# Patient Record
Sex: Female | Born: 1935 | Race: White | Hispanic: No | Smoking: Former smoker
Health system: Southern US, Community
[De-identification: ages and names within clinical notes are randomized; demographics above are authoritative.]

## PROBLEM LIST (undated history)

## (undated) DIAGNOSIS — I429 Cardiomyopathy, unspecified: Secondary | ICD-10-CM

## (undated) DIAGNOSIS — M1990 Unspecified osteoarthritis, unspecified site: Secondary | ICD-10-CM

## (undated) DIAGNOSIS — Z9289 Personal history of other medical treatment: Secondary | ICD-10-CM

## (undated) DIAGNOSIS — M545 Low back pain, unspecified: Secondary | ICD-10-CM

## (undated) DIAGNOSIS — I10 Essential (primary) hypertension: Secondary | ICD-10-CM

## (undated) DIAGNOSIS — G5600 Carpal tunnel syndrome, unspecified upper limb: Secondary | ICD-10-CM

## (undated) DIAGNOSIS — F329 Major depressive disorder, single episode, unspecified: Secondary | ICD-10-CM

## (undated) DIAGNOSIS — S329XXA Fracture of unspecified parts of lumbosacral spine and pelvis, initial encounter for closed fracture: Secondary | ICD-10-CM

## (undated) DIAGNOSIS — E785 Hyperlipidemia, unspecified: Secondary | ICD-10-CM

## (undated) DIAGNOSIS — M810 Age-related osteoporosis without current pathological fracture: Secondary | ICD-10-CM

## (undated) DIAGNOSIS — D649 Anemia, unspecified: Secondary | ICD-10-CM

## (undated) DIAGNOSIS — S72009A Fracture of unspecified part of neck of unspecified femur, initial encounter for closed fracture: Secondary | ICD-10-CM

## (undated) DIAGNOSIS — I2510 Atherosclerotic heart disease of native coronary artery without angina pectoris: Secondary | ICD-10-CM

## (undated) DIAGNOSIS — I5020 Unspecified systolic (congestive) heart failure: Secondary | ICD-10-CM

## (undated) DIAGNOSIS — F3289 Other specified depressive episodes: Secondary | ICD-10-CM

## (undated) DIAGNOSIS — M4800 Spinal stenosis, site unspecified: Secondary | ICD-10-CM

## (undated) DIAGNOSIS — I4891 Unspecified atrial fibrillation: Secondary | ICD-10-CM

## (undated) DIAGNOSIS — J309 Allergic rhinitis, unspecified: Secondary | ICD-10-CM

## (undated) DIAGNOSIS — I6529 Occlusion and stenosis of unspecified carotid artery: Secondary | ICD-10-CM

## (undated) DIAGNOSIS — K219 Gastro-esophageal reflux disease without esophagitis: Secondary | ICD-10-CM

## (undated) HISTORY — DX: Age-related osteoporosis without current pathological fracture: M81.0

## (undated) HISTORY — DX: Unspecified atrial fibrillation: I48.91

## (undated) HISTORY — DX: Anemia, unspecified: D64.9

## (undated) HISTORY — DX: Unspecified osteoarthritis, unspecified site: M19.90

## (undated) HISTORY — DX: Low back pain: M54.5

## (undated) HISTORY — DX: Other specified depressive episodes: F32.89

## (undated) HISTORY — PX: EYE SURGERY: SHX253

## (undated) HISTORY — DX: Allergic rhinitis, unspecified: J30.9

## (undated) HISTORY — DX: Fracture of unspecified parts of lumbosacral spine and pelvis, initial encounter for closed fracture: S32.9XXA

## (undated) HISTORY — DX: Cardiomyopathy, unspecified: I42.9

## (undated) HISTORY — DX: Major depressive disorder, single episode, unspecified: F32.9

## (undated) HISTORY — DX: Fracture of unspecified part of neck of unspecified femur, initial encounter for closed fracture: S72.009A

## (undated) HISTORY — DX: Atherosclerotic heart disease of native coronary artery without angina pectoris: I25.10

## (undated) HISTORY — PX: VEIN LIGATION: SHX2652

## (undated) HISTORY — DX: Personal history of other medical treatment: Z92.89

## (undated) HISTORY — DX: Low back pain, unspecified: M54.50

## (undated) HISTORY — DX: Carpal tunnel syndrome, unspecified upper limb: G56.00

## (undated) HISTORY — DX: Gastro-esophageal reflux disease without esophagitis: K21.9

## (undated) HISTORY — DX: Spinal stenosis, site unspecified: M48.00

## (undated) HISTORY — DX: Hyperlipidemia, unspecified: E78.5

## (undated) HISTORY — DX: Occlusion and stenosis of unspecified carotid artery: I65.29

## (undated) HISTORY — PX: COLONOSCOPY: SHX174

## (undated) HISTORY — DX: Unspecified systolic (congestive) heart failure: I50.20

---
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
## 2002-07-10 DIAGNOSIS — I2510 Atherosclerotic heart disease of native coronary artery without angina pectoris: Secondary | ICD-10-CM

## 2002-07-10 HISTORY — PX: CORONARY ARTERY BYPASS GRAFT: SHX141

## 2002-07-10 HISTORY — DX: Atherosclerotic heart disease of native coronary artery without angina pectoris: I25.10

## 2002-07-10 HISTORY — PX: CARPAL TUNNEL RELEASE: SHX101

## 2002-07-14 ENCOUNTER — Encounter: Admission: RE | Admit: 2002-07-14 | Discharge: 2002-07-14 | Payer: Self-pay | Admitting: Orthopedic Surgery

## 2002-07-14 ENCOUNTER — Encounter: Payer: Self-pay | Admitting: Orthopedic Surgery

## 2002-07-15 ENCOUNTER — Ambulatory Visit (HOSPITAL_BASED_OUTPATIENT_CLINIC_OR_DEPARTMENT_OTHER): Admission: RE | Admit: 2002-07-15 | Discharge: 2002-07-15 | Payer: Self-pay | Admitting: Orthopedic Surgery

## 2002-08-19 ENCOUNTER — Ambulatory Visit (HOSPITAL_BASED_OUTPATIENT_CLINIC_OR_DEPARTMENT_OTHER): Admission: RE | Admit: 2002-08-19 | Discharge: 2002-08-19 | Payer: Self-pay | Admitting: Orthopedic Surgery

## 2003-01-22 ENCOUNTER — Encounter: Payer: Self-pay | Admitting: *Deleted

## 2003-01-22 ENCOUNTER — Inpatient Hospital Stay (HOSPITAL_COMMUNITY): Admission: EM | Admit: 2003-01-22 | Discharge: 2003-02-01 | Payer: Self-pay | Admitting: Emergency Medicine

## 2003-01-24 ENCOUNTER — Encounter: Payer: Self-pay | Admitting: *Deleted

## 2003-01-27 ENCOUNTER — Encounter: Payer: Self-pay | Admitting: Thoracic Surgery (Cardiothoracic Vascular Surgery)

## 2003-01-28 ENCOUNTER — Encounter: Payer: Self-pay | Admitting: Thoracic Surgery (Cardiothoracic Vascular Surgery)

## 2003-01-29 ENCOUNTER — Encounter: Payer: Self-pay | Admitting: Thoracic Surgery (Cardiothoracic Vascular Surgery)

## 2003-01-30 ENCOUNTER — Encounter: Payer: Self-pay | Admitting: Thoracic Surgery (Cardiothoracic Vascular Surgery)

## 2003-02-16 ENCOUNTER — Encounter: Payer: Self-pay | Admitting: Thoracic Surgery (Cardiothoracic Vascular Surgery)

## 2003-02-16 ENCOUNTER — Encounter
Admission: RE | Admit: 2003-02-16 | Discharge: 2003-02-16 | Payer: Self-pay | Admitting: Thoracic Surgery (Cardiothoracic Vascular Surgery)

## 2003-03-11 ENCOUNTER — Encounter (HOSPITAL_COMMUNITY): Admission: RE | Admit: 2003-03-11 | Discharge: 2003-06-09 | Payer: Self-pay | Admitting: Emergency Medicine

## 2003-06-10 ENCOUNTER — Encounter (HOSPITAL_COMMUNITY): Admission: RE | Admit: 2003-06-10 | Discharge: 2003-07-11 | Payer: Self-pay | Admitting: Cardiology

## 2003-07-20 ENCOUNTER — Encounter (HOSPITAL_COMMUNITY): Admission: RE | Admit: 2003-07-20 | Discharge: 2003-10-18 | Payer: Self-pay | Admitting: Cardiology

## 2003-11-10 ENCOUNTER — Encounter (HOSPITAL_COMMUNITY): Admission: RE | Admit: 2003-11-10 | Discharge: 2004-02-08 | Payer: Self-pay | Admitting: Cardiology

## 2004-02-09 ENCOUNTER — Encounter (HOSPITAL_COMMUNITY): Admission: RE | Admit: 2004-02-09 | Discharge: 2004-05-09 | Payer: Self-pay | Admitting: Cardiology

## 2004-02-18 ENCOUNTER — Encounter: Admission: RE | Admit: 2004-02-18 | Discharge: 2004-02-18 | Payer: Self-pay | Admitting: Orthopedic Surgery

## 2004-05-10 ENCOUNTER — Encounter (HOSPITAL_COMMUNITY): Admission: RE | Admit: 2004-05-10 | Discharge: 2004-08-08 | Payer: Self-pay | Admitting: Cardiology

## 2004-08-10 ENCOUNTER — Encounter (HOSPITAL_COMMUNITY): Admission: RE | Admit: 2004-08-10 | Discharge: 2004-11-08 | Payer: Self-pay | Admitting: Cardiology

## 2004-11-09 ENCOUNTER — Encounter (HOSPITAL_COMMUNITY): Admission: RE | Admit: 2004-11-09 | Discharge: 2005-02-07 | Payer: Self-pay | Admitting: Cardiology

## 2005-02-08 ENCOUNTER — Encounter (HOSPITAL_COMMUNITY): Admission: RE | Admit: 2005-02-08 | Discharge: 2005-05-09 | Payer: Self-pay | Admitting: Cardiology

## 2005-05-10 ENCOUNTER — Encounter (HOSPITAL_COMMUNITY): Admission: RE | Admit: 2005-05-10 | Discharge: 2005-08-08 | Payer: Self-pay | Admitting: Cardiology

## 2005-08-10 ENCOUNTER — Encounter (HOSPITAL_COMMUNITY): Admission: RE | Admit: 2005-08-10 | Discharge: 2005-11-08 | Payer: Self-pay | Admitting: Cardiology

## 2005-11-09 ENCOUNTER — Encounter (HOSPITAL_COMMUNITY): Admission: RE | Admit: 2005-11-09 | Discharge: 2006-02-07 | Payer: Self-pay | Admitting: Cardiology

## 2005-12-28 ENCOUNTER — Ambulatory Visit: Admission: RE | Admit: 2005-12-28 | Discharge: 2005-12-28 | Payer: Self-pay | Admitting: Cardiology

## 2006-02-08 ENCOUNTER — Encounter (HOSPITAL_COMMUNITY): Admission: RE | Admit: 2006-02-08 | Discharge: 2006-05-09 | Payer: Self-pay | Admitting: Cardiology

## 2006-05-10 ENCOUNTER — Encounter (HOSPITAL_COMMUNITY): Admission: RE | Admit: 2006-05-10 | Discharge: 2006-08-08 | Payer: Self-pay | Admitting: Cardiology

## 2006-08-10 ENCOUNTER — Encounter (HOSPITAL_COMMUNITY): Admission: RE | Admit: 2006-08-10 | Discharge: 2006-11-08 | Payer: Self-pay | Admitting: Cardiology

## 2006-11-09 ENCOUNTER — Encounter (HOSPITAL_COMMUNITY): Admission: RE | Admit: 2006-11-09 | Discharge: 2007-02-07 | Payer: Self-pay | Admitting: Cardiology

## 2007-07-11 HISTORY — PX: ULNAR NERVE TRANSPOSITION: SHX2595

## 2007-10-17 ENCOUNTER — Ambulatory Visit (HOSPITAL_BASED_OUTPATIENT_CLINIC_OR_DEPARTMENT_OTHER): Admission: RE | Admit: 2007-10-17 | Discharge: 2007-10-17 | Payer: Self-pay | Admitting: Orthopedic Surgery

## 2007-11-11 ENCOUNTER — Encounter: Admission: RE | Admit: 2007-11-11 | Discharge: 2007-11-11 | Payer: Self-pay | Admitting: Internal Medicine

## 2008-03-30 ENCOUNTER — Inpatient Hospital Stay (HOSPITAL_COMMUNITY): Admission: EM | Admit: 2008-03-30 | Discharge: 2008-03-31 | Payer: Self-pay | Admitting: Emergency Medicine

## 2008-03-30 ENCOUNTER — Encounter: Payer: Self-pay | Admitting: Internal Medicine

## 2008-03-30 LAB — CONVERTED CEMR LAB
AST: 23 units/L
Alkaline Phosphatase: 47 units/L
CO2: 23 meq/L
Chloride: 102 meq/L
Potassium: 3.7 meq/L
Sodium: 134 meq/L
Total Bilirubin: 0.9 mg/dL

## 2009-07-14 ENCOUNTER — Encounter: Payer: Self-pay | Admitting: Internal Medicine

## 2009-07-14 DIAGNOSIS — I499 Cardiac arrhythmia, unspecified: Secondary | ICD-10-CM | POA: Insufficient documentation

## 2009-07-14 DIAGNOSIS — E785 Hyperlipidemia, unspecified: Secondary | ICD-10-CM | POA: Insufficient documentation

## 2009-07-14 DIAGNOSIS — I10 Essential (primary) hypertension: Secondary | ICD-10-CM

## 2009-07-14 DIAGNOSIS — M4800 Spinal stenosis, site unspecified: Secondary | ICD-10-CM | POA: Insufficient documentation

## 2009-07-14 DIAGNOSIS — I2510 Atherosclerotic heart disease of native coronary artery without angina pectoris: Secondary | ICD-10-CM | POA: Insufficient documentation

## 2009-07-15 ENCOUNTER — Ambulatory Visit: Payer: Self-pay | Admitting: Internal Medicine

## 2009-07-15 DIAGNOSIS — M129 Arthropathy, unspecified: Secondary | ICD-10-CM | POA: Insufficient documentation

## 2009-07-15 DIAGNOSIS — R011 Cardiac murmur, unspecified: Secondary | ICD-10-CM | POA: Insufficient documentation

## 2009-07-15 DIAGNOSIS — J309 Allergic rhinitis, unspecified: Secondary | ICD-10-CM | POA: Insufficient documentation

## 2009-07-15 DIAGNOSIS — F329 Major depressive disorder, single episode, unspecified: Secondary | ICD-10-CM

## 2009-07-15 DIAGNOSIS — K219 Gastro-esophageal reflux disease without esophagitis: Secondary | ICD-10-CM

## 2009-07-15 DIAGNOSIS — I868 Varicose veins of other specified sites: Secondary | ICD-10-CM | POA: Insufficient documentation

## 2009-07-15 DIAGNOSIS — G5600 Carpal tunnel syndrome, unspecified upper limb: Secondary | ICD-10-CM

## 2009-07-26 ENCOUNTER — Encounter: Payer: Self-pay | Admitting: Internal Medicine

## 2009-08-02 ENCOUNTER — Encounter: Payer: Self-pay | Admitting: Internal Medicine

## 2009-08-13 ENCOUNTER — Telehealth (INDEPENDENT_AMBULATORY_CARE_PROVIDER_SITE_OTHER): Payer: Self-pay | Admitting: *Deleted

## 2009-08-13 ENCOUNTER — Ambulatory Visit: Payer: Self-pay | Admitting: Internal Medicine

## 2009-08-25 ENCOUNTER — Ambulatory Visit: Payer: Self-pay | Admitting: Internal Medicine

## 2009-08-28 ENCOUNTER — Emergency Department (HOSPITAL_COMMUNITY): Admission: EM | Admit: 2009-08-28 | Discharge: 2009-08-28 | Payer: Self-pay | Admitting: Emergency Medicine

## 2009-08-30 ENCOUNTER — Telehealth: Payer: Self-pay | Admitting: Internal Medicine

## 2009-09-02 ENCOUNTER — Ambulatory Visit: Payer: Self-pay | Admitting: Internal Medicine

## 2009-09-04 ENCOUNTER — Encounter: Payer: Self-pay | Admitting: Internal Medicine

## 2009-09-07 DIAGNOSIS — S72009A Fracture of unspecified part of neck of unspecified femur, initial encounter for closed fracture: Secondary | ICD-10-CM

## 2009-09-07 HISTORY — PX: OTHER SURGICAL HISTORY: SHX169

## 2009-09-07 HISTORY — DX: Fracture of unspecified part of neck of unspecified femur, initial encounter for closed fracture: S72.009A

## 2009-09-08 ENCOUNTER — Telehealth: Payer: Self-pay | Admitting: Internal Medicine

## 2009-09-13 ENCOUNTER — Telehealth: Payer: Self-pay | Admitting: Internal Medicine

## 2009-09-20 ENCOUNTER — Ambulatory Visit: Payer: Self-pay | Admitting: Internal Medicine

## 2009-09-27 ENCOUNTER — Ambulatory Visit: Payer: Self-pay | Admitting: Internal Medicine

## 2009-09-27 DIAGNOSIS — R1084 Generalized abdominal pain: Secondary | ICD-10-CM

## 2009-09-27 LAB — CONVERTED CEMR LAB
ALT: 231 units/L — ABNORMAL HIGH (ref 0–35)
AST: 41 units/L
AST: 79 units/L — ABNORMAL HIGH (ref 0–37)
Albumin: 2.9 g/dL — ABNORMAL LOW (ref 3.5–5.2)
Alkaline Phosphatase: 246 units/L
BUN: 24 mg/dL
Basophils Relative: 0.5 % (ref 0.0–3.0)
Calcium: 8.7 mg/dL
Calcium: 9.6 mg/dL (ref 8.4–10.5)
Chloride: 107 meq/L (ref 96–112)
Creatinine, Ser: 0.5 mg/dL
Eosinophils Absolute: 0.2 10*3/uL (ref 0.0–0.7)
Eosinophils Relative: 0 %
Eosinophils Relative: 3.3 % (ref 0.0–5.0)
Glucose, Bld: 108 mg/dL
Ketones, ur: NEGATIVE mg/dL
Leukocytes, UA: NEGATIVE
Lipase: 8 units/L — ABNORMAL LOW (ref 11.0–59.0)
Lymphocytes Relative: 13 % (ref 12.0–46.0)
Lymphocytes, automated: 5 %
Lymphs Abs: 0.6 10*3/uL — ABNORMAL LOW (ref 0.7–4.0)
MCHC: 33.1 g/dL (ref 30.0–36.0)
MCV: 98.9 fL (ref 78.0–100.0)
Monocytes Relative: 6 %
Neutro Abs: 4 10*3/uL (ref 1.4–7.7)
Neutrophils Relative %: 90 %
Platelets: 247 10*3/uL (ref 150.0–400.0)
Potassium: 5.1 meq/L (ref 3.5–5.1)
RBC: 3.77 M/uL
Specific Gravity, Urine: 1.02 (ref 1.000–1.030)
Total Protein, Urine: NEGATIVE mg/dL
Total Protein: 6.7 g/dL (ref 6.0–8.3)
Urine Glucose: NEGATIVE mg/dL
WBC: 20.6 10*3/uL
WBC: 5 10*3/uL (ref 4.5–10.5)
pH: 6 (ref 5.0–8.0)

## 2009-09-29 ENCOUNTER — Ambulatory Visit: Payer: Self-pay | Admitting: Internal Medicine

## 2009-10-01 ENCOUNTER — Encounter: Admission: RE | Admit: 2009-10-01 | Discharge: 2009-10-01 | Payer: Self-pay | Admitting: Internal Medicine

## 2009-10-01 DIAGNOSIS — R740 Nonspecific elevation of levels of transaminase and lactic acid dehydrogenase [LDH]: Secondary | ICD-10-CM

## 2009-10-03 ENCOUNTER — Inpatient Hospital Stay (HOSPITAL_COMMUNITY): Admission: EM | Admit: 2009-10-03 | Discharge: 2009-10-08 | Payer: Self-pay | Admitting: Emergency Medicine

## 2009-10-03 DIAGNOSIS — S72009A Fracture of unspecified part of neck of unspecified femur, initial encounter for closed fracture: Secondary | ICD-10-CM | POA: Insufficient documentation

## 2009-10-04 ENCOUNTER — Telehealth (INDEPENDENT_AMBULATORY_CARE_PROVIDER_SITE_OTHER): Payer: Self-pay | Admitting: *Deleted

## 2009-10-05 ENCOUNTER — Ambulatory Visit: Payer: Self-pay | Admitting: Gastroenterology

## 2009-10-05 LAB — CONVERTED CEMR LAB: Hgb A1c MFr Bld: 5.7 %

## 2009-10-06 ENCOUNTER — Encounter: Payer: Self-pay | Admitting: Internal Medicine

## 2009-10-06 LAB — CONVERTED CEMR LAB
Albumin: 1.9 g/dL
Alkaline Phosphatase: 133 units/L
BUN: 14 mg/dL
CO2: 27 meq/L
Chloride: 103 meq/L
HCT: 25.6 %
Platelets: 285 10*3/uL
Sodium: 135 meq/L
Total Protein: 5 g/dL

## 2009-10-07 ENCOUNTER — Telehealth (INDEPENDENT_AMBULATORY_CARE_PROVIDER_SITE_OTHER): Payer: Self-pay | Admitting: *Deleted

## 2009-10-07 ENCOUNTER — Encounter: Payer: Self-pay | Admitting: Gastroenterology

## 2009-10-08 ENCOUNTER — Encounter: Payer: Self-pay | Admitting: Internal Medicine

## 2009-10-08 LAB — CONVERTED CEMR LAB
Albumin: 1.8 g/dL
CO2: 28 meq/L
Calcium: 8 mg/dL
Chloride: 102 meq/L
Creatinine, Ser: 0.56 mg/dL
HCT: 24.3 %
Hemoglobin: 8.2 g/dL
Potassium: 4 meq/L
Sodium: 135 meq/L
Total Bilirubin: 0.6 mg/dL
Total Protein: 4.6 g/dL

## 2009-10-13 ENCOUNTER — Telehealth: Payer: Self-pay | Admitting: Internal Medicine

## 2009-11-26 ENCOUNTER — Telehealth (INDEPENDENT_AMBULATORY_CARE_PROVIDER_SITE_OTHER): Payer: Self-pay | Admitting: *Deleted

## 2009-12-02 ENCOUNTER — Encounter: Payer: Self-pay | Admitting: Internal Medicine

## 2009-12-03 ENCOUNTER — Encounter: Payer: Self-pay | Admitting: Internal Medicine

## 2009-12-03 ENCOUNTER — Ambulatory Visit: Payer: Self-pay | Admitting: Internal Medicine

## 2009-12-08 DIAGNOSIS — S329XXA Fracture of unspecified parts of lumbosacral spine and pelvis, initial encounter for closed fracture: Secondary | ICD-10-CM

## 2009-12-08 HISTORY — DX: Fracture of unspecified parts of lumbosacral spine and pelvis, initial encounter for closed fracture: S32.9XXA

## 2009-12-10 ENCOUNTER — Encounter: Payer: Self-pay | Admitting: Internal Medicine

## 2009-12-17 ENCOUNTER — Telehealth: Payer: Self-pay | Admitting: Internal Medicine

## 2009-12-23 ENCOUNTER — Ambulatory Visit: Payer: Self-pay | Admitting: Internal Medicine

## 2009-12-30 ENCOUNTER — Ambulatory Visit: Payer: Self-pay | Admitting: Internal Medicine

## 2009-12-31 ENCOUNTER — Emergency Department (HOSPITAL_COMMUNITY): Admission: EM | Admit: 2009-12-31 | Discharge: 2009-12-31 | Payer: Self-pay | Admitting: Emergency Medicine

## 2010-01-02 ENCOUNTER — Emergency Department (HOSPITAL_COMMUNITY): Admission: EM | Admit: 2010-01-02 | Discharge: 2010-01-02 | Payer: Self-pay | Admitting: Emergency Medicine

## 2010-01-02 ENCOUNTER — Encounter: Payer: Self-pay | Admitting: Internal Medicine

## 2010-01-02 LAB — CONVERTED CEMR LAB
BUN: 19 mg/dL
CO2: 25 meq/L
Chloride: 103 meq/L
Creatinine, Ser: 0.6 mg/dL
Eosinophils Relative: 0 %
Glucose, Bld: 115 mg/dL
HCT: 34.2 %
Hemoglobin: 11.8 g/dL
Lymphocytes, automated: 10 %
Platelets: 206 10*3/uL
Potassium: 4 meq/L
RBC: 3.71 M/uL
RDW: 14.6 %

## 2010-01-03 ENCOUNTER — Telehealth: Payer: Self-pay | Admitting: Internal Medicine

## 2010-01-03 ENCOUNTER — Ambulatory Visit: Payer: Self-pay | Admitting: Internal Medicine

## 2010-01-03 DIAGNOSIS — S329XXA Fracture of unspecified parts of lumbosacral spine and pelvis, initial encounter for closed fracture: Secondary | ICD-10-CM | POA: Insufficient documentation

## 2010-01-03 DIAGNOSIS — B029 Zoster without complications: Secondary | ICD-10-CM | POA: Insufficient documentation

## 2010-01-04 ENCOUNTER — Telehealth: Payer: Self-pay | Admitting: Internal Medicine

## 2010-01-05 ENCOUNTER — Encounter: Payer: Self-pay | Admitting: Internal Medicine

## 2010-01-05 ENCOUNTER — Telehealth: Payer: Self-pay | Admitting: Internal Medicine

## 2010-01-06 ENCOUNTER — Telehealth: Payer: Self-pay | Admitting: Internal Medicine

## 2010-01-07 ENCOUNTER — Encounter: Payer: Self-pay | Admitting: Internal Medicine

## 2010-01-07 ENCOUNTER — Telehealth: Payer: Self-pay | Admitting: Internal Medicine

## 2010-01-11 ENCOUNTER — Telehealth: Payer: Self-pay | Admitting: Internal Medicine

## 2010-01-12 ENCOUNTER — Encounter: Payer: Self-pay | Admitting: Internal Medicine

## 2010-01-13 ENCOUNTER — Ambulatory Visit: Payer: Self-pay | Admitting: Internal Medicine

## 2010-01-17 ENCOUNTER — Ambulatory Visit: Payer: Self-pay | Admitting: Internal Medicine

## 2010-01-17 DIAGNOSIS — L03119 Cellulitis of unspecified part of limb: Secondary | ICD-10-CM

## 2010-01-17 DIAGNOSIS — L02419 Cutaneous abscess of limb, unspecified: Secondary | ICD-10-CM

## 2010-01-17 DIAGNOSIS — R609 Edema, unspecified: Secondary | ICD-10-CM

## 2010-01-18 ENCOUNTER — Telehealth: Payer: Self-pay | Admitting: Internal Medicine

## 2010-01-18 ENCOUNTER — Encounter: Payer: Self-pay | Admitting: Internal Medicine

## 2010-01-19 ENCOUNTER — Encounter (INDEPENDENT_AMBULATORY_CARE_PROVIDER_SITE_OTHER): Payer: Self-pay | Admitting: *Deleted

## 2010-01-19 ENCOUNTER — Encounter: Payer: Self-pay | Admitting: Internal Medicine

## 2010-01-27 ENCOUNTER — Telehealth: Payer: Self-pay | Admitting: Internal Medicine

## 2010-02-09 ENCOUNTER — Ambulatory Visit: Payer: Self-pay | Admitting: Internal Medicine

## 2010-02-16 ENCOUNTER — Encounter: Payer: Self-pay | Admitting: Internal Medicine

## 2010-03-04 ENCOUNTER — Ambulatory Visit: Payer: Self-pay | Admitting: Internal Medicine

## 2010-03-06 ENCOUNTER — Encounter: Payer: Self-pay | Admitting: Internal Medicine

## 2010-03-06 ENCOUNTER — Inpatient Hospital Stay (HOSPITAL_COMMUNITY): Admission: EM | Admit: 2010-03-06 | Discharge: 2010-03-08 | Payer: Self-pay | Admitting: Emergency Medicine

## 2010-03-06 LAB — CONVERTED CEMR LAB
BUN: 18 mg/dL
CO2: 29 meq/L
Chloride: 103 meq/L
Glucose, Bld: 154 mg/dL
Lymphocytes, automated: 20 %
MCV: 91.9 fL
Monocytes Relative: 9 %
Neutrophils Relative %: 68 %
Potassium: 4 meq/L
RDW: 14.1 %
Sodium: 136 meq/L

## 2010-03-07 ENCOUNTER — Encounter: Payer: Self-pay | Admitting: Internal Medicine

## 2010-03-07 ENCOUNTER — Encounter (INDEPENDENT_AMBULATORY_CARE_PROVIDER_SITE_OTHER): Payer: Self-pay | Admitting: Internal Medicine

## 2010-03-07 ENCOUNTER — Ambulatory Visit: Payer: Self-pay | Admitting: Vascular Surgery

## 2010-03-07 LAB — CONVERTED CEMR LAB: TSH: 4.144 microintl units/mL

## 2010-03-08 ENCOUNTER — Telehealth: Payer: Self-pay | Admitting: Internal Medicine

## 2010-03-08 ENCOUNTER — Encounter: Payer: Self-pay | Admitting: Internal Medicine

## 2010-03-08 LAB — CONVERTED CEMR LAB
BUN: 24 mg/dL
Creatinine, Ser: 0.73 mg/dL
Glucose, Bld: 107 mg/dL

## 2010-03-09 ENCOUNTER — Telehealth: Payer: Self-pay | Admitting: Internal Medicine

## 2010-03-10 ENCOUNTER — Telehealth: Payer: Self-pay | Admitting: Internal Medicine

## 2010-03-11 ENCOUNTER — Encounter: Payer: Self-pay | Admitting: Internal Medicine

## 2010-03-17 ENCOUNTER — Encounter: Payer: Self-pay | Admitting: Internal Medicine

## 2010-03-17 DIAGNOSIS — M545 Low back pain, unspecified: Secondary | ICD-10-CM | POA: Insufficient documentation

## 2010-03-17 DIAGNOSIS — I509 Heart failure, unspecified: Secondary | ICD-10-CM | POA: Insufficient documentation

## 2010-03-18 ENCOUNTER — Ambulatory Visit: Payer: Self-pay | Admitting: Internal Medicine

## 2010-03-26 ENCOUNTER — Encounter: Payer: Self-pay | Admitting: Internal Medicine

## 2010-03-29 ENCOUNTER — Telehealth: Payer: Self-pay | Admitting: Internal Medicine

## 2010-04-04 ENCOUNTER — Telehealth: Payer: Self-pay | Admitting: Internal Medicine

## 2010-04-08 ENCOUNTER — Encounter: Payer: Self-pay | Admitting: Internal Medicine

## 2010-04-19 ENCOUNTER — Telehealth: Payer: Self-pay | Admitting: Internal Medicine

## 2010-05-16 ENCOUNTER — Telehealth: Payer: Self-pay | Admitting: Internal Medicine

## 2010-06-07 ENCOUNTER — Encounter: Payer: Self-pay | Admitting: Internal Medicine

## 2010-06-24 ENCOUNTER — Ambulatory Visit: Payer: Self-pay | Admitting: Internal Medicine

## 2010-06-27 ENCOUNTER — Ambulatory Visit: Payer: Self-pay | Admitting: Internal Medicine

## 2010-06-27 DIAGNOSIS — J019 Acute sinusitis, unspecified: Secondary | ICD-10-CM

## 2010-07-07 ENCOUNTER — Telehealth: Payer: Self-pay | Admitting: Internal Medicine

## 2010-07-08 ENCOUNTER — Ambulatory Visit
Admission: RE | Admit: 2010-07-08 | Discharge: 2010-07-08 | Payer: Self-pay | Source: Home / Self Care | Attending: Internal Medicine | Admitting: Internal Medicine

## 2010-07-08 IMAGING — CR DG CHEST 1V PORT
1 series · 1 of 1 positions shown · non-contrast
Comparison: 08/28/2009

CLINICAL DATA: Trauma.  Pain.

PORTABLE CHEST - 1 VIEW

[AP]
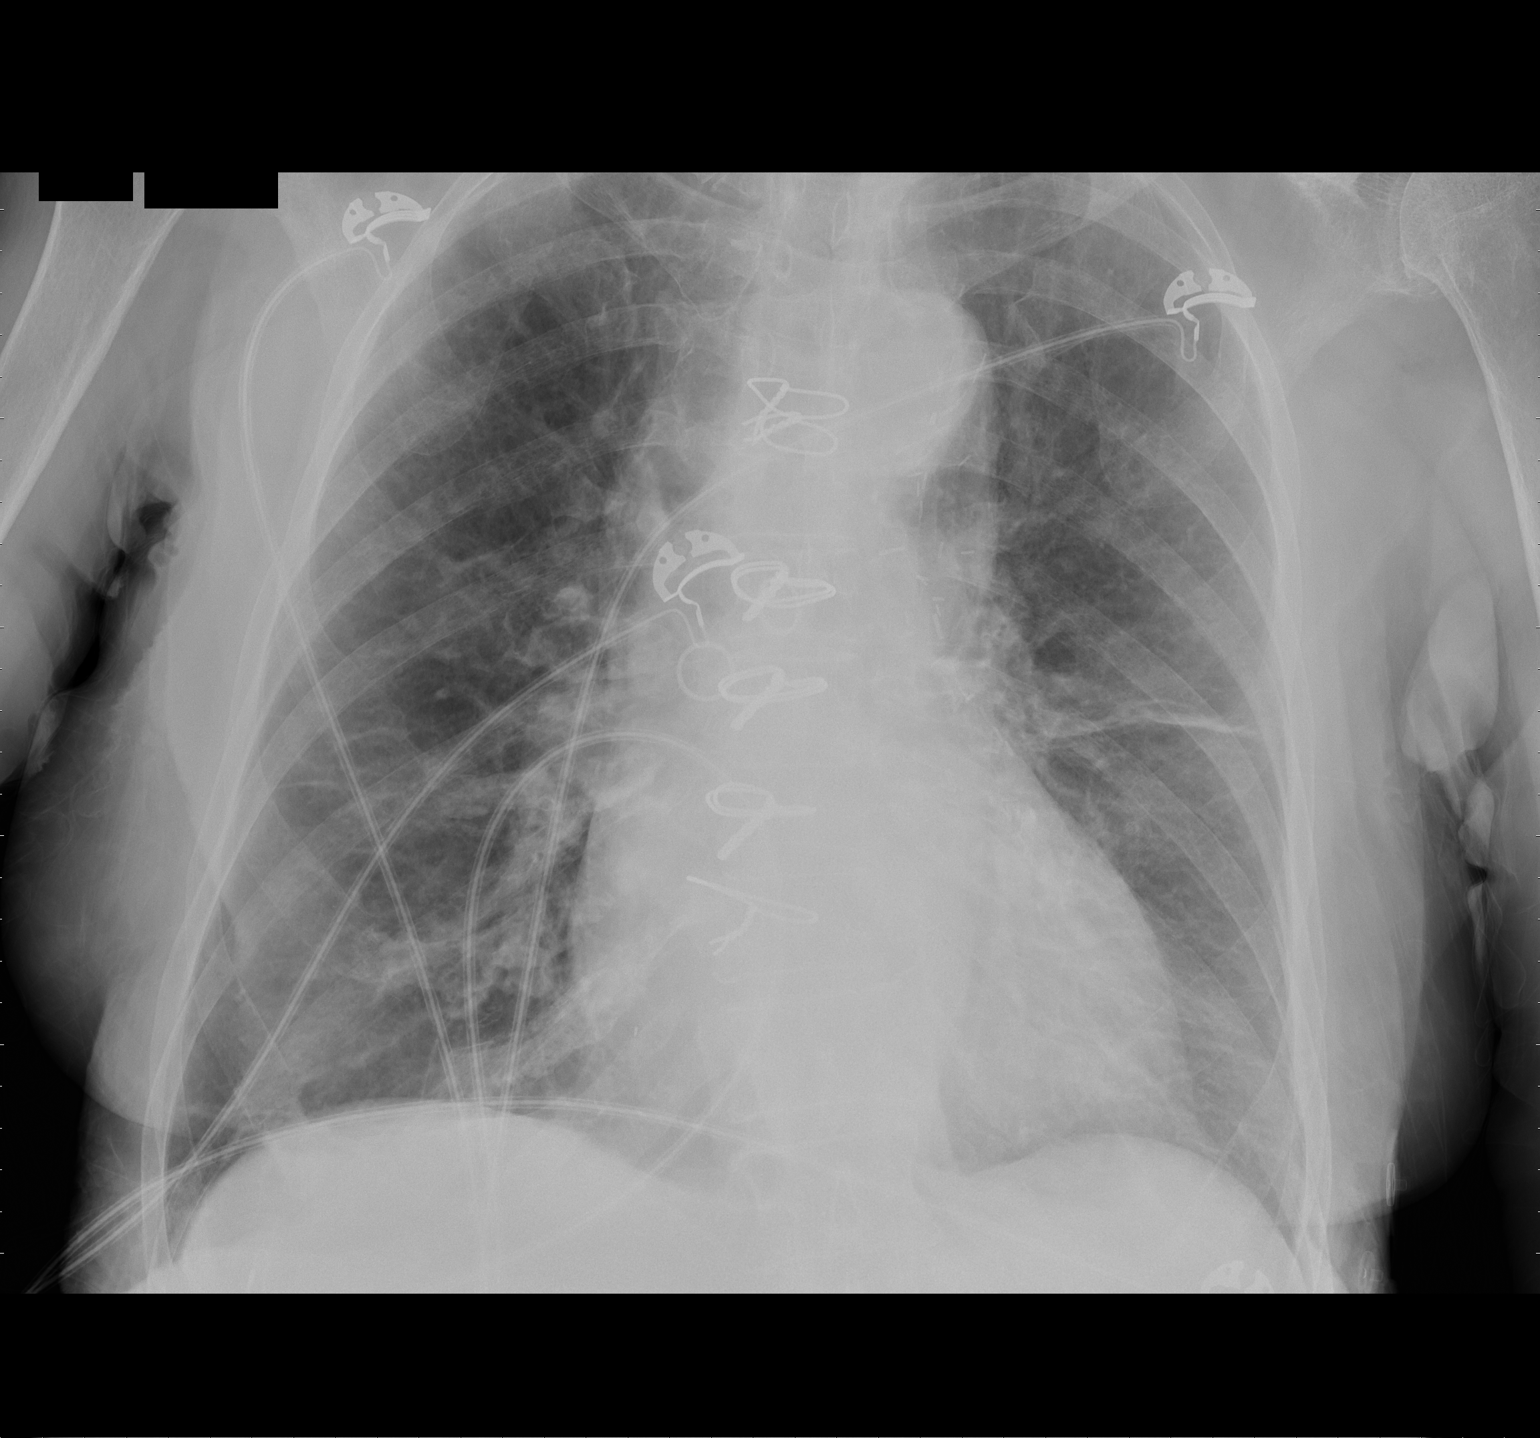

[1 of 1 positions shown; findings below may reference images not displayed]

FINDINGS: Heart size is mildly enlarged.

No pleural effusion or pulmonary edema.

Left midlung plate-like atelectasis versus scar noted.

No airspace consolidation identified.
IMPRESSION: 1.  Left midlung scar versus atelectasis.

## 2010-07-08 IMAGING — CR DG WRIST COMPLETE 3+V*L*
4 series · 4 of 4 positions shown · non-contrast
Comparison: None.

CLINICAL DATA: Fall

LEFT WRIST - COMPLETE 3+ VIEW

[x wrist pa left]
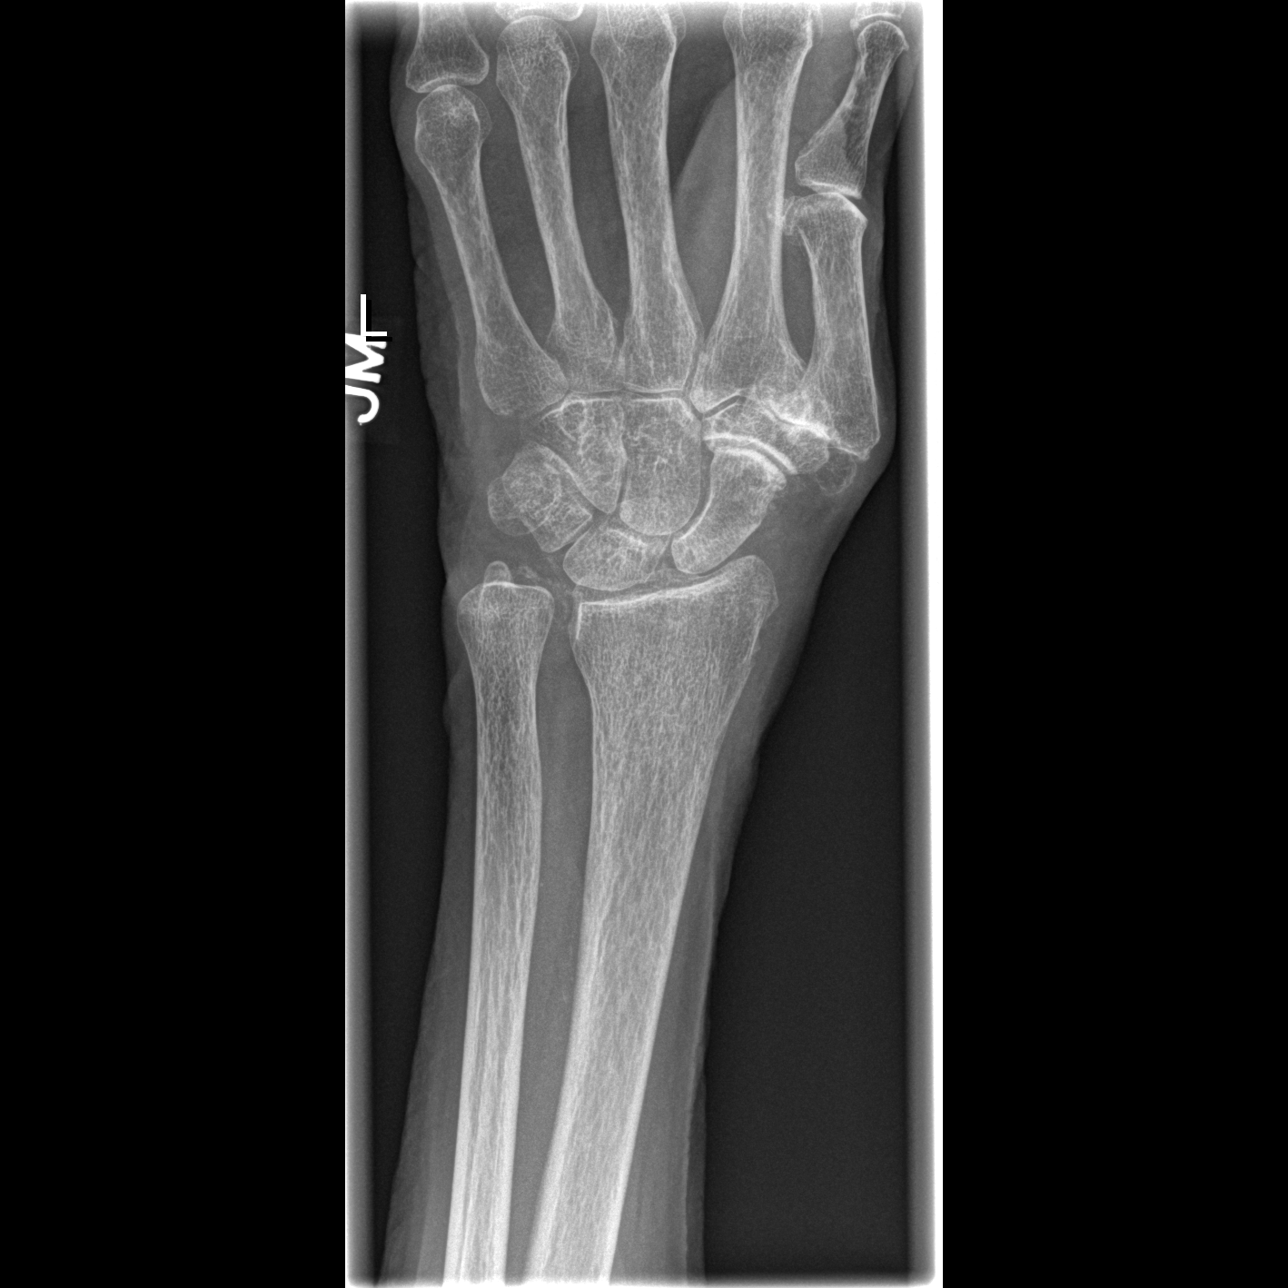

[x wrist obl left]
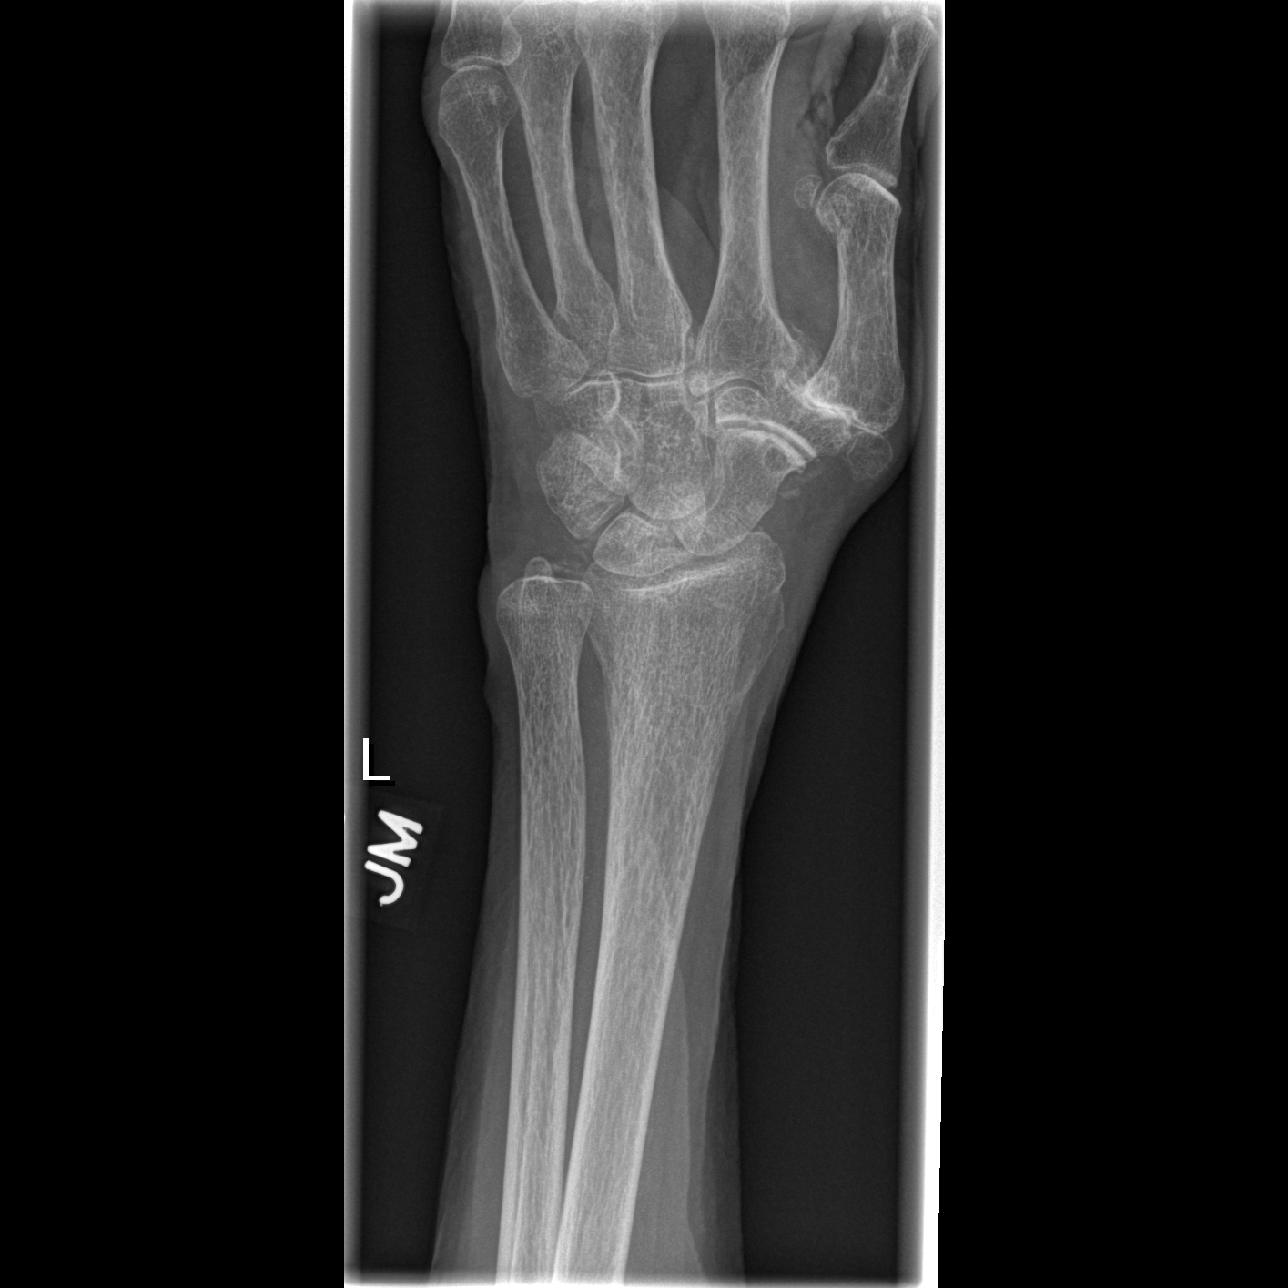

[x wrist lat left]
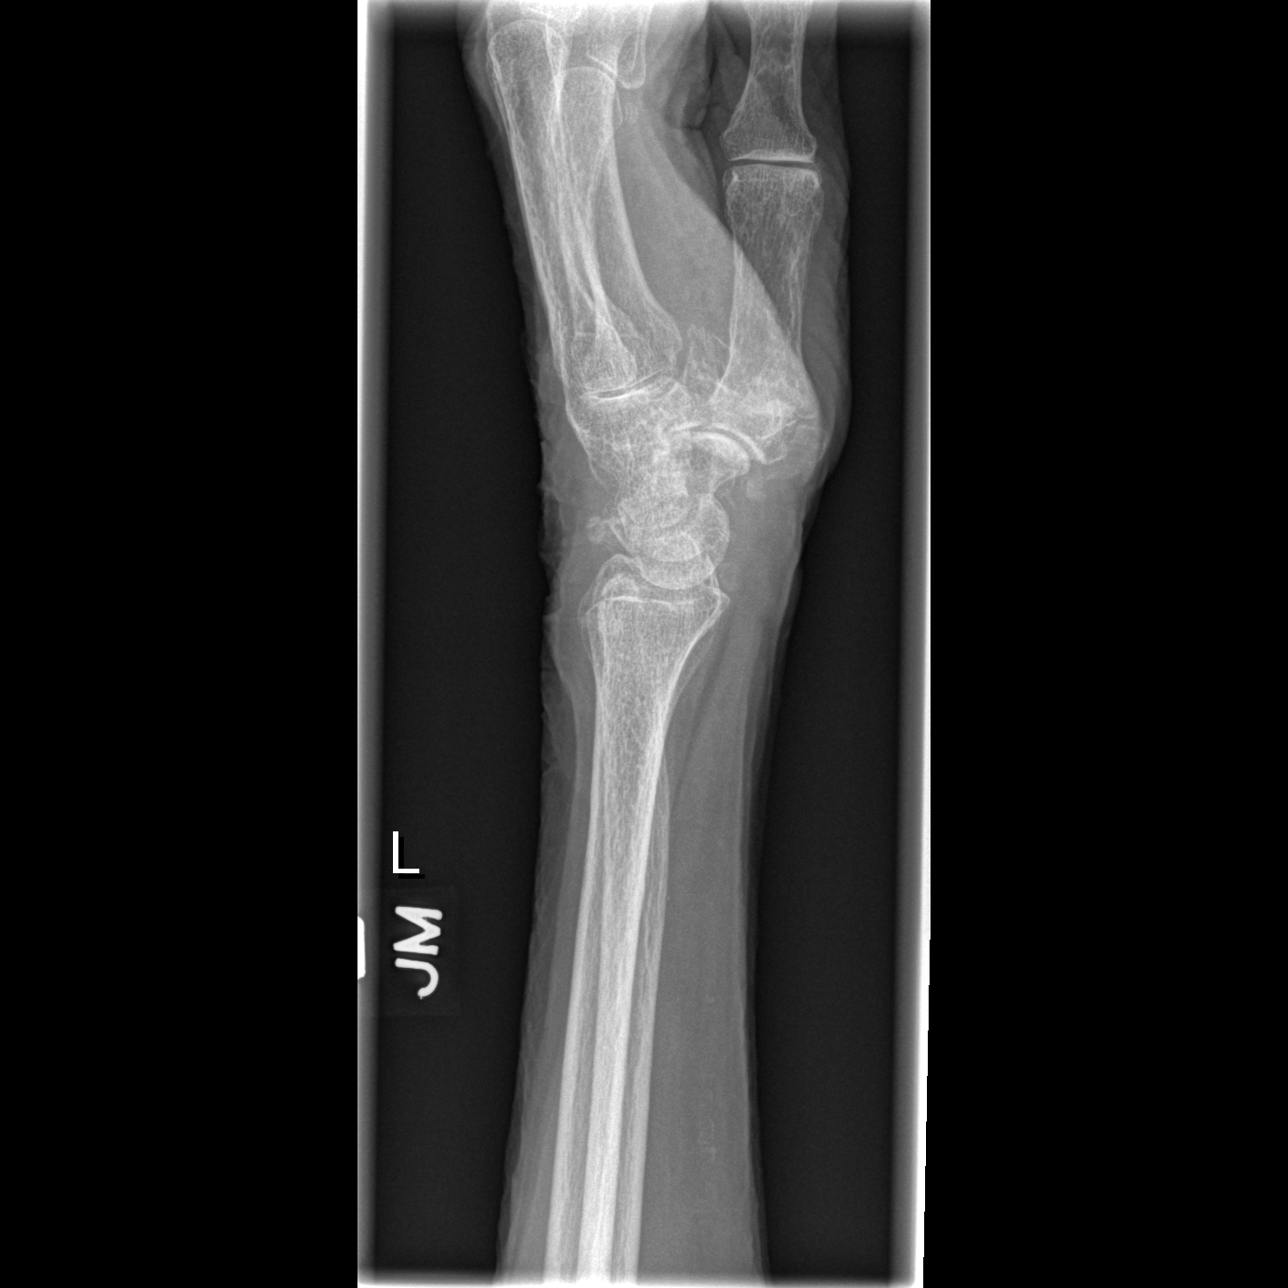

[x navicular]
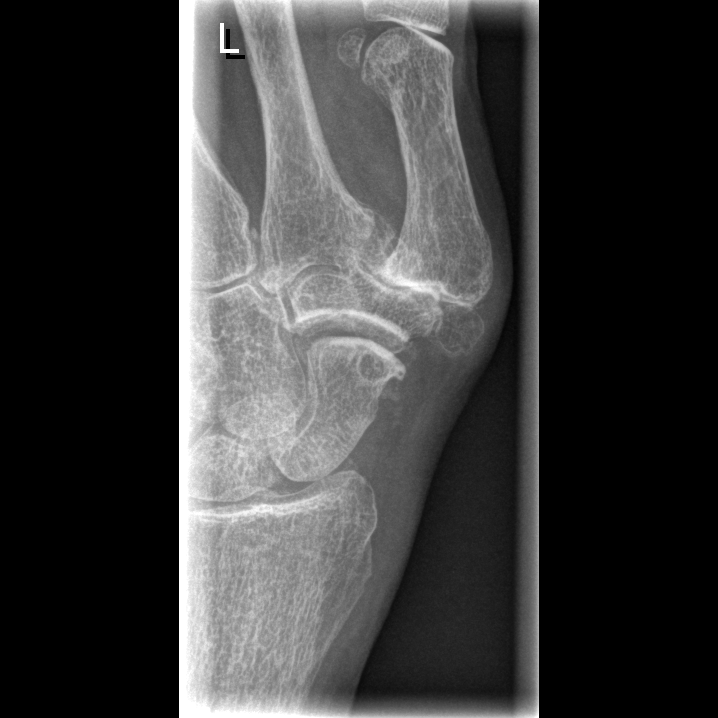

[4 of 4 positions shown; findings below may reference images not displayed]

FINDINGS: Osteopenia.  Degenerative changes at the first and second
carpal metacarpal joints.  No acute fracture.  No dislocation.
Erosions involving the scaphoid are nonspecific.
IMPRESSION: No acute bony injury.  Chronic changes.

## 2010-07-12 ENCOUNTER — Telehealth: Payer: Self-pay | Admitting: Internal Medicine

## 2010-07-28 ENCOUNTER — Telehealth: Payer: Self-pay | Admitting: Internal Medicine

## 2010-07-29 ENCOUNTER — Telehealth: Payer: Self-pay | Admitting: Internal Medicine

## 2010-07-31 ENCOUNTER — Encounter: Payer: Self-pay | Admitting: Internal Medicine

## 2010-07-31 ENCOUNTER — Encounter: Payer: Self-pay | Admitting: Orthopedic Surgery

## 2010-08-09 ENCOUNTER — Encounter: Payer: Self-pay | Admitting: Internal Medicine

## 2010-08-10 ENCOUNTER — Telehealth: Payer: Self-pay | Admitting: Internal Medicine

## 2010-08-11 ENCOUNTER — Observation Stay (HOSPITAL_COMMUNITY)
Admission: EM | Admit: 2010-08-11 | Discharge: 2010-08-12 | Disposition: A | Discharge status: Home / Self Care | Payer: Medicare Other | Attending: Cardiovascular Disease | Admitting: Cardiovascular Disease

## 2010-08-11 ENCOUNTER — Emergency Department (HOSPITAL_COMMUNITY): Payer: Medicare Other

## 2010-08-11 ENCOUNTER — Encounter: Payer: Self-pay | Admitting: Internal Medicine

## 2010-08-11 DIAGNOSIS — I10 Essential (primary) hypertension: Secondary | ICD-10-CM | POA: Insufficient documentation

## 2010-08-11 DIAGNOSIS — N390 Urinary tract infection, site not specified: Secondary | ICD-10-CM | POA: Insufficient documentation

## 2010-08-11 DIAGNOSIS — I2510 Atherosclerotic heart disease of native coronary artery without angina pectoris: Secondary | ICD-10-CM | POA: Insufficient documentation

## 2010-08-11 DIAGNOSIS — R0789 Other chest pain: Secondary | ICD-10-CM | POA: Insufficient documentation

## 2010-08-11 DIAGNOSIS — R791 Abnormal coagulation profile: Secondary | ICD-10-CM | POA: Insufficient documentation

## 2010-08-11 DIAGNOSIS — Z951 Presence of aortocoronary bypass graft: Secondary | ICD-10-CM | POA: Insufficient documentation

## 2010-08-11 DIAGNOSIS — R300 Dysuria: Secondary | ICD-10-CM | POA: Insufficient documentation

## 2010-08-11 DIAGNOSIS — E785 Hyperlipidemia, unspecified: Secondary | ICD-10-CM | POA: Insufficient documentation

## 2010-08-11 DIAGNOSIS — I498 Other specified cardiac arrhythmias: Secondary | ICD-10-CM | POA: Insufficient documentation

## 2010-08-11 DIAGNOSIS — R079 Chest pain, unspecified: Principal | ICD-10-CM | POA: Insufficient documentation

## 2010-08-11 HISTORY — DX: Essential (primary) hypertension: I10

## 2010-08-11 LAB — CBC
Hemoglobin: 13.6 g/dL (ref 12.0–15.0)
MCH: 30.6 pg (ref 26.0–34.0)
Platelets: 177 10*3/uL (ref 150–400)
RBC: 4.44 MIL/uL (ref 3.87–5.11)
WBC: 5.1 10*3/uL (ref 4.0–10.5)

## 2010-08-11 LAB — TROPONIN I: Troponin I: <0.01 ng/mL (ref 0.00–0.06)

## 2010-08-11 LAB — URINALYSIS, ROUTINE W REFLEX MICROSCOPIC
Ketones, ur: NEGATIVE mg/dL
Nitrite: POSITIVE — AB
Specific Gravity, Urine: 1.016 (ref 1.005–1.030)
Urine Glucose, Fasting: NEGATIVE mg/dL
pH: 7.5 (ref 5.0–8.0)

## 2010-08-11 LAB — CONVERTED CEMR LAB
AST: 26 units/L
Alkaline Phosphatase: 61 units/L
CO2: 28 meq/L
Calcium: 8.9 mg/dL
Glucose, Bld: 94 mg/dL
HCT: 40.8 %
MCV: 91.9 fL
RBC: 4.44 M/uL
RDW: 14.3 %
Total Bilirubin: 0.7 mg/dL
Triglyceride fasting, serum: 78 mg/dL

## 2010-08-11 LAB — COMPREHENSIVE METABOLIC PANEL
ALT: 22 U/L (ref 0–35)
AST: 26 U/L (ref 0–37)
Albumin: 3.4 g/dL — ABNORMAL LOW (ref 3.5–5.2)
Alkaline Phosphatase: 61 U/L (ref 39–117)
CO2: 28 mEq/L (ref 19–32)
Chloride: 102 mEq/L (ref 96–112)
Creatinine, Ser: 0.6 mg/dL (ref 0.4–1.2)
GFR calc Af Amer: >60 mL/min (ref >60)
GFR calc non Af Amer: >60 mL/min (ref >60)
Potassium: 3.6 mEq/L (ref 3.5–5.1)
Sodium: 138 mEq/L (ref 135–145)
Total Bilirubin: 0.7 mg/dL (ref 0.3–1.2)

## 2010-08-11 LAB — CARDIAC PANEL(CRET KIN+CKTOT+MB+TROPI)
Relative Index: INVALID (ref 0.0–2.5)
Total CK: 35 U/L (ref 7–177)

## 2010-08-11 LAB — CK TOTAL AND CKMB (NOT AT ARMC)
CK, MB: 1.8 ng/mL (ref 0.3–4.0)
Total CK: 39 U/L (ref 7–177)

## 2010-08-11 LAB — URINE MICROSCOPIC-ADD ON

## 2010-08-11 LAB — PROTIME-INR: INR: 0.91 (ref 0.00–1.49)

## 2010-08-11 NOTE — Procedures (Signed)
Summary: ERCP  Patient: Julia Black Note: All result statuses are Final unless otherwise noted.  Tests: (1) ERCP (ERC)   ERC ERCP                  DONE     [facility]     [address]     [place], [place]  [address]           ERCP PROCEDURE REPORT           PATIENT:  Julia Black, Julia Black  MR#:  [id number]     BIRTHDATE:  02-13-1919  GENDER:  female           ENDOSCOPIST:  Barbette Hair. Arlyce Dice, MD     ASSISTANT:           PROCEDURE DATE:  10/07/2009     PROCEDURE:  ERCP with sphincterotomy           INDICATIONS:  abnormal Liver Function Tests, abdominal pain,     abnormal imaging           MEDICATIONS:   MAC sedation, administered by CRNA     TOPICAL ANESTHETIC:           DESCRIPTION OF PROCEDURE:   After the risks benefits and     alternatives of the procedure were thoroughly explained, informed     consent was obtained.  The  endoscope was introduced through the     mouth and advanced to the second portion of the duodenum.           A dilatation was found. Pancreatic duct was selectively cannulated     with a 0.81mm wire. Injection demonstrated diffuse dilitation of     duct from tail to head.  A dilatation was found in the total     biliary tree. CBD was selective cannulated. There was diffuse     dilitation down to the level of the ampulla. A 12-3mm     sphincterotomy was done. Duct was swept multiple times with a 15mm     balloon stone extractor. No stones were seen. There was prompt     emptying of contrast dye following sphincterotomy.    The scope     was then completely withdrawn from the patient and the procedure     terminated.           COMPLICATIONS:  None           ENDOSCOPIC IMPRESSION:     1) Successful opacification of bile and pancreatic ducts     2) Dilatation in the total biliary tree likely secondary to     passed CBD stones     3) s/p sphincterotomy     RECOMMENDATIONS:Expectant management for recurrent abdominal pain     Resume  coumadin and     lovenox in am.           ______________________________     Barbette Hair. Arlyce Dice, MD           cc: Dr. Rene Paci           CC:           n.     eSIGNED:   Barbette Hair. Kaplan at 10/07/2009 03:18 PM           Jonah Blue, [phone]  Note: An exclamation mark (!) indicates a result that was not dispersed into the flowsheet. Document Creation Date: 10/07/2009 3:25 PM _______________________________________________________________________  (1) Order result  status: Final Collection or observation date-time: 10/07/2009 14:52 Requested date-time:  Receipt date-time:  Reported date-time:  Referring Physician:   Ordering Physician: Melvia Heaps [phone]) Specimen Source:  Source: Launa Grill Order Number: [id number] Lab site:

## 2010-08-11 NOTE — Progress Notes (Signed)
Summary: FYI  Phone Note Other Incoming   Caller: [facility] Summary of Call: HHRN called to inform MD that pt will start [facility] two times per week for the next few weeks for wound care and medication management. Initial call taken by: Margaret Pyle, CMA,  January 06, 2010 3:11 PM  Follow-up for Phone Call        ok Follow-up by: Newt Lukes MD,  January 07, 2010 4:45 PM

## 2010-08-11 NOTE — Miscellaneous (Signed)
Summary: Plan of Care & Treatment/[facility]  Plan of Care & Treatment/[facility]   Imported By: Sherian Rein 09/22/2009 14:51:17  _____________________________________________________________________  External Attachment:    Type:   Image     Comment:   External Document

## 2010-08-11 NOTE — Progress Notes (Signed)
Summary: pt feels woozy  Phone Note Call from Patient Call back at Home Phone [phone]   Caller: Daughter/Jenni Reason for Call: Talk to Doctor Summary of Call: Want to let md know mom took med that was rx yesterday. After eating breakfast walking back to her room with walker mom just collaspe went to her knees. [name] states she took bp while she was lying down it was 93/54. Mom is now in bed she states she felt whoozy. want to know does md recommend anything Initial call taken by: Orlan Leavens,  January 04, 2010 11:18 AM  Follow-up for Phone Call        spoke with dtr jenni - pt (Julia Black) now better - bp 100s/60 and feels fine - advised to hold lyrica for now - if pain is severe, dtr will call and we will send in lower dose lyrica to use (25mg  two times a day) - also hold coreg today - resume tomorrow AM as scheduled if BP normal or high - call id problems - dtr verbalizes understanding and agrees - Follow-up by: Newt Lukes MD,  January 04, 2010 12:25 PM    New/Updated Medications: LYRICA 50 MG CAPS (PREGABALIN) hold

## 2010-08-11 NOTE — Progress Notes (Signed)
Summary: verbal order complete  Phone Note From Other Clinic   Caller: [facility] Summary of Call: Regan Lemming PT with [facility] called requesting a Skilled Nursing consult for pt. due to recent fall, fractured pelvis, skin tares, hypertension. Order can be faxed to [phone], call back number at [facility].  Initial call taken by: Robin Ewing CMA Duncan Dull),  January 05, 2010 3:10 PM  Follow-up for Phone Call        ok for skilled nursing consult as requested for the reasons listed previously Follow-up by: Newt Lukes MD,  January 05, 2010 3:10 PM  Additional Follow-up for Phone Call Additional follow up Details #1::        called pt left msg to call back Additional Follow-up by: Robin Ewing CMA Duncan Dull),  January 05, 2010 3:18 PM    Additional Follow-up for Phone Call Additional follow up Details #2::    Patient's daughter came in and was informed that a verbal order was done. Follow-up by: Daphane Shepherd,  January 05, 2010 3:36 PM

## 2010-08-11 NOTE — Progress Notes (Signed)
Summary: Call Report  Phone Note Other Incoming   Caller: [facility] Call Report Summary of Call: [facility] Triage Call Report Triage Record Num: [id number] Operator: Valene Bors Patient Name: Julia Black Call Date & Time: 08/28/2009 10:45:28AM Patient Phone: [phone] PCP: Rene Paci Patient Gender: Female PCP Fax : [phone] Patient DOB: [birth year] Practice Name: [facility] Reason for Call: Darl Pikes is calling about Teri being seen in the office for chest congestion with a loose cough. She started on Z pack on 08/25/09. Today 08/28/09 her sx so not seem much better and now she is weak and is having diarrhea. She is afebrile. Home care per Diarrhea on antibiotics and advised to call the office on Mon 08/23/09 if she is not feeling any better. Protocol(s) Used: Diarrhea / Change in Bowel Habits Recommended Outcome per Protocol: See Provider within 24 hours Reason for Outcome: Sudden onset of diarrhea that began 3-5 days after starting an antibiotic Care Advice:  ~ Maintain dietary recommendations for pre-existing conditions.  ~ Continue taking prescribed medications as directed until provider is consulted. Diarrhea Care: - Drink 2-3 quarts (2-3 liters) per day of low sugar content fluids, including over the counter oral hydration solution, unless directed otherwise by provider. - If accompanied by vomiting, take the fluids in frequent small sips or suck on ice chips. - Eat easily digested foods (such as bananas, rice, applesauce, toast, cooked cereals, soup, crackers, baked or boiled potato, or baked chicken or [place] without skin). - Do not eat high fiber, high fat, high sugar content foods, or highly seasoned foods. - Do not drink caffeinated or alcoholic beverages. - Avoid milk and milk products while having symptoms. As symptoms improve, gradually add back to diet. - Application of A&D ointment or witch hazel medicated pads may help anal irritation. -  Antidiarrheal medications are usually unnecessary. If symptoms are severe, consider OTC antidiarrheal and anti-motility drugs as directed by label or a provider.  Initial call taken by: Margaret Pyle, CMA,  August 30, 2009 2:48 PM  Follow-up for Phone Call        noted Follow-up by: Newt Lukes MD,  August 30, 2009 3:00 PM

## 2010-08-11 NOTE — Op Note (Signed)
Summary: [facility]  [facility]   Imported By: Sherian Rein 06/13/2010 07:57:27  _____________________________________________________________________  External Attachment:    Type:   Image     Comment:   External Document

## 2010-08-11 NOTE — Progress Notes (Signed)
Summary: Occupational Therapy  Phone Note From Other Clinic   Summary of Call: Ron Parker from [facility] is wanting to do Occupational Therapy with pt twice a week for three weeks. Rosanne Ashing just needs a verbal ok? Please advise? Ron Parker: [phone] Initial call taken by: Josph Macho RMA,  Nov 26, 2009 4:18 PM  Follow-up for Phone Call        verbal ok from me - thanks Follow-up by: Newt Lukes MD,  Nov 26, 2009 4:46 PM  Additional Follow-up for Phone Call Additional follow up Details #1::        Informed [name]  Additional Follow-up by: Josph Macho RMA,  Nov 26, 2009 4:52 PM

## 2010-08-11 NOTE — Assessment & Plan Note (Signed)
Summary: MOUTH AND EAR PAIN/ [facility]   Vital Signs:  Patient profile:   77 year old female Height:      64 inches (162.56 cm) Weight:      116.0 pounds (52.73 kg) O2 Sat:      94 % on Room air Temp:     97.0 degrees F (36.11 degrees C) oral Pulse rate:   83 / minute BP sitting:   92 / 52  (left arm) Cuff size:   regular  Vitals Entered By: Orlan Leavens (December 30, 2009 10:50 AM)  O2 Flow:  Room air CC: Mouth & ear pain Comments son & daughter-in-law states mom has been very depress. they are very concern about this issue. She has been taking more of the pain pills that she is recommended to take. daughter states last night she took 3. she states keep having mouth pain was taking to the dentist and was evaluated everything was normal. Family is very concern.   Primary Care Provider:  Newt Lukes MD  CC:  Mouth & ear pain.  History of Present Illness: c/o mouth and ear pain - left side describes as sharp and shootingpains - wax/wane involves upper and lower gums on left side no ulceration - denies headache or sinus pain - no eye pain or pressure - no drainage from ear or change in hearing no blisters, rash or redness onset > 1 week ago - had dentist check mouth and teeth x 2 visits - nothing wrong in my mouth taking multiple vicodin w/o relief of pain no prior hx pain like this  recent bronchitis - OV with my partner JJohn reviewed - completing levaquin - 9/10 days abx complete +sputum - thick and deep cough - feels fatigued taking hydromet as rx'd - not better no fever or nasal discharge  depression - pt denies issue but family concerned this is getting worse  Clinical Review Panels:  Immunizations   Last Tetanus Booster:  Historical (07/11/2007)   Last Flu Vaccine:  Historical (04/09/2009)   Last Pneumovax:  Historical (07/11/2007)  CBC   WBC:  9.6 (10/08/2009)   RBC:  2.47 (10/08/2009)   Hgb:  8.2 (10/08/2009)   Hct:  24.3 (10/08/2009)   Platelets:  325  (10/08/2009)   MCV  98.8 (09/27/2009)   MCHC  33.1 (09/27/2009)   RDW  14.3 (09/27/2009)   PMN:  90 (09/27/2009)   Lymphs:  13.0 (09/27/2009)   Monos:  6 (09/27/2009)   Eosinophils:  0 (09/27/2009)   Basophil:  0 (09/27/2009)  Complete Metabolic Panel   Glucose:  102 (10/08/2009)   Sodium:  135 (10/08/2009)   Potassium:  4.0 (10/08/2009)   Chloride:  102 (10/08/2009)   CO2:  28 (10/08/2009)   BUN:  16 (10/08/2009)   Creatinine:  0.56 (10/08/2009)   Albumin:  1.8 (10/08/2009)   Total Protein:  4.6 (10/08/2009)   Calcium:  8.0 (10/08/2009)   Total Bili:  0.6 (10/08/2009)   Alk Phos:  120 (10/08/2009)   SGPT (ALT):  23 (10/08/2009)   SGOT (AST):  20 (10/08/2009)   Current Medications (verified): 1)  Cardizem Cd 180 Mg Xr24h-Cap (Diltiazem Hcl Coated Beads) .... Take 1 Po Qd 2)  Zocor 40 Mg Tabs (Simvastatin) .... Take 1 At Bedtime 3)  Coreg 25 Mg Tabs (Carvedilol) .... Take 1/2 Two Times A Day 4)  Aspirin 81 Mg Tbec (Aspirin) .... Take 1 By Mouth Qd 5)  Prilosec 20 Mg Cpdr (Omeprazole) .... Take 1 Po  Qd 6)  Nitroglycerin 0.6 Mg/hr Pt24 (Nitroglycerin) .... Use Prn 7)  Tramadol Hcl 50 Mg Tabs (Tramadol Hcl) .... Take 1 Three Times A Day As Needed 8)  Fish Oil 1000 Mg Caps (Omega-3 Fatty Acids) .... Take 1 By Mouth Qd 9)  Caltrate 600+d 600-400 Mg-Unit Tabs (Calcium Carbonate-Vitamin D) .... Once Daily 10)  Cvs Spectravite  Tabs (Multiple Vitamins-Minerals) .... Once Daily 11)  Vitamin D 1000 Unit Tabs (Cholecalciferol) .... Once Daily 12)  Vitamin C 500 Mg Tabs (Ascorbic Acid) .... Once Daily 13)  Venlafaxine Hcl 75 Mg Xr24h-Tab (Venlafaxine Hcl) .[name].. 1 By Mouth Once Daily - Start 09/10/09 14)  Align  Caps (Probiotic Product) .[name].. 1 By Mouth Once Daily 15)  Miralax  Powd (Polyethylene Glycol 3350) .[name].. 17g (Scoop) Once Daily With 8oz Water 16)  Ambien 10 Mg Tabs (Zolpidem Tartrate) .[name].. 1 By Mouth At Bedtime Prn 17)  Hydrocodone-Acetaminophen 5-325 Mg Tabs  (Hydrocodone-Acetaminophen) .... 1/2-1 Tab By Mouth Every 4-6 Hours As Needed For Severe Pain 18)  Quinapril Hcl 5 Mg Tabs (Quinapril Hcl) .... Take 1 By Mouth Once Daily 19)  Levaquin 500 Mg Tabs (Levofloxacin) .[name].. 1po Once Daily 20)  Hydrocodone-Homatropine 5-1.5 Mg/22ml Syrp (Hydrocodone-Homatropine) .... 1/2 - 1 Tsp By Mouth Q 6 Hrs As Needed Cough  Allergies (verified): 1)  ! * Spornax  Past History:  Past Medical History: Coronary artery disease Hyperlipidemia Hypertension Allergic rhinitis depression GERD spinal stenosis macular degeneration osteop.[name]. (stopped fosamax indep, await bone density report)   MD roster: cards - [facility] - [facility] - [facility]   optho - [facility] - [facility]  Review of Systems       The patient complains of anorexia.  The patient denies weight loss, vision loss, hoarseness, peripheral edema, hemoptysis, and abdominal pain.    Physical Exam  General:  alert, well-developed, well-nourished, and cooperative to examination.   son and dtr in law barbara at side Eyes:  vision grossly intact; pupils equal, round and reactive to light.  conjunctiva and lids normal.    Ears:  R ear normal and L ear normal., sinus nontender   Mouth:  no gingival abnormalities and pharynx pink and moist.   but  no swelling, mass , no ulcers - dentures fit approp Lungs:  normal respiratory effort, no intercostal retractions or use of accessory muscles; present breath sounds bilaterally - no crackles and no wheezes.   few scattered rhonchi with deep cough effort Heart:  normal rate, regular rhythm, no murmur, and no rub. BLE without edema. Psych:  Oriented X3, memory intact for recent and remote, normally interactive, good eye contact, not anxious appearing, not depressed appearing, and not agitated.      Impression & Recommendations:  Problem # 1:  NEURALGIA, TRIGEMINAL (ICD-350.1)  seems most likely cause of intense pain symptoms as recent  dental eval unremarkable - no evidence for shingles on exam and pain symptoms present > 1 week - as no relief provided with vicodin, start low dose tegretol - stop narcotics due to adv effects (confusion, nausea, fatigue) f/u next week, sooner if problems or cont lack of pain control consider lyrica for possible postherpetic neuralgia though no rash evident -  Orders: Prescription Created Electronically [phone])  Problem # 2:  BRONCHITIS-ACUTE (ICD-466.0) Assessment: Improved  prior cxr last week reviewed - no PNA or abn -  afeb and lungs clear - increase symptoms relief for persisting sputum and complete course of abx as begun - no need for  extension of abx course at this time add tessalon and use otc mucinex The following medications were removed from the medication list:    Hydrocodone-homatropine 5-1.5 Mg/93ml Syrp (Hydrocodone-homatropine) .[name]... 1/2 - 1 tsp by mouth q 6 hrs as needed cough Her updated medication list for this problem includes:    Levaquin 500 Mg Tabs (Levofloxacin) .[name]... 1po once daily    Tessalon 200 Mg Caps (Benzonatate) .[name]... 1 by mouth three times a day for cough    Mucinex Dm Maximum Strength 60-1200 Mg Xr12h-tab (Dextromethorphan-guaifenesin) .[name]... 1 by mouth two times a day  Orders: Prescription Created Electronically [phone])  Problem # 3:  DEPRESSION (ICD-311) prev on lexapro - changes to generic effexor last OV may need inc tx but will address further after stablization of acute pain issues and recent resp illness Her updated medication list for this problem includes:    Venlafaxine Hcl 75 Mg Xr24h-tab (Venlafaxine hcl) .[name]... 1 by mouth once daily - start 09/10/09  Complete Medication List: 1)  Cardizem Cd 180 Mg Xr24h-cap (Diltiazem hcl coated beads) .... Take 1 po qd 2)  Zocor 40 Mg Tabs (Simvastatin) .... Take 1 at bedtime 3)  Coreg 25 Mg Tabs (Carvedilol) .... Take 1/2 two times a day 4)  Aspirin 81 Mg Tbec (Aspirin) .... Take 1 by mouth qd 5)   Prilosec 20 Mg Cpdr (Omeprazole) .... Take 1 po qd 6)  Nitroglycerin 0.6 Mg/hr Pt24 (Nitroglycerin) .... Use prn 7)  Tramadol Hcl 50 Mg Tabs (Tramadol hcl) .... Take 1 three times a day as needed 8)  Fish Oil 1000 Mg Caps (Omega-3 fatty acids) .... Take 1 by mouth qd 9)  Caltrate 600+d 600-400 Mg-unit Tabs (Calcium carbonate-vitamin d) .... Once daily 10)  Cvs Spectravite Tabs (Multiple vitamins-minerals) .... Once daily 11)  Vitamin D 1000 Unit Tabs (Cholecalciferol) .... Once daily 12)  Vitamin C 500 Mg Tabs (Ascorbic acid) .... Once daily 13)  Venlafaxine Hcl 75 Mg Xr24h-tab (Venlafaxine hcl) .[name].. 1 by mouth once daily - start 09/10/09 14)  Align Caps (Probiotic product) .[name].. 1 by mouth once daily 15)  Miralax Powd (Polyethylene glycol 3350) .[name].. 17g (scoop) once daily with 8oz water 16)  Ambien 10 Mg Tabs (Zolpidem tartrate) .[name].. 1 by mouth at bedtime prn 17)  Quinapril Hcl 5 Mg Tabs (Quinapril hcl) .... Take 1 by mouth once daily 18)  Levaquin 500 Mg Tabs (Levofloxacin) .[name].. 1po once daily 19)  Carbamazepine 100 Mg Chew (Carbamazepine) .[name].. 1 by mouth two times a day 20)  Tessalon 200 Mg Caps (Benzonatate) .[name].. 1 by mouth three times a day for cough 21)  Mucinex Dm Maximum Strength 60-1200 Mg Xr12h-tab (Dextromethorphan-guaifenesin) .[name].. 1 by mouth two times a day  Patient Instructions: 1)  it was good to see you today. 2)  finish course of antibiotics as previously prescribed - do not start amoxicillin at this time 3)  stop pain pills and cough syrup and start generic tegretol for trigeminal neuralgia pain  4)  use tessalon three times a day for cough 5)  your prescriptions have been electronically submitted to your pharmacy. Please take as directed. Contact our office if you believe you're having problems with the medication(s).  6)  change mucinex to mucinex DM - take two times a day until i see you again 7)  if your symptoms worsen (pain, fever, etc), or if you are unable take  anything by mouth (pills, fluids, etc), you should call here or go to the emergency  room for further evaluation and treatment.  8)  Please schedule a follow-up appointment next week to re-evaluate symptoms, sooner if problems.  Prescriptions: TESSALON 200 MG CAPS (BENZONATATE) 1 by mouth three times a day for cough  #40 x 1   Entered by:   Orlan Leavens   Authorized by:   Newt Lukes MD   Signed by:   Orlan Leavens on 12/30/2009   Method used:   Electronically to        [facility] (retail)       [address]       [place], [place]  [id number]       Ph: [phone] or [phone]       Fax: [phone]   RxID:   [id number] CARBAMAZEPINE 100 MG CHEW (CARBAMAZEPINE) 1 by mouth two times a day  #60 x 0   Entered by:   Orlan Leavens   Authorized by:   Newt Lukes MD   Signed by:   Orlan Leavens on 12/30/2009   Method used:   Electronically to        [facility] (retail)       [address]       [place], [place]  [id number]       Ph: [phone] or [phone]       Fax: [phone]   RxID:   [id number] TESSALON 200 MG CAPS (BENZONATATE) 1 by mouth three times a day for cough  #40 x 1   Entered and Authorized by:   Newt Lukes MD   Signed by:   Newt Lukes MD on 12/30/2009   Method used:   Electronically to        [phone]* (retail)       [address]       [place], [place]  [address]       Ph: [phone] or [phone]       Fax: [phone]   RxID:   [id number] CARBAMAZEPINE 100 MG CHEW (CARBAMAZEPINE) 1 by mouth two times a day  #60 x 0   Entered and Authorized by:   Newt Lukes MD   Signed by:   Newt Lukes MD on 12/30/2009   Method used:   Electronically to        [facility] [phone]* (retail)       [address]       [place], [place]  [address]       Ph: [phone] or [phone]       Fax: [phone]   RxID:    [id number]

## 2010-08-11 NOTE — Miscellaneous (Signed)
Summary: Missed Visit/[facility]  Missed Visit/[facility]   Imported By: Sherian Rein 01/05/2010 14:41:57  _____________________________________________________________________  External Attachment:    Type:   Image     Comment:   External Document

## 2010-08-11 NOTE — Miscellaneous (Signed)
Summary: Optometrist Release   Imported By: Lester Bartlett 07/16/2009 10:50:40  _____________________________________________________________________  External Attachment:    Type:   Image     Comment:   External Document

## 2010-08-11 NOTE — Progress Notes (Signed)
Summary: FYI _ [facility]   Phone Note From Other Clinic   Caller: [facility] RN CALLED - Archie Patten [phone] Summary of Call: RN d/c'd patient and then recieved wound care orders. She has already addressed this and patient also aware of clinic at assisted living facility. FYI  Initial call taken by: Lamar Sprinkles, CMA,  December 17, 2009 11:20 AM  Follow-up for Phone Call        ok - thanks Follow-up by: Newt Lukes MD,  December 20, 2009 6:46 AM

## 2010-08-11 NOTE — Progress Notes (Signed)
Summary: lab results  Phone Note Call from Patient   Caller: Leonard Downing [phone] Summary of Call: pt's son called to get lab results. I advised that results were all negative. Pt's son states that pt is feeling much better, no N&V, diarrhea but slight abd cramp. Initial call taken by: Margaret Pyle, CMA,  September 08, 2009 4:10 PM  Follow-up for Phone Call        noted Follow-up by: Newt Lukes MD,  September 08, 2009 4:17 PM

## 2010-08-11 NOTE — Letter (Signed)
Summary: Bilateral knee pain/[facility]  Bilateral [facility] pain/[facility]   Imported By: Sherian Rein 07/28/2009 15:07:04  _____________________________________________________________________  External Attachment:    Type:   Image     Comment:   External Document

## 2010-08-11 NOTE — Progress Notes (Signed)
Summary: Remus Loffler  Phone Note Refill Request Message from:  Fax from Pharmacy on March 29, 2010 10:13 AM  Refills Requested: Medication #1:  AMBIEN 10 MG TABS 1 by mouth at bedtime prn # 30   Last Refilled: 03/01/2010 [facility]  Last ov 03/18/10 Is this ok to refill?  Next Appointment Scheduled: 06/24/10 Initial call taken by: Orlan Leavens RMA,  March 29, 2010 10:14 AM  Follow-up for Phone Call        ok to fill as prev rx'd Follow-up by: Newt Lukes MD,  March 29, 2010 10:50 AM  Additional Follow-up for Phone Call Additional follow up Details #1::        Faxed paper request back to [facility]. Updated EMR Additional Follow-up by: Orlan Leavens RMA,  March 29, 2010 10:55 AM    Prescriptions: AMBIEN 10 MG TABS (ZOLPIDEM TARTRATE) 1 by mouth at bedtime prn  #30 x 1   Entered by:   Orlan Leavens RMA   Authorized by:   Newt Lukes MD   Signed by:   Orlan Leavens RMA on 03/29/2010   Method used:   Historical   RxID:   [id number]

## 2010-08-11 NOTE — Miscellaneous (Signed)
Summary: Medication Issue/[facility]  Medication Issue/[facility]   Imported By: Sherian Rein 01/06/2010 13:59:50  _____________________________________________________________________  External Attachment:    Type:   Image     Comment:   External Document

## 2010-08-11 NOTE — Progress Notes (Signed)
Summary: sinus infection  Phone Note Call from Patient   Caller: Patient Call For: Newt Lukes MD Reason for Call: Acute Illness Summary of Call: Pt left msg on my vm requesting md to send some thing for head cold. congestion has settle in her sinus/head. Called pt back since i was out of office on Friday to see if she recieved call back from out office. No ansew LMOM RTC Initial call taken by: Orlan Leavens RMA,  July 12, 2010 10:14 AM  Follow-up for Phone Call        Called pt again still no ansew LMOM RTC Follow-up by: Orlan Leavens RMA,  July 12, 2010 1:50 PM  Additional Follow-up for Phone Call Additional follow up Details #1::        Pt return call back she stated she came in on friday afterall had sinus infection. Larey Seat alot better Additional Follow-up by: Orlan Leavens RMA,  July 12, 2010 2:26 PM

## 2010-08-11 NOTE — Progress Notes (Signed)
Summary: furosemide  Phone Note Call from Patient   Caller: Daughter/ Darl Pikes [phone] Summary of Call: Daughter call and wanted to let md know mom started on the furosemide that was rx yesterday but she is not urinating like md stated at visit. They are ? if she need o continue taking for the next 7 days or should she just stop due to her being dehydrated last week. pls advise Initial call taken by: Orlan Leavens,  January 18, 2010 1:20 PM  Follow-up for Phone Call        change furosemide to every other day only as needed for swelling - no need to take once daily for 7 days - thanks Follow-up by: Newt Lukes MD,  January 18, 2010 1:33 PM  Additional Follow-up for Phone Call Additional follow up Details #1::        Notified daughter with md recommendations Additional Follow-up by: Orlan Leavens,  January 18, 2010 2:13 PM

## 2010-08-11 NOTE — Assessment & Plan Note (Signed)
Summary: severe stomach pains over the weekend per daughter-lb   Vital Signs:  Patient profile:   77 year old female Height:      65 inches (165.10 cm) Weight:      122.12 pounds (55.51 kg) O2 Sat:      95 % on Room air Temp:     97.9 degrees F (36.61 degrees C) oral Pulse rate:   73 / minute BP sitting:   146 / 74  (left arm) Cuff size:   regular  Vitals Entered By: Orlan Leavens (September 27, 2009 11:48 AM)  O2 Flow:  Room air CC: stomach cramps, nausea this am, Abdominal pain Is Patient Diabetic? No Pain Assessment Patient in pain? yes     Location: stomach Type: cramps   Primary Care Provider:  Newt Lukes MD  CC:  stomach cramps, nausea this am, and Abdominal pain.  History of Present Illness:  Abdominal Pain      This is an 77 year old woman who presents with Abdominal pain.  The symptoms began >6 weeks ago.  On a scale of 1 to 10, the intensity is described as a 9.  pain located anywhere" across belly or abd - high or low, left or right; pain comes and goes; can be "nothing" -0- and then "very severe" -9- spasms - pain spell lasts few minutes to 1 hour each spell, then relief can be short duration or up to 2 days.  The patient reports nausea and diarrhea, but denies vomiting, constipation, melena, hematochezia, anorexia, and hematemesis.  The location of the pain is diffuse.  The pain is described as intermittent, sharp, and cramping in quality.  The patient denies the following symptoms: fever, weight loss, dysuria, and chest pain.  The pain is better with defecation and sleep.    seen almost 1 mo ago by my collegue TJ for same - stool studies neg for infx (labs reviewed) - still taking Align adn pnly using Miralax as needed - has never had colo - pt ? if could be GB stones as a friend had GB probs with similar symptoms   Clinical Review Panels:  Immunizations   Last Tetanus Booster:  Historical (07/11/2007)   Last Flu Vaccine:  Historical (04/09/2009)   Last  Pneumovax:  Historical (07/11/2007)  Diabetes Management   Creatinine:  0.62 (03/30/2008)   Last Flu Vaccine:  Historical (04/09/2009)   Last Pneumovax:  Historical (07/11/2007)  CBC   WBC:  6.5 (03/30/2008)   Hct:  41.4 (03/30/2008)   Platelets:  224 (03/30/2008)   MCV  100.3 (03/30/2008)  Complete Metabolic Panel   Glucose:  117 (03/30/2008)   Sodium:  134 (03/30/2008)   Potassium:  3.7 (03/30/2008)   Chloride:  102 (03/30/2008)   CO2:  23 (03/30/2008)   BUN:  14 (03/30/2008)   Creatinine:  0.62 (03/30/2008)   Albumin:  3.3 (03/30/2008)   Calcium:  8.6 (03/30/2008)   Total Bili:  0.9 (03/30/2008)   Alk Phos:  47 (03/30/2008)   SGPT (ALT):  20 (03/30/2008)   SGOT (AST):  23 (03/30/2008)   Current Medications (verified): 1)  Cardizem Cd 180 Mg Xr24h-Cap (Diltiazem Hcl Coated Beads) .... Take 1 Po Qd 2)  Quinapril Hcl 20 Mg Tabs (Quinapril Hcl) .... Take 1 By Mouth Qd 3)  Tekturna 150 Mg Tabs (Aliskiren Fumarate) .... Take 1 Po Qd 4)  Zocor 40 Mg Tabs (Simvastatin) .... Take 1 At Bedtime 5)  Coreg 25 Mg Tabs (Carvedilol) .... Take  1/2 Two Times A Day 6)  Aspirin 81 Mg Tbec (Aspirin) .... Take 1 By Mouth Qd 7)  Prilosec 20 Mg Cpdr (Omeprazole) .... Take 1 Po Qd 8)  Nitroglycerin 0.6 Mg/hr Pt24 (Nitroglycerin) .... Use Prn 9)  Tramadol Hcl 50 Mg Tabs (Tramadol Hcl) .... Take 1 Three Times A Day As Needed 10)  Ra Arthritis Pain Relief 650 Mg Cr-Tabs (Acetaminophen) .... Take 6-8 By Mouth Once Daily 11)  Fish Oil 1000 Mg Caps (Omega-3 Fatty Acids) .... Take 1 By Mouth Qd 12)  Caltrate 600+d 600-400 Mg-Unit Tabs (Calcium Carbonate-Vitamin D) .... Once Daily 13)  Cvs Spectravite  Tabs (Multiple Vitamins-Minerals) .... Once Daily 14)  Vitamin D 1000 Unit Tabs (Cholecalciferol) .... Once Daily 15)  Vitamin C 500 Mg Tabs (Ascorbic Acid) .... Once Daily 16)  Venlafaxine Hcl 37.5 Mg Xr24h-Tab (Venlafaxine Hcl) .[name].. 1 By Mouth Once Daily X 2 Weeks, Then 2 By Mouth Once Daily 17)   Venlafaxine Hcl 75 Mg Xr24h-Tab (Venlafaxine Hcl) .[name].. 1 By Mouth Once Daily - Start 09/10/09 18)  Align  Caps (Probiotic Product) .[name].. 1 By Mouth Once Daily 19)  Miralax  Powd (Polyethylene Glycol 3350) .[name].. 17g (Scoop) Once Daily With [employer] 20)  Magic Mouthwash .... 5 Cc By Mouth Three Times A Day (Swish and Swallow) X 5 Days - Then As Needed 21)  Ambien 10 Mg Tabs (Zolpidem Tartrate) .[name].. 1 By Mouth At Bedtime Prn  Allergies (verified): 1)  ! * Spornax  Past History:  Past Medical History: Coronary artery disease Hyperlipidemia Hypertension Allergic rhinitis depression GERD spinal stenosis macular degeneration osteop.[name]. (stopped fosamax indep, await bone density report)  MD rooster: cards - [facility] - [facility] neurosurg - [facility]   optho - [facility] ([facility]) ortho - [facility]  Review of Systems  The patient denies weight loss, syncope, dyspnea on exertion, peripheral edema, prolonged cough, and headaches.    Physical Exam  General:  alert, well-developed, well-nourished, and cooperative to examination.   dtr-in-law and son at side Eyes:  vision grossly intact; pupils equal, round and reactive to light.  conjunctiva and lids normal.    Lungs:  normal respiratory effort, no intercostal retractions or use of accessory muscles; normal breath sounds bilaterally - no crackles and no wheezes.    Heart:  normal rate, regular rhythm, no murmur, and no rub. BLE without edema. normal DP pulses and normal cap refill in all 4 extremities    Abdomen:  soft, non-tender, normal bowel sounds, no distention; no masses and no appreciable hepatomegaly or splenomegaly.     Impression & Recommendations:  Problem # 1:  ABDOMINAL PAIN, GENERALIZED (ICD-789.07) ?ischemic as pain "out of proportion to exam" - doubt infx but certainly in Ddx check labs now -  also CT to look for coloitis changes - if isch distribution, pursue MRA vs. angio - ?surg or GI eval depending on findings and symptoms -  explained same to pt and family (Time spent with patient 30 minutes, more than 50% of this time was spent counseling patient on possible causes of abd pain symptoms and plans for eval and tx) cont Align, as needed tylox if severe  Orders: TLB-CBC Platelet - w/Differential (85025-CBCD) TLB-BMP (Basic Metabolic Panel-BMET) (80048-METABOL) TLB-Hepatic/Liver Function Pnl (80076-HEPATIC) TLB-Udip w/ Micro (81001-URINE) TLB-Lipase (83690-LIPASE) Misc. Referral (Misc. Ref)  Complete Medication List: 1)  Cardizem Cd 180 Mg Xr24h-cap (Diltiazem hcl coated beads) .... Take 1 po qd 2)  Quinapril Hcl 20 Mg Tabs (Quinapril hcl) .... Take 1  by mouth qd 3)  Tekturna 150 Mg Tabs (Aliskiren fumarate) .... Take 1 po qd 4)  Zocor 40 Mg Tabs (Simvastatin) .... Take 1 at bedtime 5)  Coreg 25 Mg Tabs (Carvedilol) .... Take 1/2 two times a day 6)  Aspirin 81 Mg Tbec (Aspirin) .... Take 1 by mouth qd 7)  Prilosec 20 Mg Cpdr (Omeprazole) .... Take 1 po qd 8)  Nitroglycerin 0.6 Mg/hr Pt24 (Nitroglycerin) .... Use prn 9)  Tramadol Hcl 50 Mg Tabs (Tramadol hcl) .... Take 1 three times a day as needed 10)  Ra Arthritis Pain Relief 650 Mg Cr-tabs (Acetaminophen) .... Take 6-8 by mouth once daily 11)  Fish Oil 1000 Mg Caps (Omega-3 fatty acids) .... Take 1 by mouth qd 12)  Caltrate 600+d 600-400 Mg-unit Tabs (Calcium carbonate-vitamin d) .... Once daily 13)  Cvs Spectravite Tabs (Multiple vitamins-minerals) .... Once daily 14)  Vitamin D 1000 Unit Tabs (Cholecalciferol) .... Once daily 15)  Vitamin C 500 Mg Tabs (Ascorbic acid) .... Once daily 16)  Venlafaxine Hcl 75 Mg Xr24h-tab (Venlafaxine hcl) .[name].. 1 by mouth once daily - start 09/10/09 17)  Align Caps (Probiotic product) .[name].. 1 by mouth once daily 18)  Miralax Powd (Polyethylene glycol 3350) .[name].. 17g (scoop) once daily with 8oz water 19)  Magic Mouthwash  .... 5 cc by mouth three times a day (swish and swallow) x 5 days - then as needed 20)  Ambien 10 Mg Tabs  (Zolpidem tartrate) .[name].. 1 by mouth at bedtime prn 21)  Hydrocodone-acetaminophen 5-325 Mg Tabs (Hydrocodone-acetaminophen) .... 1/2-1 tab by mouth every 4-6 hours as needed for severe pain  Patient Instructions: 1)  it was good to see you today. 2)  test(s) ordered today - your results will be posted on the phone tree for review in 48-72 hours from the time of test completion; call [phone] and enter your 9 digit MRN (listed above on this page, just below your name); if any changes need to be made or there are abnormal results, you will be contacted directly.  3)  we'll make referral for CT scan of abdomen and pelvis to look for causes of pain. Our office will contact you regarding this appointment once made.  4)  Further testing or treatment will depend on these results and your symptoms .... 5)  vicodin to use as directed for severe pain spells- continue Align and other medications as needed Prescriptions: HYDROCODONE-ACETAMINOPHEN 5-325 MG TABS (HYDROCODONE-ACETAMINOPHEN) 1/2-1 tab by mouth every 4-6 hours as needed for severe pain  #20 x 0   Entered and Authorized by:   Newt Lukes MD   Signed by:   Newt Lukes MD on 09/27/2009   Method used:   Print then Give to Patient   RxID:   [id number]

## 2010-08-11 NOTE — Assessment & Plan Note (Signed)
Summary: NEW/ MEDICARE / NWS   Vital Signs:  Patient profile:   77 year old female Height:      65 inches (165.10 cm) Weight:      131.8 pounds (59.91 kg) O2 Sat:      90 % on Room air Temp:     97.6 degrees F (36.44 degrees C) oral Pulse rate:   54 / minute BP sitting:   128 / 70  (left arm) Cuff size:   regular  Vitals Entered By: Orlan Leavens (July 15, 2009 9:44 AM)  O2 Flow:  Room air CC: New patient/ Need rx for tekturna & lexapro Is Patient Diabetic? No Pain Assessment Patient in pain? no        Primary Care Provider:  Newt Lukes MD  CC:  New patient/ Need rx for tekturna & lexapro.  History of Present Illness: new pt to me and practice - here to est care prev care with dr. Cindee Lame  1) HTN - reports compliance with ongoing medical treatment and no changes in medication dose or frequency. denies adverse side effects related to current therapy.   2)OA - namely spinal stenosis injection by Nsurg q 3mos in back to help with pain takes tylenol and ultram to help control but never relieves pain symptoms -  intol of narcotics - asks about "pain patch" - explained this is also narc med  3)CAD s/p CABG 2004 - no CP or anginal symptoms  - reports compliance with ongoing medical treatment and no changes in medication dose or frequency. denies adverse side effects related to current therapy.  follows with dr. Nadara Eaton, now [facility] at [facility]  Preventive Screening-Counseling & Management  Alcohol-Tobacco     Alcohol drinks/day: <1     Alcohol Counseling: not indicated; use of alcohol is not excessive or problematic     Smoking Status: quit     Year Quit: 1980s  Caffeine-Diet-Exercise     Caffeine use/day: 1-2     Caffeine Counseling: not indicated; caffeine use is not excessive or problematic     Does Patient Exercise: yes     Type of exercise: pilates     Exercise Counseling: not indicated; exercise is adequate     Depression Counseling: not indicated;  screening negative for depression  Safety-Violence-Falls     Smoke Detector Counseling: n/a     Fall Risk Counseling: not indicated; no significant falls noted  Clinical Review Panels:  Immunizations   Last Tetanus Booster:  Historical (07/11/2007)   Last Flu Vaccine:  Historical (04/09/2009)   Last Pneumovax:  Historical (07/11/2007)   Current Medications (verified): 1)  Cardizem Cd 180 Mg Xr24h-Cap (Diltiazem Hcl Coated Beads) .... Take 1 Po Qd 2)  Quinapril Hcl 20 Mg Tabs (Quinapril Hcl) .... Take 1 By Mouth Qd 3)  Tekturna 150 Mg Tabs (Aliskiren Fumarate) .... Take 1 Po Qd 4)  Zocor 40 Mg Tabs (Simvastatin) .... Take 1 At Bedtime 5)  Coreg 25 Mg Tabs (Carvedilol) .... Take 1/2 Two Times A Day 6)  Lexapro 10 Mg Tabs (Escitalopram Oxalate) .... Take 1 By Mouth Qd 7)  Aspirin 81 Mg Tbec (Aspirin) .... Take 1 By Mouth Qd 8)  Prilosec 20 Mg Cpdr (Omeprazole) .... Take 1 Po Qd 9)  Nitroglycerin 0.6 Mg/hr Pt24 (Nitroglycerin) .... Use Prn 10)  Tramadol Hcl 50 Mg Tabs (Tramadol Hcl) .... Take 1 Three Times A Day As Needed 11)  Ra Arthritis Pain Relief 650 Mg Cr-Tabs (Acetaminophen) .... Take 6-8  By Mouth Once Daily 12)  Fish Oil 1000 Mg Caps (Omega-3 Fatty Acids) .... Take 1 By Mouth Qd 13)  Caltrate 600+d 600-400 Mg-Unit Tabs (Calcium Carbonate-Vitamin D) .... Once Daily 14)  Cvs Spectravite  Tabs (Multiple Vitamins-Minerals) .... Once Daily 15)  Vitamin D 1000 Unit Tabs (Cholecalciferol) .... Once Daily 16)  Vitamin C 500 Mg Tabs (Ascorbic Acid) .... Once Daily  Allergies (verified): 1)  ! * Spornax  Past History:  Past Medical History: Coronary artery disease Hyperlipidemia Hypertension Allergic rhinitis depression GERD spinal stenosis macular degeneration osteop.[name]. 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 I have reviewed all other systems and they were negative.   Physical Exam  General:  alert, well-developed, well-nourished, and cooperative to examination.    Eyes:  mild hazy change to left eye Ears:  hearing grossly normal, no external deformities Mouth:  teeth and gums in good repair; mucous membranes moist, without lesions or ulcers. oropharynx clear without exudate, erythema.  Lungs:  normal respiratory effort, no intercostal retractions or use of accessory muscles; normal breath sounds bilaterally - no crackles and no wheezes.    Heart:  normal rate, regular rhythm, no murmur, and no rub. BLE without edema. normal DP pulses and normal cap refill in all 4 extremities    Abdomen:  soft, non-tender, normal bowel sounds, no distention; no masses and no appreciable hepatomegaly or splenomegaly.   Msk:  no gross deformities Neurologic:  alert & oriented X3 and cranial nerves II-XII symetrically intact.  strength normal in all extremities, sensation intact to light touch, and gait normal. speech fluent without dysarthria or aphasia; follows commands with good comprehension.  Psych:  Oriented X3, memory intact for recent and remote, normally interactive, good eye contact, not anxious appearing, not depressed appearing, and not agitated.      Impression & Recommendations:  Problem # 1:  SPINAL STENOSIS (ICD-724.00) advised against pain patch due to intol of narcs also advised against chiropractic manipualation unless  approved by her neurosurg as could become worse cont tylenol and tramadol as needed  cont steroid shots and mgmt as per [facility]  Problem # 2:  HYPERTENSION (ICD-401.9)  Her updated medication list for this problem includes:    Cardizem Cd 180 Mg Xr24h-cap (Diltiazem hcl coated beads) .[name]... Take 1 po qd    Quinapril Hcl 20 Mg Tabs (Quinapril hcl) .[name]... Take 1 by mouth qd    Tekturna 150 Mg Tabs (Aliskiren fumarate) .[name]... Take 1 po qd    Coreg 25 Mg Tabs (Carvedilol) .[name]... Take 1/2 two times a day  BP today: 128/70  Labs Reviewed: K+: 3.7 (03/30/2008) Creat: : 0.62 (03/30/2008)     Problem # 3:  CORONARY ARTERY DISEASE (ICD-414.00) s/pCABG 2004- follows w/ [facility] cards wiill send for and review records. cath from 2004 (leading to CABG) reviewed in echart hosp emr today Her updated medication list for this problem includes:  Cardizem Cd 180 Mg Xr24h-cap (Diltiazem hcl coated beads) .[name]... Take 1 po qd    Quinapril Hcl 20 Mg Tabs (Quinapril hcl) .[name]... Take 1 by mouth qd    Tekturna 150 Mg Tabs (Aliskiren fumarate) .[name]... Take 1 po qd    Coreg 25 Mg Tabs (Carvedilol) .[name]... Take 1/2 two times a day    Aspirin 81 Mg Tbec (Aspirin) .[name]... Take 1 by mouth qd    Nitroglycerin 0.6 Mg/hr Pt24 (Nitroglycerin) ..... Use prn  Problem # 4:  HYPERLIPIDEMIA (ICD-272.4) reports labs done 1 mo ago by prior PCP  - will send for records Her updated medication list for this problem includes:    Zocor 40 Mg Tabs (Simvastatin) .[name]... Take 1 at bedtime  Labs Reviewed: SGOT: 23 (03/30/2008)   SGPT: 20 (03/30/2008)  Problem # 5:  GERD (ICD-530.81)  Her updated medication list for this problem includes:    Prilosec 20 Mg Cpdr (Omeprazole) .[name]... Take 1 po qd  Labs Reviewed: Hct: 41.4 (03/30/2008)  Problem # 6:  DEPRESSION (ICD-311) well co trolled - cont same med rx for same ( prev on samples only) Her updated medication list for this problem includes:    Lexapro 10 Mg Tabs (Escitalopram oxalate)  .[name]... Take 1 by mouth qd Time spent with patient 45 minutes, more than 50% of this time was spent counseling pt on managment of chronic pain and caution with manipulative therapies/narcs unless cleared with neurosurg; also on review of past med issues, labs and curent medications  Complete Medication List: 1)  Cardizem Cd 180 Mg Xr24h-cap (Diltiazem hcl coated beads) .... Take 1 po qd 2)  Quinapril Hcl 20 Mg Tabs (Quinapril hcl) .... Take 1 by mouth qd 3)  Tekturna 150 Mg Tabs (Aliskiren fumarate) .... Take 1 po qd 4)  Zocor 40 Mg Tabs (Simvastatin) .... Take 1 at bedtime 5)  Coreg 25 Mg Tabs (Carvedilol) .... Take 1/2 two times a day 6)  Lexapro 10 Mg Tabs (Escitalopram oxalate) .... Take 1 by mouth qd 7)  Aspirin 81 Mg Tbec (Aspirin) .... Take 1 by mouth qd 8)  Prilosec 20 Mg Cpdr (Omeprazole) .... Take 1 po qd 9)  Nitroglycerin 0.6 Mg/hr Pt24 (Nitroglycerin) .... Use prn 10)  Tramadol Hcl 50 Mg Tabs (Tramadol hcl) .... Take 1 three times a day as needed 11)  Ra Arthritis Pain Relief 650 Mg Cr-tabs (Acetaminophen) .... Take 6-8 by mouth once daily 12)  Fish Oil 1000 Mg Caps (Omega-3 fatty acids) .... Take 1 by mouth qd 13)  Caltrate 600+d 600-400 Mg-unit Tabs (Calcium carbonate-vitamin d) .... Once daily 14)  Cvs Spectravite Tabs (Multiple vitamins-minerals) .... Once daily 15)  Vitamin D 1000 Unit Tabs (Cholecalciferol) .... Once daily 16)  Vitamin C 500 Mg Tabs (Ascorbic acid) .... Once daily  Patient Instructions: 1)  it was good to see you today.  2)  no medication changes recommended today - keep doing what you are doing - 3)  i will check with dr. Danielle Dess re: saftey of massage or other therpay to help with your pain symptoms but would avoid chiropractor treatments at this time 4)  will send for records from dr. Ludwig Clarks to incorporate into our files here - 5)  Please schedule a follow-up appointment in 3 months, sooner if problems.  Prescriptions: LEXAPRO 10 MG TABS (ESCITALOPRAM  OXALATE) take 1 by mouth qd  #30 x 5   Entered by:   Orlan Leavens   Authorized by:   Raenette Rover  Felicity Coyer MD   Signed by:   Orlan Leavens on 07/15/2009   Method used:   Electronically to        [facility] [address] [phone]* (retail)       [address]       [place], [place]  [address]       Ph: [phone] or [phone]       Fax: [phone]   RxID:   [id number] TEKTURNA 150 MG TABS (ALISKIREN FUMARATE) take 1 po qd  #30 x 5   Entered by:   Orlan Leavens   Authorized by:   Newt Lukes MD   Signed by:   Orlan Leavens on 07/15/2009   Method used:   [facility]        [facility] [address] [phone]* (retail)       [address]       [place], [place]  [address]       Ph: [phone] or [phone]       Fax: [phone]   RxID:   [id number]      Immunization History:  Tetanus/Td Immunization History:    Tetanus/Td:  historical (07/11/2007)  Influenza Immunization History:    Influenza:  historical (04/09/2009)  Pneumovax Immunization History:    Pneumovax:  historical (07/11/2007)

## 2010-08-11 NOTE — Progress Notes (Signed)
Summary: HHRN  Phone Note Other Incoming   Caller: Nefertiti at [employer] [phone] Summary of Call: HHRN called to inform MD that pt systolic BP is usually 90s at around [facility] and in the 120s later in the day (3p). RN wanted MD to know in case medication adjustment was needed. Initial call taken by: Margaret Pyle, CMA,  January 27, 2010 10:59 AM  Follow-up for Phone Call        if pt having dizziness or other symptoms, can consider changes -- but if no symptoms, no changes needed (SBP 90s ok) - cont same for now - thanks Follow-up by: Newt Lukes MD,  January 27, 2010 12:32 PM  Additional Follow-up for Phone Call Additional follow up Details #1::        Per [facility] pt is asymptomatic. RN advised no changes Additional Follow-up by: Margaret Pyle, CMA,  January 27, 2010 1:49 PM

## 2010-08-11 NOTE — Miscellaneous (Signed)
Summary: Plan of Care & Treatment/[facility]  Plan of Care & Treatment/[facility]   Imported By: Sherian Rein 12/08/2009 12:26:08  _____________________________________________________________________  External Attachment:    Type:   Image     Comment:   External Document

## 2010-08-11 NOTE — Progress Notes (Signed)
Summary: BP/ constipation  Phone Note Outgoing Call   Call placed by: Orlan Leavens,  January 11, 2010 9:28 AM Call placed to: Patient Summary of Call: Recieved vm from pt daughter [name]) on fri. (we was out of office) leaving moms BP reading (01/05/10) @ 9:45 152/84, 10:15 179/91, 10:40 152/88, 11:00 153/79. On 01/06/10@ 7:30am 150/91, 9:00 145/82, 12:45 127/69, 3:00 117/60, 7:45pm 159/78, 9:00pm 140/86. [facility] called 01/07/10 left msg with triage was told to add back Qunapril along with 1/2 of coreg.  Follow-up for Phone Call        Called pt back this am to check on BP. Pt states nurse took bp this Orlena Sheldon it was 121/64. Still taking whole Qunapril and 1/2 coreg. She also wanted to let md know she have'nt had bowel movement in 4 days. Want to know what she is recommending ? Follow-up by: Orlan Leavens,  January 11, 2010 10:05 AM  Additional Follow-up for Phone Call Additional follow up Details #1::        senokot-s (otc) 1-2 daily as needed - or miralax (otc) daily as needed (per label directions) - cont meds for bp as ongoing - thanks Additional Follow-up by: Newt Lukes MD,  January 11, 2010 10:43 AM    Additional Follow-up for Phone Call Additional follow up Details #2::    Notified pt with md recommendations Follow-up by: Orlan Leavens,  January 11, 2010 11:13 AM

## 2010-08-11 NOTE — Progress Notes (Signed)
Summary: GI Referral  Phone Note Outgoing Call   Call placed by: Dagoberto Reef,  October 07, 2009 2:31 PM Summary of Call: Dr Felicity Coyer, Pt no showed her appt with Lucre[facility] for 10/05/09 @ 1030am.  Please advise.   Thanks Initial call taken by: Dagoberto Reef,  October 07, 2009 2:33 PM  Follow-up for Phone Call        pt was in the hosp when she "no showed" - she was seen by GI in the hosp so does not need to resched - thanks Follow-up by: Newt Lukes MD,  October 08, 2009 8:23 AM

## 2010-08-11 NOTE — Progress Notes (Signed)
Summary: bp  Phone Note Call from Patient   Caller: Daughter [phone] Summary of Call: Patient daughter called stating that the patient BP has been really high, 172+ and the patient re-started BP meds without consulting an MD. Is it ok that she has done this, please advise. Initial call taken by: Lucious Groves,  January 07, 2010 4:19 PM  Follow-up for Phone Call        ok to re-start the quinapril 5 mg, but only HALF of the coreg  please check BP twice daily over the weekend and call tues july 5 with results Follow-up by: Corwin Levins MD,  January 07, 2010 4:39 PM  Additional Follow-up for Phone Call Additional follow up Details #1::        Pt's daughter Britta Mccreedy informed and will call Tuesday with pt's BP readings Additional Follow-up by: Margaret Pyle, CMA,  January 07, 2010 4:49 PM

## 2010-08-11 NOTE — Letter (Signed)
Summary: [facility] wheelchair/[facility]  [facility]   Imported By: Sherian Rein 04/12/2010 07:39:39  _____________________________________________________________________  External Attachment:    Type:   Image     Comment:   External Document

## 2010-08-11 NOTE — Progress Notes (Signed)
Summary: Call Report  Phone Note Other Incoming   Caller: [facility] Call Report Summary of Call: [facility] Triage Call Report Triage Record Num: [id number] Operator: Revonda Humphrey Patient Name: Julia Black Call Date & Time: 08/28/2009 4:54:40PM Patient Phone: [phone] PCP: Rene Paci Patient Gender: Female PCP Fax : [phone] Patient DOB: 1918/08/09 Practice Name: [facility] Reason for Call: Daughter-in-law, Britta Mccreedy calling about patient who has had cold uri for the last week. Started ZPak on Wed 08/25/09. Started with stomach pain, buring in chest and SOB 1 hour ago. Current BP 217/112 p.79. SOB worse when coming from bathroom few minutes ago. Pain constant for the last 30 min. Just took one ASA. Agrees to call 911. Protocol(s) Used: Chest Pain / Discomfort Recommended Outcome per Protocol: Activate EMS 911 Reason for Outcome: Chest pain/discomfort for more than 5 minutes, now or within last hour Care Advice:  ~ Do not give the patient anything to eat or drink. Have patient chew one aspirin tablet (325 mg), or 4 baby aspirin (81mg ) with a small amount of water now if patient conscious and not allergic to aspirin.  ~ Write down provider's name. List or place the following in a bag for transport with the patient: current prescription and/or OTC medications; alternative treatments, therapies and medications; and street drugs.  ~  ~ Inform provider if using any impotence medications (such as Viagra).  ~ Place patient in a position of comfort and loosen tight clothing.  Initial call taken by: Margaret Pyle, CMA,  August 30, 2009 3:02 PM  Follow-up for Phone Call        noted Follow-up by: Newt Lukes MD,  August 30, 2009 3:40 PM

## 2010-08-11 NOTE — Progress Notes (Signed)
Summary: referral   Phone Note Call from Patient   Caller:  Daughter Darl Pikes [phone] Summary of Call: pt's daughter called requesting that referral to have PT be done through [facility] instead of [facility] so that PT can be done in Pt's home. Initial call taken by: Margaret Pyle, CMA,  August 13, 2009 3:42 PM  Follow-up for Phone Call        ok - will order HHPT - please let pt know same (and ask [facility] to cancel [facility] referral for PT) Follow-up by: Newt Lukes MD,  August 13, 2009 4:18 PM  Additional Follow-up for Phone Call Additional follow up Details #1::        Darl Pikes informed via VM Additional Follow-up by: Margaret Pyle, CMA,  August 13, 2009 4:43 PM

## 2010-08-11 NOTE — Assessment & Plan Note (Signed)
Summary: [facility]--[facility]   Vital Signs:  Patient profile:   77 year old female Height:      64 inches Weight:      118.50 pounds BMI:     20.41 O2 Sat:      96 % on Room air Temp:     98.2 degrees F oral Pulse rate:   89 / minute BP sitting:   178 / 86  (left arm) Cuff size:   regular  Vitals Entered By: Margaret Pyle, CMA (July 08, 2010 4:12 PM)  O2 Flow:  Room air CC: HA x 1 week   Primary Care Provider:  Newt Lukes MD  CC:  HA x 1 week.  History of Present Illness: here for frontal sinus HA seen here 10d ago for cold symptoms - s/p zpak with transient improvement assoc purulent nasal discharge LGF initially, none now - +sick contacts  no neck pain, no confusion - no CP, cough or SOB  reviewed chronic med issues as well:  CHF - hosp 8/28-8/30/11 for acute d CHF symptoms - s/p diuresis and cards eval - no aggressive cardiac eval pirsued - symptoms improved (less SOB and periph edema) with diuresis - doing well since dc home - no recurrent edema or SOB, no CP - 100% compliant with meds   chronic pain - left hip (s/p fx and surg) and back (spinal stenosis) takes 2 vicodin daily - advised by ortho to get rx for same from PCP ambulatory with cane - no falls, no constipation or confusion  depression - much improved compared to prior visits - no sadness or fatigue - tol med well - no adv se  HTN - reports compliance with ongoing medical treatment and no changes in medication dose or frequency. denies adverse side effects related to current therapy. no HA or weakness  dyslipidemia - reports compliance with ongoing medical treatment and no changes in medication dose or frequency. denies adverse side effects related to current therapy. no myalgias  Allergies: 1)  ! * Spornax  Past History:  Past Medical History: Coronary artery disease Hyperlipidemia Hypertension Allergic rhinitis depression GERD   spinal stenosis w/ chronic back  pain macular degeneration osteoporosis (stopped fosamax indep, await bone density report) chronic diast CHF - echo 02/2010 in [facility]   MD roster: cards - [facility] - [facility]  neurosurg - Elsner   optho - Grevin ([facility]) ortho - Draper/Murphy  Review of Systems  The patient denies fever, weight loss, vision loss, decreased hearing, hoarseness, and syncope.    Physical Exam  General:  alert, well-developed, well-nourished, and cooperative to examination.  dtrinlaw at side Head:  mild tenderness to plap over B frontal sinuses Eyes:  vision grossly intact; pupils equal, round and reactive to light.  conjunctiva and lids normal.    Ears:  R ear normal and L ear normal., max sinus nontender   Nose:  no external deformity and no nasal discharge.   Mouth:  no gingival abnormalities and pharynx pink and moist.   but  no swelling, mass , no ulcers - dentures fit approp Lungs:  normal respiratory effort, no intercostal retractions or use of accessory muscles; normal breath sounds bilaterally - no crackles and no wheezes.  Heart:  normal rate, regular rhythm, no murmur, and no rub. BLE with trace edema at  L>R feet/ankles. Neurologic:  alert & oriented X3 and cranial nerves II-XII symetrically intact.  strength normal in all extremities, sensation intact to light touch, and gait normal. speech  fluent without dysarthria or aphasia; follows commands with good comprehension.    Impression & Recommendations:  Problem # 1:  ACUTE SINUSITIS, UNSPECIFIED (ICD-461.9)  persisting frontal ha despite zpak 10d ago no fever, neuro exam benign retx with augmentin + nasal steroid -erx done to call if unimproved after tx, sooner if worse  Her updated medication list for this problem includes:    Tessalon 200 Mg Caps (Benzonatate) .[name]... 1 by mouth three times a day for cough    Mucinex Dm Maximum Strength 60-1200 Mg Xr12h-tab (Dextromethorphan-guaifenesin) .[name]... 1 by mouth two times a day    Augmentin 875-125 Mg  Tabs (Amoxicillin-pot clavulanate) .[name]... 1 by mouth two times a day x 7 days    Nasonex 50 Mcg/act Susp (Mometasone furoate) .[name]... 1 spray each nostril  every morning  Instructed on treatment. Call if symptoms persist or worsen.   Orders: Prescription Created Electronically [phone])  Problem # 2:  HYPERTENSION (ICD-401.9)  uncontrolled today, likely due to decongestant use in past few days also pt attributes to pain... head, back, knee, hip... to watch at home and call f sbp>140s for med titration as needed, no change rx today  Her updated medication list for this problem includes:    Coreg 25 Mg Tabs (Carvedilol) .[name]... 1/2 tab by mouth two times a day    Quinapril Hcl 5 Mg Tabs (Quinapril hcl) .[name]... 1 by mouth once daily    Furosemide 40 Mg Tabs (Furosemide) .[name]... 1 by mouth once daily as needed for edema  BP today: 178/86 Prior BP: 142/72 (06/27/2010)  Labs Reviewed: K+: 3.5 (03/08/2010) Creat: : 0.73 (03/08/2010)     Complete Medication List: 1)  Zocor 40 Mg Tabs (Simvastatin) .... Take 1 at bedtime 2)  Coreg 25 Mg Tabs (Carvedilol) .... 1/2 tab by mouth two times a day 3)  Aspirin 81 Mg Tbec (Aspirin) .... Take 1 by mouth once daily 4)  Prilosec 20 Mg Cpdr (Omeprazole) .... Take 1 by mouth once daily 5)  Nitroglycerin 0.6 Mg/hr Pt24 (Nitroglycerin) .... Use prn 6)  Tramadol Hcl 50 Mg Tabs (Tramadol hcl) .... Take 1 three times a day as needed 7)  Fish Oil 1000 Mg Caps (Omega-3 fatty acids) .... Take 1 by mouth once daily 8)  Caltrate 600+d 600-400 Mg-unit Tabs (Calcium carbonate-vitamin d) .... Once daily 9)  [facility] Spectravite Tabs (Multiple vitamins-minerals) .... Once daily 10)  Vitamin D 1000 Unit Tabs (Cholecalciferol) .... Once daily 11)  Vitamin C 500 Mg Tabs (Ascorbic acid) .... Once daily 12)  Venlafaxine Hcl 75 Mg Xr24h-cap (Venlafaxine hcl) .[name].. 1 by mouth once daily 13)  Align Caps (Probiotic product) .[name].. 1 by mouth once daily 14)  Miralax Powd (Polyethylene  glycol 3350) .[name].. 17g (scoop) once daily with 8oz water 15)  Ambien 10 Mg Tabs (Zolpidem tartrate) .[name].. 1 by mouth at bedtime prn 16)  Quinapril Hcl 5 Mg Tabs (Quinapril hcl) .[name].. 1 by mouth once daily 17)  Tessalon 200 Mg Caps (Benzonatate) .[name].. 1 by mouth three times a day for cough 18)  Mucinex Dm Maximum Strength 60-1200 Mg Xr12h-tab (Dextromethorphan-guaifenesin) .[name].. 1 by mouth two times a day 19)  Furosemide 40 Mg Tabs (Furosemide) .[name].. 1 by mouth once daily as needed for edema 20)  Hydrocodone-acetaminophen 5-325 Mg Tabs (Hydrocodone-acetaminophen) .... Take 1 q 6 hours as needed 21)  Klor-con M20 20 Meq Cr-tabs (Potassium chloride crys cr) .[name].. 1 by mouth once daily with furosemide use as needed 22)  Augmentin 875-125 Mg  Tabs (Amoxicillin-pot clavulanate) .[name].. 1 by mouth two times a day x 7 days 23)  Nasonex 50 Mcg/act Susp (Mometasone furoate) .[name].. 1 spray each nostril  every morning  Patient Instructions: 1)  it was good to see you today. 2)  Augmentin and nasal steroid spray as discussed - your prescriptions have been electronically submitted to your pharmacy. Please take as directed. Contact our office if you believe you're having problems with the medication(s).  3)  Get plenty of rest, drink lots of clear liquids, and use Tylenol or Ibuprofen for fever and comfort. Return in 7-10 days if you're not better:sooner if you're feeling worse. 4)  Check your Blood Pressure regularly. If it is above: 140/85 you should make an appointment or call as discussed Prescriptions: NASONEX 50 MCG/ACT SUSP (MOMETASONE FUROATE) 1 spray each nostril  every morning  #1 x 0   Entered and Authorized by:   Newt Lukes MD   Signed by:   Newt Lukes MD on 07/08/2010   Method used:   Electronically to        [facility] (retail)       [address]       [place], [place]  [address]       Ph: [phone] or [phone]       Fax: [phone]   RxID:    [id number] AUGMENTIN 875-125 MG TABS (AMOXICILLIN-POT CLAVULANATE) 1 by mouth two times a day x 7 days  #14 x 0   Entered and Authorized by:   Newt Lukes MD   Signed by:   Newt Lukes MD on 07/08/2010   Method used:   Electronically to        [facility] (retail)       [address]       [place], [place]  [address]       Ph: [phone] or [phone]       Fax: [phone]   RxID:   [id number]    Orders Added: 1)  Est. Patient Level IV [37628] 2)  Prescription Created Electronically [phone]

## 2010-08-11 NOTE — Progress Notes (Signed)
Summary: hydrocodone  Phone Note Refill Request Message from:  Fax from Pharmacy on March 08, 2010 8:36 AM  Refills Requested: Medication #1:  Hydrocodone 5/325mg  take 1 q 6 hours prn # 30   Last Refilled: 02/24/2010 Med was  rx by Dr. Chipper Oman Is this ok to refill?  Next Appointment Scheduled: none Initial call taken by: Orlan Leavens RMA,  March 08, 2010 8:38 AM  Follow-up for Phone Call        yes - ok to fill as prev rx'd Follow-up by: Newt Lukes MD,  March 08, 2010 9:58 AM  Additional Follow-up for Phone Call Additional follow up Details #1::        Faxed paper request back to [facility] ok # 30 only. Updated EMR Additional Follow-up by: Orlan Leavens RMA,  March 08, 2010 10:07 AM    New/Updated Medications: HYDROCODONE-ACETAMINOPHEN 5-325 MG TABS (HYDROCODONE-ACETAMINOPHEN) take 1 q 6 hours as needed Prescriptions: HYDROCODONE-ACETAMINOPHEN 5-325 MG TABS (HYDROCODONE-ACETAMINOPHEN) take 1 q 6 hours as needed  #30 x 0   Entered by:   Orlan Leavens RMA   Authorized by:   Newt Lukes MD   Signed by:   Orlan Leavens RMA on 03/08/2010   Method used:   Historical   RxID:   [id number]

## 2010-08-11 NOTE — Letter (Signed)
Summary: [facility]  [facility]   Imported By: Sherian Rein 01/14/2010 10:24:57  _____________________________________________________________________  External Attachment:    Type:   Image     Comment:   External Document

## 2010-08-11 NOTE — Assessment & Plan Note (Signed)
Summary: wound lower leg/cd   Vital Signs:  Patient profile:   77 year old female Height:      64 inches (162.56 cm) Weight:      120.8 pounds (54.91 kg) O2 Sat:      94 % on Room air Temp:     98.5 degrees F (36.94 degrees C) oral Pulse rate:   88 / minute BP sitting:   142 / 72  (left arm) Cuff size:   regular  Vitals Entered By: Orlan Leavens (January 17, 2010 4:06 PM)  O2 Flow:  Room air CC: (L) wound lower leg Is Patient Diabetic? No Pain Assessment Patient in pain? no        Primary Care Provider:  Newt Lukes MD  CC:  (L) wound lower leg.  History of Present Illness: here with leg wound - onset 2 days ago - precipitated by minor trauma - ?hit back of leg against edge of chair leg associated with copious "weeping" - clear fluid from tear no bleeding ,no purulence - no fever or redness - +mild edema both feet -  no numbness or pain in legs  Current Medications (verified): 1)  Zocor 40 Mg Tabs (Simvastatin) .... Take 1 At Bedtime 2)  Coreg 25 Mg Tabs (Carvedilol) .... Hold 3)  Aspirin 81 Mg Tbec (Aspirin) .... Take 1 By Mouth Qd 4)  Prilosec 20 Mg Cpdr (Omeprazole) .... Take 1 Po Qd 5)  Nitroglycerin 0.6 Mg/hr Pt24 (Nitroglycerin) .... Use Prn 6)  Tramadol Hcl 50 Mg Tabs (Tramadol Hcl) .... Take 1 Three Times A Day As Needed 7)  Fish Oil 1000 Mg Caps (Omega-3 Fatty Acids) .... Take 1 By Mouth Qd 8)  Caltrate 600+d 600-400 Mg-Unit Tabs (Calcium Carbonate-Vitamin D) .... Once Daily 9)  [facility] Spectravite  Tabs (Multiple Vitamins-Minerals) .... Once Daily 10)  Vitamin D 1000 Unit Tabs (Cholecalciferol) .... Once Daily 11)  Vitamin C 500 Mg Tabs (Ascorbic Acid) .... Once Daily 12)  Venlafaxine Hcl 75 Mg Xr24h-Tab (Venlafaxine Hcl) .[name].. 1 By Mouth Once Daily - Start 09/10/09 13)  Align  Caps (Probiotic Product) .[name].. 1 By Mouth Once Daily 14)  Miralax  Powd (Polyethylene Glycol 3350) .[name].. 17g (Scoop) Once Daily With 8oz Water 15)  Ambien 10 Mg Tabs (Zolpidem Tartrate)  .[name].. 1 By Mouth At Bedtime Prn 16)  Quinapril Hcl 5 Mg Tabs (Quinapril Hcl) .... Hold 17)  Tessalon 200 Mg Caps (Benzonatate) .[name].. 1 By Mouth Three Times A Day For Cough 18)  Mucinex Dm Maximum Strength 60-1200 Mg Xr12h-Tab (Dextromethorphan-Guaifenesin) .[name].. 1 By Mouth Two Times A Day 19)  Lyrica 50 Mg Caps (Pregabalin) .... Hold  Allergies (verified): 1)  ! * Spornax  Past History:  Past Medical History: Coronary artery disease Hyperlipidemia Hypertension Allergic rhinitis depression GERD  spinal stenosis macular degeneration osteop.[name]. (stopped fosamax indep, await bone density report)   MD roster: cards - [facility] - [facility] neurosurg - Elsner   optho - Grevin ([facility]) ortho - [facility]per/Murphy  Review of Systems       The patient complains of peripheral edema.  The patient denies fever, weight loss, chest pain, and headaches.    Physical Exam  General:  alert, well-developed, well-nourished, and cooperative to examination.  dtr at side Lungs:  normal respiratory effort, no intercostal retractions or use of accessory muscles; normal breath sounds bilaterally - no crackles and no wheezes.  Heart:  normal rate, regular rhythm, no murmur, and no rub. BLE with trace edema at  feet/ankles. Skin:  small 1 cm skin tear posterior distal calf region - oozing clear fluid - no purulence, mild erythema diffuse but not warm to touch Psych:  Oriented X3, memory intact for recent and remote, normally interactive, good eye contact, not anxious appearing, not depressed appearing, and not agitated.      Impression & Recommendations:  Problem # 1:  EDEMA (ICD-782.3)  mild but contributing to weeping from skin tear - use low dose diuretic for limited time Her updated medication list for this problem includes:    Furosemide 20 Mg Tabs (Furosemide) .[name]... 1 by mouth once daily x 7 days, then as needed  Discussed elevation of the legs, use of compression stockings, sodium restiction, and  medication use.   Orders: Prescription Created Electronically [phone])  Problem # 2:  CELLULITIS AND ABSCESS OF LEG EXCEPT FOOT (ICD-682.6)  mild redness but risk progressive infx with open skin related to edema and tear - tx emperic 7 days abx -  dressing change discussed - Her updated medication list for this problem includes:    Cephalexin 250 Mg Caps (Cephalexin) .[name]... 1 by mouth three times a day x 7 days  Elevate affected area. Warm moist compresses for 20 minutes every 2 hours while awake. Take antibiotics as directed and take acetaminophen as needed. To be seen in 48-72 hours if no improvement, sooner if worse.  Orders: Prescription Created Electronically [phone])  Complete Medication List: 1)  Zocor 40 Mg Tabs (Simvastatin) .... Take 1 at bedtime 2)  Coreg 25 Mg Tabs (Carvedilol) .... 1/2 tab by mouth two times a day 3)  Aspirin 81 Mg Tbec (Aspirin) .... Take 1 by mouth once daily 4)  Prilosec 20 Mg Cpdr (Omeprazole) .... Take 1 by mouth once daily 5)  Nitroglycerin 0.6 Mg/hr Pt24 (Nitroglycerin) .... Use prn 6)  Tramadol Hcl 50 Mg Tabs (Tramadol hcl) .... Take 1 three times a day as needed 7)  Fish Oil 1000 Mg Caps (Omega-3 fatty acids) .... Take 1 by mouth once daily 8)  Caltrate 600+d 600-400 Mg-unit Tabs (Calcium carbonate-vitamin d) .... Once daily 9)  [facility]vs Spectravite Tabs (Multiple vitamins-minerals) .... Once daily 10)  Vitamin D 1000 Unit Tabs (Cholecalciferol) .... Once daily 11)  Vitamin C 500 Mg Tabs (Ascorbic acid) .... Once daily 12)  Venlafaxine Hcl 75 Mg Xr24h-tab (Venlafaxine hcl) .[name].. 1 by mouth once daily 13)  Align Caps (Probiotic product) .[name].. 1 by mouth once daily 14)  Miralax Powd (Polyethylene glycol 3350) .[name].. 17g (scoop) once daily with 8oz water 15)  Ambien 10 Mg Tabs (Zolpidem tartrate) .[name].. 1 by mouth at bedtime prn 16)  Quinapril Hcl 5 Mg Tabs (Quinapril hcl) .[name].. 1 by mouth once daily 17)  Tessalon 200 Mg Caps (Benzonatate) .[name].. 1 by mouth three  times a day for cough 18)  Mucinex Dm Maximum Strength 60-1200 Mg Xr12h-tab (Dextromethorphan-guaifenesin) .[name].. 1 by mouth two times a day 19)  Cephalexin 250 Mg Caps (Cephalexin) .[name].. 1 by mouth three times a day x 7 days 20)  Furosemide 20 Mg Tabs (Furosemide) .[name].. 1 by mouth once daily x 7 days, then as needed  Patient Instructions: 1)  it was good to see you today. 2)  antibiotics - kelfex - and diuretic for the fluid in your legs - 3)  cover the skin tear with gauze and paper tape - change as needed  4)  your prescriptions have been electronically submitted to your pharmacy. Please take as directed. Contact our office  if you believe you're having problems with the medication(s).  5)  good luck with your move - Prescriptions: FUROSEMIDE 20 MG TABS (FUROSEMIDE) 1 by mouth once daily x 7 days, then as needed  #30 x 1   Entered and Authorized by:   Newt Lukes MD   Signed by:   Newt Lukes MD on 01/17/2010   Method used:   Electronically to        [facility] (retail)       [address]       [place], [place]  [address]       Ph: [phone] or [phone]       Fax: [phone]   RxID:   [id number] CEPHALEXIN 250 MG CAPS (CEPHALEXIN) 1 by mouth three times a day x 7 days  #21 x 0   Entered and Authorized by:   Newt Lukes MD   Signed by:   Newt Lukes MD on 01/17/2010   Method used:   Electronically to        [facility] (retail)       [address]       [place], [place]  [id number]       Ph: [phone] or [phone]       Fax: [phone]   RxID:   [id number]

## 2010-08-11 NOTE — Assessment & Plan Note (Signed)
Summary: COLD/NWS   Vital Signs:  Patient profile:   77 year old female Height:      64 inches (162.56 cm) Weight:      119 pounds (54.09 kg) O2 Sat:      95 % on Room air Temp:     97.5 degrees F (36.39 degrees C) oral Pulse rate:   66 / minute BP sitting:   142 / 72  (left arm) Cuff size:   regular  Vitals Entered By: Orlan Leavens RMA (June 27, 2010 3:39 PM)  O2 Flow:  Room air CC: Cold sxs Is Patient Diabetic? No Pain Assessment Patient in pain? no        Primary Care Provider:  Newt Lukes MD  CC:  Cold sxs.  History of Present Illness: here for cold symptoms  onset 72 h ago - c/o frontal sinus pressure - assoc with sneezing and nasal discharge LGF initially, none now - +sick contacts - no Ha or neck pain, no confusion - no CP, cough or SOB  reviewed chronic med issues as well:  CHF - hosp 8/28-8/30/11 for acute d CHF symptoms - s/p diuresis and cards eval - no aggressive cardiac eval pirsued - symptoms improved (less SOB and periph edema) with diuresis - doing well since dc home - no recurrent edema or SOB, no CP - 100% compliant with meds   chronic pain - left hip (s/p fx and surg) and back (spinal stenosis) takes 2 vicodin daily - advised by ortho to get rx for same from PCP ambulatory with cane - no falls, no constipation or confusion  depression - much improved compared to prior visits - no sadness or fatigue - tol med well - ?re: short acting or XR?  HTN - reports compliance with ongoing medical treatment and no changes in medication dose or frequency. denies adverse side effects related to current therapy. no HA or weakness  dyslipidemia - reports compliance with ongoing medical treatment and no changes in medication dose or frequency. denies adverse side effects related to current therapy. no myalgias  Clinical Review Panels:  CBC   WBC:  6.9 (03/06/2010)   RBC:  4.05 (03/06/2010)   Hgb:  13.6 (03/06/2010)   Hct:  40.0  (03/06/2010)   Platelets:  233 (03/06/2010)   MCV  91.9 (03/06/2010)   MCHC  33.1 (09/27/2009)   RDW  14.1 (03/06/2010)   PMN:  68 (03/06/2010)   Lymphs:  13.0 (09/27/2009)   Monos:  9 (03/06/2010)   Eosinophils:  3 (03/06/2010)   Basophil:  1 (03/06/2010)  Complete Metabolic Panel   Glucose:  107 (03/08/2010)   Sodium:  139 (03/08/2010)   Potassium:  3.5 (03/08/2010)   Chloride:  99 (03/08/2010)   CO2:  31 (03/08/2010)   BUN:  24 (03/08/2010)   Creatinine:  0.73 (03/08/2010)   Albumin:  1.8 (10/08/2009)   Total Protein:  4.6 (10/08/2009)   Calcium:  9.0 (03/08/2010)   Total Bili:  0.6 (10/08/2009)   Alk Phos:  120 (10/08/2009)   SGPT (ALT):  23 (10/08/2009)   SGOT (AST):  20 (10/08/2009)   Current Medications (verified): 1)  Zocor 40 Mg Tabs (Simvastatin) .... Take 1 At Bedtime 2)  Coreg 25 Mg Tabs (Carvedilol) .... 1/2 Tab By Mouth Two Times A Day 3)  Aspirin 81 Mg Tbec (Aspirin) .... Take 1 By Mouth Once Daily 4)  Prilosec 20 Mg Cpdr (Omeprazole) .... Take 1 By Mouth Once Daily 5)  Nitroglycerin 0.6  Mg/hr Pt24 (Nitroglycerin) .... Use Prn 6)  Tramadol Hcl 50 Mg Tabs (Tramadol Hcl) .... Take 1 Three Times A Day As Needed 7)  Fish Oil 1000 Mg Caps (Omega-3 Fatty Acids) .... Take 1 By Mouth Once Daily 8)  Caltrate 600+d 600-400 Mg-Unit Tabs (Calcium Carbonate-Vitamin D) .... Once Daily 9)  Cvs Spectravite  Tabs (Multiple Vitamins-Minerals) .... Once Daily 10)  Vitamin D 1000 Unit Tabs (Cholecalciferol) .... Once Daily 11)  Vitamin C 500 Mg Tabs (Ascorbic Acid) .... Once Daily 12)  Venlafaxine Hcl 75 Mg Xr24h-Cap (Venlafaxine Hcl) .[name].. 1 By Mouth Once Daily 13)  Align  Caps (Probiotic Product) .[name].. 1 By Mouth Once Daily 14)  Miralax  Powd (Polyethylene Glycol 3350) .[name].. 17g (Scoop) Once Daily With 8oz Water 15)  Ambien 10 Mg Tabs (Zolpidem Tartrate) .[name].. 1 By Mouth At Bedtime Prn 16)  Quinapril Hcl 5 Mg Tabs (Quinapril Hcl) .[name].. 1 By Mouth Once Daily 17)  Tessalon 200 Mg  Caps (Benzonatate) .[name].. 1 By Mouth Three Times A Day For Cough 18)  Mucinex Dm Maximum Strength 60-1200 Mg Xr12h-Tab (Dextromethorphan-Guaifenesin) .[name].. 1 By Mouth Two Times A Day 19)  Furosemide 40 Mg Tabs (Furosemide) .[name].. 1 By Mouth Once Daily As Needed For Edema 20)  Hydrocodone-Acetaminophen 5-325 Mg Tabs (Hydrocodone-Acetaminophen) .... Take 1 Q 6 Hours As Needed 21)  Klor-Con M20 20 Meq Cr-Tabs (Potassium Chloride Crys Cr) .[name].. 1 By Mouth Once Daily With Furosemide Use As Needed  Allergies (verified): 1)  ! * Spornax  Past History:  Past Medical History: Coronary artery disease Hyperlipidemia Hypertension Allergic rhinitis depression GERD  spinal stenosis w/ chronic back pain macular degeneration osteoporosis (stopped fosamax indep, await bone density report) chronic diast CHF - echo 02/2010 in [facility]   MD roster: cards - [facility] - Golland  neurosurg - Elsner   optho - Grevin (wake) ortho - Draper/Murphy  Review of Systems  The patient denies anorexia, weight loss, vision loss, decreased hearing, hoarseness, peripheral edema, and hemoptysis.    Physical Exam  General:  alert, well-developed, well-nourished, and cooperative to examination.  son at side Head:  mild tenderness to plap over B frontal sinuses Eyes:  vision grossly intact; pupils equal, round and reactive to light.  conjunctiva and lids normal.    Ears:  R ear normal and L ear normal., sinus nontender   Mouth:  no gingival abnormalities and pharynx pink and moist.   but  no swelling, mass , no ulcers - dentures fit approp Lungs:  normal respiratory effort, no intercostal retractions or use of accessory muscles; normal breath sounds bilaterally - no crackles and no wheezes.  Heart:  normal rate, regular rhythm, no murmur, and no rub. BLE with trace edema at  L>R feet/ankles.   Impression & Recommendations:  Problem # 1:  ACUTE SINUSITIS, UNSPECIFIED (ICD-461.9)  suspect viral URI initially but inc nasal  discharge and sinus pain - given age and comorbidities, start early emperic abx tx for same - erx zpak done Her updated medication list for this problem includes:    Tessalon 200 Mg Caps (Benzonatate) .[name]... 1 by mouth three times a day for cough    Mucinex Dm Maximum Strength 60-1200 Mg Xr12h-tab (Dextromethorphan-guaifenesin) .[name]... 1 by mouth two times a day    Azithromycin 250 Mg Tabs (Azithromycin) .[name]... 2 tabs by mouth today, then 1 by mouth daily starting tomorrow  Instructed on treatment. Call if symptoms persist or worsen.   Orders: Prescription Created Electronically [id number])  Complete  Medication List: 1)  Zocor 40 Mg Tabs (Simvastatin) .... Take 1 at bedtime 2)  Coreg 25 Mg Tabs (Carvedilol) .... 1/2 tab by mouth two times a day 3)  Aspirin 81 Mg Tbec (Aspirin) .... Take 1 by mouth once daily 4)  Prilosec 20 Mg Cpdr (Omeprazole) .... Take 1 by mouth once daily 5)  Nitroglycerin 0.6 Mg/hr Pt24 (Nitroglycerin) .... Use prn 6)  Tramadol Hcl 50 Mg Tabs (Tramadol hcl) .... Take 1 three times a day as needed 7)  Fish Oil 1000 Mg Caps (Omega-3 fatty acids) .... Take 1 by mouth once daily 8)  Caltrate 600+d 600-400 Mg-unit Tabs (Calcium carbonate-vitamin d) .... Once daily 9)  Cvs Spectravite Tabs (Multiple vitamins-minerals) .... Once daily 10)  Vitamin D 1000 Unit Tabs (Cholecalciferol) .... Once daily 11)  Vitamin C 500 Mg Tabs (Ascorbic acid) .... Once daily 12)  Venlafaxine Hcl 75 Mg Xr24h-cap (Venlafaxine hcl) .[name].. 1 by mouth once daily 13)  Align Caps (Probiotic product) .[name].. 1 by mouth once daily 14)  Miralax Powd (Polyethylene glycol 3350) .[name].. 17g (scoop) once daily with 8oz water 15)  Ambien 10 Mg Tabs (Zolpidem tartrate) .[name].. 1 by mouth at bedtime prn 16)  Quinapril Hcl 5 Mg Tabs (Quinapril hcl) .[name].. 1 by mouth once daily 17)  Tessalon 200 Mg Caps (Benzonatate) .[name].. 1 by mouth three times a day for cough 18)  Mucinex Dm Maximum Strength 60-1200 Mg Xr12h-tab  (Dextromethorphan-guaifenesin) .[name].. 1 by mouth two times a day 19)  Furosemide 40 Mg Tabs (Furosemide) .[name].. 1 by mouth once daily as needed for edema 20)  Hydrocodone-acetaminophen 5-325 Mg Tabs (Hydrocodone-acetaminophen) .... Take 1 q 6 hours as needed 21)  Klor-con M20 20 Meq Cr-tabs (Potassium chloride crys cr) .[name].. 1 by mouth once daily with furosemide use as needed 22)  Azithromycin 250 Mg Tabs (Azithromycin) .... 2 tabs by mouth today, then 1 by mouth daily starting tomorrow  Patient Instructions: 1)  it was good to see you today. 2)  Zpak for sinus symptoms - your prescription has been electronically submitted to your pharmacy. Please take as directed. Contact our office if you believe you're having problems with the medication(s).  3)  Get plenty of rest, drink lots of clear liquids, and use Tylenol or Ibuprofen for fever and comfort. Return in 7-10 days if you're not better:sooner if you're feeling worse. Prescriptions: AZITHROMYCIN 250 MG TABS (AZITHROMYCIN) 2 tabs by mouth today, then 1 by mouth daily starting tomorrow  #6 x 0   Entered and Authorized by:   Newt Lukes MD   Signed by:   Newt Lukes MD on 06/27/2010   Method used:   Electronically to        [facility] (retail)       [address]       [place], [place]  [address]       Ph: [phone] or [phone]       Fax: [phone]   RxID:   [id number]    Orders Added: 1)  Est. Patient Level IV [36644] 2)  Prescription Created Electronically [phone]

## 2010-08-11 NOTE — Miscellaneous (Signed)
Summary: Plan of Treatment/[facility]  Plan of Treatment/[facility]   Imported By: Sherian Rein 01/21/2010 12:26:21  _____________________________________________________________________  External Attachment:    Type:   Image     Comment:   External Document

## 2010-08-11 NOTE — Progress Notes (Signed)
Summary: tramadol & simvastatin  Phone Note Refill Request Message from:  Fax from Pharmacy on April 04, 2010 1:39 PM  Refills Requested: Medication #1:  TRAMADOL HCL 50 MG TABS take 1 three times a day as needed # 90  Medication #2:  ZOCOR 40 MG TABS take 1 at bedtime # 30  Method Requested: Electronic Initial call taken by: Orlan Leavens RMA,  April 04, 2010 1:39 PM    Prescriptions: TRAMADOL HCL 50 MG TABS (TRAMADOL HCL) take 1 three times a day as needed  #90 x 1   Entered by:   Orlan Leavens RMA   Authorized by:   Newt Lukes MD   Signed by:   Orlan Leavens RMA on 04/04/2010   Method used:   Electronically to        [facility] (retail)       [address]       [place], [place]  [address]       Ph: [phone] or [phone]       Fax: [phone]   RxID:   [id number] ZOCOR 40 MG TABS (SIMVASTATIN) take 1 at bedtime  #30 x 5   Entered by:   Orlan Leavens RMA   Authorized by:   Newt Lukes MD   Signed by:   Orlan Leavens RMA on 04/04/2010   Method used:   Electronically to        [facility] (retail)       [address]       [place], [place]  [id number]       Ph: [phone] or [phone]       Fax: [phone]   RxID:   [id number]

## 2010-08-11 NOTE — Miscellaneous (Signed)
Summary: Medication Issue Communication/[facility]  Medication Issue Communication/[facility]   Imported By: Sherian Rein 02/18/2010 09:32:04  _____________________________________________________________________  External Attachment:    Type:   Image     Comment:   External Document

## 2010-08-11 NOTE — Assessment & Plan Note (Signed)
Summary: 1 WK FU-UTI /NWS   Vital Signs:  Patient profile:   77 year old female Height:      64 inches (162.56 cm) Weight:      120.8 pounds (54.91 kg) O2 Sat:      95 % on Room air Temp:     97.4 degrees F (36.33 degrees C) oral Pulse rate:   77 / minute BP sitting:   110 / 58  (left arm) Cuff size:   regular  Vitals Entered By: Orlan Leavens (January 03, 2010 1:33 PM)  O2 Flow:  Room air CC: 1 week f/u. Also f/u from ER visit she fell and hurt her hip Is Patient Diabetic? No Pain Assessment Patient in pain? yes     Location: hip Type: aching   Primary Care Provider:  Newt Lukes MD  CC:  1 week f/u. Also f/u from ER visit she fell and hurt her hip.  History of Present Illness: here for continued mouth and ear pain - left side seen 6/23 for same -  felt to be trigeminal neuralgia then - started on carbamazpime - pain not improved describes as continued sharp and shooting pains - wax/wane symptoms  now new blister on left cheeck and above upper lip on left side  seen in ER x 2 this weekend - 6/25 for UTI - on macrobid then 6/27 for fall and subsquent pelvic fx ( R inf pubic rami)  -  Date:  01/02/2010    WBC: 10.5    HGB: 11.8    HCT: 34.2    RBC: 3.71    PLT: 206    MCV: 92.3    RDW: 14.6    Neutrophil: 84    Lymphs: 10    Monos: 5    Eos: 0    Basophil: 0    BG Random: 115    BUN: 19    Creatinine: 0.6    Sodium: 133    Potassium: 4.0    Chloride: 103    CO2 Total: 25  Current Medications (verified): 1)  Cardizem Cd 180 Mg Xr24h-Cap (Diltiazem Hcl Coated Beads) .... Take 1 Po Qd 2)  Zocor 40 Mg Tabs (Simvastatin) .... Take 1 At Bedtime 3)  Coreg 25 Mg Tabs (Carvedilol) .... Take 1/2 Two Times A Day 4)  Aspirin 81 Mg Tbec (Aspirin) .... Take 1 By Mouth Qd 5)  Prilosec 20 Mg Cpdr (Omeprazole) .... Take 1 Po Qd 6)  Nitroglycerin 0.6 Mg/hr Pt24 (Nitroglycerin) .... Use Prn 7)  Tramadol Hcl 50 Mg Tabs (Tramadol Hcl) .... Take 1 Three Times A Day  As Needed 8)  Fish Oil 1000 Mg Caps (Omega-3 Fatty Acids) .... Take 1 By Mouth Qd 9)  Caltrate 600+d 600-400 Mg-Unit Tabs (Calcium Carbonate-Vitamin D) .... Once Daily 10)  [facility]vs Spectravite  Tabs (Multiple Vitamins-Minerals) .... Once Daily 11)  Vitamin D 1000 Unit Tabs (Cholecalciferol) .... Once Daily 12)  Vitamin C 500 Mg Tabs (Ascorbic Acid) .... Once Daily 13)  Venlafaxine Hcl 75 Mg Xr24h-Tab (Venlafaxine Hcl) .[name].. 1 By Mouth Once Daily - Start 09/10/09 14)  Align  Caps (Probiotic Product) .[name].. 1 By Mouth Once Daily 15)  Miralax  Powd (Polyethylene Glycol 3350) .[name].. 17g (Scoop) Once Daily With 8oz Water 16)  Ambien 10 Mg Tabs (Zolpidem Tartrate) .[name].. 1 By Mouth At Bedtime Prn 17)  Quinapril Hcl 5 Mg Tabs (Quinapril Hcl) .... Take 1 By Mouth Once Daily 18)  Carbamazepine 100 Mg Chew (Carbamazepine) .Marland KitchenMarland KitchenMarland Kitchen  1 By Mouth Two Times A Day 19)  Tessalon 200 Mg Caps (Benzonatate) .[name].. 1 By Mouth Three Times A Day For Cough 20)  Mucinex Dm Maximum Strength 60-1200 Mg Xr12h-Tab (Dextromethorphan-Guaifenesin) .[name].. 1 By Mouth Two Times A Day 21)  Nitrofurantoin Macrocrystal 100 Mg Caps (Nitrofurantoin Macrocrystal) .... Take 1 Two Times A Day For 5 Days  Allergies (verified): 1)  ! * Spornax  Past History:  Past Medical History: Coronary artery disease Hyperlipidemia Hypertension Allergic rhinitis depression GERD spinal stenosis macular degeneration osteop.[name]. (stopped fosamax indep, await bone density report)  MD roster: cards - [facility] - Ganji/Golland neurosurg - Elsner   optho - Grevin (wake) ortho - Draper/Murphy  Review of Systems  The patient denies fever, chest pain, syncope, and headaches.    Physical Exam  General:  alert, well-developed, well-nourished, and cooperative to examination.  dtr at side Lungs:  normal respiratory effort, no intercostal retractions or use of accessory muscles; normal breath sounds bilaterally - no crackles and no wheezes.  Heart:  normal rate, regular  rhythm, no murmur, and no rub. BLE without edema. Skin:  3 tiny vesicles over left face with erythema base - upper lip and check region Psych:  Oriented X3, memory intact for recent and remote, normally interactive, good eye contact, not anxious appearing, not depressed appearing, and not agitated.      Impression & Recommendations:  Problem # 1:  SHINGLES (ICD-053.9)  now clincal findings to support zoster neuralgia - stop tegretol - start valtrex and lyrica -  Orders: Prescription Created Electronically [phone])  Problem # 2:  FRACTURE, PELVIS, RIGHT (ICD-808.8) ER visit reviewed 6/26- nondisplaced pubic rami fx, right side - pain controlled over all with tramadol - agree with family to cont avoiding narcoticsas much as possible - agree with plans to hire private sitter for the evening to stay with pt for supervison as needed - pt happy with this plan  Complete Medication List: 1)  Zocor 40 Mg Tabs (Simvastatin) .... Take 1 at bedtime 2)  Coreg 25 Mg Tabs (Carvedilol) .... Take 1/2 two times a day 3)  Aspirin 81 Mg Tbec (Aspirin) .... Take 1 by mouth qd 4)  Prilosec 20 Mg Cpdr (Omeprazole) .... Take 1 po qd 5)  Nitroglycerin 0.6 Mg/hr Pt24 (Nitroglycerin) .... Use prn 6)  Tramadol Hcl 50 Mg Tabs (Tramadol hcl) .... Take 1 three times a day as needed 7)  Fish Oil 1000 Mg Caps (Omega-3 fatty acids) .... Take 1 by mouth qd 8)  Caltrate 600+d 600-400 Mg-unit Tabs (Calcium carbonate-vitamin d) .... Once daily 9)  Cvs Spectravite Tabs (Multiple vitamins-minerals) .... Once daily 10)  Vitamin D 1000 Unit Tabs (Cholecalciferol) .... Once daily 11)  Vitamin C 500 Mg Tabs (Ascorbic acid) .... Once daily 12)  Venlafaxine Hcl 75 Mg Xr24h-tab (Venlafaxine hcl) .[name].. 1 by mouth once daily - start 09/10/09 13)  Align Caps (Probiotic product) .[name].. 1 by mouth once daily 14)  Miralax Powd (Polyethylene glycol 3350) .[name].. 17g (scoop) once daily with 8oz water 15)  Ambien 10 Mg Tabs (Zolpidem tartrate)  .[name].. 1 by mouth at bedtime prn 16)  Quinapril Hcl 5 Mg Tabs (Quinapril hcl) .... Take 1 by mouth once daily 17)  Tessalon 200 Mg Caps (Benzonatate) .[name].. 1 by mouth three times a day for cough 18)  Mucinex Dm Maximum Strength 60-1200 Mg Xr12h-tab (Dextromethorphan-guaifenesin) .[name].. 1 by mouth two times a day 19)  Nitrofurantoin Macrocrystal 100 Mg Caps (Nitrofurantoin macrocrystal) .... Take 1 two  times a day for 5 days 20)  Lyrica 50 Mg Caps (Pregabalin) .[name].. 1 by mouth two times a day 21)  Valtrex 1 Gm Tabs (Valacyclovir hcl) .[name].. 1 by mouth three times a day x 7 days  Patient Instructions: 1)  it was good to see you again today.  2)  stop the cardizem and carbamazepine 3)  start Valtrex and Lyrica for the facial shingles and pain 4)  these new prescriptions have been given to you to submit to your pharmacy. Please take as directed. Contact our office if you believe you're having problems with the medication(s).  5)  ER records reviewed today 6)  continue the antibiotics for bladder infection - 7)  keep appointments with dr. Eulah Pont and dermatology as planned - 8)  Please schedule a follow-up appointment in 4 weeks, sooner if problems.  Prescriptions: VALTREX 1 GM TABS (VALACYCLOVIR HCL) 1 by mouth three times a day x 7 days  #21 x 0   Entered and Authorized by:   Newt Lukes MD   Signed by:   Newt Lukes MD on 01/03/2010   Method used:   Print then Give to Patient   RxID:   [id number] LYRICA 50 MG CAPS (PREGABALIN) 1 by mouth two times a day  #60 x 1   Entered and Authorized by:   Newt Lukes MD   Signed by:   Newt Lukes MD on 01/03/2010   Method used:   Print then Give to Patient   RxID:   [id number]    X-ray  Procedure date:  01/02/2010  Findings:      Exam Type: Pelvis 1-2 view IMPRESSION: ACUTE FRACTURES OF THE RIGHT INFERIOR AND SUPERIOR PUBIC RAMI   CXR  Procedure date:  01/02/2010  Findings:      Chest 1 view Impression;  The cardiomegaly and COPD. No acute findings

## 2010-08-11 NOTE — Assessment & Plan Note (Signed)
Summary: CHEST CONGEST--WEAK--COUGH--STC   Vital Signs:  Patient profile:   77 year old female Height:      65 inches (165.10 cm) Weight:      123.0 pounds (55.91 kg) O2 Sat:      94 % on Room air Temp:     97.4 degrees F (36.33 degrees C) oral Pulse rate:   74 / minute BP sitting:   124 / 72  (left arm) Cuff size:   regular  Vitals Entered By: Orlan Leavens (August 25, 2009 1:31 PM)  O2 Flow:  Room air CC: chest congestion/ cough/ weak Is Patient Diabetic? No Pain Assessment Patient in pain? no        Primary Care Provider:  Newt Lukes MD  CC:  chest congestion/ cough/ weak.  History of Present Illness: here today with complaint of  cough and chest congestion. onset of symptoms was 5 days ago. course has been sudden onset and now occurs in persisting pattern. problem precipitated by +sick contacts symptom characterized as head cold that went into my chest - problem associated with diarrhea initially, now fatigue, anorexia and ST; not associated with fever, HA, CP or SOB - no edema. symptoms improved by nothing - not using OTC meds. symptoms worsened with exertion. no prior hx of same symptoms in recent months.   Current Medications (verified): 1)  Cardizem Cd 180 Mg Xr24h-Cap (Diltiazem Hcl Coated Beads) .... Take 1 Po Qd 2)  Quinapril Hcl 20 Mg Tabs (Quinapril Hcl) .... Take 1 By Mouth Qd 3)  Tekturna 150 Mg Tabs (Aliskiren Fumarate) .... Take 1 Po Qd 4)  Zocor 40 Mg Tabs (Simvastatin) .... Take 1 At Bedtime 5)  Coreg 25 Mg Tabs (Carvedilol) .... Take 1/2 Two Times A Day 6)  Aspirin 81 Mg Tbec (Aspirin) .... Take 1 By Mouth Qd 7)  Prilosec 20 Mg Cpdr (Omeprazole) .... Take 1 Po Qd 8)  Nitroglycerin 0.6 Mg/hr Pt24 (Nitroglycerin) .... Use Prn 9)  Tramadol Hcl 50 Mg Tabs (Tramadol Hcl) .... Take 1 Three Times A Day As Needed 10)  Ra Arthritis Pain Relief 650 Mg Cr-Tabs (Acetaminophen) .... Take 6-8 By Mouth Once Daily 11)  Fish Oil 1000 Mg Caps (Omega-3  Fatty Acids) .... Take 1 By Mouth Qd 12)  Caltrate 600+d 600-400 Mg-Unit Tabs (Calcium Carbonate-Vitamin D) .... Once Daily 13)  [facility]vs Spectravite  Tabs (Multiple Vitamins-Minerals) .... Once Daily 14)  Vitamin D 1000 Unit Tabs (Cholecalciferol) .... Once Daily 15)  Vitamin C 500 Mg Tabs (Ascorbic Acid) .... Once Daily 16)  Venlafaxine Hcl 37.5 Mg Xr24h-Tab (Venlafaxine Hcl) .[name].. 1 By Mouth Once Daily X 2 Weeks, Then 2 By Mouth Once Daily 17)  Venlafaxine Hcl 75 Mg Xr24h-Tab (Venlafaxine Hcl) .[name].. 1 By Mouth Once Daily - Start 09/10/09 18)  Align  Caps (Probiotic Product) .[name].. 1 By Mouth Once Daily 19)  Miralax  Powd (Polyethylene Glycol 3350) .[name].. 17g (Scoop) Once Daily With 8oz Water  Allergies (verified): 1)  ! * Spornax  Past History:  Past Medical History: Reviewed history from 08/13/2009 and no changes required. Coronary artery disease Hyperlipidemia Hypertension Allergic rhinitis depression GERD spinal stenosis macular degeneration osteop.[name]. (stopped fosamax indep, await bone density report)  MD rooster: cards - [facility] - [facility] neuros[facility] - [facility] - [facility] - [facility]  Review of Systems  The patient denies vision loss, syncope, peripheral edema, and abdominal pain.    Physical Exam  General:  alert, well-developed, well-nourished, and cooperative to  examination.   sister at side Eyes:  vision grossly intact; pupils equal, round and reactive to light.  conjunctiva and lids normal.    Ears:  normal pinnae bilaterally, without erythema, swelling, or tenderness to palpation. TMs clear, without effusion, or cerumen impaction. Hearing grossly normal bilaterally  Mouth:  +viral appearing vessicles on uvula - mod erythema but no exudate Lungs:  normal respiratory effort, no intercostal retractions or use of accessory muscles; normal breath sounds bilaterally - no crackles and no wheezes.    Heart:  normal rate, regular rhythm, no murmur, and no rub. BLE  without edema Neurologic:  alert & oriented X3 and cranial nerves II-XII symetrically intact.  strength normal in all extremities, sensation intact to light touch, and gait normal. speech fluent without dysarthria or aphasia; follows commands with good comprehension.  Psych:  Oriented X3, memory intact for recent and remote, normally interactive, good eye contact, not anxious appearing, min depressed appearing, and not agitated.      Impression & Recommendations:  Problem # 1:  BRONCHITIS, VIRAL (ICD-466.0)  vesicles and ST with cough make this appear viral - but no high fever - as course of symptoms >5 days, doubt benefit of Tamiflu or other antiviral - given age, will tx with emperic zpak to protect from atypical organism bronchitis possiblity encouraged to keep hydrated -  also magic mouthwash for assoc pharyngitis Her updated medication list for this problem includes:    Azithromycin 250 Mg Tabs (Azithromycin) .[name]... 2 tabs by mouth today, then 1 by mouth daily starting tomorrow  Take antibiotics and other medications as directed. Encouraged to push clear liquids, get enough rest, and take acetaminophen as needed. To be seen in 5-7 days if no improvement, sooner if worse.  Orders: Prescription Created Electronically [phone])  Problem # 2:  HYPERTENSION (ICD-401.9)  ?if any substitute for [place] d/t cost concerns - explained at this time tere is no "equivalent" medication - but can consider trying alt meds in other classes if neeeded - pt wishes to remain on same at this time as med began by her cardiologist and BP well controlled-  samples given to help defray expenses on limited basis - will cont to follow Her updated medication list for this problem includes:    Cardizem Cd 180 Mg Xr24h-cap (Diltiazem hcl coated beads) .[name]... Take 1 po qd    Quinapril Hcl 20 Mg Tabs (Quinapril hcl) .[name]... Take 1 by mouth qd    Tekturna 150 Mg Tabs (Aliskiren fumarate) .[name]... Take 1 po qd    Coreg 25  Mg Tabs (Carvedilol) .[name]... Take 1/2 two times a day  BP today: 124/72 Prior BP: 132/70 (08/13/2009)  Labs Reviewed: K+: 3.7 (03/30/2008) Creat: : 0.62 (03/30/2008)     Complete Medication List: 1)  Cardizem Cd 180 Mg Xr24h-cap (Diltiazem hcl coated beads) .... Take 1 po qd 2)  Quinapril Hcl 20 Mg Tabs (Quinapril hcl) .... Take 1 by mouth qd 3)  Tekturna 150 Mg Tabs (Aliskiren fumarate) .... Take 1 po qd 4)  Zocor 40 Mg Tabs (Simvastatin) .... Take 1 at bedtime 5)  Coreg 25 Mg Tabs (Carvedilol) .... Take 1/2 two times a day 6)  Aspirin 81 Mg Tbec (Aspirin) .... Take 1 by mouth qd 7)  Prilosec 20 Mg Cpdr (Omeprazole) .... Take 1 po qd 8)  Nitroglycerin 0.6 Mg/hr Pt24 (Nitroglycerin) .... Use prn 9)  Tramadol Hcl 50 Mg Tabs (Tramadol hcl) .... Take 1 three times a day as needed 10)  Ra Arthritis Pain Relief 650 Mg Cr-tabs (Acetaminophen) .... Take 6-8 by mouth once daily 11)  Fish Oil 1000 Mg Caps (Omega-3 fatty acids) .... Take 1 by mouth qd 12)  Caltrate 600+d 600-400 Mg-unit Tabs (Calcium carbonate-vitamin d) .... Once daily 13)  Cvs Spectravite Tabs (Multiple vitamins-minerals) .... Once daily 14)  Vitamin D 1000 Unit Tabs (Cholecalciferol) .... Once daily 15)  Vitamin C 500 Mg Tabs (Ascorbic acid) .... Once daily 16)  Venlafaxine Hcl 37.5 Mg Xr24h-tab (Venlafaxine hcl) .[name].. 1 by mouth once daily x 2 weeks, then 2 by mouth once daily 17)  Venlafaxine Hcl 75 Mg Xr24h-tab (Venlafaxine hcl) .[name].. 1 by mouth once daily - start 09/10/09 18)  Align Caps (Probiotic product) .[name].. 1 by mouth once daily 19)  Miralax Powd (Polyethylene glycol 3350) .[name].. 17g (scoop) once daily with 8oz water 20)  Azithromycin 250 Mg Tabs (Azithromycin) .... 2 tabs by mouth today, then 1 by mouth daily starting tomorrow 21)  Magic Mouthwash  .... 5 cc by mouth three times a day (swish and swallow) x 5 days - then as needed  Patient Instructions: 1)  it was good to see you today. 2)  antibiotics - Zpack - your  prescription has been electronically submitted to your pharmacy. Please take as directed. Contact our office if you believe you're having problems with the medication(s).  3)  Also prescription for magic mouthwash to swish and swallow to help with sore throat - 4)  keep hydrated as discussed - 5)  samples of tekturna provided - let us know if we need to change this to different medication for blood pressure 6)  if your symptoms continue to worsen (pain, fever, weakeness, etc), or if you are unable take anything by mouth (pills, fluids, etc), you should go to the emergency room for further evaluation and treatment.  Prescriptions: MAGIC MOUTHWASH 5 cc by mouth three times a day (swish and swallow) x 5 days - then as needed  #200cc x 0   Entered and Authorized by:   Newt Lukes MD   Signed by:   Newt Lukes MD on 08/25/2009   Method used:   Print then Give to Patient   RxID:   [id number] AZITHROMYCIN 250 MG TABS (AZITHROMYCIN) 2 tabs by mouth today, then 1 by mouth daily starting tomorrow  #6 x 0   Entered and Authorized by:   Newt Lukes MD   Signed by:   Newt Lukes MD on 08/25/2009   Method used:   Electronically to        [facility] [address] [phone]* (retail)       [address]       [place], [place]  [address]       Ph: [phone] or [phone]       Fax: [phone]   RxID:   [id number]

## 2010-08-11 NOTE — Miscellaneous (Signed)
Summary: [facility]   [facility]ician's Assessment/[facility]   Imported By: Sherian Rein 01/14/2010 10:45:42  _____________________________________________________________________  External Attachment:    Type:   Image     Comment:   External Document

## 2010-08-11 NOTE — Letter (Signed)
Summary: [facility]  [facility]   Imported By: Lennie Odor 02/09/2010 11:06:57  _____________________________________________________________________  External Attachment:    Type:   Image     Comment:   External Document

## 2010-08-11 NOTE — Assessment & Plan Note (Signed)
Summary: 1 wk of congestion/cough/leschber/cd   Vital Signs:  Patient profile:   77 year old female Height:      64 inches Weight:      115.50 pounds BMI:     19.90 O2 Sat:      96 % on Room air Temp:     96.9 degrees F oral Pulse rate:   93 / minute BP sitting:   102 / 70  (left arm) Cuff size:   regular  Vitals Entered ByZella Ball Ewing (December 23, 2009 11:09 AM)  O2 Flow:  Room air CC: congestion, cough, body aches for 1 week/RE   Primary Care Provider:  Newt Lukes MD  CC:  congestion, cough, and body aches for 1 week/RE.  History of Present Illness: here for acute visit - c/o 1 wk acute onset, mild to mod, gradually worsening fever, headache, ST, general weakness and malaise , as well as 2 to 3 days increased prod cough with greenish sputum, and mild wheezing, mild sob  but no significant DOE.  Lives in retirement community - someone [place] sick there it seems.  Pt denies CP,  orthopnea, pnd, worsening LE edema, palps, dizziness or syncope .  Pt denies new neuro symptoms such as headache, facial or extremity weakness  No recent falls or injury.  Problems Prior to Update: 1)  Bronchitis-acute  (ICD-466.0) 2)  Laceration, Foot  (ICD-892.0) 3)  Abdominal Pain  (ICD-789.00) 4)  Hip Fracture, Left  (ICD-820.8) 5)  Other Specified Disorders of Biliary Tract  (ICD-576.8) 6)  Transaminases, Serum, Elevated  (ICD-790.4) 7)  Abdominal Pain, Generalized  (ICD-789.07) 8)  Constipation  (ICD-564.00) 9)  Gerd  (ICD-530.81) 10)  Depression  (ICD-311) 11)  Spinal Stenosis  (ICD-724.00) 12)  Hypertension  (ICD-401.9) 13)  Hyperlipidemia  (ICD-272.4) 14)  Coronary Artery Disease  (ICD-414.00) 15)  Arthritis  (ICD-716.90) 16)  Tobacco Use, Quit  (ICD-V15.82) 17)  Carpal Tunnel Syndrome  (ICD-354.0) 18)  Allergic Rhinitis  (ICD-477.9) 19)  Irregular Heart Rate  (ICD-427.9) 20)  Varicose Vein  (ICD-456.8) 21)  Cardiac Murmur  (ICD-785.2)  Medications Prior to Update: 1)   Cardizem Cd 180 Mg Xr24h-Cap (Diltiazem Hcl Coated Beads) .... Take 1 Po Qd 2)  Zocor 40 Mg Tabs (Simvastatin) .... Take 1 At Bedtime 3)  Coreg 25 Mg Tabs (Carvedilol) .... Take 1/2 Two Times A Day 4)  Aspirin 81 Mg Tbec (Aspirin) .... Take 1 By Mouth Qd 5)  Prilosec 20 Mg Cpdr (Omeprazole) .... Take 1 Po Qd 6)  Nitroglycerin 0.6 Mg/hr Pt24 (Nitroglycerin) .... Use Prn 7)  Tramadol Hcl 50 Mg Tabs (Tramadol Hcl) .... Take 1 Three Times A Day As Needed 8)  Fish Oil 1000 Mg Caps (Omega-3 Fatty Acids) .... Take 1 By Mouth Qd 9)  Caltrate 600+d 600-400 Mg-Unit Tabs (Calcium Carbonate-Vitamin D) .... Once Daily 10)  Cvs Spectravite  Tabs (Multiple Vitamins-Minerals) .... Once Daily 11)  Vitamin D 1000 Unit Tabs (Cholecalciferol) .... Once Daily 12)  Vitamin C 500 Mg Tabs (Ascorbic Acid) .... Once Daily 13)  Venlafaxine Hcl 75 Mg Xr24h-Tab (Venlafaxine Hcl) .[name].. 1 By Mouth Once Daily - Start 09/10/09 14)  Align  Caps (Probiotic Product) .[name].. 1 By Mouth Once Daily 15)  Miralax  Powd (Polyethylene Glycol 3350) .[name].. 17g (Scoop) Once Daily With 8oz Water 16)  Ambien 10 Mg Tabs (Zolpidem Tartrate) .[name].. 1 By Mouth At Bedtime Prn 17)  Hydrocodone-Acetaminophen 5-325 Mg Tabs (Hydrocodone-Acetaminophen) .... 1/2-1 Tab By Mouth Every  4-6 Hours As Needed For Severe Pain 18)  Quinapril Hcl 5 Mg Tabs (Quinapril Hcl) .... Take 1 By Mouth Once Daily  Current Medications (verified): 1)  Cardizem Cd 180 Mg Xr24h-Cap (Diltiazem Hcl Coated Beads) .... Take 1 Po Qd 2)  Zocor 40 Mg Tabs (Simvastatin) .... Take 1 At Bedtime 3)  Coreg 25 Mg Tabs (Carvedilol) .... Take 1/2 Two Times A Day 4)  Aspirin 81 Mg Tbec (Aspirin) .... Take 1 By Mouth Qd 5)  Prilosec 20 Mg Cpdr (Omeprazole) .... Take 1 Po Qd 6)  Nitroglycerin 0.6 Mg/hr Pt24 (Nitroglycerin) .... Use Prn 7)  Tramadol Hcl 50 Mg Tabs (Tramadol Hcl) .... Take 1 Three Times A Day As Needed 8)  Fish Oil 1000 Mg Caps (Omega-3 Fatty Acids) .... Take 1 By Mouth Qd 9)   Caltrate 600+d 600-400 Mg-Unit Tabs (Calcium Carbonate-Vitamin D) .... Once Daily 10)  Cvs Spectravite  Tabs (Multiple Vitamins-Minerals) .... Once Daily 11)  Vitamin D 1000 Unit Tabs (Cholecalciferol) .... Once Daily 12)  Vitamin C 500 Mg Tabs (Ascorbic Acid) .... Once Daily 13)  Venlafaxine Hcl 75 Mg Xr24h-Tab (Venlafaxine Hcl) .[name].. 1 By Mouth Once Daily - Start 09/10/09 14)  Align  Caps (Probiotic Product) .[name].. 1 By Mouth Once Daily 15)  Miralax  Powd (Polyethylene Glycol 3350) .[name].. 17g (Scoop) Once Daily With 8oz Water 16)  Ambien 10 Mg Tabs (Zolpidem Tartrate) .[name].. 1 By Mouth At Bedtime Prn 17)  Hydrocodone-Acetaminophen 5-325 Mg Tabs (Hydrocodone-Acetaminophen) .... 1/2-1 Tab By Mouth Every 4-6 Hours As Needed For Severe Pain 18)  Quinapril Hcl 5 Mg Tabs (Quinapril Hcl) .... Take 1 By Mouth Once Daily 19)  Levaquin 500 Mg Tabs (Levofloxacin) .[name].. 1po Once Daily 20)  Hydrocodone-Homatropine 5-1.5 Mg/25ml Syrp (Hydrocodone-Homatropine) .... 1/2 - 1 Tsp By Mouth Q 6 Hrs As Needed Cough  Allergies (verified): 1)  ! * Spornax  Past History:  Past Medical History: Last updated: 12/03/2009 Coronary artery disease Hyperlipidemia Hypertension Allergic rhinitis depression GERD spinal stenosis macular degeneration osteop.[name]. 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  , bright , alert  and excellent cognition/ not confused but fatigued apperaring and needs some assist with getting up on the exam table Head:  normocephalic and atraumatic.   Eyes:  vision grossly intact, pupils equal, and pupils round.   Ears:  R ear normal and L ear normal., sinus nontender   Nose:  no external deformity and no nasal discharge.   Mouth:  no gingival abnormalities and pharynx pink and moist.   but  no swelling, mass  Neck:  supple and no masses.   Lungs:  normal respiratory effort, normal breath sounds, except  R decreased breath sounds and few right basilar crackles Heart:  normal rate and regular rhythm.   Extremities:  no  edema, no erythema    Impression & Recommendations:  Problem # 1:  BRONCHITIS-ACUTE (ICD-466.0)  Orders: T-2 View CXR, Same Day (71020.5TC)  Her updated medication list for this problem includes:    Levaquin 500 Mg Tabs (Levofloxacin) .[name]... 1po once daily    Hydrocodone-homatropine 5-1.5 Mg/10ml Syrp (Hydrocodone-homatropine) .[name]... 1/2 - 1 tsp by mouth q 6 hrs as needed cough cant r/o pna given hx and exam - treat as above, f/u any worsening signs or symptoms, also check cxr  Problem # 2:  HYPERTENSION (ICD-401.9)  Her updated medication list for this problem includes:    Cardizem Cd 180 Mg Xr24h-cap (Diltiazem hcl coated beads) .[name]... Take 1 po qd    Coreg 25 Mg Tabs (Carvedilol) .[name]... Take 1/2 two times a day    Quinapril Hcl 5 Mg Tabs (Quinapril hcl) .[name]... Take 1 by mouth once daily  BP today: 102/70 Prior BP: 102/62 (12/03/2009)  Labs Reviewed: K+: 4.0 (10/08/2009) Creat: : 0.56 (10/08/2009)    stable overall by hx and exam, ok to continue meds/tx as is   Complete Medication List: 1)  Cardizem Cd 180 Mg Xr24h-cap (Diltiazem hcl coated beads) .... Take 1 po qd 2)  Zocor 40 Mg Tabs (Simvastatin) .... Take 1  at bedtime 3)  Coreg 25 Mg Tabs (Carvedilol) .... Take 1/2 two times a day 4)  Aspirin 81 Mg Tbec (Aspirin) .... Take 1 by mouth qd 5)  Prilosec 20 Mg Cpdr (Omeprazole) .... Take 1 po qd 6)  Nitroglycerin 0.6 Mg/hr Pt24 (Nitroglycerin) .... Use prn 7)  Tramadol Hcl 50 Mg Tabs (Tramadol hcl) .... Take 1 three times a day as needed 8)  Fish Oil 1000 Mg Caps (Omega-3 fatty acids) .... Take 1 by mouth qd 9)  Caltrate 600+d 600-400 Mg-unit Tabs (Calcium carbonate-vitamin d) .... Once daily 10)  Cvs Spectravite Tabs (Multiple vitamins-minerals) .... Once daily 11)  Vitamin D 1000 Unit Tabs (Cholecalciferol) .... Once daily 12)  Vitamin C 500 Mg Tabs (Ascorbic acid) .... Once daily 13)  Venlafaxine Hcl 75 Mg Xr24h-tab (Venlafaxine hcl) .[name].. 1 by mouth once daily - start 09/10/09 14)  Align Caps (Probiotic product) .[name].. 1 by mouth once daily 15)  Miralax Powd (Polyethylene glycol 3350) .[name].. 17g (scoop) once daily with 8oz water 16)  Ambien 10 Mg Tabs (Zolpidem tartrate) .[name].. 1 by mouth at bedtime prn 17)  Hydrocodone-acetaminophen 5-325 Mg Tabs (Hydrocodone-acetaminophen) .... 1/2-1 tab by mouth every 4-6 hours as needed for severe pain 18)  Quinapril Hcl 5 Mg Tabs (Quinapril hcl) .... Take 1 by mouth once daily 19)  Levaquin 500 Mg Tabs (Levofloxacin) .[name].. 1po once daily 20)  Hydrocodone-homatropine 5-1.5 Mg/76ml Syrp (Hydrocodone-homatropine) .... 1/2 - 1 tsp by mouth q 6 hrs as needed cough  Patient Instructions: 1)  Please take all new medications as prescribed 2)  Continue all previous medications as before this visit  3)  You can also use Mucinex OTC or it's generic for congestion  4)  Please go to Radiology in the basement level for your X-Ray today  5)  Please schedule an appointment with your primary doctor as needed Prescriptions: HYDROCODONE-HOMATROPINE 5-1.5 MG/5ML SYRP (HYDROCODONE-HOMATROPINE) 1/2 - 1 tsp by mouth q 6 hrs as needed cough  #6 oz x 1   Entered and Authorized by:   Corwin Levins MD   Signed by:   Corwin Levins MD on 12/23/2009   Method used:   Print then Give to Patient   RxID:  [id number] LEVAQUIN 500 MG TABS (LEVOFLOXACIN) 1po once daily  #10 x 0   Entered and Authorized by:   Corwin Levins MD   Signed by:   Corwin Levins MD on 12/23/2009   Method used:   Print then Give to Patient   RxID:   [id number]

## 2010-08-11 NOTE — Letter (Signed)
Summary: [facility]  [facility]   Imported By: Lester Avinger 03/18/2010 09:00:13  _____________________________________________________________________  External Attachment:    Type:   Image     Comment:   External Document

## 2010-08-11 NOTE — Miscellaneous (Signed)
Summary: Order / [facility]  Order / [facility]   Imported By: Lennie Odor 01/19/2010 16:50:42  _____________________________________________________________________  External Attachment:    Type:   Image     Comment:   External Document

## 2010-08-11 NOTE — Progress Notes (Signed)
Summary: omeprazole  Phone Note Refill Request Message from:  Fax from Pharmacy on April 19, 2010 1:38 PM  Refills Requested: Medication #1:  omeprazole 20mg  take 1 po qd # 30 [facility] [phone] Med is not on med list. Is this ok to refill?  Initial call taken by: Orlan Leavens RMA,  April 19, 2010 1:41 PM  Follow-up for Phone Call        med is on list as "prilosec" - yes, ok to fill as requested - thanks Follow-up by: Newt Lukes MD,  April 19, 2010 2:54 PM    Prescriptions: PRILOSEC 20 MG CPDR (OMEPRAZOLE) take 1 by mouth once daily  #30 x 5   Entered by:   Orlan Leavens RMA   Authorized by:   Newt Lukes MD   Signed by:   Orlan Leavens RMA on 04/19/2010   Method used:   Electronically to        [facility]* (retail)       [address]       [place], [place]  [id number]       Ph: [phone] or [phone]       Fax: [phone]   RxID:   [id number]

## 2010-08-11 NOTE — Assessment & Plan Note (Signed)
Summary: [facility]---FELL BROKE HIP  STC   Vital Signs:  Patient profile:   77 year old female Height:      65 inches (165.10 cm) Weight:      117.12 pounds (53.24 kg) BMI:     19.56 O2 Sat:      97 % on Room air Temp:     97.0 degrees F (36.11 degrees C) oral Pulse rate:   89 / minute BP sitting:   102 / 62  (left arm) Cuff size:   regular  Vitals Entered By: Orlan Leavens (Dec 03, 2009 11:00 AM)  O2 Flow:  Room air CC: Hosp follow-up Is Patient Diabetic? No Pain Assessment Patient in pain? no        Primary Care Provider:  Newt Lukes MD  CC:  Hosp follow-up.  History of Present Illness: at [facility] 3/27-4/1 for l hip fx s/p accidental fall - tx Lhemiarthroplasy   at SNF, now in indep living senior apt complex (prev lives alone in her own home)  1) L  hip fx - recovering well and progressing with HHPT - using RW with seat as instructed - following hip precautions - due to see ortho for f/u and hopeful release in 3 weeks  2) concernd about DM - dx with same during hosp (dc summarry reviewed) a1c 5.7 - no prior hx DM - not checking sugars - no PU/PD  3) c/o skin tear - occured after accidental impact to that area - no bleeding or redness - no swelling of leg/foot - treatment ongoing with HH RN  4) abd pain - see prior notes - felt to be GB etiology s/p ERCP during hosp (coincidental during hosp for fx) - since procedure, all GB pain goine - no n/v - tol by mouth well  Clinical Review Panels:  Diabetes Management   HgBA1C:  5.7 (10/05/2009)   Creatinine:  0.56 (10/08/2009)   Last Flu Vaccine:  Historical (04/09/2009)   Last Pneumovax:  Historical (07/11/2007)  CBC   WBC:  9.6 (10/08/2009)   RBC:  2.47 (10/08/2009)   Hgb:  8.2 (10/08/2009)   Hct:  24.3 (10/08/2009)   Platelets:  325 (10/08/2009)   MCV  98.8 (09/27/2009)   MCHC  33.1 (09/27/2009)   RDW  14.3 (09/27/2009)   PMN:  90 (09/27/2009)   Lymphs:  13.0 (09/27/2009)   Monos:  6  (09/27/2009)   Eosinophils:  0 (09/27/2009)   Basophil:  0 (09/27/2009)  Complete Metabolic Panel   Glucose:  102 (10/08/2009)   Sodium:  135 (10/08/2009)   Potassium:  4.0 (10/08/2009)   Chloride:  102 (10/08/2009)   CO2:  28 (10/08/2009)   BUN:  16 (10/08/2009)   Creatinine:  0.56 (10/08/2009)   Albumin:  1.8 (10/08/2009)   Total Protein:  4.6 (10/08/2009)   Calcium:  8.0 (10/08/2009)   Total Bili:  0.6 (10/08/2009)   Alk Phos:  120 (10/08/2009)   SGPT (ALT):  23 (10/08/2009)   SGOT (AST):  20 (10/08/2009)   -  Date:  10/05/2009    HgbA1c: 5.7  Current Medications (verified): 1)  Cardizem Cd 180 Mg Xr24h-Cap (Diltiazem Hcl Coated Beads) .... Take 1 Po Qd 2)  Zocor 40 Mg Tabs (Simvastatin) .... Take 1 At Bedtime 3)  Coreg 25 Mg Tabs (Carvedilol) .... Take 1/2 Two Times A Day 4)  Aspirin 81 Mg Tbec (Aspirin) .... Take 1 By Mouth Qd 5)  Prilosec 20 Mg Cpdr (Omeprazole) .... Take  1 Po Qd 6)  Nitroglycerin 0.6 Mg/hr Pt24 (Nitroglycerin) .... Use Prn 7)  Tramadol Hcl 50 Mg Tabs (Tramadol Hcl) .... Take 1 Three Times A Day As Needed 8)  Fish Oil 1000 Mg Caps (Omega-3 Fatty Acids) .... Take 1 By Mouth Qd 9)  Caltrate 600+d 600-400 Mg-Unit Tabs (Calcium Carbonate-Vitamin D) .... Once Daily 10)  Cvs Spectravite  Tabs (Multiple Vitamins-Minerals) .... Once Daily 11)  Vitamin D 1000 Unit Tabs (Cholecalciferol) .... Once Daily 12)  Vitamin C 500 Mg Tabs (Ascorbic Acid) .... Once Daily 13)  Venlafaxine Hcl 75 Mg Xr24h-Tab (Venlafaxine Hcl) .[name].. 1 By Mouth Once Daily - Start 09/10/09 14)  Align  Caps (Probiotic Product) .[name].. 1 By Mouth Once Daily 15)  Miralax  Powd (Polyethylene Glycol 3350) .[name].. 17g (Scoop) Once Daily With 8oz Water 16)  Ambien 10 Mg Tabs (Zolpidem Tartrate) .[name].. 1 By Mouth At Bedtime Prn 17)  Hydrocodone-Acetaminophen 5-325 Mg Tabs (Hydrocodone-Acetaminophen) .... 1/2-1 Tab By Mouth Every 4-6 Hours As Needed For Severe Pain 18)  Quinapril Hcl 5 Mg Tabs (Quinapril Hcl)  .... Take 1 By Mouth Once Daily  Allergies (verified): 1)  ! * Spornax  Past History:  Past Medical History: Coronary artery disease Hyperlipidemia Hypertension Allergic rhinitis depression GERD spinal stenosis macular degeneration osteop.[name]. (stopped fosamax indep, await bone density report)   MD rooster: cards - [facility] - [facility] neurosurg - [facility]   optho - [facility] ([facility]) ortho - [facility]  Past Surgical History: Coronary artery bypass graft, 2004 Appendectomy (1940) L hip hemiarthroplasty (09/3009)  Review of Systems  The patient denies anorexia, fever, vision loss, chest pain, syncope, peripheral edema, and headaches.    Physical Exam  General:  alert, well-developed, well-nourished, and cooperative to examination.   dtr at side Lungs:  normal respiratory effort, no intercostal retractions or use of accessory muscles; normal breath sounds bilaterally - no crackles and no wheezes.    Heart:  normal rate, regular rhythm, no murmur, and no rub. BLE without edema. normal DP pulses and normal cap refill in all 4 extremities    Abdomen:  soft, non-tender, normal bowel sounds, no distention; no masses and no appreciable hepatomegaly or splenomegaly.   Skin:  small 1.5 cm skin tear over lateral right leg apporx 3in above lateral mallelous - no signs of cellulitis or infx -  Psych:  Oriented X3, memory intact for recent and remote, normally interactive, good eye contact, not anxious appearing, not depressed appearing, and not agitated.      Impression & Recommendations:  Problem # 1:  ABDOMINAL PAIN (ICD-789.00) bilary colic - s/p ERCP and sphincterotomy dueing 09/2009 hosp - GI symptoms resolved 0 hosp d/c summary, procedures and labs reviewed  Problem # 2:  HIP FRACTURE, LEFT (ICD-820.8) s/p hemiarthroplast , progressing well - cont HHPT and ortho f/u as planned  Problem # 3:  LACERATION, FOOT (ICD-892.0) skin tear, no signs infx - cont HH with purcol collagen  dsg change as ongoing Orders: Home Health Referral (Home Health)  Problem # 4:  SPINAL STENOSIS (ICD-724.00)  advised against pain patch due to intol of narcs also advised against chiropractic manipualation unless approved by her neurosurg as could become worse cont tylenol and tramadol as needed  cont steroid shots and mgmt as per [facility]  Complete Medication List: 1)  Cardizem Cd 180 Mg Xr24h-cap (Diltiazem hcl coated beads) .... Take 1 po qd 2)  Zocor 40 Mg Tabs (Simvastatin) .... Take 1 at bedtime 3)  Coreg 25 Mg  Tabs (Carvedilol) .... Take 1/2 two times a day 4)  Aspirin 81 Mg Tbec (Aspirin) .... Take 1 by mouth qd 5)  Prilosec 20 Mg Cpdr (Omeprazole) .... Take 1 po qd 6)  Nitroglycerin 0.6 Mg/hr Pt24 (Nitroglycerin) .... Use prn 7)  Tramadol Hcl 50 Mg Tabs (Tramadol hcl) .... Take 1 three times a day as needed 8)  Fish Oil 1000 Mg Caps (Omega-3 fatty acids) .... Take 1 by mouth qd 9)  Caltrate 600+d 600-400 Mg-unit Tabs (Calcium carbonate-vitamin d) .... Once daily 10)  Cvs Spectravite Tabs (Multiple vitamins-minerals) .... Once daily 11)  Vitamin D 1000 Unit Tabs (Cholecalciferol) .... Once daily 12)  Vitamin C 500 Mg Tabs (Ascorbic acid) .... Once daily 13)  Venlafaxine Hcl 75 Mg Xr24h-tab (Venlafaxine hcl) .[name].. 1 by mouth once daily - start 09/10/09 14)  Align Caps (Probiotic product) .[name].. 1 by mouth once daily 15)  Miralax Powd (Polyethylene glycol 3350) .[name].. 17g (scoop) once daily with 8oz water 16)  Ambien 10 Mg Tabs (Zolpidem tartrate) .[name].. 1 by mouth at bedtime prn 17)  Hydrocodone-acetaminophen 5-325 Mg Tabs (Hydrocodone-acetaminophen) .... 1/2-1 tab by mouth every 4-6 hours as needed for severe pain 18)  Quinapril Hcl 5 Mg Tabs (Quinapril hcl) .... Take 1 by mouth once daily  Patient Instructions: 1)  it was good to see you today.  2)  hospitalization and medications reviewed today 3)  will have gentiva continue to do wound care for right leg sore as we discussed -  4)   for pain unrelieved by tramadol, ok to use 1/2-1 hydrocodone as discussed 5)  you do not have diabetes! we will recheck a blood test next office visit to prove same! your sugars temporarily in thehospital were high because of the stressors on your body, not because of diabetes - 6)  Please schedule a follow-up appointment in 3-6 months, sooner if problems.

## 2010-08-11 NOTE — Miscellaneous (Signed)
Summary: FL2 and paperwork/[facility]  FL2 and paperwork/[facility]   Imported By: Lester Altamont 01/19/2010 10:48:36  _____________________________________________________________________  External Attachment:    Type:   Image     Comment:   External Document

## 2010-08-11 NOTE — Assessment & Plan Note (Signed)
Summary: TB TEST/ VAL /NWS  Nurse Visit  CC: TB skin test./kb   Allergies: 1)  ! * Spornax  Immunizations Administered:  PPD Skin Test:    Vaccine Type: PPD    Site: left forearm    Mfr: Sanofi Pasteur    Dose: 0.1 ml    Route: ID    Given by: Lucious Groves    Exp. Date: 12/05/2011    Lot #: [id number]  PPD Results    Date of reading: 01/19/2010    Results: < 5mm    Interpretation: negative  Orders Added: 1)  TB Skin Test [86580] 2)  Admin 1st Vaccine [82956]

## 2010-08-11 NOTE — Progress Notes (Signed)
  Phone Note Call from Patient   Caller: Daughter/Jenni Summary of Call: recieved call from pt daughter stating mom bp drop. Was told to remain off diltiazem & stop coreg. when she took bp earlier this am bp was fine. PT just took BP it was 83/42. spoke with dr. Felicity Coyer- she wants pt to stop the qunapril also. make sure she is drinking plenty of fluids. keep monitoring bp and if bp is still running low mrs. Sandefer need to make appt to eval low bp. info was given to [place]. Initial call taken by: Orlan Leavens,  January 05, 2010 11:41 AM  Follow-up for Phone Call        reviewed above- i agree as noted Follow-up by: Newt Lukes MD,  January 05, 2010 12:05 PM    New/Updated Medications: COREG 25 MG TABS (CARVEDILOL) HOLD QUINAPRIL HCL 5 MG TABS (QUINAPRIL HCL) HOLD

## 2010-08-11 NOTE — Progress Notes (Signed)
Summary: elevated BP  Phone Note Call from Patient Call back at Home Phone [phone]   Caller: Patient Reason for Call: Talk to Nurse Summary of Call: Pt left on my vm requsting call back. Called pt back she states since last ov BP still been running high. Pt states check this am first it was 200 over something. Then recheck it was 170/88. ? if BP monitor is working right. Pt wanting to know if md would rx something or will she have to make appt. Pls advise Initial call taken by: Orlan Leavens RMA,  July 28, 2010 8:37 AM  Follow-up for Phone Call        take additional 1/2 or coreg - increase to 1/2 of 25mg  tab three times a day - will also ask HHRN to come out weekly for BP check - thanks Follow-up by: Newt Lukes MD,  July 28, 2010 12:19 PM  Additional Follow-up for Phone Call Additional follow up Details #1::        Notified pt with md recommendations Additional Follow-up by: Orlan Leavens RMA,  July 28, 2010 1:23 PM    New/Updated Medications: COREG 25 MG TABS (CARVEDILOL) 1/2 tab by mouth three times a day

## 2010-08-11 NOTE — Progress Notes (Signed)
Summary: tramadol  Phone Note Refill Request Message from:  Fax from Pharmacy on September 13, 2009 3:26 PM  Refills Requested: Medication #1:  TRAMADOL HCL 50 MG TABS take 1 three times a day as needed   Last Refilled: 08/02/2009  Method Requested: Electronic Initial call taken by: Orlan Leavens,  September 13, 2009 3:27 PM    Prescriptions: TRAMADOL HCL 50 MG TABS (TRAMADOL HCL) take 1 three times a day as needed  #90 x 1   Entered by:   Orlan Leavens   Authorized by:   Newt Lukes MD   Signed by:   Orlan Leavens on 09/13/2009   Method used:   Electronically to        [facility] [address] [phone]* (retail)       [id number]       [place], [place]  [address]       Ph: [phone] or [phone]       Fax: [phone]   RxID:   [id number]

## 2010-08-11 NOTE — Assessment & Plan Note (Signed)
Summary: abd cramps/leschber/cd   Vital Signs:  Patient profile:   77 year old female Height:      65 inches Weight:      120 pounds O2 Sat:      94 % on Room air Temp:     98.1 degrees F oral Pulse rate:   91 / minute Pulse rhythm:   regular Resp:     16 per minute BP sitting:   130 / 84  (left arm)  Vitals Entered By: Lucious Groves (September 02, 2009 1:33 PM)  O2 Flow:  Room air CC: C/O diarrhea x 1 week and Imodium has provided very little relief. Pt also has frequent abd cramping/pain./kb Is Patient Diabetic? No Pain Assessment Patient in pain? no        Primary Care Provider:  Newt Lukes MD  CC:  C/O diarrhea x 1 week and Imodium has provided very little relief. Pt also has frequent abd cramping/pain./kb.  History of Present Illness: New to me she complains of symptoms for about 10 days that started with URI complaints but then changed to diarrhea with cramping, nausea, and fatigue. She has taken a course of Zithromax.  She went to the ER 5 days ago and her chest xray was normal and labs showed mild hyperglycemia but were otherwise normal. She is currently having about 3 watery/ crampy stools per day. The abd. cramps resolve after she passes stool or gas. She has not had any vomiting, fever, or chills.  Preventive Screening-Counseling & Management  Alcohol-Tobacco     Alcohol drinks/day: <1     Alcohol Counseling: not indicated; use of alcohol is not excessive or problematic     Smoking Status: quit     Year Quit: 1980s  Caffeine-Diet-Exercise     Does Patient Exercise: no  Hep-HIV-STD-Contraception     Hepatitis Risk: no risk noted      Drug Use:  no.    Medications Prior to Update: 1)  Cardizem Cd 180 Mg Xr24h-Cap (Diltiazem Hcl Coated Beads) .... Take 1 Po Qd 2)  Quinapril Hcl 20 Mg Tabs (Quinapril Hcl) .... Take 1 By Mouth Qd 3)  Tekturna 150 Mg Tabs (Aliskiren Fumarate) .... Take 1 Po Qd 4)  Zocor 40 Mg Tabs (Simvastatin) .... Take 1 At  Bedtime 5)  Coreg 25 Mg Tabs (Carvedilol) .... Take 1/2 Two Times A Day 6)  Aspirin 81 Mg Tbec (Aspirin) .... Take 1 By Mouth Qd 7)  Prilosec 20 Mg Cpdr (Omeprazole) .... Take 1 Po Qd 8)  Nitroglycerin 0.6 Mg/hr Pt24 (Nitroglycerin) .... Use Prn 9)  Tramadol Hcl 50 Mg Tabs (Tramadol Hcl) .... Take 1 Three Times A Day As Needed 10)  Ra Arthritis Pain Relief 650 Mg Cr-Tabs (Acetaminophen) .... Take 6-8 By Mouth Once Daily 11)  Fish Oil 1000 Mg Caps (Omega-3 Fatty Acids) .... Take 1 By Mouth Qd 12)  Caltrate 600+d 600-400 Mg-Unit Tabs (Calcium Carbonate-Vitamin D) .... Once Daily 13)  Cvs Spectravite  Tabs (Multiple Vitamins-Minerals) .... Once Daily 14)  Vitamin D 1000 Unit Tabs (Cholecalciferol) .... Once Daily 15)  Vitamin C 500 Mg Tabs (Ascorbic Acid) .... Once Daily 16)  Venlafaxine Hcl 37.5 Mg Xr24h-Tab (Venlafaxine Hcl) .[name].. 1 By Mouth Once Daily X 2 Weeks, Then 2 By Mouth Once Daily 17)  Venlafaxine Hcl 75 Mg Xr24h-Tab (Venlafaxine Hcl) .[name].. 1 By Mouth Once Daily - Start 09/10/09 18)  Align  Caps (Probiotic Product) .[name].. 1 By Mouth Once Daily 19)  Miralax  Powd (Polyethylene Glycol 3350) .[name].. 17g (Scoop) Once Daily With [employer] 20)  Magic Mouthwash .... 5 Cc By Mouth Three Times A Day (Swish and Swallow) X 5 Days - Then As Needed  Current Medications (verified): 1)  Cardizem Cd 180 Mg Xr24h-Cap (Diltiazem Hcl Coated Beads) .... Take 1 Po Qd 2)  Quinapril Hcl 20 Mg Tabs (Quinapril Hcl) .... Take 1 By Mouth Qd 3)  Tekturna 150 Mg Tabs (Aliskiren Fumarate) .... Take 1 Po Qd 4)  Zocor 40 Mg Tabs (Simvastatin) .... Take 1 At Bedtime 5)  Coreg 25 Mg Tabs (Carvedilol) .... Take 1/2 Two Times A Day 6)  Aspirin 81 Mg Tbec (Aspirin) .... Take 1 By Mouth Qd 7)  Prilosec 20 Mg Cpdr (Omeprazole) .... Take 1 Po Qd 8)  Nitroglycerin 0.6 Mg/hr Pt24 (Nitroglycerin) .... Use Prn 9)  Tramadol Hcl 50 Mg Tabs (Tramadol Hcl) .... Take 1 Three Times A Day As Needed 10)  Ra Arthritis Pain Relief 650 Mg  Cr-Tabs (Acetaminophen) .... Take 6-8 By Mouth Once Daily 11)  Fish Oil 1000 Mg Caps (Omega-3 Fatty Acids) .... Take 1 By Mouth Qd 12)  Caltrate 600+d 600-400 Mg-Unit Tabs (Calcium Carbonate-Vitamin D) .... Once Daily 13)  Cvs Spectravite  Tabs (Multiple Vitamins-Minerals) .... Once Daily 14)  Vitamin D 1000 Unit Tabs (Cholecalciferol) .... Once Daily 15)  Vitamin C 500 Mg Tabs (Ascorbic Acid) .... Once Daily 16)  Venlafaxine Hcl 37.5 Mg Xr24h-Tab (Venlafaxine Hcl) .[name].. 1 By Mouth Once Daily X 2 Weeks, Then 2 By Mouth Once Daily 17)  Venlafaxine Hcl 75 Mg Xr24h-Tab (Venlafaxine Hcl) .[name].. 1 By Mouth Once Daily - Start 09/10/09 18)  Align  Caps (Probiotic Product) .[name].. 1 By Mouth Once Daily 19)  Miralax  Powd (Polyethylene Glycol 3350) .[name].. 17g (Scoop) Once Daily With [employer] 20)  Magic Mouthwash .... 5 Cc By Mouth Three Times A Day (Swish and Swallow) X 5 Days - Then As Needed 21)  Ambien 10 Mg Tabs (Zolpidem Tartrate) .[name].. 1 By Mouth At Bedtime Prn  Allergies (verified): 1)  ! * Spornax  Past History:  Past Medical History: Reviewed history from 08/13/2009 and no changes required. Coronary artery disease Hyperlipidemia Hypertension Allergic rhinitis depression GERD spinal stenosis macular degeneration osteop.[name]. (stopped fosamax indep, await bone density report)  MD rooster: cards - [facility] - [facility] neurosurg - [facility] - [facility] ([facility]) ortho - [facility]  Past Surgical History: Reviewed history from 07/15/2009 and no changes required. Coronary artery bypass graft, 2004 Appendectomy (1940)  Family History: Reviewed history from 07/15/2009 and no changes required. Family History of Arthritis (parent, grandparents) Family History Breast cancer 1st degree relative <50 (parent) Family History Hypertension (parent) Heart disease (parent)  Social History: Reviewed history from 07/15/2009 and no changes required. 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  The patient denies anorexia, fever, chest pain, syncope, peripheral edema, prolonged cough, headaches, hemoptysis, melena, hematochezia, severe indigestion/heartburn, suspicious skin lesions, and angioedema.   GI:  Complains of change in bowel habits, diarrhea, and nausea; denies bloody stools, constipation, dark tarry stools, indigestion, loss of appetite, vomiting, and yellowish skin color.  Physical Exam  General:  alert, well-developed, well-nourished, well-hydrated, appropriate  dress, normal appearance, healthy-appearing, cooperative to examination, and good hygiene.   Head:  normocephalic, atraumatic, no abnormalities observed, and no abnormalities palpated.   Eyes:  pink moist mm., no icterus. Mouth:  good dentition, pharynx pink and moist, and edentulous.   Neck:  supple, full ROM, no masses, no thyromegaly, no thyroid nodules or tenderness, no JVD, no carotid bruits, no cervical lymphadenopathy, and no neck tenderness.   Lungs:  normal respiratory effort, no intercostal retractions, no accessory muscle use, normal breath sounds, no dullness, no fremitus, no crackles, and no wheezes.   Heart:  normal rate, regular rhythm, no murmur, no gallop, no rub, and no JVD.   Abdomen:  soft, non-tender, no distention, no masses, no guarding, no rigidity, no rebound tenderness, no abdominal hernia, no inguinal hernia, no hepatomegaly, no splenomegaly, and bowel sounds hyperactive.   Msk:  No deformity or scoliosis noted of thoracic or lumbar spine.   Pulses:  R and L carotid,radial,femoral,dorsalis pedis and posterior tibial pulses are full and equal bilaterally Extremities:  No clubbing, cyanosis, edema, or deformity noted with normal full range  of motion of all joints.   Neurologic:  alert & oriented X3 and cranial nerves II-XII symetrically intact.  strength normal in all extremities, sensation intact to light touch, and gait normal. speech fluent without dysarthria or aphasia; follows commands with good comprehension.  Skin:  turgor normal, color normal, no rashes, no suspicious lesions, no ecchymoses, no petechiae, no purpura, no ulcerations, and no edema.   Cervical Nodes:  no anterior cervical adenopathy and no posterior cervical adenopathy.   Axillary Nodes:  no R axillary adenopathy and no L axillary adenopathy.   Inguinal Nodes:  no R inguinal adenopathy and no L inguinal adenopathy.   Psych:  normally interactive, good eye contact, not anxious appearing, not depressed appearing, easily distracted, poor concentration, memory impairment, and judgment fair.     Impression & Recommendations:  Problem # 1:  DIARRHEA OF PRESUMED INFECTIOUS ORIGIN (ICD-009.3) Assessment New  this sounds viral so I coached her to treat supportively, will look for infectious causes and since she has recently had anitbiotic therapy will look for C. diff infection. Her updated medication list for this problem includes:    Align Caps (Probiotic product) .[name]... 1 by mouth once daily  Orders: T-Stool Giardia / Crypto- EIA (82956) T-Stool for O&P (21308-65784) T-Culture, C-Diff Toxin A/B ([id number])  Discussed symptom control and diet. Call if worsening of symptoms or signs of dehydration.   Problem # 2:  BRONCHITIS, VIRAL (ICD-466.0) Assessment: Improved  Take antibiotics and other medications as directed. Encouraged to push clear liquids, get enough rest, and take acetaminophen as needed. To be seen in 5-7 days if no improvement, sooner if worse.  Problem # 3:  HYPERTENSION (ICD-401.9) Assessment: Unchanged  Her updated medication list for this problem includes:    Cardizem Cd 180 Mg Xr24h-cap (Diltiazem hcl coated beads) .[name]... Take 1 po qd     Quinapril Hcl 20 Mg Tabs (Quinapril hcl) .[name]... Take 1 by mouth qd    Tekturna 150 Mg Tabs (Aliskiren fumarate) .[name]... Take 1 po qd    Coreg 25 Mg Tabs (Carvedilol) .[name]... Take 1/2 two times a day  BP today: 130/84 Prior BP: 124/72 (08/25/2009)  Labs Reviewed: K+: 3.7 (03/30/2008) Creat: : 0.62 (03/30/2008)     Complete Medication List: 1)  Cardizem Cd 180 Mg Xr24h-cap (Diltiazem hcl coated beads) .... Take 1 po qd 2)  Quinapril Hcl 20 Mg Tabs (Quinapril  hcl) .... Take 1 by mouth qd 3)  Tekturna 150 Mg Tabs (Aliskiren fumarate) .... Take 1 po qd 4)  Zocor 40 Mg Tabs (Simvastatin) .... Take 1 at bedtime 5)  Coreg 25 Mg Tabs (Carvedilol) .... Take 1/2 two times a day 6)  Aspirin 81 Mg Tbec (Aspirin) .... Take 1 by mouth qd 7)  Prilosec 20 Mg Cpdr (Omeprazole) .... Take 1 po qd 8)  Nitroglycerin 0.6 Mg/hr Pt24 (Nitroglycerin) .... Use prn 9)  Tramadol Hcl 50 Mg Tabs (Tramadol hcl) .... Take 1 three times a day as needed 10)  Ra Arthritis Pain Relief 650 Mg Cr-tabs (Acetaminophen) .... Take 6-8 by mouth once daily 11)  Fish Oil 1000 Mg Caps (Omega-3 fatty acids) .... Take 1 by mouth qd 12)  Caltrate 600+d 600-400 Mg-unit Tabs (Calcium carbonate-vitamin d) .... Once daily 13)  Cvs Spectravite Tabs (Multiple vitamins-minerals) .... Once daily 14)  Vitamin D 1000 Unit Tabs (Cholecalciferol) .... Once daily 15)  Vitamin C 500 Mg Tabs (Ascorbic acid) .... Once daily 16)  Venlafaxine Hcl 37.5 Mg Xr24h-tab (Venlafaxine hcl) .[name].. 1 by mouth once daily x 2 weeks, then 2 by mouth once daily 17)  Venlafaxine Hcl 75 Mg Xr24h-tab (Venlafaxine hcl) .[name].. 1 by mouth once daily - start 09/10/09 18)  Align Caps (Probiotic product) .[name].. 1 by mouth once daily 19)  Miralax Powd (Polyethylene glycol 3350) .[name].. 17g (scoop) once daily with 8oz water 20)  Magic Mouthwash  .... 5 cc by mouth three times a day (swish and swallow) x 5 days - then as needed 21)  Ambien 10 Mg Tabs (Zolpidem tartrate) .[name].. 1 by mouth at  bedtime prn  Patient Instructions: 1)  Please schedule a follow-up appointment in 2 weeks. 2)  teh main problem with gastroenteritis is dehydration. Drink plenty of fluids and take solids as you feel better. If you are unable to keep anything down and/or you show signs of dehydration(dry/cracked lips, lack of tears, not urinating, very sleepy), call our office.

## 2010-08-11 NOTE — Progress Notes (Signed)
Summary: Julia Black  Phone Note Refill Request Message from:  Fax from Pharmacy on May 16, 2010 11:29 AM  Refills Requested: Medication #1:  AMBIEN 10 MG TABS 1 by mouth at bedtime prn   Last Refilled: 04/19/2010 [facility] [phone] Last ov 03/18/10 Is this ok to refill?   Method Requested: Electronic Next Appointment Scheduled: 06/24/10 Initial call taken by: Orlan Leavens RMA,  May 16, 2010 11:29 AM  Follow-up for Phone Call        ok to fill as prev rx'd - thanks Follow-up by: Newt Lukes MD,  May 16, 2010 12:13 PM  Additional Follow-up for Phone Call Additional follow up Details #1::        Paper request faxed to pharmacy. Updated EMR Additional Follow-up by: Orlan Leavens RMA,  May 16, 2010 2:01 PM    Prescriptions: AMBIEN 10 MG TABS (ZOLPIDEM TARTRATE) 1 by mouth at bedtime prn  #30 x 1   Entered by:   Orlan Leavens RMA   Authorized by:   Newt Lukes MD   Signed by:   Orlan Leavens RMA on 05/16/2010   Method used:   Historical   RxID:   [id number]

## 2010-08-11 NOTE — Progress Notes (Signed)
Summary: tramadol  Phone Note Refill Request Message from:  Fax from Pharmacy on July 29, 2010 4:33 PM  Refills Requested: Medication #1:  TRAMADOL HCL 50 MG TABS take 1 three times a day as needed   Last Refilled: 06/13/2010 Leonie Douglas   Method Requested: Electronic Initial call taken by: Orlan Leavens RMA,  July 29, 2010 4:34 PM    Prescriptions: TRAMADOL HCL 50 MG TABS (TRAMADOL HCL) take 1 three times a day as needed  #90 Tablet x 0   Entered by:   Orlan Leavens RMA   Authorized by:   Newt Lukes MD   Signed by:   Orlan Leavens RMA on 07/29/2010   Method used:   Electronically to        [facility] (retail)       [address]       [place], [place]  [id number]       Ph: [phone] or [phone]       Fax: [phone]   RxID:   [id number]

## 2010-08-11 NOTE — Miscellaneous (Signed)
Summary: [place] 2 form/[facility]  [place] [facility] form/[facility]   Imported By: Sherian Rein 01/14/2010 10:47:14  _____________________________________________________________________  External Attachment:    Type:   Image     Comment:   External Document

## 2010-08-11 NOTE — Progress Notes (Signed)
Summary: quinapril  Phone Note Refill Request Message from:  Fax from Pharmacy on March 09, 2010 12:55 PM  Refills Requested: Medication #1:  QUINAPRIL HCL 5 MG TABS 1 by mouth once daily  Method Requested: Electronic Initial call taken by: Orlan Leavens RMA,  March 09, 2010 12:55 PM    Prescriptions: QUINAPRIL HCL 5 MG TABS (QUINAPRIL HCL) 1 by mouth once daily  #30 x 3   Entered by:   Orlan Leavens RMA   Authorized by:   Newt Lukes MD   Signed by:   Orlan Leavens RMA on 03/09/2010   Method used:   Electronically to        [facility] (retail)       [address]       [place], [place]  [address]       Ph: [phone] or [phone]       Fax: [phone]   RxID:   [id number]   Appended Document: quinapril & venlafaxine    Clinical Lists Changes  Medications: Rx of VENLAFAXINE HCL 75 MG XR24H-TAB (VENLAFAXINE HCL) 1 by mouth once daily;  #30 x 3;  Signed;  Entered by: Orlan Leavens RMA;  Authorized by: Newt Lukes MD;  Method used: Electronically to [facility] [address] [place], [place]  [address], Ph: [phone] or [phone], Fax: [phone]    Prescriptions: VENLAFAXINE HCL 75 MG XR24H-TAB (VENLAFAXINE HCL) 1 by mouth once daily  #30 x 3   Entered by:   Orlan Leavens RMA   Authorized by:   Newt Lukes MD   Signed by:   Orlan Leavens RMA on 03/09/2010   Method used:   Electronically to        [facility] (retail)       [address]       [place], [place]  [address]       Ph: [phone] or [phone]       Fax: [phone]   RxID:   [id number]

## 2010-08-11 NOTE — Progress Notes (Signed)
Summary: referral  Phone Note Call from Patient   Caller: Daughter/ [name] Summary of Call: forgot to bring form in from [facility], Requesting order to be fax to Att: Debbie for [facility] medical management, and continuation with HH PT/OPT. also for aide. Can fax order to [phone]. Debbie cell # [phone] Initial call taken by: Orlan Leavens,  January 03, 2010 4:22 PM  Follow-up for Phone Call        done - thanks Follow-up by: Newt Lukes MD,  January 03, 2010 4:39 PM  New Problems: SHINGLES (ICD-053.9) FRACTURE, PELVIS, RIGHT (ICD-808.8)   New Problems: SHINGLES (ICD-053.9) FRACTURE, PELVIS, RIGHT (ICD-808.8)

## 2010-08-11 NOTE — Assessment & Plan Note (Signed)
Summary: POST HOSP/NWS  #   Vital Signs:  Patient profile:   77 year old female Height:      64 inches (162.56 cm) Weight:      121.8 pounds (55.36 kg) O2 Sat:      96 % on Room air Temp:     97.6 degrees F (36.44 degrees C) oral Pulse rate:   55 / minute BP sitting:   100 / 52  (left arm) Cuff size:   regular  Vitals Entered By: Orlan Leavens RMA (March 18, 2010 11:37 AM)  O2 Flow:  Room air CC: Hosp follow-up Is Patient Diabetic? No Pain Assessment Patient in pain? no        Primary Care Provider:  Newt Lukes MD  CC:  Hosp follow-up.  History of Present Illness: here for f/u -  hosp [facility] 8/28-8/30/11 - acute d CHF - s/p diuresis and cards eval - no aggressive cardiac eval pirsued - symptoms improved (less SOB and periph edema) with diuresis - doing well since dc home - no recurrent edema or SOB, no CP - 100% compliant with meds   chronic pain - left hip (s/p fx and surg) and back (spinal stenosis) takes 2 vicodin daily - advised by ortho to get rx for same from PCP ambulatory with cane - no falls, no constipation or confusion  depression - much improved compared to prior visits - no sadness or fatigue - tol med well - ?re: short acting or XR?  HTN - reports compliance with ongoing medical treatment and no changes in medication dose or frequency. denies adverse side effects related to current therapy. no HA or weakness  dyslipidemia - reports compliance with ongoing medical treatment and no changes in medication dose or frequency. denies adverse side effects related to current therapy. no myalgias  Clinical Review Panels:  Immunizations   Last Tetanus Booster:  Historical (07/11/2007)   Last Flu Vaccine:  Fluvax 3+ (03/18/2010)   Last Pneumovax:  Historical (07/11/2007)   Last PPD Date Read:  01/19/2010 (01/17/2010)   Last PPD Result:  negative (01/17/2010)  CBC   WBC:  6.9 (03/06/2010)   RBC:  4.05 (03/06/2010)   Hgb:  13.6 (03/06/2010)   Hct:   40.0 (03/06/2010)   Platelets:  233 (03/06/2010)   MCV  91.9 (03/06/2010)   MCHC  33.1 (09/27/2009)   RDW  14.1 (03/06/2010)   PMN:  68 (03/06/2010)   Lymphs:  13.0 (09/27/2009)   Monos:  9 (03/06/2010)   Eosinophils:  3 (03/06/2010)   Basophil:  1 (03/06/2010)  Complete Metabolic Panel   Glucose:  107 (03/08/2010)   Sodium:  139 (03/08/2010)   Potassium:  3.5 (03/08/2010)   Chloride:  99 (03/08/2010)   CO2:  31 (03/08/2010)   BUN:  24 (03/08/2010)   Creatinine:  0.73 (03/08/2010)   Albumin:  1.8 (10/08/2009)   Total Protein:  4.6 (10/08/2009)   Calcium:  9.0 (03/08/2010)   Total Bili:  0.6 (10/08/2009)   Alk Phos:  120 (10/08/2009)   SGPT (ALT):  23 (10/08/2009)   SGOT (AST):  20 (10/08/2009)   Current Medications (verified): 1)  Zocor 40 Mg Tabs (Simvastatin) .... Take 1 At Bedtime 2)  Coreg 25 Mg Tabs (Carvedilol) .... 1/2 Tab By Mouth Two Times A Day 3)  Aspirin 81 Mg Tbec (Aspirin) .... Take 1 By Mouth Once Daily 4)  Prilosec 20 Mg Cpdr (Omeprazole) .... Take 1 By Mouth Once Daily 5)  Nitroglycerin 0.6  Mg/hr Pt24 (Nitroglycerin) .... Use Prn 6)  Tramadol Hcl 50 Mg Tabs (Tramadol Hcl) .... Take 1 Three Times A Day As Needed 7)  Fish Oil 1000 Mg Caps (Omega-3 Fatty Acids) .... Take 1 By Mouth Once Daily 8)  Caltrate 600+d 600-400 Mg-Unit Tabs (Calcium Carbonate-Vitamin D) .... Once Daily 9)  Cvs Spectravite  Tabs (Multiple Vitamins-Minerals) .... Once Daily 10)  Vitamin D 1000 Unit Tabs (Cholecalciferol) .... Once Daily 11)  Vitamin C 500 Mg Tabs (Ascorbic Acid) .... Once Daily 12)  Venlafaxine Hcl 75 Mg Tabs (Venlafaxine Hcl) .[name].. 1 By Mouth Once Daily 13)  Align  Caps (Probiotic Product) .[name].. 1 By Mouth Once Daily 14)  Miralax  Powd (Polyethylene Glycol 3350) .[name].. 17g (Scoop) Once Daily With 8oz Water 15)  Ambien 10 Mg Tabs (Zolpidem Tartrate) .[name].. 1 By Mouth At Bedtime Prn 16)  Quinapril Hcl 5 Mg Tabs (Quinapril Hcl) .[name].. 1 By Mouth Once Daily 17)  Tessalon 200 Mg  Caps (Benzonatate) .[name].. 1 By Mouth Three Times A Day For Cough 18)  Mucinex Dm Maximum Strength 60-1200 Mg Xr12h-Tab (Dextromethorphan-Guaifenesin) .[name].. 1 By Mouth Two Times A Day 19)  Furosemide 20 Mg Tabs (Furosemide) .[name].. 1 By Mouth Once Daily X 7 Days, Then As Needed 20)  Hydrocodone-Acetaminophen 5-325 Mg Tabs (Hydrocodone-Acetaminophen) .... Take 1 Q 6 Hours As Needed  Allergies (verified): 1)  ! * Spornax  Past History:  Past Medical History: Coronary artery disease Hyperlipidemia Hypertension Allergic rhinitis depression GERD  spinal stenosis w/ chronic back pain macular degeneration osteop.[name]. 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    Physical Exam  General:  alert, well-developed, well-nourished, and cooperative to examination.  son at side Lungs:  normal respiratory effort, no intercostal retractions or use of accessory muscles; normal breath sounds bilaterally - no crackles and no wheezes.  Heart:  normal rate, regular rhythm, no murmur, and no rub. BLE with trace edema at  L>R feet/ankles. Psych:  Oriented X3, memory intact for recent and remote, normally interactive, good eye contact, not anxious appearing, not depressed appearing, and not agitated.      Impression & Recommendations:  Problem # 1:  CONGESTIVE HEART FAILURE  (ICD-428.0)  acute on chronic diastolic dysfx - s/p hosp diuresis 02/2010 - course/tests/labs/meds reviewed compensated at present - cont same CAD hx s/p CABG 2004 - follow with cards as ongoing and cont med mgmt Her updated medication list for this problem includes:    Coreg 25 Mg Tabs (Carvedilol) .[name]... 1/2 tab by mouth two times a day    Aspirin 81 Mg Tbec (Aspirin) .[name]... Take 1 by mouth once daily    Quinapril Hcl 5 Mg Tabs (Quinapril hcl) .[name]... 1 by mouth once daily    Furosemide 40 Mg Tabs (Furosemide) .[name]... 1 by mouth once daily as needed for edema  Echocardiogram: showed ejection fraction of 55 % to 60% with no  wall motion abnormalties (03/07/2010)  Problem # 2:  DEPRESSION (ICD-311) symptoms much improved - cont same Her updated medication list for this problem includes:    Venlafaxine Hcl 75 Mg Xr24h-cap (Venlafaxine hcl) .[name]... 1 by mouth once daily  prev on lexapro - changed to generic effexor spring 2011 no need inc tx but will cont to monitor and address further after stablization of acute pain issues and recent resp illness  Problem # 3:  SPINAL STENOSIS (ICD-724.00)  prev advised against pain patch due to  poor tol of narcs - but on two times a day vicodin since hip hx 09/2009 - cont same - rx done also prev advised against chiropractic manipualation unless approved by her neurosurg as could become worse cont vicodin and tramadol as needed  cont steroid shots and mgmt as per [facility]  Problem # 4:  CORONARY ARTERY DISEASE (ICD-414.00)  Her updated medication list for this problem includes:    Coreg 25 Mg Tabs (Carvedilol) .[name]... 1/2 tab by mouth two times a day    Aspirin 81 Mg Tbec (Aspirin) .[name]... Take 1 by mouth once daily    Nitroglycerin 0.6 Mg/hr Pt24 (Nitroglycerin) ..... Use prn    Quinapril Hcl 5 Mg Tabs (Quinapril hcl) .[name]... 1 by mouth once daily    Furosemide 40 Mg Tabs (Furosemide) .[name]... 1 by mouth once daily as needed for edema  s/pCABG 2004- follows w/  [facility] cards  Problem # 5:  HYPERLIPIDEMIA (ICD-272.4)  Her updated medication list for this problem includes:    Zocor 40 Mg Tabs (Simvastatin) .[name]... Take 1 at bedtime  Labs Reviewed: SGOT: 20 (10/08/2009)   SGPT: 23 (10/08/2009)  Problem # 6:  HYPERTENSION (ICD-401.9)  Her updated medication list for this problem includes:    Coreg 25 Mg Tabs (Carvedilol) .[name]... 1/2 tab by mouth two times a day    Quinapril Hcl 5 Mg Tabs (Quinapril hcl) .[name]... 1 by mouth once daily    Furosemide 40 Mg Tabs (Furosemide) .[name]... 1 by mouth once daily as needed for edema  BP today: 100/52 Prior BP: 142/72 (01/17/2010)  Labs Reviewed: K+: 3.5 (03/08/2010) Creat: : 0.73 (03/08/2010)     Complete Medication List: 1)  Zocor 40 Mg Tabs (Simvastatin) .... Take 1 at bedtime 2)  Coreg 25 Mg Tabs (Carvedilol) .... 1/2 tab by mouth two times a day 3)  Aspirin 81 Mg Tbec (Aspirin) .... Take 1 by mouth once daily 4)  Prilosec 20 Mg Cpdr (Omeprazole) .... Take 1 by mouth once daily 5)  Nitroglycerin 0.6 Mg/hr Pt24 (Nitroglycerin) .... Use prn 6)  Tramadol Hcl 50 Mg Tabs (Tramadol hcl) .... Take 1 three times a day as needed 7)  Fish Oil 1000 Mg Caps (Omega-3 fatty acids) .... Take 1 by mouth once daily 8)  Caltrate 600+d 600-400 Mg-unit Tabs (Calcium carbonate-vitamin d) .... Once daily 9)  Cvs Spectravite Tabs (Multiple vitamins-minerals) .... Once daily 10)  Vitamin D 1000 Unit Tabs (Cholecalciferol) .... Once daily 11)  Vitamin C 500 Mg Tabs (Ascorbic acid) .... Once daily 12)  Venlafaxine Hcl 75 Mg Xr24h-cap (Venlafaxine hcl) .[name].. 1 by mouth once daily 13)  Align Caps (Probiotic product) .[name].. 1 by mouth once daily 14)  Miralax Powd (Polyethylene glycol 3350) .[name].. 17g (scoop) once daily with 8oz water 15)  Ambien 10 Mg Tabs (Zolpidem tartrate) .[name].. 1 by mouth at bedtime prn 16)  Quinapril Hcl 5 Mg Tabs (Quinapril hcl) .[name].. 1 by mouth once daily 17)  Tessalon  200 Mg Caps (Benzonatate) .[name].. 1 by mouth three  times a day for cough 18)  Mucinex Dm Maximum Strength 60-1200 Mg Xr12h-tab (Dextromethorphan-guaifenesin) .[name].. 1 by mouth two times a day 19)  Furosemide 40 Mg Tabs (Furosemide) .[name].. 1 by mouth once daily as needed for edema 20)  Hydrocodone-acetaminophen 5-325 Mg Tabs (Hydrocodone-acetaminophen) .... Take 1 q 6 hours as needed 21)  Klor-con M20 20 Meq Cr-tabs (Potassium chloride crys cr) .[name].. 1 by mouth once daily with furosemide use as needed  Other Orders: Flu Vaccine 52yrs + MEDICARE PATIENTS (J4782) Administration Flu vaccine - MCR (N5621) Flu Vaccine Consent Questions     Do you have a history of severe allergic reactions to this vaccine? no    Any prior history of allergic reactions to egg and/or gelatin? no    Do you have a sensitivity to the preservative Thimersol? no    Do you have a past history of Guillan-Barre Syndrome? no    Do you currently have an acute febrile illness? no    Have you ever had a severe reaction to latex? no    Vaccine information given and explained to patient? yes    Are you currently pregnant? no    Lot Number:[id number]   Exp Date:01/07/2011   Manufacturer: [employer]    Site Given  Left Deltoid IMation Flu vaccine - MCR (H0865)  Patient Instructions: 1)  it was good to see you today. 2)  medications and recent hospitalization reviewed -  3)  we will manage your pain medications as discussed - refills provided 4)  call if increased swelling or shortness of breath not repsonding to your diuretics -  5)  resume extended release form of venlafaxine for depression -  6)  Please schedule a follow-up appointment in 3 months to monitor weight, medications and other medical issues, sooner if problems.  Prescriptions: HYDROCODONE-ACETAMINOPHEN 5-325 MG TABS (HYDROCODONE-ACETAMINOPHEN) take 1 q 6 hours as needed  #60 x 6   Entered and Authorized by:   Newt Lukes MD   Signed by:   Newt Lukes MD on 03/18/2010   Method used:   Print then Give to  Patient   RxID:   [id number] VENLAFAXINE HCL 75 MG XR24H-CAP (VENLAFAXINE HCL) 1 by mouth once daily  #30 x 6   Entered and Authorized by:   Newt Lukes MD   Signed by:   Newt Lukes MD on 03/18/2010   Method used:   Electronically to        [facility] (retail)       [address]       [place], [place]  [address]       Ph: [phone] or [phone]       Fax: [phone]   RxID:   [id number]    .medflu

## 2010-08-11 NOTE — Miscellaneous (Signed)
Summary: PT orders/[facility]   PT orders/[facility]   Imported By: Sherian Rein 04/12/2010 07:34:05  _____________________________________________________________________  External Attachment:    Type:   Image     Comment:   External Document

## 2010-08-11 NOTE — Progress Notes (Signed)
Summary: Remus Loffler  Phone Note Refill Request Message from:  Fax from Pharmacy on July 07, 2010 8:45 AM  Refills Requested: Medication #1:  AMBIEN 10 MG TABS 1 by mouth at bedtime prn # 30   Last Refilled: 06/07/2010 Sheliah Plane [phone] Last ov 06/27/10 Is this ok to refill?   Method Requested: Fax to Local Pharmacy Next Appointment Scheduled: 07/21/10 Initial call taken by: Orlan Leavens RMA,  July 07, 2010 8:45 AM  Follow-up for Phone Call        ok to fill as requested Follow-up by: Newt Lukes MD,  July 07, 2010 10:34 AM  Additional Follow-up for Phone Call Additional follow up Details #1::        Faxed [facility] request back to [facility]. Updated EMR Additional Follow-up by: Orlan Leavens RMA,  July 07, 2010 11:07 AM    Prescriptions: AMBIEN 10 MG TABS (ZOLPIDEM TARTRATE) 1 by mouth at bedtime prn  #30 x 1   Entered by:   Orlan Leavens RMA   Authorized by:   Newt Lukes MD   Signed by:   Orlan Leavens RMA on 07/07/2010   Method used:   Historical   RxID:   [id number]

## 2010-08-11 NOTE — Letter (Signed)
Summary: [facility]   Imported By: Lester [place] 08/06/2009 14:45:15  _____________________________________________________________________  External Attachment:    Type:   Image     Comment:   External Document

## 2010-08-11 NOTE — Progress Notes (Signed)
Summary: FYI  Phone Note Call from Patient   Caller: Daughter/ Jossie Ng, [phone] or [phone] Reason for Call: Talk to Nurse Summary of Call: Daughter left msg on VM req call back. Called daughter back had to leave msg on her VM returning call back concerning her mom Initial call taken by: Orlan Leavens,  October 13, 2009 11:34 AM  Follow-up for Phone Call        Called daughter back she stated mom was d/c from hosp last friday 10/08/09. Mom is now taking rehab and the family has suggested mom to stay @ a independent living place. Req help from md relating info to mom. Daughter will call back to schedule a hosp f/u. Did tell Pt at that visit family could discuss that with md and patient at that time Follow-up by: Orlan Leavens,  October 13, 2009 12:52 PM  Additional Follow-up for Phone Call Additional follow up Details #1::         ok. agree & will anticipate ov. thx Additional Follow-up by: Newt Lukes MD,  October 13, 2009 5:54 PM

## 2010-08-11 NOTE — Miscellaneous (Signed)
Summary: [facility]   Imported By: Lester [place] 02/14/2010 09:56:23  _____________________________________________________________________  External Attachment:    Type:   Image     Comment:   External Document

## 2010-08-11 NOTE — Assessment & Plan Note (Signed)
Summary: DEPRESSION/NWS   Vital Signs:  Patient profile:   77 year old female Height:      65 inches (165.10 cm) Weight:      123.4 pounds (56.09 kg) O2 Sat:      96 % on Room air Temp:     98.4 degrees F (36.89 degrees C) oral Pulse rate:   68 / minute BP sitting:   132 / 70  (left arm) Cuff size:   regular  Vitals Entered By: Orlan Leavens (August 13, 2009 1:42 PM)  O2 Flow:  Room air CC: depression Is Patient Diabetic? No Pain Assessment Patient in pain? no        Primary Care Provider:  Newt Lukes MD  CC:  depression.  History of Present Illness: here today with complaint of worsening depression. onset of symptoms was few months ago. course has been gradual onset and now occurs in worsening daily pattern. problem precipitated by eye problems, constipation and pain (back, knee) symptom characterized as "moping around all the time" problem associated with fatigue and decreased appetitie but not associated with crying spells or SI. symptoms improved by nothing - has been on Lexapro for years without recent dose change - reports daily compliance with this med tx symptoms worsened with inactivity and being alone. + prior hx of same symptoms.   also ?if ok for PT to help her back and knee pain  Current Medications (verified): 1)  Cardizem Cd 180 Mg Xr24h-Cap (Diltiazem Hcl Coated Beads) .... Take 1 Po Qd 2)  Quinapril Hcl 20 Mg Tabs (Quinapril Hcl) .... Take 1 By Mouth Qd 3)  Tekturna 150 Mg Tabs (Aliskiren Fumarate) .... Take 1 Po Qd 4)  Zocor 40 Mg Tabs (Simvastatin) .... Take 1 At Bedtime 5)  Coreg 25 Mg Tabs (Carvedilol) .... Take 1/2 Two Times A Day 6)  Lexapro 10 Mg Tabs (Escitalopram Oxalate) .... Take 1 By Mouth Qd 7)  Aspirin 81 Mg Tbec (Aspirin) .... Take 1 By Mouth Qd 8)  Prilosec 20 Mg Cpdr (Omeprazole) .... Take 1 Po Qd 9)  Nitroglycerin 0.6 Mg/hr Pt24 (Nitroglycerin) .... Use Prn 10)  Tramadol Hcl 50 Mg Tabs (Tramadol Hcl) .... Take 1 Three  Times A Day As Needed 11)  Ra Arthritis Pain Relief 650 Mg Cr-Tabs (Acetaminophen) .... Take 6-8 By Mouth Once Daily 12)  Fish Oil 1000 Mg Caps (Omega-3 Fatty Acids) .... Take 1 By Mouth Qd 13)  Caltrate 600+d 600-400 Mg-Unit Tabs (Calcium Carbonate-Vitamin D) .... Once Daily 14)  [facility] Spectravite  Tabs (Multiple Vitamins-Minerals) .... Once Daily 15)  Vitamin D 1000 Unit Tabs (Cholecalciferol) .... Once Daily 16)  Vitamin C 500 Mg Tabs (Ascorbic Acid) .... Once Daily  Allergies (verified): 1)  ! * Spornax  Past History:  Past Medical History: Coronary artery disease Hyperlipidemia Hypertension Allergic rhinitis depression GERD spinal stenosis macular degeneration osteop.[name]. (stopped fosamax indep, await bone density report)  MD rooster: cards - [facility] - [facility] - [facility] - [facility] - [facility]  Review of Systems       The patient complains of depression.  The patient denies anorexia, fever, weight loss, headaches, and abdominal pain.    Physical Exam  General:  alert, well-developed, well-nourished, and cooperative to examination.   dtr at side Lungs:  normal respiratory effort, no intercostal retractions or use of accessory muscles; normal breath sounds bilaterally - no crackles and no wheezes.    Heart:  normal rate, regular rhythm, no  murmur, and no rub. BLE without edema. Abdomen:  soft, non-tender, normal bowel sounds, no distention; no masses and no appreciable hepatomegaly or splenomegaly.   Psych:  Oriented X3, memory intact for recent and remote, normally interactive, good eye contact, not anxious appearing, min depressed appearing, and not agitated.      Impression & Recommendations:  Problem # 1:  DEPRESSION (ICD-311)  change meds as discussed though much of mood sadness is situational related to other med problems -  cont tx of all these as discussed/ongoing consider refer to behav health - no SI Her updated medication list  for this problem includes:    Lexapro 10 Mg Tabs (Escitalopram oxalate) .[name]... Take 1/2 tab by mouth once daily x 6 days, then stop    Venlafaxine Hcl 37.5 Mg Xr24h-tab (Venlafaxine hcl) .[name]... 1 by mouth once daily x 2 weeks, then 2 by mouth once daily    Venlafaxine Hcl 75 Mg Xr24h-tab (Venlafaxine hcl) .[name]... 1 by mouth once daily - start 09/10/09  Orders: Prescription Created Electronically [phone])  Problem # 2:  CONSTIPATION (ICD-564.00) ?exac by tramadol - add Align and daily miralax -  consider need for GI eval if still unimproved  Her updated medication list for this problem includes:    Miralax Powd (Polyethylene glycol 3350) .Marland KitchenMarland KitchenMarland KitchenMarland Kitchen 17g (scoop) once daily with 8oz water  Problem # 3:  ARTHRITIS (ICD-716.90)  will refer to PT for this and back pain - on tylenol (max dose 4g/d reviewed with pt/dtr), tramadol as needed - trial mobic ineffective in past  Orders: Physical Therapy Referral (PT)  Complete Medication List: 1)  Cardizem Cd 180 Mg Xr24h-cap (Diltiazem hcl coated beads) .... Take 1 po qd 2)  Quinapril Hcl 20 Mg Tabs (Quinapril hcl) .... Take 1 by mouth qd 3)  Tekturna 150 Mg Tabs (Aliskiren fumarate) .... Take 1 po qd 4)  Zocor 40 Mg Tabs (Simvastatin) .... Take 1 at bedtime 5)  Coreg 25 Mg Tabs (Carvedilol) .... Take 1/2 two times a day 6)  Aspirin 81 Mg Tbec (Aspirin) .... Take 1 by mouth qd 7)  Prilosec 20 Mg Cpdr (Omeprazole) .... Take 1 po qd 8)  Nitroglycerin 0.6 Mg/hr Pt24 (Nitroglycerin) .... Use prn 9)  Tramadol Hcl 50 Mg Tabs (Tramadol hcl) .... Take 1 three times a day as needed 10)  Ra Arthritis Pain Relief 650 Mg Cr-tabs (Acetaminophen) .... Take 6-8 by mouth once daily 11)  Fish Oil 1000 Mg Caps (Omega-3 fatty acids) .... Take 1 by mouth qd 12)  Caltrate 600+d 600-400 Mg-unit Tabs (Calcium carbonate-vitamin d) .... Once daily 13)  [facility]vs Spectravite Tabs (Multiple vitamins-minerals) .... Once daily 14)  Vitamin D 1000 Unit Tabs (Cholecalciferol) .... Once  daily 15)  Vitamin C 500 Mg Tabs (Ascorbic acid) .... Once daily 16)  Lexapro 10 Mg Tabs (Escitalopram oxalate) .... Take 1/2 tab by mouth once daily x 6 days, then stop 17)  Venlafaxine Hcl 37.5 Mg Xr24h-tab (Venlafaxine hcl) .[name].. 1 by mouth once daily x 2 weeks, then 2 by mouth once daily 18)  Venlafaxine Hcl 75 Mg Xr24h-tab (Venlafaxine hcl) .[name].. 1 by mouth once daily - start 09/10/09 19)  Align Caps (Probiotic product) .[name].. 1 by mouth once daily 20)  Miralax Powd (Polyethylene glycol 3350) .[name].. 17g (scoop) once daily with 8oz water  Patient Instructions: 1)  it was good to see you today. 2)  will taper off lexapro as discussed - 1/2 tab daily x next 6 days then stop --  3)  start now generic effexor - 37.5 once daily x 2 weeks then increase to 75mg  (2 tabs) once daily  -  4)  also new presciption for 75mg  strength tabs to pick up starting next month - your prescriptions have been electronically submitted to your pharmacy. Please take as directed. Contact our office if you believe you're having problems with the medication(s).  5)  for constipation, add Miralax and Align daily as discussed 6)  we'll make referral to physical therapy for your back and knee. Our office will contact you regarding this appointment once made.  7)  Please schedule a follow-up appointment in 6-8 weeks for depression, sooner if problems.  Prescriptions: VENLAFAXINE HCL 75 MG XR24H-TAB (VENLAFAXINE HCL) 1 by mouth once daily - start 09/10/09  #30 x 5   Entered and Authorized by:   Newt Lukes MD   Signed by:   Newt Lukes MD on 08/13/2009   Method used:   [facility]        [facility] [address] [phone]* (retail)       [address]       [place], [place]  [address]       Ph: [phone] or [phone]       Fax: [phone]   RxID:   [id number] VENLAFAXINE HCL 37.5 MG XR24H-TAB (VENLAFAXINE HCL) 1 by mouth once daily x 2 weeks, then 2 by mouth once daily  #42 x 0    Entered and Authorized by:   Newt Lukes MD   Signed by:   Newt Lukes MD on 08/13/2009   Method used:   [facility]        [facility] [address] [phone]* (retail)       [address]       [place], [place]  [address]       Ph: [phone] or [phone]       Fax: [phone]   RxID:   [id number]

## 2010-08-11 NOTE — Letter (Signed)
Summary: TB Skin Test  All     ,     Phone:   Fax:           TB Skin Test    Julia Black 02-Dec-1918    Date TB Test Placed:  Monday July 11th 2011 L  forearm  TB Test Placed by:  Lucious Groves CMA  Lot #:  [id number]       Expiration Date: 12/05/2011  Date TB Test Read:  Wednesday July 13th 2011  Result < 5 MM  Negative  TB Test Read by:  Margaret Pyle, CMA

## 2010-08-11 NOTE — Progress Notes (Signed)
----   Converted from flag ---- ---- 10/04/2009 10:01 AM, Newt Lukes MD wrote: i called [phone] - got pt appointment with the NP paula gunther tomorrow 10/05/09 at 1030 am - i [facility] for pt re: same - no other GI appt need at this time - thanks  ---- 10/02/2009 11:27 AM, Newt Lukes MD wrote: i spoke with dr. Arlyce Dice in person on Fri 3/25 - call [phone] to speak with his nurse who will arrange work in week after next  ---- 10/01/2009 2:58 PM, Dagoberto Reef wrote: Dr Annye Rusk, the next avail is with Dr Marina Goodell 11/03/09 @ 10am is this ok ?   Thanks ------------------------------

## 2010-08-11 NOTE — Progress Notes (Signed)
Summary: Vericification?  Phone Note From Pharmacy   Caller: [facility] Summary of Call: Pharmacy called to inform MD that pt has been getting Venlafaxine 75mg  plain for last 3 month (May, June and July) prescribed by another MD. Pharmacy received RX for XR and is unsure which medication to refill. After speaking with pt she states that she did not notice a difference but she does have enough of her current medication (plain tabs) tolast until VAL returns to office. I advised pharmacy to hold on Rx until VAL returns to review and advise on correct medication. Initial call taken by: Margaret Pyle, CMA,  March 10, 2010 3:40 PM  Follow-up for Phone Call        ok to fill venlafax plain - emr list changed but i did not send new erx - thanks Follow-up by: Newt Lukes MD,  March 15, 2010 8:21 AM  Additional Follow-up for Phone Call Additional follow up Details #1::        Pharmacist at [facility] informed Additional Follow-up by: Margaret Pyle, CMA,  March 15, 2010 9:04 AM    New/Updated Medications: VENLAFAXINE HCL 75 MG TABS (VENLAFAXINE HCL) 1 by mouth once daily

## 2010-08-12 ENCOUNTER — Inpatient Hospital Stay (HOSPITAL_COMMUNITY): Payer: Medicare Other

## 2010-08-12 ENCOUNTER — Encounter (HOSPITAL_COMMUNITY): Payer: Self-pay | Admitting: Radiology

## 2010-08-12 LAB — HEMOGLOBIN A1C: Hgb A1c MFr Bld: 5.4 % (ref <5.7)

## 2010-08-12 LAB — CARDIAC PANEL(CRET KIN+CKTOT+MB+TROPI)
CK, MB: 1.3 ng/mL (ref 0.3–4.0)
Troponin I: 0.03 ng/mL (ref 0.00–0.06)

## 2010-08-12 LAB — LIPID PANEL
HDL: 58 mg/dL (ref >39)
LDL Cholesterol: 96 mg/dL (ref 0–99)
Triglycerides: 78 mg/dL (ref <150)
VLDL: 16 mg/dL (ref 0–40)

## 2010-08-12 LAB — MRSA PCR SCREENING: MRSA by PCR: POSITIVE — AB

## 2010-08-12 MED ORDER — TECHNETIUM TC 99M TETROFOSMIN IV KIT
30.0000 | PACK | Freq: Once | INTRAVENOUS | Status: AC | PRN
  Administered 2010-08-12: 30 via INTRAVENOUS

## 2010-08-12 MED ORDER — TECHNETIUM TC 99M TETROFOSMIN IV KIT
10.0000 | PACK | Freq: Once | INTRAVENOUS | Status: AC | PRN
  Administered 2010-08-12: 10 via INTRAVENOUS

## 2010-08-12 MED ORDER — IOHEXOL 300 MG/ML  SOLN: 100.0000 mL | Freq: Once | INTRAMUSCULAR | Status: DC | PRN

## 2010-08-12 NOTE — Letter (Signed)
Summary: [facility]  [facility]   Imported By: Sherian Rein 04/29/2010 10:30:20  _____________________________________________________________________  External Attachment:    Type:   Image     Comment:   External Document

## 2010-08-13 ENCOUNTER — Inpatient Hospital Stay (HOSPITAL_COMMUNITY): Payer: Self-pay

## 2010-08-14 LAB — URINE CULTURE
Colony Count: >=100000
Culture  Setup Time: 201202021713

## 2010-08-17 ENCOUNTER — Telehealth: Payer: Self-pay | Admitting: Internal Medicine

## 2010-08-17 NOTE — Progress Notes (Signed)
Summary: BP reading-HHRN  Phone Note Other Incoming   Caller: [facility] Summary of Call: HHRN from [facility] called to inform about pt's BP readings today. Right Arm 172/84, Left Arm 162/80. Per [facility], pt saw cardiologist yesterday and her medications were changed.  Coreg is now 12.5mg  two times a day and Quinapril is 5mg  two times a day. HHRN also informed pt's cardiologist office of readings but wanted you to be aware of med changes Initial call taken by: Brenton Grills CMA Duncan Dull),  August 10, 2010 11:41 AM  Follow-up for Phone Call        noted - as meds just changed, will not change again at this time - cont to monitor and call if remains elevated Follow-up by: Newt Lukes MD,  August 10, 2010 11:54 AM

## 2010-08-24 ENCOUNTER — Encounter: Payer: Self-pay | Admitting: Internal Medicine

## 2010-08-24 ENCOUNTER — Telehealth: Payer: Self-pay | Admitting: Internal Medicine

## 2010-08-25 ENCOUNTER — Telehealth: Payer: Self-pay | Admitting: Internal Medicine

## 2010-08-25 NOTE — Progress Notes (Signed)
Summary: Discharged from [facility]  Phone Note From Other Clinic Call back at [phone]   Caller: Ander Slade with [facility] Summary of Call: Nyoka Lint with [facility] called to inform you they are discharging the patient from their care today. Nyoka Lint call back number is [phone]. Initial call taken by: Robin Ewing CMA Duncan Dull),  August 17, 2010 11:50 AM  Follow-up for Phone Call        ok Follow-up by: Newt Lukes MD,  August 17, 2010 12:20 PM

## 2010-08-25 NOTE — Miscellaneous (Signed)
Summary: Care Plans/[facility]  Care Plans/[facility]   Imported By: Sherian Rein 08/15/2010 09:11:45  _____________________________________________________________________  External Attachment:    Type:   Image     Comment:   External Document

## 2010-08-25 NOTE — Discharge Summary (Signed)
NAMEMILISA, Julia Black                   ACCOUNT NO.:  [id number]  MEDICAL RECORD NO.:  [id number]           PATIENT TYPE:  I  LOCATION:  4709                         FACILITY:  [facility]  PHYSICIAN:  Thurmon Fair, MD     DATE OF BIRTH:  24-May-1919  DATE OF ADMISSION:  08/11/2010 DATE OF DISCHARGE:  08/12/2010                              DISCHARGE SUMMARY   DISCHARGE DIAGNOSES: 1. Chest pain, atypical. 2. Elevated D-dimer. 3. Urinary tract infection. 4. Hypertension. 5. Bradycardia.  HOSPITAL COURSE:  Ms. Salem is an 77 year old Caucasian female with a history of coronary disease status post coronary bypass grafting x3 January 27, 2003.  She has not had a cardiac catheterization since her bypass grafting, and her last stress test is unknown.  A 2-D echocardiogram from March 07, 2010, revealed normal left ventricular function and size, and ejection fraction of 55-60%.  Her history also includes hypertension, diabetes mellitus type 2, hyperlipidemia, left hip fracture, spinal stenosis, macular degeneration, possible choledocholithiasis.  She underwent ERCP and sphincterotomy in March 2011.  She presented after having systolic blood pressure the previous night  greater than 210.  She also described a burning pain across her chest and back.  It was intermittent, lasts about 30 minutes per episode.  She had 2 episodes during her time in the emergency room.  She also complained of some shortness of breath, which was a little bit more than usual, some nausea, abdominal pain and dysuria, as well as headache, and little bit of dizziness.  She was admitted for observation to rule out acute coronary syndrome.  Cardiac enzymes were cycled and found to be negative.  Cardiac enzymes were negative x3.  BNP was mildly elevated at 146.0.  Urinalysis revealed positive nitrates, large leukocytes, and many bacteria.  She was given 1 g of IV Rocephin.  She was also found to be MRSA positive.  Urine culture  is pending.  Ms. Thurmond is scheduled for [facility] for August 12, 2010.  D-dimer was also drawn and was elevated at 0.62.  She is subsequently scheduled for CT angiogram with contrast for the chest.  As of August 12, 2010, the patient did complain of having similar chest burning that night prior, she apparently had some relief after 3 sublingual nitroglycerin.  LABORATORY DATA:  WBC is 5.1, hemoglobin 13.6, hematocrit 40.8, platelets 177, INR 0.91.  PT 12.5, D-dimer 0.62.  Sodium 138, potassium 3.6, chloride 102, carbon dioxide 28, glucose 94, BUN 12, creatinine 0.60, total bilirubin 0.7, alk phos 61, AST 26, ALT 22, total protein 6.2, albumin 3.4, calcium 8.9, hemoglobin A1c was 5.4, mean plasma glucose 108, BNP was 146, total cholesterol 170, triglycerides 78, HDL 58, LDL 96.  Thyroid stimulating hormone was 2.312, and she is MRSA positive.  Urinalysis revealed positive nitrite, large leukocytes, and many bacteria.  STUDIES/PROCEDURES:  Chest x-ray on August 11, 2010.  Impression; no acute cardiopulmonary findings.  Emphysematous changes noted.  DISCHARGE MEDICATIONS: 1. Acetaminophen 325 mg 2 tablets by mouth every 4 hours as needed for     pain. 2. Align 4 mg 1 tablet  mouth daily. 3. Ambien 10 1 tablet by mouth daily at bedtime as needed for sleep. 4. Aspirin 81 mg 1 tablet by mouth daily. 5. Calcium carbonate/vitamin D 600/400 units 1 tablet by mouth daily. 6. [facility]S Spectravite over-the-counter 1 tablet by mouth daily. 7. Coreg 25 mg one-half tablet by mouth twice daily. 8. Effexor XR 75 mg 1 capsule by mouth daily. 9. Lasix 40 mg 1 tablet by mouth daily as needed for edema. 10.Fish oil 1000 mg 1 capsule by mouth daily at bedtime. 11.Hydrochlorothiazide 12.5 mg 1 capsule by mouth on Monday,     Wednesday, and Friday. 12.Hydrocodone/acetaminophen 5/325 mg 1 tablet by mouth every 6 hours     as needed for pain. 13.MiraLax 17 g 1 capful with 8 ounces of water daily as  needed for     constipation. 14.Omeprazole 20 mg 1 capsule by mouth daily. 15.Potassium chloride 20 mEq 1 tablet by mouth daily as needed to be     taken with furosemide for swallowing. 16.Quinapril 5 mg 1 tablet by mouth twice daily. 17.Tramadol 50 mg 1 tablet by mouth 3 times a day as needed for pain. 18.Vitamin C 500 mg 1 tablet by mouth daily. 19.Vitamin D3 1000 units 1 capsule by mouth daily at bedtime. 20.Zocor 40 mg 1 tablet by mouth daily at bedtime.  DISPOSITION:  Pending.    ______________________________ Wilburt Finlay, PA   ______________________________ Thurmon Fair, MD    BH/MEDQ  D:  08/12/2010  T:  08/13/2010  Job:  [id number]  cc:   Thurmon Fair, MD  Electronically Signed by Wilburt Finlay PA on 08/24/2010 03:47:27 PM Electronically Signed by Thurmon Fair M.D. on 08/25/2010 01:10:33 PM

## 2010-08-31 NOTE — Progress Notes (Signed)
Summary: UTI  Phone Note Call from Patient Call back at Home Phone [phone]   Caller: Patient Call For: Newt Lukes MD Reason for Call: Talk to Doctor Summary of Call: Pt states she was d/c from hosp on Feb 3. While she was in hosp thay treated her for UTI. Gave antibiotic intravenous, but pt states her sympton has came back. Pt is wanting to know will md send antibiotic to her pharmacy she is not able to come in. Pls advise Initial call taken by: Orlan Leavens RMA,  August 24, 2010 4:12 PM  Follow-up for Phone Call        dc summary reviewed - received iv rocephin - will tx with ceftin x 7 days  Follow-up by: Newt Lukes MD,  August 24, 2010 4:20 PM  Additional Follow-up for Phone Call Additional follow up Details #1::        Notified pt md sent antibiotic to [facility]. Also enter labs that was done while she was in hosp Additional Follow-up by: Orlan Leavens RMA,  August 24, 2010 4:28 PM    New/Updated Medications: CEFTIN 250 MG TABS (CEFUROXIME AXETIL) 1 by mouth two times a day x 7days Prescriptions: CEFTIN 250 MG TABS (CEFUROXIME AXETIL) 1 by mouth two times a day x 7days  #14 x 0   Entered and Authorized by:   Newt Lukes MD   Signed by:   Newt Lukes MD on 08/24/2010   Method used:   Electronically to        [facility] (retail)       [address]       [place], [place]  [id number]       Ph: [phone] or [phone]       Fax: [phone]   RxID:   [id number]    -  Date:  08/11/2010    WBC: 5.1    HGB: 13.6    HCT: 40.8    RBC: 4.44    PLT: 177    MCV: 91.9    RDW: 14.3    BG Random: 94    BUN: 12    Creatinine: 0.60    Sodium: 138    Potassium: 3.6    Chloride: 102    CO2 Total: 28    SGOT (AST): 26    SGPT (ALT): 22    T. Bilirubin: 0.7    Alk Phos: 61    Calcium: 8.9    Total Protein: 6.2    Albumin: 3.4    Cholesterol: 170    LDL: 96    HDL: 58    Triglycerides: 78

## 2010-08-31 NOTE — Letter (Signed)
Summary: Mihai Croitoru MD/[facility]  Mihai Croitoru MD/[facility]   Imported By: Lester Blossburg 08/22/2010 09:16:48  _____________________________________________________________________  External Attachment:    Type:   Image     Comment:   External Document

## 2010-08-31 NOTE — Progress Notes (Signed)
Summary: Rx refill req  Phone Note Refill Request Message from:  Fax from Pharmacy on August 25, 2010 8:46 AM  Refills Requested: Medication #1:  HYDROCODONE-ACETAMINOPHEN 5-325 MG TABS take 1 q 6 hours as needed # 60   Last Refilled: 07/19/2010 [facility] [phone] Is this ok to refill?   Method Requested: Fax to Local Pharmacy Initial call taken by: Orlan Leavens RMA,  August 25, 2010 8:46 AM  Follow-up for Phone Call        yes - signed rx on triage b desk Follow-up by: Newt Lukes MD,  August 25, 2010 12:27 PM  Additional Follow-up for Phone Call Additional follow up Details #1::        Rx faxed to pharmacy Additional Follow-up by: Margaret Pyle, CMA,  August 25, 2010 1:22 PM    Prescriptions: HYDROCODONE-ACETAMINOPHEN 5-325 MG TABS (HYDROCODONE-ACETAMINOPHEN) take 1 q 6 hours as needed  #60 x 6   Entered and Authorized by:   Newt Lukes MD   Signed by:   Newt Lukes MD on 08/25/2010   Method used:   Printed then faxed to ...       [facility] (retail)       [address]       [place], [place]  [address]       Ph: [phone] or [phone]       Fax: [phone]   RxID:   [id number]

## 2010-09-05 ENCOUNTER — Telehealth: Payer: Self-pay | Admitting: Internal Medicine

## 2010-09-13 ENCOUNTER — Encounter: Payer: Self-pay | Admitting: Internal Medicine

## 2010-09-15 NOTE — Progress Notes (Signed)
Summary: Julia Black  Phone Note Refill Request Message from:  Fax from Pharmacy on September 05, 2010 11:39 AM  Refills Requested: Medication #1:  AMBIEN 10 MG TABS 1 by mouth at bedtime prn   Last Refilled: 08/02/2010 [facility] [phone] Is this ok to refill?   Method Requested: Fax to Local Pharmacy Initial call taken by: Orlan Leavens RMA,  September 05, 2010 11:39 AM  Follow-up for Phone Call        yes Follow-up by: Newt Lukes MD,  September 05, 2010 12:38 PM  Additional Follow-up for Phone Call Additional follow up Details #1::        Faxed paper request back to [facility]. Updated EMR Additional Follow-up by: Orlan Leavens RMA,  September 05, 2010 1:05 PM    Prescriptions: AMBIEN 10 MG TABS (ZOLPIDEM TARTRATE) 1 by mouth at bedtime prn  #30 x 1   Entered by:   Orlan Leavens RMA   Authorized by:   Newt Lukes MD   Signed by:   Orlan Leavens RMA on 09/05/2010   Method used:   Historical   RxID:   [id number]

## 2010-09-22 LAB — POCT CARDIAC MARKERS
CKMB, poc: 1.1 ng/mL (ref 1.0–8.0)
CKMB, poc: 1.4 ng/mL (ref 1.0–8.0)
Myoglobin, poc: 73.9 ng/mL (ref 12–200)

## 2010-09-22 LAB — POCT I-STAT, CHEM 8
Glucose, Bld: 154 mg/dL — ABNORMAL HIGH (ref 70–99)
HCT: 40 % (ref 36.0–46.0)
Hemoglobin: 13.6 g/dL (ref 12.0–15.0)
Potassium: 4 mEq/L (ref 3.5–5.1)
Sodium: 136 mEq/L (ref 135–145)

## 2010-09-22 LAB — URINALYSIS, ROUTINE W REFLEX MICROSCOPIC
Glucose, UA: NEGATIVE mg/dL
Protein, ur: NEGATIVE mg/dL
Specific Gravity, Urine: 1.012 (ref 1.005–1.030)
Urobilinogen, UA: 0.2 mg/dL (ref 0.0–1.0)

## 2010-09-22 LAB — CARDIAC PANEL(CRET KIN+CKTOT+MB+TROPI)
CK, MB: 1.3 ng/mL (ref 0.3–4.0)
CK, MB: 1.9 ng/mL (ref 0.3–4.0)
Relative Index: INVALID (ref 0.0–2.5)
Relative Index: INVALID (ref 0.0–2.5)
Total CK: 30 U/L (ref 7–177)
Troponin I: 0.02 ng/mL (ref 0.00–0.06)

## 2010-09-22 LAB — BASIC METABOLIC PANEL
BUN: 24 mg/dL — ABNORMAL HIGH (ref 6–23)
CO2: 29 mEq/L (ref 19–32)
Calcium: 9 mg/dL (ref 8.4–10.5)
Chloride: 103 mEq/L (ref 96–112)
Creatinine, Ser: 0.73 mg/dL (ref 0.4–1.2)
GFR calc Af Amer: >60 mL/min (ref >60)
GFR calc non Af Amer: >60 mL/min (ref >60)
Glucose, Bld: 107 mg/dL — ABNORMAL HIGH (ref 70–99)
Potassium: 4 mEq/L (ref 3.5–5.1)
Sodium: 136 mEq/L (ref 135–145)
Sodium: 139 mEq/L (ref 135–145)

## 2010-09-22 LAB — PROTIME-INR: Prothrombin Time: 12.2 seconds (ref 11.6–15.2)

## 2010-09-22 LAB — CBC
HCT: 37.2 % (ref 36.0–46.0)
Hemoglobin: 12 g/dL (ref 12.0–15.0)
MCV: 91.9 fL (ref 78.0–100.0)
Platelets: 233 10*3/uL (ref 150–400)
RBC: 4.05 MIL/uL (ref 3.87–5.11)
WBC: 6.9 10*3/uL (ref 4.0–10.5)

## 2010-09-22 LAB — DIFFERENTIAL
Eosinophils Relative: 3 % (ref 0–5)
Lymphocytes Relative: 20 % (ref 12–46)
Lymphs Abs: 1.4 10*3/uL (ref 0.7–4.0)
Monocytes Absolute: 0.6 10*3/uL (ref 0.1–1.0)
Monocytes Relative: 9 % (ref 3–12)

## 2010-09-22 LAB — TSH: TSH: 4.144 u[IU]/mL (ref 0.350–4.500)

## 2010-09-25 LAB — POCT CARDIAC MARKERS
CKMB, poc: <1 ng/mL — ABNORMAL LOW (ref 1.0–8.0)
CKMB, poc: 2.2 ng/mL (ref 1.0–8.0)
Troponin i, poc: <0.05 ng/mL (ref 0.00–0.09)
Troponin i, poc: <0.05 ng/mL (ref 0.00–0.09)

## 2010-09-25 LAB — URINE CULTURE

## 2010-09-25 LAB — URINE MICROSCOPIC-ADD ON

## 2010-09-25 LAB — DIFFERENTIAL
Basophils Absolute: 0 10*3/uL (ref 0.0–0.1)
Basophils Relative: 0 % (ref 0–1)
Eosinophils Absolute: 0 10*3/uL (ref 0.0–0.7)
Neutro Abs: 8.9 10*3/uL — ABNORMAL HIGH (ref 1.7–7.7)
Neutrophils Relative %: 84 % — ABNORMAL HIGH (ref 43–77)

## 2010-09-25 LAB — POCT I-STAT, CHEM 8
Calcium, Ion: 1.1 mmol/L — ABNORMAL LOW (ref 1.12–1.32)
Chloride: 103 mEq/L (ref 96–112)
Chloride: 107 mEq/L (ref 96–112)
Creatinine, Ser: 0.9 mg/dL (ref 0.4–1.2)
Glucose, Bld: 98 mg/dL (ref 70–99)
HCT: 36 % (ref 36.0–46.0)
HCT: 41 % (ref 36.0–46.0)
Hemoglobin: 12.2 g/dL (ref 12.0–15.0)
Potassium: 4 mEq/L (ref 3.5–5.1)
Potassium: 4.6 mEq/L (ref 3.5–5.1)
Sodium: 133 mEq/L — ABNORMAL LOW (ref 135–145)

## 2010-09-25 LAB — URINALYSIS, ROUTINE W REFLEX MICROSCOPIC
Bilirubin Urine: NEGATIVE
Hgb urine dipstick: NEGATIVE
Ketones, ur: NEGATIVE mg/dL
Nitrite: POSITIVE — AB
Urobilinogen, UA: 0.2 mg/dL (ref 0.0–1.0)

## 2010-09-25 LAB — CBC
Hemoglobin: 11.8 g/dL — ABNORMAL LOW (ref 12.0–15.0)
MCHC: 34.3 g/dL (ref 30.0–36.0)
Platelets: 206 10*3/uL (ref 150–400)
RDW: 14.6 % (ref 11.5–15.5)

## 2010-09-27 ENCOUNTER — Telehealth: Payer: Self-pay

## 2010-09-27 DIAGNOSIS — F419 Anxiety disorder, unspecified: Secondary | ICD-10-CM

## 2010-09-27 DIAGNOSIS — I4891 Unspecified atrial fibrillation: Secondary | ICD-10-CM | POA: Insufficient documentation

## 2010-09-27 MED ORDER — ALPRAZOLAM 0.25 MG PO TABS: 0.2500 mg | ORAL_TABLET | Freq: Every day | ORAL | Status: DC

## 2010-09-27 NOTE — Telephone Encounter (Signed)
Pt's daughter called requesting Rx for Xanax 0.25 qd for pt. Per daughter, pt was Rx this medication by Cardiologist but in limited quantity until Rx can be obtained from PCP, please advise

## 2010-09-27 NOTE — Telephone Encounter (Signed)
Ok to fill as requested - med rx printed and signed

## 2010-09-27 NOTE — Letter (Signed)
Summary: [facility]  [facility]   Imported By: Sherian Rein 09/23/2010 12:20:31  _____________________________________________________________________  External Attachment:    Type:   Image     Comment:   External Document

## 2010-09-28 LAB — HEPATIC FUNCTION PANEL
ALT: 23 U/L (ref 0–35)
AST: 20 U/L (ref 0–37)
Alkaline Phosphatase: 120 U/L — ABNORMAL HIGH (ref 39–117)
Bilirubin, Direct: 0.1 mg/dL (ref 0.0–0.3)
Indirect Bilirubin: 0.5 mg/dL (ref 0.3–0.9)
Total Bilirubin: 0.6 mg/dL (ref 0.3–1.2)

## 2010-09-28 LAB — CBC
HCT: 24.3 % — ABNORMAL LOW (ref 36.0–46.0)
Hemoglobin: 8.2 g/dL — ABNORMAL LOW (ref 12.0–15.0)
MCV: 98.1 fL (ref 78.0–100.0)
Platelets: 325 10*3/uL (ref 150–400)
RBC: 2.47 MIL/uL — ABNORMAL LOW (ref 3.87–5.11)
WBC: 9.6 10*3/uL (ref 4.0–10.5)

## 2010-09-28 LAB — BASIC METABOLIC PANEL
Chloride: 102 mEq/L (ref 96–112)
GFR calc Af Amer: >60 mL/min (ref >60)
GFR calc non Af Amer: >60 mL/min (ref >60)
Potassium: 4 mEq/L (ref 3.5–5.1)
Sodium: 135 mEq/L (ref 135–145)

## 2010-09-28 LAB — GLUCOSE, CAPILLARY
Glucose-Capillary: 115 mg/dL — ABNORMAL HIGH (ref 70–99)
Glucose-Capillary: 122 mg/dL — ABNORMAL HIGH (ref 70–99)
Glucose-Capillary: 89 mg/dL (ref 70–99)

## 2010-09-29 LAB — CBC
HCT: 46 % (ref 36.0–46.0)
Platelets: 238 10*3/uL (ref 150–400)
RDW: 12.7 % (ref 11.5–15.5)
WBC: 6.8 10*3/uL (ref 4.0–10.5)

## 2010-09-29 LAB — COMPREHENSIVE METABOLIC PANEL
AST: 31 U/L (ref 0–37)
Albumin: 3.2 g/dL — ABNORMAL LOW (ref 3.5–5.2)
Alkaline Phosphatase: 73 U/L (ref 39–117)
BUN: 12 mg/dL (ref 6–23)
Chloride: 101 mEq/L (ref 96–112)
GFR calc Af Amer: >60 mL/min (ref >60)
Potassium: 4.4 mEq/L (ref 3.5–5.1)
Sodium: 136 mEq/L (ref 135–145)
Total Protein: 7.3 g/dL (ref 6.0–8.3)

## 2010-09-29 LAB — URINALYSIS, ROUTINE W REFLEX MICROSCOPIC
Bilirubin Urine: NEGATIVE
Glucose, UA: NEGATIVE mg/dL
Hgb urine dipstick: NEGATIVE
Protein, ur: NEGATIVE mg/dL
Specific Gravity, Urine: 1.006 (ref 1.005–1.030)

## 2010-09-29 LAB — DIFFERENTIAL
Basophils Relative: 0 % (ref 0–1)
Eosinophils Relative: 1 % (ref 0–5)
Monocytes Absolute: 0.5 10*3/uL (ref 0.1–1.0)
Monocytes Relative: 8 % (ref 3–12)
Neutro Abs: 4.9 10*3/uL (ref 1.7–7.7)

## 2010-10-02 LAB — TYPE AND SCREEN
ABO/RH(D): O NEG
Antibody Screen: NEGATIVE
Antibody Screen: NEGATIVE

## 2010-10-02 LAB — CBC
HCT: 27.2 % — ABNORMAL LOW (ref 36.0–46.0)
HCT: 32.4 % — ABNORMAL LOW (ref 36.0–46.0)
HCT: 37.3 % (ref 36.0–46.0)
Hemoglobin: 12.6 g/dL (ref 12.0–15.0)
Hemoglobin: 8.2 g/dL — ABNORMAL LOW (ref 12.0–15.0)
MCHC: 33.8 g/dL (ref 30.0–36.0)
MCV: 98.8 fL (ref 78.0–100.0)
MCV: 99.3 fL (ref 78.0–100.0)
MCV: 99.6 fL (ref 78.0–100.0)
Platelets: 280 10*3/uL (ref 150–400)
Platelets: 285 10*3/uL (ref 150–400)
Platelets: 341 10*3/uL (ref 150–400)
Platelets: 411 K/uL — ABNORMAL HIGH (ref 150–400)
RBC: 3.77 MIL/uL — ABNORMAL LOW (ref 3.87–5.11)
RDW: 14.1 % (ref 11.5–15.5)
RDW: 14.3 % (ref 11.5–15.5)
RDW: 14.4 % (ref 11.5–15.5)
WBC: 12.4 10*3/uL — ABNORMAL HIGH (ref 4.0–10.5)
WBC: 13 10*3/uL — ABNORMAL HIGH (ref 4.0–10.5)
WBC: 20.6 10*3/uL — ABNORMAL HIGH (ref 4.0–10.5)
WBC: 8.2 10*3/uL (ref 4.0–10.5)
WBC: 9.8 10*3/uL (ref 4.0–10.5)

## 2010-10-02 LAB — COMPREHENSIVE METABOLIC PANEL WITH GFR
ALT: 69 U/L — ABNORMAL HIGH (ref 0–35)
AST: 41 U/L — ABNORMAL HIGH (ref 0–37)
Albumin: 2.8 g/dL — ABNORMAL LOW (ref 3.5–5.2)
Alkaline Phosphatase: 246 U/L — ABNORMAL HIGH (ref 39–117)
Calcium: 8.7 mg/dL (ref 8.4–10.5)
GFR calc Af Amer: >60 mL/min (ref >60)
Potassium: 3.7 meq/L (ref 3.5–5.1)
Sodium: 140 meq/L (ref 135–145)
Total Protein: 6.3 g/dL (ref 6.0–8.3)

## 2010-10-02 LAB — GLUCOSE, CAPILLARY
Glucose-Capillary: 121 mg/dL — ABNORMAL HIGH (ref 70–99)
Glucose-Capillary: 126 mg/dL — ABNORMAL HIGH (ref 70–99)
Glucose-Capillary: 135 mg/dL — ABNORMAL HIGH (ref 70–99)
Glucose-Capillary: 171 mg/dL — ABNORMAL HIGH (ref 70–99)
Glucose-Capillary: 94 mg/dL (ref 70–99)
Glucose-Capillary: 96 mg/dL (ref 70–99)
Glucose-Capillary: 98 mg/dL (ref 70–99)

## 2010-10-02 LAB — PROTIME-INR
INR: 0.93 (ref 0.00–1.49)
INR: 1.24 (ref 0.00–1.49)
INR: 1.48 (ref 0.00–1.49)
Prothrombin Time: 12.4 seconds (ref 11.6–15.2)
Prothrombin Time: 17.8 seconds — ABNORMAL HIGH (ref 11.6–15.2)

## 2010-10-02 LAB — DIFFERENTIAL
Basophils Absolute: 0 10*3/uL (ref 0.0–0.1)
Basophils Relative: 0 % (ref 0–1)
Eosinophils Absolute: 0 K/uL (ref 0.0–0.7)
Eosinophils Relative: 0 % (ref 0–5)
Lymphocytes Relative: 5 % — ABNORMAL LOW (ref 12–46)
Lymphs Abs: 0.9 K/uL (ref 0.7–4.0)
Monocytes Absolute: 1.2 K/uL — ABNORMAL HIGH (ref 0.1–1.0)
Monocytes Relative: 6 % (ref 3–12)
Neutro Abs: 18.5 10*3/uL — ABNORMAL HIGH (ref 1.7–7.7)
Neutrophils Relative %: 90 % — ABNORMAL HIGH (ref 43–77)

## 2010-10-02 LAB — COMPREHENSIVE METABOLIC PANEL
ALT: 47 U/L — ABNORMAL HIGH (ref 0–35)
AST: 21 U/L (ref 0–37)
Albumin: 2 g/dL — ABNORMAL LOW (ref 3.5–5.2)
Alkaline Phosphatase: 123 U/L — ABNORMAL HIGH (ref 39–117)
Alkaline Phosphatase: 168 U/L — ABNORMAL HIGH (ref 39–117)
BUN: 14 mg/dL (ref 6–23)
BUN: 24 mg/dL — ABNORMAL HIGH (ref 6–23)
CO2: 26 mEq/L (ref 19–32)
CO2: 27 mEq/L (ref 19–32)
CO2: 29 mEq/L (ref 19–32)
Calcium: 8.1 mg/dL — ABNORMAL LOW (ref 8.4–10.5)
Calcium: 8.2 mg/dL — ABNORMAL LOW (ref 8.4–10.5)
Chloride: 102 mEq/L (ref 96–112)
Chloride: 108 mEq/L (ref 96–112)
Creatinine, Ser: 0.5 mg/dL (ref 0.4–1.2)
Creatinine, Ser: 0.69 mg/dL (ref 0.4–1.2)
GFR calc Af Amer: >60 mL/min (ref >60)
GFR calc non Af Amer: >60 mL/min (ref >60)
GFR calc non Af Amer: >60 mL/min (ref >60)
GFR calc non Af Amer: >60 mL/min (ref >60)
Glucose, Bld: 108 mg/dL — ABNORMAL HIGH (ref 70–99)
Glucose, Bld: 136 mg/dL — ABNORMAL HIGH (ref 70–99)
Glucose, Bld: 94 mg/dL (ref 70–99)
Potassium: 3.6 mEq/L (ref 3.5–5.1)
Sodium: 133 mEq/L — ABNORMAL LOW (ref 135–145)
Sodium: 135 mEq/L (ref 135–145)
Sodium: 137 mEq/L (ref 135–145)
Total Bilirubin: 0.7 mg/dL (ref 0.3–1.2)
Total Bilirubin: 0.8 mg/dL (ref 0.3–1.2)
Total Bilirubin: 0.8 mg/dL (ref 0.3–1.2)
Total Protein: 5 g/dL — ABNORMAL LOW (ref 6.0–8.3)

## 2010-10-02 LAB — APTT: aPTT: 24 seconds (ref 24–37)

## 2010-10-02 LAB — BASIC METABOLIC PANEL
Calcium: 8.1 mg/dL — ABNORMAL LOW (ref 8.4–10.5)
GFR calc Af Amer: >60 mL/min (ref >60)
GFR calc non Af Amer: >60 mL/min (ref >60)
Glucose, Bld: 94 mg/dL (ref 70–99)
Potassium: 4.3 mEq/L (ref 3.5–5.1)
Sodium: 136 mEq/L (ref 135–145)

## 2010-10-10 ENCOUNTER — Other Ambulatory Visit: Payer: Self-pay | Admitting: Internal Medicine

## 2010-10-12 ENCOUNTER — Encounter: Payer: Self-pay | Admitting: Internal Medicine

## 2010-10-21 ENCOUNTER — Other Ambulatory Visit: Payer: Self-pay | Admitting: *Deleted

## 2010-10-21 MED ORDER — TRAMADOL HCL 50 MG PO TABS: 50.0000 mg | ORAL_TABLET | Freq: Three times a day (TID) | ORAL | Status: DC

## 2010-10-21 MED ORDER — ZOLPIDEM TARTRATE 10 MG PO TABS: 10.0000 mg | ORAL_TABLET | Freq: Every evening | ORAL | Status: DC | PRN

## 2010-10-21 NOTE — Telephone Encounter (Signed)
Faxed paper script to [facility] on zolpidem & tramadol went electonically

## 2010-10-21 NOTE — Telephone Encounter (Signed)
Is this ok to refill?  

## 2010-10-21 NOTE — Telephone Encounter (Signed)
Ok to refill - prescription printed and signed - placed on Julia Black desk  

## 2010-11-09 ENCOUNTER — Other Ambulatory Visit: Payer: Self-pay | Admitting: Internal Medicine

## 2010-11-22 NOTE — Op Note (Signed)
NAME:  Julia Black, Julia Black                   ACCOUNT NO.:  [id number]   MEDICAL RECORD NO.:  [id number]          PATIENT TYPE:  AMB   LOCATION:  DSC                          FACILITY:  [facility]   PHYSICIAN:  Katy Fitch. Sypher, M.D. DATE OF BIRTH:  01/01/1919   DATE OF PROCEDURE:  DATE OF DISCHARGE:                               OPERATIVE REPORT   PREOPERATIVE DIAGNOSIS:  Severe left ulnar neuropathy, left elbow across  cubital tunnel with nerve conduction study revealing a velocity of 28.6  m/sec versus 63 m/sec across the right elbow.   POSTOPERATIVE DIAGNOSIS:  Severe left ulnar neuropathy, left elbow  across cubital tunnel with nerve conduction study revealing a velocity  of 28.6 m/sec versus 63 m/sec across the right elbow with identification  of severe compression of ulnar nerve due to triceps fascia contracture.   OPERATION:  In situ decompression of left ulnar nerve at cubital tunnel  with resection of triceps medial aponeurosis and a portion of the medial  head of the triceps.   OPERATING SURGEON:  Josephine Igo, MD   ASSISTANT:  Annye Rusk PA-C   ANESTHESIA:  General by LMA.   SUPERVISING ANESTHESIOLOGIST:  Zenon Mayo, MD   INDICATIONS:  Julia Black is an 77 year old woman well acquainted with our  practice.  She was referred for evaluation of increasing left arm and  hand numbness and weakness of pinch and grasp involving the left hand.  Clinical examination suggested a significant left ulnar neuropathy at  cubital tunnel level.   Electrodiagnostic studies completed by Dr. Sharmon Revere revealed  mild bilateral carpal tunnel syndrome that is clinically symptomatic,  and a very significant left ulnar neuropathy across the left elbow with  a conduction velocity of 28.6 m/sec versus 63 m/sec on the right.   Due to the failure to respond to nonoperative measures, Julia Black was  brought to the operating at this time anticipating decompression of her  left ulnar  nerve at the cubital tunnel.   Preoperatively, we had discussed the possibility of transposing the  nerve if it was found be unstable.  Our first choice to be to leave the  nerve in situ so as to minimize interference with his blood supply.   PROCEDURE:  Julia Black was brought to the operating room and placed in  supine position on the operating table.   Following placement of an infraclavicular block in the holding area,  excellent anesthesia of the left arm was achieved.   Julia Black was brought to room 8 of the [facility], placed in  supine position on the operating table, and under Dr. Jarrett Ables  direct supervision, sedation provided.   The procedure commenced with careful Betadine scrub and paint of left  upper extremity, placement of a very well-padded pneumatic tourniquet on  the proximal brachium followed by exsanguination of the left arm with an  Esmarch bandage.  Great care was taken to avoid shear on her fragile  skin.   The tourniquet was inflated to 150 mmHg.  The procedure commenced with a  3-cm incision paralleling the  path of the ulnar nerve posterior to the  medial epicondyle of the left elbow.  The subcutaneous tissue was  carefully divided revealing a hypertrophic bursa.  This was cleared off  the region of the triceps aponeurosis directly overlying the ulnar  nerve.  The aponeurosis was quite thickened and was indenting behind the  epicondyle.  The triceps fascia was incised followed by a resection of a  centimeter wide band of the triceps fascia and the aponeurosis overlying  the origin of the flexor carpi ulnaris.   The nerve was decompressed 6 cm above the epicondyle and 6 cm distal to  the epicondyle.   Several fascial bands were noted to be quite tight directly at the  condyle, and upon inspection of the deep surface of the cubital tunnel,  there was noted be some tenting of the medial collateral ligament.   A strip of the medial head of the  triceps measuring approximately 8 mm  in width was resected with a cutting cautery from its insertion on the  olecranon proximally 3 cm.  This fully decompressed the nerve.  With  extension of the elbow, the nerve was noted be slack after  decompression, and with elbow flexion, there was no sign of anterior  subluxation.   The wound was then carefully inspected for bleeding points followed by  irrigation and closure with subdermal sutures of 4-0 Vicryl and  intradermal 3-0 Prolene and a Steri-Strip.   GRAFT CARE:  Julia Black was placed in a compressive dressing of sterile  gauze and a Tegaderm followed by Ace wrap.  She will be placed in a  sling.  She will begin immediate range of motion excises after her block  wears off.   We will see her back in followup in 1 week.   In the preoperative care, she was provided prescription for Darvocet-N  100 1 p.o. q.4-6 h. p.r.n., 20 tablets without refill.  She will resume  all her routine medications.      Katy Fitch Sypher, M.D.  Electronically Signed     RVS/MEDQ  D:  10/17/2007  T:  10/18/2007  Job:  [id number]   cc:   Cristy Hilts. Jacinto Halim, MD

## 2010-11-22 NOTE — Discharge Summary (Signed)
Julia Black, Julia Black                   ACCOUNT NO.:  [id number]   MEDICAL RECORD NO.:  [id number]          PATIENT TYPE:  INP   LOCATION:  2024                         FACILITY:  [facility]   PHYSICIAN:  Nicki Guadalajara, M.D.     DATE OF BIRTH:  1918/11/04   DATE OF ADMISSION:  03/30/2008  DATE OF DISCHARGE:  03/31/2008                               DISCHARGE SUMMARY   DISCHARGE DIAGNOSES:  1. Chest pain negative for myocardial infarction, was likely      gastrointestinal.  2. Coronary artery disease with history of bypass grafting in 2004.  3. Hypertension.  4. Hyperlipidemia.  5. Spinal stenosis.  6. Recently began taking erythromycin for presumed sinus infection,      now have discontinued erythromycin.   DISCHARGE CONDITION:  Improved.   PROCEDURES:  None.   DISCHARGE MEDICATIONS:  1. Cardizem CD 180 mg daily.  2. Quinapril 20 mg daily.  3. Tekturna 150 mg daily.  4. Zocor 40 mg nightly.  5. Coreg 25 mg one-half tablet equal 12.5 mg twice daily.  6. Lexapro 10 mg daily.  7. Celebrex 200 mg as needed.  8. Aspirin 81 mg daily.  9. Fosamax weekly as before.  10.Prilosec 20 mg one daily, prescription given.  11.Stop erythromycin.  12.Nitroglycerin July 10[birth year] 1948, sublingual as needed for chest pain.   DISCHARGE INSTRUCTIONS:  1. No strenuous activity until after stress test.  2. Low-sodium heart-healthy diet.  3. You are scheduled for stress test.  You will not walk on April 02, 2008, at 12:30 p.m. on second floor Dr. Verl Dicker and do not eat      or drink after 6:30 a.m. on April 02, 2008, holding meds      April 02, 2008, until after the test.  No caffeine or milk      products until after the test.  4. Follow up with Dr. Jacinto Halim, April 06, 2008, at 4:15 p.m.  5. Stop taking erythromycin.   HISTORY OF PRESENT ILLNESS:  An 77 year old female patient with history  of bypass grafting in 2004, and had been some shortness of breath with  exertion, but her  greatest complaint was abdominal discomfort.  Her  stomach felt upset and she began burning in the right chest and right  arm and around her back.  She had some nausea as well and this was new  and it was different in her chest pain, had been prior to her bypass  surgery.  She was able to sleep for a while, but when she woke up it was  still there.  She did not feel well, so she came to the emergency room.  In the emergency room with oxygen Nitropaste, she felt better.   Past medical history with bypass grafting with LIMA to LAD, saphenous  vein graft to OM1, saphenous vein graft to the RCA.  Her last stress  Myoview had been in September 2008, which was negative.  She has a  history of irregular heartbeat in the past, hypertension,  hyperlipidemia, and spinal stenosis with left  foot.  No numbness and had  had a steroid injection recently.  Family history, social history,  review of systems, see H and P.   PHYSICAL EXAM AT DISCHARGE:  VITAL SIGNS:  Blood pressure 137/64, pulse  59, respiratory rate 18, temperature 97.5, oxygen saturation 95%.  HEART:  S1 and S2.  Regular rate and rhythm.  LUNGS:  With decreased in bases, otherwise clear.  ABDOMEN:  Positive bowel sounds, soft, and nontender.  EXTREMITIES:  Without edema.   LABORATORY DATA:  Hemoglobin 13.9, hematocrit 41.4, WBC 6.5, platelets  224, MCV 100.3-100.6.  These remained stable.  Chemistry:  Sodium 134 to  137, potassium 3.7, chloride 102, CO2 23, BUN 14, creatinine 0.62,  glucose slightly up at 117.   Coags:  Protime 12.5, INR 0.9, PTT 27.   AST 23, ALT 20, alkaline phos 47, total bili 0.9, amylase 86, lipase 24,  and albumin 3.3.  Cardiac enzymes were all negative with CKs range in  32, 35, 28, MB 1.1, and troponin-I 0.01-0.02, calcium 8.6, magnesium  2.1.   TSH was 1.63, folate was greater than 20, and vitamin B12 was 49.   Chest x-ray:  Post CABG, no acute process.   EKG:  Sinus rhythm without acute changes.   First-degree AV block and a  rare PVC.   HOSPITAL COURSE:  The patient was admitted.  She did admit to being  started on erythromycin and had no further complaints of his headache  and no sinus tenderness on exam in the emergency room.  Erythromycin was  discontinued.  We put her on Protonix here in the hospital as this  sounded more GI and did not feel like her normal cardiac issues.  By the  next morning, labs were normal including amylase and lipase.  She had no  further complaints.  She ambulated all morning without pain and was seen  by Dr. Alanda Amass, discussed with Dr. Tresa Endo, and felt she was stable to  go home.  She will have a Lexiscan Myoview this week and follow up with  Dr. Jacinto Halim the first of next  week.  If she has any more problems.  She does know to call our office.  We have put her on Imdur in the hospital, but we did not continue that  as an outpatient.  We will await until we have the next study to see if  it is positive her negative.  The patient and family were agreeable to  this plan of care.      [name]. Annie Paras, N.P.    ______________________________  Nicki Guadalajara, M.D.    LRI/MEDQ  D:  03/31/2008  T:  04/01/2008  Job:  [id number]   cc:   Ralene Ok, M.D.  Cristy Hilts. Jacinto Halim, MD

## 2010-11-25 NOTE — H&P (Signed)
NAME:  Julia [name], Julia [name]                             ACCOUNT NO.:  [id number]   MEDICAL RECORD NO.:  [id number]                   PATIENT TYPE:  INP   LOCATION:  4707                                 FACILITY:  [facility]   PHYSICIAN:  Ermalinda Memos. Gaye Pollack, M.D.            DATE OF BIRTH:  1918-09-08   DATE OF ADMISSION:  01/22/2003  DATE OF DISCHARGE:                                HISTORY & PHYSICAL   PRIMARY CARE Julia [name]:  Last seen at [facility] three  years ago.   CHIEF COMPLAINT:  Left shoulder/chest pain.   HISTORY OF PRESENT ILLNESS:  Mrs. Julia [name] is an 77 year old white female  who has been having intermittent left arm and shoulder pain radiating into  her chest for the last four to five days.  The pain would come and go.  She  was awaken once or twice with the discomfort over the last few days.  She  states that she has had 20-25 episodes, each lasting [name] brief period of time.   She notes some occasional substernal chest discomfort associated with it,  but is unable to describe the discomfort.  She was nauseated this morning  with the first episode today, but denies any dizziness or diaphoresis.  She  is not sure whether or not she had palpitations.  She denies any presyncopal  symptoms.  Her chest and shoulder discomfort are unrelated to any exercise.  It is exacerbated by lifting her left arm.  She started Vioxx last week for  the discomfort and stopped on Friday.   PAST MEDICAL HISTORY:  DJD of both knees.   PAST SURGICAL HISTORY:  1. Carpal tunnel release bilaterally.  2. Appendectomy.  3. Cataract OS.   SOCIAL HISTORY:  Divorced.  Retired from [employer].  Smoked, 10-pack-  year history, and quit 20 years ago.  Consumes rare alcohol.   ALLERGIES:  SPORANOX which causes [name] rash.   FAMILY HISTORY:  [name] brother has had two MIs, [name] pacemaker placement, and an  automatic defibrillator.  Her mother had breast cancer and died of  complications.  Her father  died of COPD.  Her kids (four) are alive and  well.   MEDICATIONS:  None.   REVIEW OF SYSTEMS:  She denies any dyspnea on exertion.  Denies any  exertional chest pain.  She has stable nocturia x 2-3 per night.  No PND or  orthopnea.  No GI or GU symptoms.  She notes DJD in her knees.  The rest of  her 12-point review of systems is negative.   PHYSICAL EXAMINATION:  GENERAL APPEARANCE:  [name] well-developed, well-  nourished, very pleasant, 77 year old, white female, alert and oriented x 3.  Pleasant, cooperative, and in no acute distress.  SKIN:  Warm and dry.  Her color is good.  She is breathing easily on room  air.  VITAL SIGNS:  Afebrile.  Vital  signs otherwise stable.  HEENT:  Normocephalic and atraumatic.  EOMI.  PERRLA.  The sclerae are  anicteric.  There are lower dentures in her mouth.  The oral mucosa is  within normal limits.  NECK:  Supple.  No JVD, adenopathy, thyromegaly, masses, or bruits.  LUNGS:  Clear to auscultation.  CARDIAC:  Regular rhythm.  S1 and S2 within normal limits.  No S3 or S4.  No  rubs, clicks, or murmurs.  ABDOMEN:  Soft and nontender.  Normoactive bowel sounds.  No organomegaly,  masses, or bruits.  RECTAL:  Deferred.  EXTREMITIES:  No cyanosis, clubbing, or edema.  SKIN:  Some dry flaking and some superficial varicosities of the lower  extremities.   LABORATORY DATA:  Serial cardiac enzymes x 3 are negative.  The sodium is  139, potassium 4.1, chloride 108, bicarbonate 23, BUN 15, creatinine 0.7,  and glucose 117.  The white count is 7.6, hemoglobin 14.9, hematocrit 43.8,  and platelet count 265,000.  The lipase is 39.  The chest x-ray shows loose  bodies in the left shoulder, otherwise unremarkable.   ASSESSMENT:  An 77 year old white female with atypical chest and shoulder  pain, presumably degenerative joint disease.  She does have risk factors of  advanced age, as well as her remote smoking history.  She has not seen [name]  physician in  essentially three years.   PLAN:  She will be brought in for observation.  Will consult cardiology for  evaluation with [name] stress test.  Will see if [facility]  will reassume her care.                                               Ermalinda Memos. Gaye Pollack, M.D.    RMS/MEDQ  D:  01/22/2003  T:  01/22/2003  Job:  [id number]   cc:   [facility]    cc:   [facility]

## 2010-11-25 NOTE — Op Note (Signed)
NAME:  Julia [name], Julia [name]                             ACCOUNT NO.:  [id number]   MEDICAL RECORD NO.:  [id number]                   PATIENT TYPE:  INP   LOCATION:  2301                                 FACILITY:  [facility]   PHYSICIAN:  Salvatore Decent. Dorris Fetch, M.D.         DATE OF BIRTH:  05-25-1919   DATE OF PROCEDURE:  01/27/2003  DATE OF DISCHARGE:                                 OPERATIVE REPORT   PREOPERATIVE DIAGNOSES:  Unstable angina with severe three-vessel coronary  disease.   POSTOPERATIVE DIAGNOSES:  Unstable angina with severe three-vessel coronary  disease.   OPERATION PERFORMED:  Median sternotomy, extracorporeal circulation,  coronary artery bypass grafting times three (left internal mammary artery to  left anterior descending, saphenous vein graft to obtuse marginal 1,  saphenous vein graft to distal right coronary artery).   SURGEON:  Salvatore Decent. Dorris Fetch, M.D.   ASSISTANT:  Toribio Harbour, N.P.   ANESTHESIA:  General.   FINDINGS:  Saphenous vein from the left leg was satisfactory.  Mammary was  satisfactory.  Good quality targets.  Sternal osteoporosis.  Atrophic skin.   INDICATIONS FOR PROCEDURE:  Julia [name] is an 77 year old female who presented  with unstable angina.  She underwent cardiac catheterization which revealed  severe three-vessel disease with significant lesions in her LAD and  circumflex and total occlusion chronically of her right coronary which  filled via collaterals.  The patient was referred for coronary artery bypass  grafting.  She had prior bilateral vein strippings; however, vein mapping  revealed what appeared to be adequate conduit in the left thigh.  Therefore  the patient was advised to undergo coronary artery bypass grafting.  The  indications, risks, benefits and alternative procedures were discussed in  detail with the patient and she understood and accepted the risks and agreed  to proceed.   DESCRIPTION OF PROCEDURE:  Julia [name] was  brought to the preop holding area on  January 27, 2003.  There, the anesthesia service placed lines to monitor  arterial, central venous and pulmonary arterial pressure.  Intravenous  antibiotics were administered. The patient was taken to the operating room,  anesthetized and intubated.  [name] Foley catheter was placed.  The chest,  abdomen and legs were then prepped and draped in the usual sterile fashion.   An incision was made in the left groin and the greater saphenous vein was  identified.  This was confirmed by dissecting out to the saphenofemoral  junction.  The greater saphenous vein then was harvested from the left thigh  using bridged open incision technique.  It was [name] satisfactory conduit to the  level of the knee with only mild varicosities.  There were no sclerotic or  stenotic areas in the vein.   [name] median sternotomy was performed and the left internal mammary artery was  harvested in the standard fashion.  Of note the patient did have sternal  osteoporosis.  She had very thin skin both in the chest and the leg.  The  left internal mammary artery was harvested using standard technique.  The  patient was fully heparinized prior to dividing the distal end of mammary  artery.  There was good flow through the cut end of the vessel.   The pericardium was opened.  The ascending aorta was inspected and palpated.  There was no palpable atherosclerotic disease.  The aorta was cannulated via  concentric 2-0 Ethibond pledgeted pursestring sutures. [name] dual stage venous  cannula was placed via pursestring suture in the right atrial appendage.  Cardiopulmonary bypass was instituted and the patient was cooled to 32  degrees C.  The coronary arteries were inspected and anastomotic sites were  chosen.  The conduits were inspected and cut to length.  [name] foam pad was  placed in the pericardium to protect the left phrenic nerve.  [name] temperature  probe was placed in the myocardial septum and [name]  cardioplegia cannula was  placed in the ascending aorta.   The aorta was crossclamped.  The left ventricle was emptied via the aortic  root vent.  Cardiac arrest then was achieved with [name] combination of cold  antegrade blood cardioplegia and topical iced saline.  of cardioplegia  was administered and the myocardial septal temperature was 11 degrees  Celsius.  The following distal anastomoses were performed.   First [name] reversed saphenous vein graft was placed end-to-side to the distal  right coronary artery.  The distal right coronary artery was [name] 2 mm vessel.  It was grafted just above its bifurcation which gave rise to [name] 1.5 mm  posterior descending and [name] 1.5 continuation.  Both of these vessels were  good quality as well.  Saphenous vein was satisfactory quality.  The end-to-  side anastomosis was performed with [name] running 7-0 Prolene suture.  There was  excellent flow through the anastomosis.  Cardioplegia was administered.  There was good hemostasis.   Next, [name] reversed saphenous vein graft was placed end-to-side to the first  obtuse marginal branch of the left circumflex coronary artery.  This was [name]  heavily diseased vessel proximally.  It was of good quality at the site of  anastomosis.  It was 1.5 mm in diameter.  The saphenous vein was of  satisfactory quality and end-to-side anastomosis was performed with [name]  running 7-0 Prolene suture.  At the completion of the anastomosis there was  [name] small leak from the tail which was repaired with [name] single 7-0 Prolene  suture.  There was excellent flow through the graft.   Next the left internal mammary artery was brought through [name] window in the  pericardium.  The distal end was spatulated.  It was then anastomosed end-to-  side to the distal LAD using [name] running 8-0 Prolene suture.  The mammary was  [name] 1.5 mm good quality conduit. The LAD was [name] 1.5 mm good quality target at the site of the anastomosis.  There was plaque just proximal to  the  anastomosis in the LAD but [name] probe did pass easily through this area.  At  the completion of the mammary to LAD anastomosis the bulldog clamp was  removed.  Immediate and rapid septal rewarming was noted.  The bulldog clamp  was replaced.  The mammary pedicle was tacked to the epicardial surface of  the heart with 6-0 Prolene sutures.  Additional cardioplegia was  administered via the aortic root and down the vein  grafts.   The vein grafts were cut to length.  The cardioplegia cannula was removed  from the ascending aorta and the proximal vein graft anastomoses were  performed to 4.0 mm punch aortotomies with running 6-0 Prolene sutures.  At  the completion of the final proximal anastomosis, the patient was placed in  Trendelenburg position.  The bulldog clamp was again removed from the  mammary artery.  Lidocaine was administered.  Immediate and rapid septal  rewarming was noted.  The aortic root was deaired and the aortic cross-clamp  was removed.  The total crossclamp time was 59 minutes.  The patient  initially fibrillated and required [name] single defibrillation with 20 joules.  She then was in bradycardia.   While the patient was being rewarmed, all proximal and distal anastomoses  were inspected for hemostasis.  Epicardial pacing wires were placed on the  right ventricle and right atrium and when the patient had been rewarmed to [name]  core temperature of 37 degrees Celsius, he was weaned from cardiopulmonary  bypass without difficulty.  She was DDD paced and on no inotropic support at  time of separation from bypass.  The total bypass time was 91 minutes.  The  initial cardiac index was greater than 2L per minute per meter squared and  she remained hemodynamically stable throughout the postbypass period.   The test dose of protamine was administered and was well tolerated.  The  atrial and aortic cannulae were removed.  The remainder of the protamine was  administered without  incident.  The chest was irrigated with 1L of warm  normal saline containing 1 gm of vancomycin.  Hemostasis was achieved.  [name]  left pleural and two mediastinal chest tubes were placed through separate  subcostal incisions.  The pericardium was not reapproximated.  The sternum  was closed with [name] combination of single and double heavy gauge stainless  steel wires.  The remainder of incisions were closed in standard fashion.  In the chest and leg, the skin was closed with staples because of its  atrophic nature.  All sponge, needle and instrument counts were correct at  the end of the procedure.  The patient was taken from the operating room to  the surgical intensive care unit in critical but stable condition.                                                 Salvatore Decent Dorris Fetch, M.D.    SCH/MEDQ  D:  01/27/2003  T:  01/27/2003  Job:  [id number]   cc:   Cristy Hilts. Jacinto Halim, M.D.  [address]  [place] [address]  Fax: [phone]

## 2010-11-25 NOTE — Discharge Summary (Signed)
NAME:  Julia [name], Julia [name]                             ACCOUNT NO.:  [id number]   MEDICAL RECORD NO.:  [id number]                   [name] TYPE:  INP   LOCATION:  2024                                 FACILITY:  [facility]   PHYSICIAN:  Salvatore Decent. Dorris Fetch, M.D.         DATE OF BIRTH:  05-Jan-1919   DATE OF ADMISSION:  01/22/2003  DATE OF DISCHARGE:  02/01/2003                                 DISCHARGE SUMMARY   ADMISSION DIAGNOSIS:  Atypical chest pain.   PAST MEDICAL HISTORY:  1. Degenerative joint disease bilateral knees.  2. Past surgical history includes carpal tunnel release bilaterally,     appendectomy, bilateral cataract surgery.   ALLERGIES:  SPORANOX causes [name] rash.   BRIEF HISTORY:  Julia [name] is an 77 year old Caucasian female.  Julia [name] had been  in Julia [name] usual state of health; in fact, Julia [name] has not seen [name] medical care  [name] for the last three years.  Julia [name] presented to [facility] on the afternoon of July 15.  Julia [name] complained of some left arm and  chest pain the previous four to five days.  Julia [name] was evaluated by Dr. Ermalinda Memos. Gaye Pollack whose assessment included atypical chest pain questionably  related to degenerative joint disease, although, due to Julia [name] advanced age and  remote tobacco use, he consulted cardiology regarding [name] stress test.  [name] family requested [facility], and Julia [name] was evaluated by  Dr. Jacinto Halim.  He recommended proceeding with cardiac catheterization.  The  procedure and benefits were all discussed with Julia [name] and Julia [name] family, and  they agreed to proceed with catheterization.   HOSPITAL COURSE:  January 23, 2003, Julia [name] underwent cardiac catheterization.  Julia revealed severe three-vessel coronary artery disease not amenable to  pecutaneous coronary intervention.  Cardiac surgery consult was requested,  and Julia [name] was evaluated later that day by Dr. Charlett Lango.  After  examination of the [name] and review of available  records, Dr. Dorris Fetch  agreed that coronary artery bypass grafting was the preferred treatment  choice for Julia woman; however, Julia [name] appears to have had bilateral vein  stripping in the past; so his plan would be dependent on available conduits.  He recommended proceeding with [name] bilateral lower extremity vein mapping to  evaluate Julia [name] saphenous veins.  Julia was performed on July 19 and revealed  adequate conduit available on the left side.  Dr. Dorris Fetch again  discussed the procedure, risks, and benefits of coronary artery bypass  grafting with Julia [name] and family, and they agreed to proceed with surgery.   July 20, Julia [name] underwent the following procedure with Dr. Charlett Lango: Coronary artery bypass grafting x 3. Grafts placed at time of  procedure: Left internal mammary artery grafted to the left anterior  descending artery, saphenous vein grafted to first obtuse marginal artery,  saphenous vein grafted to the distal right coronary artery.  Vein was  harvested from the left side via open surgical technique.  Julia [name] tolerated  Julia procedure well, transferring in stable condition to the SICU.  Julia [name]  remained hemodynamically stable in the immediate postoperative period.  Julia [name]  was extubated several hours after arrival in the SICU.  Julia [name] emerged from  anesthesia neurologically intact.   Julia [name] postoperative course has been uneventful.  Julia [name] is making very  good progress in recovering from Julia [name] surgery. Julia morning, January 30, 2003,  postoperative day #3, vital signs are stable with blood pressure range 100  to 120/50 to 60.  Julia [name] is afebrile.  Julia [name] heart is in sinus rhythm.  Lungs  show few bibasilar rales; however, Julia [name] is moving air well.  Julia [name] incisions  are healing well.  Julia [name] is ambulating with minimum assistance with cardiac  rehabilitation staff.  If Julia [name] continues to progress in Julia manner, it  is felt Julia [name] should be ready for discharge home on postoperative day   #5, February 01, 2003.   LABORATORY DATA:  July 22 CBC: White blood cells 11.0, hemoglobin 10.6,  hematocrit 31, platelets 119.  July 23 chemistries included sodium 141,  potassium 3.6, BUN 28, creatinine 1.2, glucose 113.  Julia [name] potassium will be  supplemented today, July 23.   CONDITION ON DISCHARGE:  Improved.   DISCHARGE MEDICATIONS:  1. Aspirin 325 mg p.o. daily.  2. Coreg 6.25 mg p.o. b.i.d.  3. Lasix 40 mg p.o. daily x 7 days.  4. Potassium chloride 20 mEq p.o. daily x 7 days.  5. Zocor 20 mg p.o. daily.  6. Julia [name] may resume Vioxx 12.5 mg daily. p.r.n.  7. For pain management, Julia [name] may have Ultram 50 mg 1 to 2 p.o. q.6h. p.r.n.   ACTIVITY:  Julia [name] is asked to refrain from any driving, heavy lifting, pushing,  or pulling. Julia [name] is also instructed to continue breathing exercises and daily  walking.   DIET:  Heart healthy diet.   WOUND CARE:  Julia [name] is to shower daily with warm soap and water.  If Julia [name]  incisions show any signs of infection, or if Julia [name] has [name] fever greater than  101 degrees F, Julia [name] is to call Dr. Sunday Corn office.   FOLLOW UP:  1. Dr. Jacinto Halim should see Julia [name] in his office in approximately two weeks. Julia [name]     has been asked to call to arrange that appointment.  Julia [name] has been given     the phone number.  2. Dr. Dorris Fetch would like to see Julia [name] at CVTS office on Monday, August 9,     at 1:45 p.m.  Julia [name] has been asked to ahve [name] chest x-ray at [facility] at 12:45     that day.      Toribio Harbour, N.P.                  Salvatore Decent Dorris Fetch, M.D.    CTK/MEDQ  D:  01/30/2003  T:  01/31/2003  Job:  [id number]   cc:   Cristy Hilts. Jacinto Halim, M.D.  [address] [place]  [place]  [place] [address]  Fax: [phone]   [facility]  [facility]

## 2010-11-25 NOTE — Op Note (Signed)
NAME:  Julia [name], Julia [name]                             ACCOUNT NO.:  [id number]   MEDICAL RECORD NO.:  [id number]                   PATIENT TYPE:  AMB   LOCATION:  DSC                                  FACILITY:  [facility]   PHYSICIAN:  Katy Fitch. Naaman Plummer., M.D.          DATE OF BIRTH:  August 11, 1918   DATE OF PROCEDURE:  07/15/2002  DATE OF DISCHARGE:                                 OPERATIVE REPORT   PREOPERATIVE DIAGNOSES:  1. 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  2. Entrapment neuropathy median nerve left carpal tunnel.   POSTOPERATIVE DIAGNOSES:  1. 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  2. Entrapment neuropathy median nerve left carpal tunnel.   OPERATION:  1. Release of right transverse carpal ligament, 6472-Rt.  2. Injection of left ulnar bursa, 25026-Lt.   SURGEON:  Katy Fitch. Sypher, M.D.   ASSISTANT:  Jonni Sanger, P.[name].   ANESTHESIA:  General by LMA   SUPERVISING ANESTHESIOLOGIST:  Burna Forts, M.D.   INDICATIONS FOR PROCEDURE:  The patient is an 77 year old woman who has had  history of bilateral hand numbness and discomfort.  Clinical examination  reveals signs of severe bilateral carpal tunnel syndrome and bilateral thumb  CMC arthrorisis.   Due to [name] failure to response to nonoperative measures and with documented  elective diagnostic studies proving significant carpal tunnel syndrome, she  was brought to the operating room at this time for release of her right  transverse carpal ligament.  She requested injection of her left ulnar bursa  as she had [name] previous injection with [name] good result and would like to repeat  this prior to considering left side surgery.   DESCRIPTION OF PROCEDURE:  The patient was brought to the operating room and  placed in the supine position on the operating table.  Following induction  of general anesthesia by LMA, the left arm was prepped with Betadine soap  and solution and sterilely draped.  The  arm was exsanguinated with an  Esmarch bandage and arterial tourniquet was inflated to 200 mmHg.  The  procedure commenced with short incision in the line of the ring finger to  the palm.  Subcutaneous tissues were carefully divided in the middle palmar  fascia.  This was split longitudinally to the common extensor branch of the  median nerve.  These were followed back to the transverse carpal ligament,  median nerve.   The ligament was loose along ulnar border of the transverse carpal ligament  extending into the distal forearm.  This was widely opened the carpal canal.  Cortisone from previous injection was visible within the tenosynovium of the  ulnar bursa but otherwise no problems were noted.  Bleeding points along the  margin of the released ligament were electrocauterized with bipolar current  followed by repair of the skin with intradermal 3-0 Prolene suture.  [name]  pressure compressive dressing was applied  with volar splint to maintain the  wrist in 5 degrees of dorsiflexion.   The left wrist was then prepped with alcohol and injected with [name] 2 cc  mixture of 50% DepoMedrol 40 mg Vermel and 1% plain Lidocaine.  This was  technically [name] satisfactory injection.   The patient was awakened from her sedation and transferred to the recovery  room with stable vital signs.                                               Katy Fitch Naaman Plummer., M.D.    RVS/MEDQ  D:  07/15/2002  T:  07/15/2002  Job:  [id number]

## 2010-11-25 NOTE — Op Note (Signed)
NAME:  Julia [name], Julia [name]                             ACCOUNT NO.:  [id number]   MEDICAL RECORD NO.:  [id number]                   PATIENT TYPE:  AMB   LOCATION:  DSC                                  FACILITY:  [facility]   PHYSICIAN:  Katy Fitch. Naaman Plummer., M.D.          DATE OF BIRTH:  12/05/1918   DATE OF PROCEDURE:  08/19/2002  DATE OF DISCHARGE:                                 OPERATIVE REPORT   PREOPERATIVE DIAGNOSIS:  Entrapment neuropathy, median nerve left carpal  tunnel.   POSTOPERATIVE DIAGNOSIS:  Entrapment neuropathy, median nerve left carpal  tunnel.   OPERATION:  Release of left transverse carpal ligament.   SURGEON:  Katy Fitch. Sypher, M.D.   ASSISTANT:  Marveen Reeks. Dasnoit, P.[name].-C.   ANESTHESIA:  General by mask.   ANESTHESIOLOGIST:  Kaylyn Layer. Michelle Piper, M.D.   INDICATIONS FOR PROCEDURE:  This is an 77 year old woman referred by Dr.  Lenise Arena of [facility], for the evaluation and management of [name]  painful and numb left hand.  She had background osteoarthrosis and CMC  arthritis at the base of the left thumb.  [name] clinical examination suggests [name]  carpal tunnel syndrome.  Elective diagnostic studies confirmed carpal tunnel  syndrome.  Due to [name] failure to respond to nonoperative measures, she is  brought to the operating room at this time for [name] release of the left  transverse carpal ligament.   DESCRIPTION OF PROCEDURE:  The patient was brought to the operating room and  placed in the supine position on the operating room table.  Following the  induction of general anesthesia, the left arm was prepped with Betadine soap  and solution and sterilely draped.  Following the exsanguination of the left  arm with an Esmarch bandage, an arterial tourniquet on the proximal brachium  was inflated to 200 mmHg.   The procedure commenced with [name] short incision in the line of the ring  finger.  The subcutaneous tissues were carefully divided, revealing the  palmar fascia.  This was  split longitudinally with [name] decompression of the  median nerve.  This was followed back to the transverse carpal ligament, and  it was carefully isolated from the median nerve.  The ligament was then  released along its ulnar border extending into the distal forearm.  This  widely opened the carpal canal.  No masses or malignancy noted.   Bleeding points along the margin of the released ligament were  electrocauterized with bipolar current, followed by [name] repair of the skin  with #3-0 Prolene suture.  [name] compressive dressing was applied as well as [name]  volar plaster splint.    POSTOPERATIVE CARE:  The patient was given [name] prescription for Percocet 5 mg  1-2 tabs p.o.  q.4-6 h. p.r.n. pain, [name] total of #20 tablets, without refill.  Katy Fitch Naaman Plummer., M.D.    RVS/MEDQ  D:  08/19/2002  T:  08/19/2002  Job:  [id number]   cc:   Joycelyn Rua, M.D.  [address] [place]  [place] [address]  Fax: [phone]

## 2010-11-25 NOTE — Cardiovascular Report (Signed)
NAME:  Julia [name], Julia [name]                             ACCOUNT NO.:  [id number]   MEDICAL RECORD NO.:  [id number]                   PATIENT TYPE:  INP   LOCATION:  4728                                 FACILITY:  [facility]   PHYSICIAN:  Cristy Hilts. Jacinto Halim, M.D.                  DATE OF BIRTH:  March 29, 1919   DATE OF PROCEDURE:  01/23/2003  DATE OF DISCHARGE:                              CARDIAC CATHETERIZATION   PROCEDURE PERFORMED:  1. Left ventriculography.  2. Selective right and left coronary arteriography.  3. Right RIMA and left subclavian and LIMA visualization.  4. Abdominal aortogram.   INDICATION:  Ms. Julia [name] is an 77 year old active female who has no  significant past medical history except for questionable hyperlipidemia in  the past.  Was admitted to the hospital with chest pain.  This pain was  suggestive of angina pectoris in the form of left breast area with radiation  to the left shoulder and left back.  Given ongoing chest pain while she was  still in the hospital, on the same day as consult she was brought to the  cardiac catheterization lab to evaluate her coronary anatomy.   HEMODYNAMIC DATA:  1. The left ventricular pressures were 122/3 with end-diastolic pressure of     12 mmHg.  2. Aortic pressure 165/82 with [name] mean of 120 mmHg.  3. There was no pressure gradient across the aortic valve.   ANGIOGRAPHIC DATA:  Left ventricle:  Left ventricular systolic function was  supranormal with cavity obliteration.  Ejection fraction 70-75%.   Right coronary artery:  The right coronary artery is [name] very large caliber  vessel.  It is calcified in its proximal segment.  The conus branch gives  ipsilateral collaterals to the RCA which is [name] large caliber vessel and [name]  dominant vessel.  RCA also receives contralateral collaterals from the LAD.   Left main coronary artery:  Left main coronary artery is [name] large caliber  vessel. It has mild calcification.   Circumflex coronary artery:   Circumflex coronary artery is [name] moderate  caliber vessel.  Ostium has 40% and mid segment has 90% stenosis.  Gives  origin to small obtuse marginal 1 and continues as obtuse marginal 2.   Left anterior descending artery:  Left anterior descending artery is [name] large  caliber vessel.  It has [name] 90-95% proximal stenosis and [name] mid 80% stenosis  and [name] mid to distal 40% stenosis.  This 80% stenosis in the mid segment is  just after the origin of [name] moderate size diagonal 1 which has 50% ostial  stenosis.  The LAD gives collaterals to the RCA.   ABDOMINAL AORTOGRAM:  Abdominal aortogram revealed no evidence of abdominal  aortic aneurysm.  The renal arteries were widely patent with left renal  artery showing 30% stenosis.   LIMA and RIMA were widely patent.  IMPRESSION:  1. Supranormal left ventricular systolic function with cavity obliteration.     Ejection fraction 70-75%.  2. Three-vessel coronary artery disease including occluded proximal right     coronary artery, proximal to mid 90% stenosis of the circumflex, proximal     90-95% left anterior descending stenosis, mid 80% stenosis.  Ipsilateral     and contralateral collaterals to the right coronary artery.   RECOMMENDATIONS:  Based on the coronary anatomy, the patient will benefit  from coronary artery bypass grafting.  CVTS consultation with Dr.  Dorris Fetch has been obtained.  This will be performed as an inpatient  procedure.   TECHNIQUE OF PROCEDURE:  Under usual sterile precautions, using [name] 6 French  right femoral arterial access, [name] 6 [place] multipurpose pigtail catheter was  advanced into the ascending aorta over [name] 0.035-inch J wire.  The catheter  was then gently advanced to the left ventricle and left ventricular  pressures were monitored.  Hand contrast injection of the left ventricle was  performed both in the LAO and RAO projection.  The catheter was flushed with  saline and pulled back into the ascending aorta and pressure  gradient across  the aortic valve was monitored.  The right coronary artery was selectively  engaged and angiography was performed.  Then, the catheter was pulled out of  the body in the usual fashion and [name] 6 [place] Judkins left 4 diagnostic  catheter was advanced into the ascending aorta and left main coronary artery  was selectively engaged and angiography was performed.  Then, the catheter  was pulled out of the body and [name] 6 [place] multipurpose D2 catheter was  readvanced into the arch of the aorta and right innominate and visualization  of RIMA and left subclavian including visualization of LIMA was performed.  Abdominal aortogram was performed and the catheter was pulled out of the  body in usual fashion.  The patient tolerated the procedure well.  No  complications were noted.                                                 Cristy Hilts. Jacinto Halim, M.D.    Pilar Plate  D:  01/23/2003  T:  01/26/2003  Job:  [id number]  [facility]   cc:   [facility]

## 2010-12-19 ENCOUNTER — Ambulatory Visit (INDEPENDENT_AMBULATORY_CARE_PROVIDER_SITE_OTHER): Payer: BC Managed Care – PPO | Admitting: Internal Medicine

## 2010-12-19 ENCOUNTER — Encounter: Payer: Self-pay | Admitting: Internal Medicine

## 2010-12-19 VITALS — BP 118/60 | HR 74 | Temp 98.1°F | Resp 18

## 2010-12-19 DIAGNOSIS — S0003XA Contusion of scalp, initial encounter: Secondary | ICD-10-CM

## 2010-12-19 DIAGNOSIS — S51819A Laceration without foreign body of unspecified forearm, initial encounter: Secondary | ICD-10-CM

## 2010-12-19 DIAGNOSIS — S51809A Unspecified open wound of unspecified forearm, initial encounter: Secondary | ICD-10-CM

## 2010-12-19 MED ORDER — CEPHALEXIN 500 MG PO CAPS: 500.0000 mg | ORAL_CAPSULE | Freq: Two times a day (BID) | ORAL | Status: AC

## 2010-12-19 NOTE — Progress Notes (Signed)
Subjective:    Patient ID: Julia Black, female    DOB: January 09, 1919, 77 y.o.   MRN: [id number]  HPI Here for trauma - skin tears Precipitated by accidental fall 48h ago -  Has take 3 vicodin pills and glass of wine trying to reduce back pain (ESI 2 weeks ago not as effective as 1st ESI - cont back/leg pain) Reports using RW to her bedroom when dropped item to ground - leaned down and became woozy - fell against sink edge - Denies LOC or incontinence No HA or severe arm pain -  Did not fall to ground - stabilized self and proceeded to bed Family found pt injured/RUE bleeding that evening but no confusion - no Urg care or ER care saught Wound care ongoing by family  Also reviewed chronic med issues as well:  CHF - hosp 8/28-8/30/11 for acute d CHF symptoms -  s/p diuresis and cards eval - no aggressive cardiac eval pirsued -  symptoms improved (less SOB and periph edema) with diuresis -  no recurrent edema or SOB, no CP -  100% compliant with meds   chronic pain - left hip (s/p fx and surg) and back (spinal stenosis)  takes 2 vicodin daily - advised by ortho to get rx for same from PCP  ambulatory with cane - no falls until 48h ago, no constipation or confusion   depression - much improved compared to prior visits -  no sadness or fatigue -  tol med well - no adv se   HTN - reports compliance with ongoing medical treatment and no changes in medication dose or frequency. denies adverse side effects related to current therapy. no HA or weakness   dyslipidemia - reports compliance with ongoing medical treatment and no changes in medication dose or frequency. denies adverse side effects related to current therapy. no myalgias   Past Medical History  Diagnosis Date  . CORONARY ARTERY DISEASE 2004    s/p CABG  . SPINAL STENOSIS   . LOW BACK PAIN     chronic, follows with Nsurg for ESI  . FRACTURE, PELVIS, RIGHT 12/2009  . HIP FRACTURE, LEFT 09/2009    s/p L THA  . Atrial fibrillation       no anticoag due to fall risk/age  . ALLERGIC RHINITIS   . CHF (congestive heart failure)   . Carpal tunnel syndrome   . Hypertension   . HYPERLIPIDEMIA   . GERD   . DEPRESSION      Review of Systems  Constitutional: Negative for fever.  Eyes: Negative for visual disturbance.  Respiratory: Negative for cough and shortness of breath.   Cardiovascular: Negative for chest pain.  Genitourinary: Negative for flank pain.  Musculoskeletal: Negative for back pain.  Neurological: Negative for syncope and headaches.  Psychiatric/Behavioral: Negative for confusion.       Objective:   Physical Exam BP 118/60  Pulse 74  Temp 98.1 F (36.7 C)  Resp 18 Physical Exam  Constitutional: She is oriented to person, place, and time. She appears well-developed and well-nourished. No distress. Son at side HENT: Head: traumatic bruise over right temple/scalp, no laceration of swelling. Ears; B TMs ok, no erythema or effusion; Nose: Nose normal.  Mouth/Throat: Oropharynx is clear and moist. No oropharyngeal exudate.  Eyes: Conjunctivae and EOM are normal. Pupils are equal, round, and reactive to light. No scleral icterus.  Neck: Normal range of motion. Neck supple. No JVD present. No thyromegaly present.  Cardiovascular: Normal rate,  regular rhythm and normal heart sounds.  No murmur heard. No BLE edema. Pulmonary/Chest: Effort normal and breath sounds normal. No respiratory distress. She has no wheezes.  Musculoskeletal: RUE with elbow and writs and hand all have normal range of motion, no joint effusions. No gross deformities. Normal flexion/extension without pain. Neurological: She is alert and oriented to person, place, and time. No cranial nerve deficit. Coordination normal.  Skin: Skin with large superficial laceration 5.5 cm x 3 cm over distal upper arm (near elbow), superficial flap partially attached. No deep laceration; 2 smaller superficial laceration near wrist, extensor surface. No rash  noted. No erythema.  Psychiatric: She has a normal mood and affect. Her behavior is normal. Judgment and thought content normal.       Assessment & Plan:  Accidental fall - superficial trauma with skin laceration to RUE - eval and cleaned today - order HHRN to continue same until healed - empiric keflex - erx done Scalp hematoma - neuro exam intact - no evidence for concussion or SHD/ICH - reassurance given - pt to limit narcotic use to 2 tabs daily and no alcohol/wine with narcotic use.

## 2010-12-19 NOTE — Patient Instructions (Addendum)
It was good to see you today. Skin tears evaluated today - will arrange home health RN to help with dressing change and care until healed - 2x/day wash with warm soapy water then cover with antibiotics ointment/gauze wraps Kelfex antibiotics - Your prescription(s) have been submitted to your pharmacy. Please take as directed and contact our office if you believe you are having problem(s) with the medication(s). Please schedule followup in 2-3 months, call sooner if problems.

## 2011-01-03 DIAGNOSIS — E119 Type 2 diabetes mellitus without complications: Secondary | ICD-10-CM

## 2011-01-03 DIAGNOSIS — S51809A Unspecified open wound of unspecified forearm, initial encounter: Secondary | ICD-10-CM

## 2011-01-03 DIAGNOSIS — S1093XA Contusion of unspecified part of neck, initial encounter: Secondary | ICD-10-CM

## 2011-01-03 DIAGNOSIS — I509 Heart failure, unspecified: Secondary | ICD-10-CM

## 2011-01-03 DIAGNOSIS — S0083XA Contusion of other part of head, initial encounter: Secondary | ICD-10-CM

## 2011-01-03 DIAGNOSIS — S0003XA Contusion of scalp, initial encounter: Secondary | ICD-10-CM

## 2011-01-04 ENCOUNTER — Other Ambulatory Visit: Payer: Self-pay | Admitting: *Deleted

## 2011-01-04 MED ORDER — HYDROCODONE-ACETAMINOPHEN 5-325 MG PO TABS: 1.0000 | ORAL_TABLET | Freq: Four times a day (QID) | ORAL | Status: DC | PRN

## 2011-01-04 NOTE — Telephone Encounter (Signed)
Faxed script back to [facility] @ [phone].Marland KitchenMarland Kitchen6/27/12@1 :31pm/LMB

## 2011-02-02 ENCOUNTER — Other Ambulatory Visit: Payer: Self-pay | Admitting: *Deleted

## 2011-02-02 MED ORDER — ZOLPIDEM TARTRATE 10 MG PO TABS: 10.0000 mg | ORAL_TABLET | Freq: Every evening | ORAL | Status: DC | PRN

## 2011-02-02 MED ORDER — ALPRAZOLAM 0.25 MG PO TABS: 0.2500 mg | ORAL_TABLET | Freq: Every day | ORAL | Status: DC

## 2011-02-02 NOTE — Telephone Encounter (Signed)
Faxed script back to [place] [facility] [phone].Marland KitchenMarland Kitchen7/26/12@2 :13pm/LMB

## 2011-02-14 ENCOUNTER — Other Ambulatory Visit: Payer: Self-pay | Admitting: *Deleted

## 2011-02-14 MED ORDER — SIMVASTATIN 40 MG PO TABS: 40.0000 mg | ORAL_TABLET | Freq: Every day | ORAL | Status: DC

## 2011-02-27 ENCOUNTER — Ambulatory Visit (INDEPENDENT_AMBULATORY_CARE_PROVIDER_SITE_OTHER): Payer: BC Managed Care – PPO | Admitting: Internal Medicine

## 2011-02-27 ENCOUNTER — Encounter: Payer: Self-pay | Admitting: Internal Medicine

## 2011-02-27 DIAGNOSIS — I4891 Unspecified atrial fibrillation: Secondary | ICD-10-CM

## 2011-02-27 DIAGNOSIS — F329 Major depressive disorder, single episode, unspecified: Secondary | ICD-10-CM

## 2011-02-27 DIAGNOSIS — E785 Hyperlipidemia, unspecified: Secondary | ICD-10-CM

## 2011-02-27 DIAGNOSIS — I10 Essential (primary) hypertension: Secondary | ICD-10-CM

## 2011-02-27 NOTE — Assessment & Plan Note (Signed)
On SNRI for same - mood stable, no tearfulness or apathy The current medical regimen is effective;  continue present plan and medications.

## 2011-02-27 NOTE — Assessment & Plan Note (Signed)
BP Readings from Last 3 Encounters:  02/27/11 120/62  12/19/10 118/60  07/08/10 178/86   The current medical regimen is effective;  continue present plan and medications.

## 2011-02-27 NOTE — Progress Notes (Signed)
Subjective:    Patient ID: Julia Black, female    DOB: 1918/12/27, 77 y.o.   MRN: [id number]  HPI  Here for follow up -  reviewed chronic med issues as well:   chronic pain - left hip (s/p fx and surg) and back (spinal stenosis)  takes 2 vicodin daily - advised by ortho to get rx for same from PCP  ambulatory with cane or RW - no falls since accidental fall 12/2010, no constipation or confusion   depression - much improved compared to prior visits - on SNRI for same since 2011 no sadness or fatigue - tolerating med well - no adv side effects   HTN - reports compliance with ongoing medical treatment and no changes in medication dose or frequency. denies adverse side effects related to current therapy. no HA or weakness   dyslipidemia - on simva - reports compliance with ongoing medical treatment and no changes in medication dose or frequency. denies adverse side effects related to current therapy. no myalgias   Past Medical History  Diagnosis Date  . CORONARY ARTERY DISEASE 2004    s/p CABG  . SPINAL STENOSIS   . LOW BACK PAIN     chronic, follows with Nsurg for ESI  . FRACTURE, PELVIS, RIGHT 12/2009  . HIP FRACTURE, LEFT 09/2009    s/p L THA  . Atrial fibrillation     no anticoag due to fall risk/age  . ALLERGIC RHINITIS   . CHF (congestive heart failure)   . Carpal tunnel syndrome   . Hypertension   . HYPERLIPIDEMIA   . GERD   . DEPRESSION      Review of Systems  Eyes: Negative for visual disturbance.  Respiratory: Negative for cough and shortness of breath.   Cardiovascular: Negative for chest pain.       Objective:   Physical Exam  BP 120/62  Pulse 56  Temp(Src) 98 F (36.7 C) (Oral)  Ht 5\' 4"  (1.626 m)  Wt 120 lb (54.432 kg)  BMI 20.60 kg/m2  SpO2 91% Wt Readings from Last 3 Encounters:  02/27/11 120 lb (54.432 kg)  07/08/10 118 lb 8 oz (53.751 kg)  06/27/10 119 lb (53.978 kg)    Constitutional: She is oriented to person, place, and time. She appears  well-developed and well-nourished. No distress. Using RW for balance Eyes: Conjunctivae and EOM are normal. Pupils are equal, round, and reactive to light. No scleral icterus.  Neck: Normal range of motion. Neck supple. No JVD present. No thyromegaly present.  Cardiovascular: Normal rate, regular rhythm and normal heart sounds.  No murmur heard. No BLE edema. Pulmonary/Chest: Effort normal and breath sounds normal. No respiratory distress. She has no wheezes.  Neurological: She is alert and oriented to person, place, and time. No cranial nerve deficit. Coordination normal.  Psychiatric: She has a normal mood and affect. Her behavior is normal. Judgment and thought content normal.   Lab Results  Component Value Date   WBC 5.1 08/11/2010   HGB 13.6 08/11/2010   HCT 40.8 08/11/2010   PLT 177 08/11/2010   CHOL  Value: 170        ATP III CLASSIFICATION:  <200     mg/dL   Desirable  409-811  mg/dL   Borderline High  >=914    mg/dL   High        01/15/2955   TRIG 78 08/12/2010   HDL 58 08/12/2010   ALT 22 08/11/2010   AST 26 08/11/2010  NA 138 08/11/2010   K 3.6 08/11/2010   CL 102 08/11/2010   CREATININE 0.60 08/11/2010   BUN 12 08/11/2010   CO2 28 08/11/2010   TSH 2.312 08/11/2010   INR 0.91 08/11/2010   HGBA1C  Value: 5.4 (NOTE)                                                                       According to the ADA Clinical Practice Recommendations for 2011, when HbA1c is used as a screening test:   >=6.5%   Diagnostic of Diabetes Mellitus           (if abnormal result  is confirmed)  5.7-6.4%   Increased risk of developing Diabetes Mellitus  References:Diagnosis and Classification of Diabetes Mellitus,Diabetes Care,2011,34(Suppl 1):S62-S69 and Standards of Medical Care in         Diabetes - 2011,Diabetes Care,2011,34  (Suppl 1):S11-S61. 08/11/2010        Assessment & Plan:   See problem list. Medications and labs reviewed today. Time spent with pt today 25 minutes, greater than 50% time spent counseling patient on  hypertension, atrial fibrillation, depression and medication review. Also review of prior records

## 2011-02-27 NOTE — Patient Instructions (Signed)
It was good to see you today. Medications reviewed, no changes at this time. Please schedule followup in 6 months for cholesterol check, call sooner if problems.

## 2011-02-27 NOTE — Assessment & Plan Note (Signed)
On statin - last lipids at goal The current medical regimen is effective;  continue present plan and medications.  

## 2011-02-27 NOTE — Assessment & Plan Note (Signed)
No anticoag due to age and fall risk - follows with [facility] for same - rate controlled and euvolemic The current medical regimen is effective;  continue present plan and medications.

## 2011-03-03 ENCOUNTER — Other Ambulatory Visit: Payer: Self-pay | Admitting: *Deleted

## 2011-03-03 MED ORDER — HYDROCODONE-ACETAMINOPHEN 5-325 MG PO TABS: 1.0000 | ORAL_TABLET | Freq: Four times a day (QID) | ORAL | Status: DC | PRN

## 2011-03-03 NOTE — Telephone Encounter (Signed)
Faxed script back to [facility] @ [phone].Marland KitchenMarland Kitchen8/24/12@3 :56pm/LMB

## 2011-03-17 ENCOUNTER — Ambulatory Visit
Admission: RE | Admit: 2011-03-17 | Discharge: 2011-03-17 | Disposition: A | Discharge status: Home / Self Care | Payer: BC Managed Care – PPO | Source: Ambulatory Visit | Attending: Neurological Surgery | Admitting: Neurological Surgery

## 2011-03-17 ENCOUNTER — Other Ambulatory Visit: Payer: Self-pay | Admitting: Neurological Surgery

## 2011-03-17 DIAGNOSIS — M48061 Spinal stenosis, lumbar region without neurogenic claudication: Secondary | ICD-10-CM

## 2011-03-23 ENCOUNTER — Other Ambulatory Visit: Payer: Self-pay | Admitting: Internal Medicine

## 2011-04-04 LAB — BASIC METABOLIC PANEL
BUN: 28 — ABNORMAL HIGH
Chloride: 104
GFR calc Af Amer: >60
Potassium: 4.9

## 2011-04-04 LAB — POCT HEMOGLOBIN-HEMACUE: Hemoglobin: 13.8

## 2011-04-05 ENCOUNTER — Other Ambulatory Visit: Payer: Self-pay | Admitting: *Deleted

## 2011-04-05 MED ORDER — HYDROCODONE-ACETAMINOPHEN 5-325 MG PO TABS: 1.0000 | ORAL_TABLET | Freq: Four times a day (QID) | ORAL | Status: DC | PRN

## 2011-04-05 NOTE — Telephone Encounter (Signed)
Faxed script back to [facility] @ [phone].Marland KitchenMarland Kitchen9/26/12@#2:42pm/LMB

## 2011-04-10 LAB — BASIC METABOLIC PANEL
Calcium: 8.6
Chloride: 102
Creatinine, Ser: 0.62
Creatinine, Ser: 0.68
GFR calc Af Amer: >60
GFR calc Af Amer: >60
GFR calc non Af Amer: >60
Potassium: 3.7
Sodium: 138

## 2011-04-10 LAB — COMPREHENSIVE METABOLIC PANEL
AST: 23
BUN: 14
CO2: 24
Calcium: 8.6
Creatinine, Ser: 0.59
GFR calc Af Amer: >60
GFR calc non Af Amer: >60

## 2011-04-10 LAB — CK TOTAL AND CKMB (NOT AT ARMC)
CK, MB: 1.1
CK, MB: 1.1
Relative Index: INVALID
Relative Index: INVALID
Total CK: 28

## 2011-04-10 LAB — CBC
HCT: 41.4
Hemoglobin: 12.9
MCV: 100.3 — ABNORMAL HIGH
RBC: 3.82 — ABNORMAL LOW
RBC: 4.12
WBC: 5.8
WBC: 6.5

## 2011-04-10 LAB — FOLATE: Folate: >20

## 2011-04-10 LAB — PROTIME-INR
INR: 0.9
Prothrombin Time: 12.5

## 2011-04-10 LAB — MAGNESIUM: Magnesium: 2.1

## 2011-04-10 LAB — TROPONIN I: Troponin I: 0.01

## 2011-04-10 LAB — AMYLASE: Amylase: 86

## 2011-04-10 LAB — LIPASE, BLOOD: Lipase: 24

## 2011-04-10 LAB — TSH: TSH: 1.63

## 2011-04-10 LAB — POCT CARDIAC MARKERS: Myoglobin, poc: 67.5

## 2011-05-03 ENCOUNTER — Other Ambulatory Visit: Payer: Self-pay | Admitting: *Deleted

## 2011-05-03 MED ORDER — FENTANYL 12 MCG/HR TD PT72: 1.0000 | MEDICATED_PATCH | TRANSDERMAL | Status: DC

## 2011-05-03 NOTE — Telephone Encounter (Signed)
Pt call back she states had epidural for her back pain about a 1 and 1/2 month month ago. Epidural is wearin off. Want to know can md rx pain patch for her ongoing back pain....05/03/11@12 :03pm/LMB

## 2011-05-03 NOTE — Telephone Encounter (Signed)
rx for low dose duragesic done - ok to pick up

## 2011-05-03 NOTE — Telephone Encounter (Signed)
Left vm requesting call back. Have ? Concerning pain medication. Called pt back no answer LMOM RTC...05/03/11@11 :34am/LMB

## 2011-05-03 NOTE — Telephone Encounter (Signed)
Notified pt with md response. Left rx up front for pick-up...05/03/11@3 :28pm/LMB

## 2011-05-05 ENCOUNTER — Other Ambulatory Visit: Payer: Self-pay | Admitting: Internal Medicine

## 2011-05-05 ENCOUNTER — Other Ambulatory Visit: Payer: Self-pay | Admitting: *Deleted

## 2011-05-05 MED ORDER — HYDROCODONE-ACETAMINOPHEN 5-325 MG PO TABS: 1.0000 | ORAL_TABLET | Freq: Four times a day (QID) | ORAL | Status: DC | PRN

## 2011-05-05 NOTE — Telephone Encounter (Signed)
Faxed script back to [facility] @ [phone]...05/05/11@3 :38pm/LMB

## 2011-05-08 ENCOUNTER — Ambulatory Visit (INDEPENDENT_AMBULATORY_CARE_PROVIDER_SITE_OTHER): Payer: BC Managed Care – PPO | Admitting: Internal Medicine

## 2011-05-08 ENCOUNTER — Encounter: Payer: Self-pay | Admitting: Internal Medicine

## 2011-05-08 VITALS — BP 130/72 | HR 74 | Temp 97.6°F | Wt 122.1 lb

## 2011-05-08 DIAGNOSIS — M545 Low back pain: Secondary | ICD-10-CM

## 2011-05-08 DIAGNOSIS — M79609 Pain in unspecified limb: Secondary | ICD-10-CM

## 2011-05-08 DIAGNOSIS — N390 Urinary tract infection, site not specified: Secondary | ICD-10-CM

## 2011-05-08 DIAGNOSIS — M4800 Spinal stenosis, site unspecified: Secondary | ICD-10-CM

## 2011-05-08 LAB — POCT URINALYSIS DIPSTICK
Protein, UA: NEGATIVE
Spec Grav, UA: 1.002
Urobilinogen, UA: 0.2
pH, UA: 5

## 2011-05-08 MED ORDER — CIPROFLOXACIN HCL 250 MG PO TABS: 250.0000 mg | ORAL_TABLET | Freq: Two times a day (BID) | ORAL | Status: DC

## 2011-05-08 MED ORDER — FENTANYL 25 MCG/HR TD PT72: 1.0000 | MEDICATED_PATCH | TRANSDERMAL | Status: DC

## 2011-05-08 NOTE — Assessment & Plan Note (Signed)
nonoperable -(s/p Nsurg and ortho eval) chronic and significant back and leg pain - Has been on chronic vicodin for years- changing to fentanyl but low dose "doing nothing" Increase dose and continue titration prn -

## 2011-05-08 NOTE — Progress Notes (Signed)
  Subjective:    Patient ID: Julia Black, female    DOB: Jul 20, 1918, 77 y.o.   MRN: [id number]  HPI  complains of UTI symptoms Onset 4 days ago associated with dysuria and small volume voiding with increased frequency denies hematuria, flank pain or fever The patient has a history of same  Also fentanyl 12 ineffective pain relief - on 2nd patch (4th day)- still using 4 vicodin/day  Past Medical History  Diagnosis Date  . CORONARY ARTERY DISEASE 2004    s/p CABG  . SPINAL STENOSIS   . LOW BACK PAIN     chronic, follows with Nsurg for ESI  . FRACTURE, PELVIS, RIGHT 12/2009  . HIP FRACTURE, LEFT 09/2009    s/p L THA  . Atrial fibrillation     no anticoag due to fall risk/age  . ALLERGIC RHINITIS   . CHF (congestive heart failure)   . Carpal tunnel syndrome   . Hypertension   . HYPERLIPIDEMIA   . GERD   . DEPRESSION      Review of Systems  Respiratory: Negative for cough and shortness of breath.   Genitourinary: Positive for dysuria, frequency and hematuria.       Objective:   Physical Exam BP 130/72  Pulse 74  Temp(Src) 97.6 F (36.4 C) (Oral)  Wt 122 lb 1.9 oz (55.393 kg)  SpO2 97% Wt Readings from Last 3 Encounters:  05/08/11 122 lb 1.9 oz (55.393 kg)  02/27/11 120 lb (54.432 kg)  07/08/10 118 lb 8 oz (53.751 kg)   Constitutional: She appears well-developed and well-nourished. No distress. dtr at side Cardiovascular: Normal rate, regular rhythm and normal heart sounds.  No murmur heard. No BLE edema. Pulmonary/Chest: Effort normal and breath sounds normal. No respiratory distress. She has no wheezes.  Abdominal: Soft. Bowel sounds are normal. She exhibits no distension. There is no tenderness. no masses Psychiatric: She has a normal mood and affect. Her behavior is normal. Judgment and thought content normal.    Udip + for LE      Assessment & Plan:  UTI - cipro x 7 day (allg to sulfa and amox>diarrhea)  Chronic back pain - started on fentanyl in hopes  of reducing vicodin use - titrate up now as low dose not effective

## 2011-05-08 NOTE — Patient Instructions (Signed)
It was good to see you today. Cipro antibiotics for your bladder infection Increase dose pain patch to 25 strength - paper prescription given to you today Your antibiotics prescription(s) have been submitted to your pharmacy. Please take as directed and contact our office if you believe you are having problem(s) with the medication(s).

## 2011-05-09 ENCOUNTER — Telehealth: Payer: Self-pay | Admitting: *Deleted

## 2011-05-09 MED ORDER — ZOLPIDEM TARTRATE 10 MG PO TABS: 5.0000 mg | ORAL_TABLET | Freq: Every evening | ORAL | Status: DC | PRN

## 2011-05-09 NOTE — Telephone Encounter (Signed)
Notified daughter with md response...05/09/11@2 :47pm/LMB

## 2011-05-09 NOTE — Telephone Encounter (Signed)
I would try reduced dose - 1/2 of 10mg  tab = 5mg  qhs prn; or can use one of the current xanax pills at night as needed - call back if continued problems

## 2011-05-09 NOTE — Telephone Encounter (Signed)
Left msg on vm requesting md to rx something less strong than mom ambien. Med is too strong for her she wakes up in the middle of the night confuse, and has fallen several times....05/09/11@11 :58am/LMB

## 2011-05-16 ENCOUNTER — Other Ambulatory Visit: Payer: Self-pay | Admitting: *Deleted

## 2011-05-16 MED ORDER — CIPROFLOXACIN HCL 250 MG PO TABS: 250.0000 mg | ORAL_TABLET | Freq: Two times a day (BID) | ORAL | Status: AC

## 2011-05-16 NOTE — Telephone Encounter (Signed)
1 additional week Cipro - erx done - to call if continued problems

## 2011-05-16 NOTE — Telephone Encounter (Signed)
Notified pt with md response. Sent cipro electronically....05/16/11@1 :43pm/LMB

## 2011-05-16 NOTE — Telephone Encounter (Signed)
Pt states she think her bladder infection is not completely gone still having urine frequency with pain. ? Is she need another dose of antibiotic...05/16/11@9 :02am/LMB

## 2011-05-23 ENCOUNTER — Other Ambulatory Visit: Payer: Self-pay | Admitting: *Deleted

## 2011-05-23 MED ORDER — ZOLPIDEM TARTRATE 5 MG PO TABS: 5.0000 mg | ORAL_TABLET | Freq: Every evening | ORAL | Status: DC | PRN

## 2011-05-23 NOTE — Telephone Encounter (Signed)
We reduced the dose of Ambien at last visit. New prescription provided for 5 mg dose.

## 2011-05-23 NOTE — Telephone Encounter (Signed)
Faxed script back to [facility] @ [phone]...05/23/11@12 :00pm/LMB

## 2011-05-25 ENCOUNTER — Other Ambulatory Visit: Payer: Self-pay | Admitting: *Deleted

## 2011-05-25 MED ORDER — FENTANYL 50 MCG/HR TD PT72: 1.0000 | MEDICATED_PATCH | TRANSDERMAL | Status: DC

## 2011-05-25 NOTE — Telephone Encounter (Signed)
Notified pt md increase dosage rx ready for pick-up....05/25/11@2 :50pm/LMB

## 2011-05-25 NOTE — Telephone Encounter (Signed)
Increase dose to patch, 50 mcg. Continue to change patch every 3 days and limit her hydrocodone use with increase in patch dose. Thanks

## 2011-05-25 NOTE — Telephone Encounter (Signed)
Pt states needing refill on pain patch, but want to see will increase mg due to ongoing chronic Back and knee pain ....05/25/11@2 :09pm/LMB

## 2011-05-31 ENCOUNTER — Encounter: Payer: Self-pay | Admitting: Internal Medicine

## 2011-05-31 ENCOUNTER — Ambulatory Visit (INDEPENDENT_AMBULATORY_CARE_PROVIDER_SITE_OTHER): Payer: BC Managed Care – PPO | Admitting: Internal Medicine

## 2011-05-31 ENCOUNTER — Telehealth: Payer: Self-pay | Admitting: *Deleted

## 2011-05-31 DIAGNOSIS — M4800 Spinal stenosis, site unspecified: Secondary | ICD-10-CM

## 2011-05-31 DIAGNOSIS — I509 Heart failure, unspecified: Secondary | ICD-10-CM

## 2011-05-31 MED ORDER — LIDOCAINE 5 % EX PTCH: 1.0000 | MEDICATED_PATCH | Freq: Two times a day (BID) | CUTANEOUS | Status: DC

## 2011-05-31 NOTE — Progress Notes (Signed)
Subjective:    Patient ID: Julia Black, female    DOB: 05-Dec-1918, 77 y.o.   MRN: [id number]  HPI  complains of shortness of breath associated with weight gain Denies chest pain Has not used Lasix in many weeks: "didn't need it" No fever, sore throat or myalgias  Continued back pain, no change with increase in Duragesic patch dose Family concerned with hallucinations and fall risk, especially nocturnal  Past Medical History  Diagnosis Date  . CORONARY ARTERY DISEASE 2004    s/p CABG  . SPINAL STENOSIS   . LOW BACK PAIN     chronic, follows with Nsurg for ESI  . FRACTURE, PELVIS, RIGHT 12/2009  . HIP FRACTURE, LEFT 09/2009    s/p L THA  . Atrial fibrillation     no anticoag due to fall risk/age  . ALLERGIC RHINITIS   . CHF (congestive heart failure)   . Carpal tunnel syndrome   . Hypertension   . HYPERLIPIDEMIA   . GERD   . DEPRESSION     Review of Systems  Constitutional: Negative for fever and fatigue.  Respiratory: Negative for cough and wheezing.   Gastrointestinal: Negative for constipation.  Musculoskeletal: Positive for back pain (chronic).       Objective:   Physical Exam BP 144/72  Pulse 68  Temp(Src) 98.6 F (37 C) (Oral)  Wt 125 lb 6.4 oz (56.881 kg)  SpO2 90% Wt Readings from Last 3 Encounters:  05/31/11 125 lb 6.4 oz (56.881 kg)  05/08/11 122 lb 1.9 oz (55.393 kg)  02/27/11 120 lb (54.432 kg)   Constitutional: She appears well-developed and well-nourished. No distress.  son and dtr-in-law at side  Neck: Normal range of motion. Neck supple. No JVD present. No thyromegaly present.  Cardiovascular: Normal rate, regular rhythm and normal heart sounds.  No murmur heard. trace BLE edema L>R. Pulmonary/Chest: Effort normal and breath sounds diminished at bases. No respiratory distress. She has no wheezes.  Neurological: She is alert and oriented to person, place, and time. No cranial nerve deficit. Coordination normal.  Psychiatric: She has a normal  mood and affect. Her behavior is normal. Judgment and thought content normal.   Lab Results  Component Value Date   WBC 5.1 08/11/2010   HGB 13.6 08/11/2010   HCT 40.8 08/11/2010   PLT 177 08/11/2010   GLUCOSE 94 08/11/2010   CHOL  Value: 170        ATP III CLASSIFICATION:  <200     mg/dL   Desirable  914-782  mg/dL   Borderline High  >=956    mg/dL   High        08/10/3084   TRIG 78 08/12/2010   HDL 58 08/12/2010   LDLCALC  Value: 96        Total Cholesterol/HDL:CHD Risk Coronary Heart Disease Risk Table                     Men   Women  1/2 Average Risk   3.4   3.3  Average Risk       5.0   4.4  2 X Average Risk   9.6   7.1  3 X Average Risk  23.4   11.0        Use the calculated Patient Ratio above and the CHD Risk Table to determine the patient's CHD Risk.        ATP III CLASSIFICATION (LDL):  <100     mg/dL  Optimal  100-129  mg/dL   Near or Above                    Optimal  130-159  mg/dL   Borderline  161-096  mg/dL   High  >045     mg/dL   Very High 4/0/9811   ALT 22 08/11/2010   AST 26 08/11/2010   NA 138 08/11/2010   K 3.6 08/11/2010   CL 102 08/11/2010   CREATININE 0.60 08/11/2010   BUN 12 08/11/2010   CO2 28 08/11/2010   TSH 2.312 08/11/2010   INR 0.91 08/11/2010   HGBA1C  Value: 5.4 (NOTE)                                                                       According to the ADA Clinical Practice Recommendations for 2011, when HbA1c is used as a screening test:   >=6.5%   Diagnostic of Diabetes Mellitus           (if abnormal result  is confirmed)  5.7-6.4%   Increased risk of developing Diabetes Mellitus  References:Diagnosis and Classification of Diabetes Mellitus,Diabetes Care,2011,34(Suppl 1):S62-S69 and Standards of Medical Care in         Diabetes - 2011,Diabetes Care,2011,34  (Suppl 1):S11-S61. 08/11/2010         Assessment & Plan:  See problem list. Medications and labs reviewed today.

## 2011-05-31 NOTE — Telephone Encounter (Signed)
Left msg on vm concern about mom. Has some weight gain about 5lbs, c/o being SOB, nausea this am with some diarrhea. Called Britta Mccreedy back made appt to see md this afternoon @ 2:30 to be evaluated....05/31/11@1 :15pm/LMB

## 2011-05-31 NOTE — Assessment & Plan Note (Signed)
history of diastolic failure last exacerbation during hospitalization for hip fracture 2011 Suspect's current symptoms of weight gain and shortness of breath related to same Reminded of importance of compliance with daily diuretic - increase dose x next 72 hours and resume daily dosing there after Recheck next week here in clinic for labs and weight check, call sooner if problems

## 2011-05-31 NOTE — Assessment & Plan Note (Signed)
nonoperable -(s/p Nsurg and ortho eval) chronic and significant, severe back and leg pain - Also knee arthritis Has been on chronic vicodin for years- changed to fentanyl with ongoing titration since October 2012 Still little relief but increased risk side effects reviewed with patient and family today ESI planned first week December, add Lidoderm patch now Consider change of generic Effexor to Cymbalta if still ineffective relief

## 2011-05-31 NOTE — Patient Instructions (Signed)
It was good to see you today. Increase Lasix to twice a day with potassium pill twice a day for next 3 days, then resumed each once daily No increase in Duragesic patch dose at this time but we'll add Lidoderm patch for pain - apply to back and knees every 12 hours as needed for pain No other medication changes at this time Minimize fluid intake to 4 - 8 ounce glasses each day and reduce sodium/salt intake as much as possible Monitor your weight and call us if increase by more than 2 pounds in one day Plan followup here in next 1-2 weeks to review weight, breathing, oxygen and pain control  - call sooner if problems

## 2011-06-07 ENCOUNTER — Other Ambulatory Visit (INDEPENDENT_AMBULATORY_CARE_PROVIDER_SITE_OTHER): Payer: BC Managed Care – PPO

## 2011-06-07 ENCOUNTER — Encounter: Payer: Self-pay | Admitting: Internal Medicine

## 2011-06-07 ENCOUNTER — Ambulatory Visit (INDEPENDENT_AMBULATORY_CARE_PROVIDER_SITE_OTHER): Payer: BC Managed Care – PPO | Admitting: Internal Medicine

## 2011-06-07 DIAGNOSIS — M4800 Spinal stenosis, site unspecified: Secondary | ICD-10-CM

## 2011-06-07 DIAGNOSIS — I509 Heart failure, unspecified: Secondary | ICD-10-CM

## 2011-06-07 LAB — BASIC METABOLIC PANEL
Chloride: 98 mEq/L (ref 96–112)
Creatinine, Ser: 0.7 mg/dL (ref 0.4–1.2)

## 2011-06-07 MED ORDER — FENTANYL 50 MCG/HR TD PT72: 1.0000 | MEDICATED_PATCH | TRANSDERMAL | Status: DC

## 2011-06-07 NOTE — Patient Instructions (Signed)
It was good to see you today. Continue Lasix once daily with potassium pill once daily Continue same dose Duragesic patch with Lidoderm patch for pain - refills done today No other medication changes at this time Test(s) ordered today. Your results will be called to you after review (48-72hours after test completion). If any changes need to be made, you will be notified at that time. Continue to minimize fluid intake to 4 - 8 ounce glasses each day and reduce sodium/salt intake as much as possible- also monitor your weight and call us if increase by more than 2 pounds in one day Plan followup here in next 3-4 weeks after steroid shot to review weight, breathing, oxygen and pain control  - call sooner if problems

## 2011-06-07 NOTE — Progress Notes (Signed)
Subjective:    Patient ID: Julia Black, female    DOB: 07-04-1919, 77 y.o.   MRN: [id number]  HPI  Here for followup CHF - seen last week for same -increase Lasix/Kcl x 72h Subsequent decrease in edema and decrease in weight -improved shortness of breath  Chronic back pain,  Ongoing titration of Duragesic -ineffective relief + adverse side effects: hallucinations and fall risk, especially nocturnal. Added Lidoderm one week ago with ongoing Duragesic and Vicodin> much improved  Past Medical History  Diagnosis Date  . CORONARY ARTERY DISEASE 2004    s/p CABG  . SPINAL STENOSIS   . LOW BACK PAIN     chronic, follows with Nsurg for ESI  . FRACTURE, PELVIS, RIGHT 12/2009  . HIP FRACTURE, LEFT 09/2009    s/p L THA  . Atrial fibrillation     no anticoag due to fall risk/age  . ALLERGIC RHINITIS   . CHF (congestive heart failure)   . Carpal tunnel syndrome   . Hypertension   . HYPERLIPIDEMIA   . GERD   . DEPRESSION     Review of Systems  Constitutional: Negative for fever and fatigue.  Respiratory: Negative for cough and wheezing.   Gastrointestinal: Negative for constipation.  Musculoskeletal: Positive for back pain (chronic).       Objective:   Physical Exam  BP 130/72  Pulse 77  Temp(Src) 98.3 F (36.8 C) (Oral)  Wt 120 lb 12.8 oz (54.795 kg)  SpO2 98% Wt Readings from Last 3 Encounters:  06/07/11 120 lb 12.8 oz (54.795 kg)  05/31/11 125 lb 6.4 oz (56.881 kg)  05/08/11 122 lb 1.9 oz (55.393 kg)   Constitutional: She appears well-developed and well-nourished. No distress.  son and dtr-in-law at side  Neck: Normal range of motion. Neck supple. No JVD present. No thyromegaly present.  Cardiovascular: Normal rate, regular rhythm and normal heart sounds.  No murmur heard. no BLE edema Pulmonary/Chest: Effort normal and breath sounds diminished at bases. No respiratory distress. She has no wheezes.  Neurological: She is alert and oriented to person, place, and time. No  cranial nerve deficit. Coordination normal.  Psychiatric: She has a normal mood and affect. Her behavior is normal. Judgment and thought content normal.   Lab Results  Component Value Date   WBC 5.1 08/11/2010   HGB 13.6 08/11/2010   HCT 40.8 08/11/2010   PLT 177 08/11/2010   GLUCOSE 94 08/11/2010   CHOL  Value: 170        ATP III CLASSIFICATION:  <200     mg/dL   Desirable  469-629  mg/dL   Borderline High  >=528    mg/dL   High        10/09/3242   TRIG 78 08/12/2010   HDL 58 08/12/2010   LDLCALC  Value: 96        Total Cholesterol/HDL:CHD Risk Coronary Heart Disease Risk Table                     Men   Women  1/2 Average Risk   3.4   3.3  Average Risk       5.0   4.4  2 X Average Risk   9.6   7.1  3 X Average Risk  23.4   11.0        Use the calculated Patient Ratio above and the CHD Risk Table to determine the patient's CHD Risk.        ATP  III CLASSIFICATION (LDL):  <100     mg/dL   Optimal  161-096  mg/dL   Near or Above                    Optimal  130-159  mg/dL   Borderline  045-409  mg/dL   High  >811     mg/dL   Very High 03/10/4781   ALT 22 08/11/2010   AST 26 08/11/2010   NA 138 08/11/2010   K 3.6 08/11/2010   CL 102 08/11/2010   CREATININE 0.60 08/11/2010   BUN 12 08/11/2010   CO2 28 08/11/2010   TSH 2.312 08/11/2010   INR 0.91 08/11/2010   HGBA1C  Value: 5.4 (NOTE)                                                                       According to the ADA Clinical Practice Recommendations for 2011, when HbA1c is used as a screening test:   >=6.5%   Diagnostic of Diabetes Mellitus           (if abnormal result  is confirmed)  5.7-6.4%   Increased risk of developing Diabetes Mellitus  References:Diagnosis and Classification of Diabetes Mellitus,Diabetes Care,2011,34(Suppl 1):S62-S69 and Standards of Medical Care in         Diabetes - 2011,Diabetes Care,2011,34  (Suppl 1):S11-S61. 08/11/2010         Assessment & Plan:  See problem list. Medications and labs reviewed today.

## 2011-06-07 NOTE — Assessment & Plan Note (Signed)
nonoperable -(s/p Nsurg and ortho eval) chronic and significant, severe back and leg pain - Also knee arthritis Has been on chronic vicodin for years- changed to fentanyl with ongoing titration since October 2012 Still little relief but increased risk side effects reviewed with patient and family today ESI planned first week December, added Lidoderm patch 11/21>> much improved Continue same -Duragesic 50 refills done today Consider change of generic Effexor to Cymbalta if recurrent pain- ineffective relief

## 2011-06-07 NOTE — Assessment & Plan Note (Signed)
history of diastolic failure - exacerbation during hospitalization for hip fracture 2011 Recurrent exacerbation November 2012, now improved. Related to med noncompliance Reduction in weight and improvement in dyspnea symptoms Reduce Lasix and potassium to once daily - check labs now Continue same med compliance until he SI injection December 11 follow up week after to review volume status call sooner if problems

## 2011-06-09 ENCOUNTER — Other Ambulatory Visit: Payer: Self-pay | Admitting: Internal Medicine

## 2011-06-12 ENCOUNTER — Other Ambulatory Visit: Payer: Self-pay | Admitting: *Deleted

## 2011-06-12 NOTE — Telephone Encounter (Signed)
Faxed script back to [facility] @ [phone]...06/12/11@1 :37pm/LMB

## 2011-06-12 NOTE — Telephone Encounter (Signed)
Received fax stating pt is requesting potassium chloride 20 meq. Last filled back in 2011. Pt states md told her to take lasix and when she take med she need to take potassium with it. Pt is also taking HCTZ 12.5 mg three times a week. Should she be taking that also?...06/12/11@3 :13pm/LMB

## 2011-06-13 MED ORDER — FUROSEMIDE 40 MG PO TABS: 40.0000 mg | ORAL_TABLET | Freq: Every day | ORAL | Status: DC | PRN

## 2011-06-13 MED ORDER — POTASSIUM CHLORIDE CRYS ER 20 MEQ PO TBCR: 20.0000 meq | EXTENDED_RELEASE_TABLET | Freq: Every day | ORAL | Status: DC | PRN

## 2011-06-13 NOTE — Telephone Encounter (Signed)
Yes on potassium - erx ready to send I believe pt is taking Zaroxlyn 2.5mg  3x/week, not HCTZ - please clarify this with pharm - thanks

## 2011-06-13 NOTE — Telephone Encounter (Signed)
Notified pt spoke with pharmacist received potassium prescription, and pt taking Zaroxyln instead of HCT...06/13/11@12 :18pm/LMB

## 2011-06-20 ENCOUNTER — Telehealth: Payer: Self-pay | Admitting: *Deleted

## 2011-06-20 DIAGNOSIS — M4800 Spinal stenosis, site unspecified: Secondary | ICD-10-CM

## 2011-06-20 NOTE — Telephone Encounter (Signed)
Pt is requesting refills on her duragesic patch, also pt want md to go ahead and refill lidoderm patch also that way she can pick both up tomorrow. Lidoderm is not due until next Thursday...06/20/11@3 :15pm/LMB

## 2011-06-21 MED ORDER — FENTANYL 50 MCG/HR TD PT72: 1.0000 | MEDICATED_PATCH | TRANSDERMAL | Status: DC

## 2011-06-21 MED ORDER — LIDOCAINE 5 % EX PTCH: 1.0000 | MEDICATED_PATCH | Freq: Two times a day (BID) | CUTANEOUS | Status: DC

## 2011-06-21 NOTE — Telephone Encounter (Signed)
Ok to refill both?? 

## 2011-06-21 NOTE — Telephone Encounter (Signed)
Notified pt rx's ready for pick-up...06/21/11@10 :43am/LMB

## 2011-06-23 ENCOUNTER — Other Ambulatory Visit: Payer: Self-pay | Admitting: Internal Medicine

## 2011-06-28 ENCOUNTER — Encounter: Payer: Self-pay | Admitting: *Deleted

## 2011-06-28 ENCOUNTER — Encounter: Payer: Self-pay | Admitting: Internal Medicine

## 2011-06-28 ENCOUNTER — Other Ambulatory Visit (INDEPENDENT_AMBULATORY_CARE_PROVIDER_SITE_OTHER): Payer: BC Managed Care – PPO

## 2011-06-28 ENCOUNTER — Ambulatory Visit (INDEPENDENT_AMBULATORY_CARE_PROVIDER_SITE_OTHER): Payer: BC Managed Care – PPO | Admitting: Internal Medicine

## 2011-06-28 DIAGNOSIS — M4800 Spinal stenosis, site unspecified: Secondary | ICD-10-CM

## 2011-06-28 DIAGNOSIS — I509 Heart failure, unspecified: Secondary | ICD-10-CM

## 2011-06-28 DIAGNOSIS — H3530 Unspecified macular degeneration: Secondary | ICD-10-CM

## 2011-06-28 LAB — BASIC METABOLIC PANEL
BUN: 29 mg/dL — ABNORMAL HIGH (ref 6–23)
Chloride: 103 mEq/L (ref 96–112)
GFR: 63.21 mL/min (ref >60.00)
Potassium: 4.4 mEq/L (ref 3.5–5.1)
Sodium: 141 mEq/L (ref 135–145)

## 2011-06-28 NOTE — Patient Instructions (Signed)
It was good to see you today. Continue Lasix once daily with potassium pill once daily Continue same dose Duragesic patch with Lidoderm patch for pain -  No medication changes at this time Test(s) ordered today. Your results will be called to you after review (48-72hours after test completion). If any changes need to be made, you will be notified at that time. Continue to monitor your weight and call us if increase by more than 2 pounds in one day Plan followup here in 3 months to review weight, breathing, oxygen and pain control  - call sooner if problems

## 2011-06-28 NOTE — Assessment & Plan Note (Signed)
history of diastolic failure - exacerbation during hospitalization for hip fracture 2011 Recurrent exacerbation November 2012, now improved. Related to med noncompliance Reduction in weight and improvement in dyspnea symptoms continue Lasix and potassium once daily - check labs now

## 2011-06-28 NOTE — Progress Notes (Signed)
Subjective:    Patient ID: Julia Black, female    DOB: 27-Jan-1919, 77 y.o.   MRN: [id number]  HPI  Here for followup CHF - seen last month for same: mild exac requiring increase Lasix/Kcl x 72h-  Now on each qd for last 3 weeks Subsequent decrease in edema and decrease in weight -improved shortness of breath  Chronic back pain - spinal stenosis/nonop ESI last week Ongoing titration of Duragesic: ineffective relief + adverse side effects: hallucinations and fall risk, especially nocturnal. Added Lidoderm 05/2011 with ongoing Duragesic and Vicodin> much improved  Past Medical History  Diagnosis Date  . CORONARY ARTERY DISEASE 2004    s/p CABG  . SPINAL STENOSIS   . LOW BACK PAIN     chronic, follows with Nsurg for ESI  . FRACTURE, PELVIS, RIGHT 12/2009  . HIP FRACTURE, LEFT 09/2009    s/p L THA  . Atrial fibrillation     no anticoag due to fall risk/age  . ALLERGIC RHINITIS   . CHF (congestive heart failure)   . Carpal tunnel syndrome   . Hypertension   . HYPERLIPIDEMIA   . GERD   . DEPRESSION     Review of Systems  Constitutional: Negative for fever and fatigue.  Respiratory: Negative for cough and wheezing.   Gastrointestinal: Negative for constipation.  Musculoskeletal: Positive for back pain (chronic).       Objective:   Physical Exam  BP 132/68  Pulse 68  Temp(Src) 98.1 F (36.7 C) (Oral)  Wt 119 lb 6.4 oz (54.159 kg)  SpO2 94% Wt Readings from Last 3 Encounters:  06/28/11 119 lb 6.4 oz (54.159 kg)  06/07/11 120 lb 12.8 oz (54.795 kg)  05/31/11 125 lb 6.4 oz (56.881 kg)   Constitutional: She appears well-developed and well-nourished. No distress.  son and dtr-in-law at side  Neck: Normal range of motion. Neck supple. No JVD present. No thyromegaly present.  Cardiovascular: Normal rate, regular rhythm and normal heart sounds.  No murmur heard. no BLE edema Pulmonary/Chest: Effort normal and breath sounds diminished at bases. No respiratory distress. She  has no wheezes.  Neurological: She is alert and oriented to person, place, and time. No cranial nerve deficit. Coordination normal.  Psychiatric: She has a normal mood and affect. Her behavior is normal. Judgment and thought content normal.   Lab Results  Component Value Date   WBC 5.1 08/11/2010   HGB 13.6 08/11/2010   HCT 40.8 08/11/2010   PLT 177 08/11/2010   GLUCOSE 101* 06/07/2011   CHOL  Value: 170        ATP III CLASSIFICATION:  <200     mg/dL   Desirable  086-578  mg/dL   Borderline High  >=469    mg/dL   High        12/10/9526   TRIG 78 08/12/2010   HDL 58 08/12/2010   LDLCALC  Value: 96        Total Cholesterol/HDL:CHD Risk Coronary Heart Disease Risk Table                     Men   Women  1/2 Average Risk   3.4   3.3  Average Risk       5.0   4.4  2 X Average Risk   9.6   7.1  3 X Average Risk  23.4   11.0        Use the calculated Patient Ratio above and the CHD Risk  Table to determine the patient's CHD Risk.        ATP III CLASSIFICATION (LDL):  <100     mg/dL   Optimal  161-096  mg/dL   Near or Above                    Optimal  130-159  mg/dL   Borderline  045-409  mg/dL   High  >811     mg/dL   Very High 03/10/4781   ALT 22 08/11/2010   AST 26 08/11/2010   NA 140 06/07/2011   K 4.4 06/07/2011   CL 98 06/07/2011   CREATININE 0.7 06/07/2011   BUN 22 06/07/2011   CO2 31 06/07/2011   TSH 2.312 08/11/2010   INR 0.91 08/11/2010   HGBA1C  Value: 5.4 (NOTE)                                                                       According to the ADA Clinical Practice Recommendations for 2011, when HbA1c is used as a screening test:   >=6.5%   Diagnostic of Diabetes Mellitus           (if abnormal result  is confirmed)  5.7-6.4%   Increased risk of developing Diabetes Mellitus  References:Diagnosis and Classification of Diabetes Mellitus,Diabetes Care,2011,34(Suppl 1):S62-S69 and Standards of Medical Care in         Diabetes - 2011,Diabetes Care,2011,34  (Suppl 1):S11-S61. 08/11/2010         Assessment &  Plan:  See problem list. Medications and labs reviewed today.

## 2011-06-28 NOTE — Assessment & Plan Note (Signed)
nonoperable -(s/p Nsurg and ortho eval) chronic and significant, severe back and leg pain - Also knee arthritis Has been on chronic vicodin for years- changed to fentanyl with ongoing titration since October 2012 Still little relief but increased risk side effects reviewed with patient and family today ESI done early December 2012, added Lidoderm patch 05/31/11>> much improved Continue same -Duragesic 50 refills done today Consider change of generic Effexor to Cymbalta if recurrent pain- ineffective relief

## 2011-07-05 ENCOUNTER — Telehealth: Payer: Self-pay | Admitting: *Deleted

## 2011-07-05 MED ORDER — FENTANYL 50 MCG/HR TD PT72: 1.0000 | MEDICATED_PATCH | TRANSDERMAL | Status: DC

## 2011-07-05 NOTE — Telephone Encounter (Signed)
Please call and check with patient's pharmacy on this. It is okay to provide the refill if needed on either Lidoderm or fentanyl. Thanks

## 2011-07-05 NOTE — Telephone Encounter (Signed)
Called pharmacy spoke with Mr. Julia Black did filled fentanyl patch 06/21/11 for # 5. MD did ok refill if was due. Called pt to let her know she can pick rx up...07/05/11@3 :05pm/LMB

## 2011-07-05 NOTE — Telephone Encounter (Signed)
Pt call requesting another refill on fentanyl patch. Inform pt just had refilled on 06/21/11. Pt states she put her last one on today and needing more...07/05/11@11 :15am/LMB

## 2011-07-07 ENCOUNTER — Other Ambulatory Visit: Payer: Self-pay | Admitting: Internal Medicine

## 2011-07-12 ENCOUNTER — Other Ambulatory Visit: Payer: Self-pay | Admitting: Internal Medicine

## 2011-07-12 NOTE — Telephone Encounter (Signed)
Faxed script back to [facility]...07/12/11@2 :29pm/LMB

## 2011-07-20 ENCOUNTER — Other Ambulatory Visit: Payer: Self-pay | Admitting: *Deleted

## 2011-07-20 DIAGNOSIS — M4800 Spinal stenosis, site unspecified: Secondary | ICD-10-CM

## 2011-07-20 MED ORDER — FENTANYL 50 MCG/HR TD PT72: 1.0000 | MEDICATED_PATCH | TRANSDERMAL | Status: DC

## 2011-07-20 MED ORDER — LIDOCAINE 5 % EX PTCH: 1.0000 | MEDICATED_PATCH | Freq: Two times a day (BID) | CUTANEOUS | Status: DC

## 2011-07-20 NOTE — Telephone Encounter (Signed)
Notified pt rx's ready for pick-up.Marland KitchenMarland Kitchen1/10/13@1 :26pm/LMB

## 2011-07-20 NOTE — Telephone Encounter (Signed)
Pt states she is needing renewal on pain med & patch...07/20/11@12 :08pm/LMB

## 2011-08-07 ENCOUNTER — Other Ambulatory Visit: Payer: Self-pay | Admitting: *Deleted

## 2011-08-07 DIAGNOSIS — M4800 Spinal stenosis, site unspecified: Secondary | ICD-10-CM

## 2011-08-07 NOTE — Telephone Encounter (Signed)
Pt left msg needing refill on both pain patches.Julia KitchenMarland Kitchen1/28/13@3 :44pm/LMB

## 2011-08-08 ENCOUNTER — Ambulatory Visit (INDEPENDENT_AMBULATORY_CARE_PROVIDER_SITE_OTHER): Payer: BC Managed Care – PPO | Admitting: Internal Medicine

## 2011-08-08 ENCOUNTER — Encounter: Payer: Self-pay | Admitting: Internal Medicine

## 2011-08-08 VITALS — BP 98/52 | HR 73 | Temp 97.0°F | Wt 116.1 lb

## 2011-08-08 DIAGNOSIS — H3530 Unspecified macular degeneration: Secondary | ICD-10-CM

## 2011-08-08 DIAGNOSIS — J069 Acute upper respiratory infection, unspecified: Secondary | ICD-10-CM

## 2011-08-08 DIAGNOSIS — J0100 Acute maxillary sinusitis, unspecified: Secondary | ICD-10-CM

## 2011-08-08 MED ORDER — AZITHROMYCIN 250 MG PO TABS: ORAL_TABLET | ORAL | Status: AC

## 2011-08-08 MED ORDER — FENTANYL 50 MCG/HR TD PT72: 1.0000 | MEDICATED_PATCH | TRANSDERMAL | Status: DC

## 2011-08-08 MED ORDER — LIDOCAINE 5 % EX PTCH: 1.0000 | MEDICATED_PATCH | Freq: Two times a day (BID) | CUTANEOUS | Status: DC

## 2011-08-08 NOTE — Patient Instructions (Addendum)
It was good to see you today. Zpak antibiotics - Your prescription(s) have been submitted to your pharmacy. Please take as directed and contact our office if you believe you are having problem(s) with the medication(s). Continue Mucinex, hydration and rest Alternate between ibuprofen and tylenol for aches, pain and fever symptoms as discussed Warm salt water gargle as needed Call if symptoms worse or unimproved in next 7-10days Form signed today for [employer]

## 2011-08-08 NOTE — Telephone Encounter (Signed)
Gave pt med renewal @ her office visit...08/08/11@ 9:12am/LMB

## 2011-08-08 NOTE — Progress Notes (Signed)
  Subjective:    Patient ID: Julia Black, female    DOB: Jan 19, 1919, 78 y.o.   MRN: [id number]  HPI  complains of cough, hoarseness and sinus congestion Onset 10days ago - wax and wane symptoms  Not improved with OTC meds yet Also nausea with PND, no vomit and no diarrhea or abdominal pain  No shortness of breath or chest pain  No fever or travel  Past Medical History  Diagnosis Date  . CORONARY ARTERY DISEASE 2004    s/p CABG  . SPINAL STENOSIS   . LOW BACK PAIN     chronic, follows with Nsurg for ESI  . FRACTURE, PELVIS, RIGHT 12/2009  . HIP FRACTURE, LEFT 09/2009    s/p L THA  . Atrial fibrillation     no anticoag due to fall risk/age  . ALLERGIC RHINITIS   . CHF (congestive heart failure)   . Carpal tunnel syndrome   . Hypertension   . HYPERLIPIDEMIA   . GERD   . DEPRESSION     Review of Systems  Constitutional: Negative for fatigue and unexpected weight change.  Respiratory: Negative for choking and wheezing.   Cardiovascular: Negative for chest pain, palpitations and leg swelling.       Objective:   Physical Exam BP 98/52  Pulse 73  Temp(Src) 97 F (36.1 C) (Oral)  Wt 116 lb 1.9 oz (52.672 kg)  SpO2 91% GEN: mildly ill appearing and audible head/chest congestion HENT: NCAT, mild sinus tenderness bilaterally, nares with clear discharge, oropharynx mild erythema, no exudate Eyes: Vision grossly intact, no conjunctivitis Lungs: Clear to auscultation without rhonchi or wheeze, no increased work of breathing Cardiovascular: Regular rate and rhythm, no bilateral edema        Assessment & Plan:  Viral URI >>sinusitis/bronchitis (acute) Cough, postnasal drip related to above   Empiric antibiotics prescribed due to symptom duration greater than 7 days and age with high risk co morbid dz OTC cough suppression - mucines Symptomatic care with Tylenol or Advil, hydration and rest -  salt gargle advised as needed

## 2011-08-08 NOTE — Assessment & Plan Note (Signed)
Completed library form for pt to receive books on tape asst as requested

## 2011-08-10 ENCOUNTER — Other Ambulatory Visit: Payer: Self-pay | Admitting: *Deleted

## 2011-08-10 MED ORDER — FENTANYL 75 MCG/HR TD PT72: 1.0000 | MEDICATED_PATCH | TRANSDERMAL | Status: DC

## 2011-08-10 NOTE — Telephone Encounter (Signed)
We are at max on lidocaine patch - but can increase the med in duragesic patch - new rx for patch done to replace prior patch - change every 3 days - thanks

## 2011-08-10 NOTE — Telephone Encounter (Signed)
Notified pt son (alex) with md response. Left script up-front for pick-up.Marland KitchenMarland Kitchen1/31/13@1 :38pm/LMB

## 2011-08-10 NOTE — Telephone Encounter (Signed)
Mother is c/o of extremely increase back pain. Been a month since she had her last  Injection. Want to know is mom is at the max on the pain patches. ? If one could be increase....08/10/11@10 :16am/LMB

## 2011-08-11 ENCOUNTER — Telehealth: Payer: Self-pay | Admitting: *Deleted

## 2011-08-11 NOTE — Telephone Encounter (Signed)
Notified tp daughter Darl Pikes) with md response.... 08/11/11@1 :27pm/LMB

## 2011-08-11 NOTE — Telephone Encounter (Signed)
Left msg on vm pick up rx for pain patch on yesterday. ? If this will cause mother to have halucination since it is a higher dosage as before. Did have some halucination with the ambien once before. Family just concern... 08/11/11@12 :03pm/LMB

## 2011-08-11 NOTE — Telephone Encounter (Signed)
Noted concerns - maybe - let us know if problems occur, but we have little else to offer to help tx her pain - also follow up back specialist as ongoing - thx

## 2011-08-14 ENCOUNTER — Other Ambulatory Visit: Payer: Self-pay | Admitting: Internal Medicine

## 2011-08-17 ENCOUNTER — Telehealth: Payer: Self-pay | Admitting: *Deleted

## 2011-08-17 MED ORDER — FENTANYL 50 MCG/HR TD PT72: 1.0000 | MEDICATED_PATCH | TRANSDERMAL | Status: DC

## 2011-08-17 MED ORDER — FENTANYL 75 MCG/HR TD PT72: 1.0000 | MEDICATED_PATCH | TRANSDERMAL | Status: DC

## 2011-08-17 NOTE — Telephone Encounter (Signed)
Notified pt 50 mg patch is ready for pick-up, put 75mg  in shredder... 08/17/11@1 :10pm/LMB

## 2011-08-17 NOTE — Telephone Encounter (Signed)
Dose change as requested. Please destroy prior  75 mcg patch prescription signed earlier today. Thanks

## 2011-08-17 NOTE — Telephone Encounter (Signed)
Pt call requesting refill on duragesic patch... 08/17/11@9 :22am/LMB

## 2011-08-17 NOTE — Telephone Encounter (Signed)
ok 

## 2011-08-17 NOTE — Telephone Encounter (Signed)
Pt states she would like the 50 mg on the fentanyl patch decided not to take the 75 mg due to more side effects. Fail to mention that on the first call.... 08/17/11@11 :22am/LMB

## 2011-08-17 NOTE — Telephone Encounter (Signed)
Notified pt rx ready for pick-up... 08/17/11@10 :49am/LMB

## 2011-08-25 ENCOUNTER — Other Ambulatory Visit: Payer: Self-pay | Admitting: Internal Medicine

## 2011-08-28 ENCOUNTER — Other Ambulatory Visit: Payer: Self-pay | Admitting: Internal Medicine

## 2011-08-30 ENCOUNTER — Ambulatory Visit: Payer: BC Managed Care – PPO | Admitting: Internal Medicine

## 2011-08-30 DIAGNOSIS — Z0289 Encounter for other administrative examinations: Secondary | ICD-10-CM

## 2011-09-11 ENCOUNTER — Other Ambulatory Visit: Payer: Self-pay | Admitting: *Deleted

## 2011-09-11 DIAGNOSIS — M4800 Spinal stenosis, site unspecified: Secondary | ICD-10-CM

## 2011-09-11 MED ORDER — LIDOCAINE 5 % EX PTCH: 1.0000 | MEDICATED_PATCH | Freq: Two times a day (BID) | CUTANEOUS | Status: DC

## 2011-09-11 MED ORDER — FENTANYL 50 MCG/HR TD PT72: 1.0000 | MEDICATED_PATCH | TRANSDERMAL | Status: DC

## 2011-09-11 NOTE — Telephone Encounter (Signed)
Left msg on vm time for refill on pain patch & numbing patch.... 09/11/11@9 :57am/LMB

## 2011-09-11 NOTE — Telephone Encounter (Signed)
Notified pt rx ready for pick-up... 09/11/11@10 :41am/LMB

## 2011-09-12 ENCOUNTER — Other Ambulatory Visit: Payer: Self-pay | Admitting: *Deleted

## 2011-09-12 MED ORDER — ALPRAZOLAM 0.25 MG PO TABS: 0.2500 mg | ORAL_TABLET | Freq: Every day | ORAL | Status: DC

## 2011-09-13 ENCOUNTER — Other Ambulatory Visit: Payer: Self-pay | Admitting: Internal Medicine

## 2011-09-13 NOTE — Telephone Encounter (Signed)
Faxed script back to [facility] @ [phone]... 09/13/11@ 10:089am/LMB

## 2011-09-27 ENCOUNTER — Telehealth: Payer: Self-pay | Admitting: *Deleted

## 2011-09-27 ENCOUNTER — Ambulatory Visit (INDEPENDENT_AMBULATORY_CARE_PROVIDER_SITE_OTHER): Payer: Medicare Other | Admitting: Internal Medicine

## 2011-09-27 ENCOUNTER — Encounter: Payer: Self-pay | Admitting: Internal Medicine

## 2011-09-27 VITALS — BP 110/62 | HR 71 | Temp 98.2°F | Ht 66.0 in | Wt 117.8 lb

## 2011-09-27 DIAGNOSIS — M4800 Spinal stenosis, site unspecified: Secondary | ICD-10-CM

## 2011-09-27 DIAGNOSIS — I509 Heart failure, unspecified: Secondary | ICD-10-CM

## 2011-09-27 DIAGNOSIS — I10 Essential (primary) hypertension: Secondary | ICD-10-CM

## 2011-09-27 DIAGNOSIS — E785 Hyperlipidemia, unspecified: Secondary | ICD-10-CM

## 2011-09-27 MED ORDER — HYDROCODONE-ACETAMINOPHEN 5-325 MG PO TABS: 1.0000 | ORAL_TABLET | Freq: Four times a day (QID) | ORAL | Status: DC | PRN

## 2011-09-27 NOTE — Assessment & Plan Note (Signed)
BP Readings from Last 3 Encounters:  09/27/11 110/62  08/08/11 98/52  06/28/11 132/68   The current medical regimen is effective;  continue present plan and medications.

## 2011-09-27 NOTE — Assessment & Plan Note (Signed)
history of diastolic failure - exacerbation during hospitalization for hip fracture 2011 Last exacerbation November 2012, now improved. Related to med noncompliance at the time. Reduction in weight and improvement in dyspnea symptoms, stable at this time continue Lasix and potassium once daily -
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
## 2011-09-27 NOTE — Assessment & Plan Note (Signed)
On statin - last lipids at goal The current medical regimen is effective;  continue present plan and medications.  
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
## 2011-09-27 NOTE — Progress Notes (Signed)
Subjective:    Patient ID: Julia Black, female    DOB: Sep 10, 1918, 78 y.o.   MRN: [id number]  HPI  Here for followup - reviewed chronic medical issues:  CHF - seen 05/2011 for same: mild exac requiring increase Lasix/Kcl x 72h-  Now on each qd - Subsequent decrease in edema and decrease in weight  improved shortness of breath, no edema, chest pain or dyspnea on exertion   Chronic back pain - spinal stenosis/nonop, last ESI late 08/2011 Ongoing titration of Duragesic: ineffective relief + adverse side effects: hallucinations and fall risk, especially nocturnal. Added Lidoderm 05/2011 with ongoing Duragesic and Vicodin> much improved  Past Medical History  Diagnosis Date  . CORONARY ARTERY DISEASE 2004    s/p CABG  . SPINAL STENOSIS   . LOW BACK PAIN     chronic, follows with Nsurg for ESI  . FRACTURE, PELVIS, RIGHT 12/2009  . HIP FRACTURE, LEFT 09/2009    s/p L THA  . Atrial fibrillation     no anticoag due to fall risk/age  . ALLERGIC RHINITIS   . CHF (congestive heart failure)   . Carpal tunnel syndrome   . Hypertension   . HYPERLIPIDEMIA   . GERD   . DEPRESSION     Review of Systems  Constitutional: Negative for fever and fatigue.  Respiratory: Negative for cough and wheezing.   Gastrointestinal: Negative for constipation.  Musculoskeletal: Positive for back pain (chronic).       Objective:   Physical Exam  BP 110/62  Pulse 71  Temp(Src) 98.2 F (36.8 C) (Oral)  Ht 5\' 6"  (1.676 m)  Wt 117 lb 12.8 oz (53.434 kg)  BMI 19.01 kg/m2  SpO2 98% Wt Readings from Last 3 Encounters:  09/27/11 117 lb 12.8 oz (53.434 kg)  08/08/11 116 lb 1.9 oz (52.672 kg)  06/28/11 119 lb 6.4 oz (54.159 kg)   Constitutional: She appears well-developed and well-nourished. No distress.  son at side  Neck: Normal range of motion. Neck supple. No JVD present. No thyromegaly present.  Cardiovascular: Normal rate, regular rhythm and normal heart sounds.  No murmur heard. no BLE  edema Pulmonary/Chest: Effort normal and breath sounds diminished at bases. No respiratory distress. She has no wheezes.  Neurological: She is alert and oriented to person, place, and time. No cranial nerve deficit. Coordination normal.  Psychiatric: She has a normal mood and affect. Her behavior is normal. Judgment and thought content normal.   Lab Results  Component Value Date   WBC 5.1 08/11/2010   HGB 13.6 08/11/2010   HCT 40.8 08/11/2010   PLT 177 08/11/2010   GLUCOSE 100* 06/28/2011   CHOL  Value: 170        ATP III CLASSIFICATION:  <200     mg/dL   Desirable  562-130  mg/dL   Borderline High  >=865    mg/dL   High        01/15/4695   TRIG 78 08/12/2010   HDL 58 08/12/2010   LDLCALC  Value: 96        Total Cholesterol/HDL:CHD Risk Coronary Heart Disease Risk Table                     Men   Women  1/2 Average Risk   3.4   3.3  Average Risk       5.0   4.4  2 X Average Risk   9.6   7.1  3 X Average Risk  23.4   11.0        Use the calculated Patient Ratio above and the CHD Risk Table to determine the patient's CHD Risk.        ATP III CLASSIFICATION (LDL):  <100     mg/dL   Optimal  161-096  mg/dL   Near or Above                    Optimal  130-159  mg/dL   Borderline  045-409  mg/dL   High  >811     mg/dL   Very High 03/10/4781   ALT 22 08/11/2010   AST 26 08/11/2010   NA 141 06/28/2011   K 4.4 06/28/2011   CL 103 06/28/2011   CREATININE 0.9 06/28/2011   BUN 29* 06/28/2011   CO2 29 06/28/2011   TSH 2.312 08/11/2010   INR 0.91 08/11/2010   HGBA1C  Value: 5.4 (NOTE)                                                                       According to the ADA Clinical Practice Recommendations for 2011, when HbA1c is used as a screening test:   >=6.5%   Diagnostic of Diabetes Mellitus           (if abnormal result  is confirmed)  5.7-6.4%   Increased risk of developing Diabetes Mellitus  References:Diagnosis and Classification of Diabetes Mellitus,Diabetes Care,2011,34(Suppl 1):S62-S69 and Standards of Medical Care  in         Diabetes - 2011,Diabetes Care,2011,34  (Suppl 1):S11-S61. 08/11/2010         Assessment & Plan:  See problem list. Medications and labs reviewed today.  Time spent with pt/family today 25 minutes, greater than 50% time spent counseling patient on back pain, lost med/stoeln (1st occurrence) and medication review. Also review of interval records

## 2011-09-27 NOTE — Telephone Encounter (Signed)
Noted same thanks 
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
## 2011-09-30 ENCOUNTER — Other Ambulatory Visit: Payer: Self-pay | Admitting: Internal Medicine

## 2011-10-02 ENCOUNTER — Other Ambulatory Visit: Payer: Self-pay | Admitting: Internal Medicine

## 2011-10-09 ENCOUNTER — Other Ambulatory Visit: Payer: Self-pay | Admitting: *Deleted

## 2011-10-09 DIAGNOSIS — M4800 Spinal stenosis, site unspecified: Secondary | ICD-10-CM

## 2011-10-09 MED ORDER — FENTANYL 50 MCG/HR TD PT72: 1.0000 | MEDICATED_PATCH | TRANSDERMAL | Status: DC

## 2011-10-09 MED ORDER — LIDOCAINE 5 % EX PTCH: 1.0000 | MEDICATED_PATCH | Freq: Two times a day (BID) | CUTANEOUS | Status: DC

## 2011-10-09 NOTE — Telephone Encounter (Signed)
Left msg on vm needing refill on pain & numbing patch... 10/09/11@9 :35am/LMB

## 2011-10-09 NOTE — Telephone Encounter (Signed)
Notified pt rx's ready for pick-up... 10/09/11@2 :06pm/LMB

## 2011-10-25 ENCOUNTER — Other Ambulatory Visit: Payer: Self-pay | Admitting: Internal Medicine

## 2011-10-25 NOTE — Telephone Encounter (Signed)
Faxed script back to [facility]... 10/25/11@1 :49pm/LMB

## 2011-11-02 ENCOUNTER — Other Ambulatory Visit: Payer: Self-pay | Admitting: *Deleted

## 2011-11-02 DIAGNOSIS — M4800 Spinal stenosis, site unspecified: Secondary | ICD-10-CM

## 2011-11-02 MED ORDER — LIDOCAINE 5 % EX PTCH: 1.0000 | MEDICATED_PATCH | Freq: Two times a day (BID) | CUTANEOUS | Status: DC

## 2011-11-02 NOTE — Telephone Encounter (Signed)
Left msg on vm needing renewal on lidoderm patch...11/02/11@10 :00am/LMB

## 2011-11-02 NOTE — Telephone Encounter (Signed)
Notified pt rx ready for pick-up... 11/02/11@1 :49pm/LMB

## 2011-11-06 ENCOUNTER — Other Ambulatory Visit: Payer: Self-pay | Admitting: Internal Medicine

## 2011-11-23 ENCOUNTER — Other Ambulatory Visit: Payer: Self-pay | Admitting: *Deleted

## 2011-11-23 DIAGNOSIS — M4800 Spinal stenosis, site unspecified: Secondary | ICD-10-CM

## 2011-11-23 MED ORDER — LIDOCAINE 5 % EX PTCH: 1.0000 | MEDICATED_PATCH | Freq: Two times a day (BID) | CUTANEOUS | Status: DC

## 2011-11-23 MED ORDER — FENTANYL 50 MCG/HR TD PT72: 1.0000 | MEDICATED_PATCH | TRANSDERMAL | Status: DC

## 2011-11-23 NOTE — Telephone Encounter (Signed)
Refill on Lidoderm (numbing) and Duragesic (if needed) both ok

## 2011-11-23 NOTE — Telephone Encounter (Signed)
Left msg on vm needing refill on numbing patch... 11/23/11@9 :47am/LMB

## 2011-11-23 NOTE — Telephone Encounter (Signed)
Notified pt rx ready for pick-up... 11/23/11@3 :46pm/LMB

## 2011-12-18 ENCOUNTER — Other Ambulatory Visit: Payer: Self-pay | Admitting: Internal Medicine

## 2011-12-18 ENCOUNTER — Other Ambulatory Visit: Payer: Self-pay | Admitting: *Deleted

## 2011-12-18 MED ORDER — ZOLPIDEM TARTRATE 5 MG PO TABS: 5.0000 mg | ORAL_TABLET | Freq: Every evening | ORAL | Status: DC | PRN

## 2011-12-19 NOTE — Telephone Encounter (Signed)
F[facility]ed script back to [facility]... 12/19/11@8 :02am/LMB

## 2011-12-28 ENCOUNTER — Other Ambulatory Visit: Payer: Self-pay | Admitting: *Deleted

## 2011-12-28 MED ORDER — FENTANYL 50 MCG/HR TD PT72: 1.0000 | MEDICATED_PATCH | TRANSDERMAL | Status: DC

## 2011-12-28 MED ORDER — LIDOCAINE 5 % EX PTCH: 1.0000 | MEDICATED_PATCH | Freq: Two times a day (BID) | CUTANEOUS | Status: DC

## 2011-12-28 NOTE — Telephone Encounter (Signed)
Notified pt rx's ready for pick-up... 12/28/11@12 :50pm/LMB

## 2011-12-28 NOTE — Telephone Encounter (Signed)
Requesting renewal on fentanyl & lidoderm patch... 12/28/11@8 :42am/LMB

## 2012-01-10 ENCOUNTER — Other Ambulatory Visit: Payer: Self-pay | Admitting: Internal Medicine

## 2012-01-15 ENCOUNTER — Other Ambulatory Visit: Payer: Self-pay | Admitting: Internal Medicine

## 2012-01-25 ENCOUNTER — Telehealth: Payer: Self-pay | Admitting: *Deleted

## 2012-01-25 MED ORDER — FENTANYL 50 MCG/HR TD PT72: 1.0000 | MEDICATED_PATCH | TRANSDERMAL | Status: DC

## 2012-01-25 MED ORDER — LIDOCAINE 5 % EX PTCH: 1.0000 | MEDICATED_PATCH | Freq: Two times a day (BID) | CUTANEOUS | Status: DC

## 2012-01-25 NOTE — Telephone Encounter (Signed)
Called pt no answer [facility] rx's ready for pick-up.Marland KitchenMarland Kitchen7/18/13@10 :00am/LMB

## 2012-01-25 NOTE — Telephone Encounter (Signed)
ok 

## 2012-01-25 NOTE — Telephone Encounter (Signed)
Left pmsg on vm needing refills on fentanyl & lidoderm patches... 01/25/12@9 :16am/LMB

## 2012-01-29 ENCOUNTER — Other Ambulatory Visit: Payer: Self-pay | Admitting: Internal Medicine

## 2012-02-19 ENCOUNTER — Other Ambulatory Visit: Payer: Self-pay | Admitting: *Deleted

## 2012-02-19 MED ORDER — HYDROCODONE-ACETAMINOPHEN 5-325 MG PO TABS: 1.0000 | ORAL_TABLET | Freq: Four times a day (QID) | ORAL | Status: DC | PRN

## 2012-02-19 NOTE — Telephone Encounter (Signed)
Faxed script back to [facility]... 02/19/12@11 :54am/LMB

## 2012-02-26 ENCOUNTER — Other Ambulatory Visit: Payer: Self-pay | Admitting: Internal Medicine

## 2012-02-26 ENCOUNTER — Telehealth: Payer: Self-pay | Admitting: *Deleted

## 2012-02-26 MED ORDER — FENTANYL 50 MCG/HR TD PT72: 1.0000 | MEDICATED_PATCH | TRANSDERMAL | Status: DC

## 2012-02-26 MED ORDER — LIDOCAINE 5 % EX PTCH: 1.0000 | MEDICATED_PATCH | Freq: Two times a day (BID) | CUTANEOUS | Status: DC

## 2012-02-26 NOTE — Telephone Encounter (Signed)
Done hardcopy to lucy 

## 2012-02-26 NOTE — Telephone Encounter (Signed)
Left msg on vm needing refill on her Fentanyl & Lidocaine patch. MD out of office is this ok to refill?... 02/26/12@11 :46am/LMB

## 2012-02-26 NOTE — Telephone Encounter (Signed)
Called pt no answer [facility] rx's ready for pick-up... 02/26/12@1 :08pm/LMB

## 2012-03-04 ENCOUNTER — Other Ambulatory Visit: Payer: Self-pay | Admitting: Internal Medicine

## 2012-03-26 ENCOUNTER — Encounter: Payer: Self-pay | Admitting: Internal Medicine

## 2012-03-26 ENCOUNTER — Ambulatory Visit (INDEPENDENT_AMBULATORY_CARE_PROVIDER_SITE_OTHER): Payer: Medicare Other | Admitting: Internal Medicine

## 2012-03-26 ENCOUNTER — Other Ambulatory Visit (INDEPENDENT_AMBULATORY_CARE_PROVIDER_SITE_OTHER): Payer: Medicare Other

## 2012-03-26 VITALS — BP 102/54 | HR 54 | Temp 97.2°F | Resp 15 | Wt 115.0 lb

## 2012-03-26 DIAGNOSIS — M4800 Spinal stenosis, site unspecified: Secondary | ICD-10-CM

## 2012-03-26 DIAGNOSIS — I10 Essential (primary) hypertension: Secondary | ICD-10-CM

## 2012-03-26 DIAGNOSIS — Z79899 Other long term (current) drug therapy: Secondary | ICD-10-CM

## 2012-03-26 DIAGNOSIS — N390 Urinary tract infection, site not specified: Secondary | ICD-10-CM

## 2012-03-26 DIAGNOSIS — R3989 Other symptoms and signs involving the genitourinary system: Secondary | ICD-10-CM

## 2012-03-26 DIAGNOSIS — R339 Retention of urine, unspecified: Secondary | ICD-10-CM

## 2012-03-26 DIAGNOSIS — E785 Hyperlipidemia, unspecified: Secondary | ICD-10-CM

## 2012-03-26 DIAGNOSIS — Z23 Encounter for immunization: Secondary | ICD-10-CM

## 2012-03-26 LAB — HEPATIC FUNCTION PANEL
ALT: 27 U/L (ref 0–35)
Bilirubin, Direct: 0.1 mg/dL (ref 0.0–0.3)
Total Bilirubin: 0.7 mg/dL (ref 0.3–1.2)
Total Protein: 6.6 g/dL (ref 6.0–8.3)

## 2012-03-26 LAB — POCT URINALYSIS DIPSTICK
Blood, UA: NEGATIVE
Ketones, UA: NEGATIVE
Spec Grav, UA: 1.01
Urobilinogen, UA: 0.2

## 2012-03-26 LAB — CBC WITH DIFFERENTIAL/PLATELET
Eosinophils Relative: 3.4 % (ref 0.0–5.0)
HCT: 39.3 % (ref 36.0–46.0)
Hemoglobin: 12.7 g/dL (ref 12.0–15.0)
Lymphs Abs: 1.9 10*3/uL (ref 0.7–4.0)
MCV: 94.5 fl (ref 78.0–100.0)
Monocytes Absolute: 0.7 10*3/uL (ref 0.1–1.0)
Monocytes Relative: 6.8 % (ref 3.0–12.0)
Neutro Abs: 6.9 10*3/uL (ref 1.4–7.7)
WBC: 9.8 10*3/uL (ref 4.5–10.5)

## 2012-03-26 LAB — BASIC METABOLIC PANEL
Chloride: 94 mEq/L — ABNORMAL LOW (ref 96–112)
GFR: 65.65 mL/min (ref >60.00)
Glucose, Bld: 95 mg/dL (ref 70–99)
Potassium: 4 mEq/L (ref 3.5–5.1)
Sodium: 138 mEq/L (ref 135–145)

## 2012-03-26 LAB — LIPID PANEL
HDL: 56.5 mg/dL (ref >39.00)
LDL Cholesterol: 84 mg/dL (ref 0–99)
VLDL: 24.2 mg/dL (ref 0.0–40.0)

## 2012-03-26 MED ORDER — LIDOCAINE 5 % EX PTCH: 3.0000 | MEDICATED_PATCH | Freq: Two times a day (BID) | CUTANEOUS | Status: DC

## 2012-03-26 MED ORDER — CIPROFLOXACIN HCL 250 MG PO TABS: 250.0000 mg | ORAL_TABLET | Freq: Two times a day (BID) | ORAL | Status: DC

## 2012-03-26 MED ORDER — FENTANYL 50 MCG/HR TD PT72: 1.0000 | MEDICATED_PATCH | TRANSDERMAL | Status: DC

## 2012-03-26 NOTE — Assessment & Plan Note (Signed)
BP Readings from Last 3 Encounters:  03/26/12 102/54  09/27/11 110/62  08/08/11 98/52   The current medical regimen is effective;  continue present plan and medications.

## 2012-03-26 NOTE — Assessment & Plan Note (Signed)
nonoperable -(s/p Nsurg and ortho eval) chronic and significant, severe back and leg pain - Also knee arthritis Has been on chronic vicodin for years- added fentanyl with ongoing titration since October 2012 ESI last done 03/2012 added Lidoderm patch 05/31/11>> much improved initially, increase daily dose now Continue same -Duragesic 100 and refill hydrocodone/tramadol given today Consider change of generic Effexor to Cymbalta if recurrent pain- ineffective relief

## 2012-03-26 NOTE — Patient Instructions (Signed)
It was good to see you today. Cipro antibiotics 2x/day for 1 week for bladder infection - Increase Lidoderm to 3 patches per day as needed Other medications reviewed and updated, no additional changes Your prescription(s) have been submitted to your pharmacy. Please take as directed and contact our office if you believe you are having problem(s) with the medication(s). Test(s) ordered today. Your results will be called to you after review (48-72hours after test completion). If any changes need to be made, you will be notified at that time. Please schedule followup in 6 months, call sooner if problems.

## 2012-03-26 NOTE — Progress Notes (Signed)
HPI: complains of UTI symptoms Onset 4 days ago, progressively worse associated with dysuria and small volume voiding with increased frequency denies hematuria, flank pain or fever The patient has a history of prior UTI  Also reviewed chronic medical issues  PMH: reviewed  ROS:  Gen.: No unexpected weight change, no night sweats Lungs: No cough or shortness of breath Cardiovascular: No palpitations or chest pain  PE: BP 102/54  Pulse 54  Temp 97.2 F (36.2 C) (Oral)  Resp 15  Wt 115 lb (52.164 kg)  SpO2 96% General: No acute distress Lungs: Clear to auscultation Cardiovascular: Regular rate rhythm, no edema Abdomen: Mild to moderate discomfort of her suprapubic region, no flank tenderness to palpation  Lab Results  Component Value Date   WBC 5.1 08/11/2010   HGB 13.6 08/11/2010   HCT 40.8 08/11/2010   PLT 177 08/11/2010   GLUCOSE 100* 06/28/2011   CHOL  Value: 170        ATP III CLASSIFICATION:  <200     mg/dL   Desirable  161-096  mg/dL   Borderline High  >=045    mg/dL   High        4/0/9811   TRIG 78 08/12/2010   HDL 58 08/12/2010   LDLCALC  Value: 96        Total Cholesterol/HDL:CHD Risk Coronary Heart Disease Risk Table                     Men   Women  1/2 Average Risk   3.4   3.3  Average Risk       5.0   4.4  2 X Average Risk   9.6   7.1  3 X Average Risk  23.4   11.0        Use the calculated Patient Ratio above and the CHD Risk Table to determine the patient's CHD Risk.        ATP III CLASSIFICATION (LDL):  <100     mg/dL   Optimal  914-782  mg/dL   Near or Above                    Optimal  130-159  mg/dL   Borderline  956-213  mg/dL   High  >086     mg/dL   Very High 11/14/8467   ALT 22 08/11/2010   AST 26 08/11/2010   NA 141 06/28/2011   K 4.4 06/28/2011   CL 103 06/28/2011   CREATININE 0.9 06/28/2011   BUN 29* 06/28/2011   CO2 29 06/28/2011   TSH 2.312 08/11/2010   INR 0.91 08/11/2010   HGBA1C  Value: 5.4 (NOTE)                                                                        According to the ADA Clinical Practice Recommendations for 2011, when HbA1c is used as a screening test:   >=6.5%   Diagnostic of Diabetes Mellitus           (if abnormal result  is confirmed)  5.7-6.4%   Increased risk of developing Diabetes Mellitus  References:Diagnosis and Classification of Diabetes Mellitus,Diabetes Care,2011,34(Suppl 1):S62-S69 and Standards of Medical Care in  Diabetes - 2011,Diabetes Care,2011,34  (Suppl 1):S11-S61. 08/11/2010    Assessment/Plan: UTI, classic symptoms with history of same  Empiric antibiotic x7 days Urine culture for identification and sensitivities Hydration recommended education provided  Also see problem list

## 2012-03-26 NOTE — Assessment & Plan Note (Signed)
On statin - last lipids at goal - check annually The current medical regimen is effective;  continue present plan and medications.   

## 2012-04-01 ENCOUNTER — Other Ambulatory Visit: Payer: Self-pay | Admitting: General Practice

## 2012-04-01 MED ORDER — ZOLPIDEM TARTRATE 5 MG PO TABS: 5.0000 mg | ORAL_TABLET | Freq: Every evening | ORAL | Status: DC | PRN

## 2012-04-02 NOTE — Telephone Encounter (Signed)
Faxed on 9/24.

## 2012-04-08 ENCOUNTER — Other Ambulatory Visit: Payer: Self-pay | Admitting: *Deleted

## 2012-04-08 NOTE — Telephone Encounter (Signed)
R'cd fax from [facility] for refill of Alprazolam-last written 09/12/2011 #30 with 5 refills-please advise.

## 2012-04-08 NOTE — Telephone Encounter (Signed)
Ok to fill 

## 2012-04-09 MED ORDER — ALPRAZOLAM 0.25 MG PO TABS: 0.2500 mg | ORAL_TABLET | Freq: Every day | ORAL | Status: DC

## 2012-04-09 NOTE — Telephone Encounter (Signed)
Rx phoned into [facility] pharmacy.

## 2012-04-25 ENCOUNTER — Telehealth: Payer: Self-pay | Admitting: Internal Medicine

## 2012-04-25 DIAGNOSIS — M4800 Spinal stenosis, site unspecified: Secondary | ICD-10-CM

## 2012-04-25 MED ORDER — PREGABALIN 50 MG PO CAPS: 50.0000 mg | ORAL_CAPSULE | Freq: Three times a day (TID) | ORAL | Status: DC

## 2012-04-25 NOTE — Telephone Encounter (Signed)
Caller: [name]; Patient Name: Julia Black; PCP: Julia Black (Adults only); Best Callback Phone Number: [phone], caller states has left two messages for PCP to [facility] for Julia Black, states no response. Caller reports with history of spinal stenosis, wants to know if Lyrica is an option for her mom to use? Can this be used with all her other medications? [facility]ses [facility].  Please call (advised PCP  not in office)

## 2012-04-25 NOTE — Telephone Encounter (Signed)
yes

## 2012-04-26 NOTE — Telephone Encounter (Signed)
Pt's daughter advised and wants Rx faxed to pharmacy. Rx sent

## 2012-05-02 ENCOUNTER — Other Ambulatory Visit: Payer: Self-pay | Admitting: *Deleted

## 2012-05-02 MED ORDER — LIDOCAINE 5 % EX PTCH: 3.0000 | MEDICATED_PATCH | Freq: Two times a day (BID) | CUTANEOUS | Status: DC

## 2012-05-02 NOTE — Telephone Encounter (Signed)
Julia Black called to say the pain medicine is not working.  Her mother is in a lot of pain.  Is there some other medicine or treatments that would work better.

## 2012-05-02 NOTE — Telephone Encounter (Signed)
Pt requesting refill of Lidoderm patch-last written 03/26/2012 #90 with 0 refills-pt states son will pickup when ready.

## 2012-05-02 NOTE — Telephone Encounter (Signed)
Recommend OV to review pain and discuss options - will consider a)trying lyrica or b) refer to pain clinic to help [place] -- i will not increase her dose fentanyl or vicodin due to risk of increase confusion and/or sedation -  No tx change recommended at this time

## 2012-05-02 NOTE — Telephone Encounter (Signed)
Ok done

## 2012-05-03 ENCOUNTER — Telehealth: Payer: Self-pay | Admitting: Internal Medicine

## 2012-05-03 ENCOUNTER — Encounter: Payer: Self-pay | Admitting: Internal Medicine

## 2012-05-03 ENCOUNTER — Other Ambulatory Visit: Payer: Self-pay | Admitting: Internal Medicine

## 2012-05-03 ENCOUNTER — Ambulatory Visit (INDEPENDENT_AMBULATORY_CARE_PROVIDER_SITE_OTHER): Payer: Medicare Other | Admitting: Internal Medicine

## 2012-05-03 VITALS — BP 110/60 | HR 68 | Temp 97.5°F | Ht 66.0 in | Wt 117.8 lb

## 2012-05-03 DIAGNOSIS — M1710 Unilateral primary osteoarthritis, unspecified knee: Secondary | ICD-10-CM | POA: Insufficient documentation

## 2012-05-03 DIAGNOSIS — M4800 Spinal stenosis, site unspecified: Secondary | ICD-10-CM

## 2012-05-03 MED ORDER — PREGABALIN 25 MG PO CAPS: 25.0000 mg | ORAL_CAPSULE | Freq: Three times a day (TID) | ORAL | Status: DC

## 2012-05-03 MED ORDER — FENTANYL 50 MCG/HR TD PT72: 1.0000 | MEDICATED_PATCH | TRANSDERMAL | Status: DC

## 2012-05-03 NOTE — Telephone Encounter (Signed)
Called daughter-in law bck Britta Mccreedy) gave md response. She will make appt for Monday transferred to carolyn...[name]

## 2012-05-03 NOTE — Telephone Encounter (Signed)
The pt's daughter called and is hoping to get an rx for pain patches.  Her callback is [phone]

## 2012-05-03 NOTE — Assessment & Plan Note (Signed)
Ongoing periodic injections with orthopedic, steroid and Synvisc Continue pain management as outlined above for spinal stenosis pain

## 2012-05-03 NOTE — Telephone Encounter (Signed)
Spoke with Britta Mccreedy see previous phone note,,,/lmb

## 2012-05-03 NOTE — Progress Notes (Signed)
Subjective:    Patient ID: Julia Black, female    DOB: 06-04-1919, 78 y.o.   MRN: [id number]  Back Pain Pertinent negatives include no fever.   CHF - seen 05/2011 for same: mild exac requiring increase Lasix/Kcl x 72h-  Now on each qd - Subsequent decrease in edema and decrease in weight  improved shortness of breath, no edema, chest pain or dyspnea on exertion   Chronic back pain - spinal stenosis/nonop, last [facility] 04/2012 Previous high dose Duragesic provided ineffective relief + adverse side effects: hallucinations and fall risk, especially nocturnal. Added Lidoderm 05/2011 with ongoing Duragesic 50 and Vicodin/tramadol> initially much improved, but now severe again. Denies trauma  Osteoarthritis, knee, end-stage. Follows with orthopedics for same. Periodic steroid and Synvisc injections reviewed. No new swelling or change in nature of knee pain symptoms  Past Medical History  Diagnosis Date  . CORONARY ARTERY DISEASE 2004    s/p CABG  . SPINAL STENOSIS   . LOW BACK PAIN     chronic, follows with Nsurg for ESI  . FRACTURE, PELVIS, RIGHT 12/2009  . HIP FRACTURE, LEFT 09/2009    s/p L THA  . Atrial fibrillation     no anticoag due to fall risk/age  . ALLERGIC RHINITIS   . CHF (congestive heart failure)   . Carpal tunnel syndrome   . Hypertension   . HYPERLIPIDEMIA   . GERD   . DEPRESSION     Review of Systems  Constitutional: Negative for fever and fatigue.  Respiratory: Negative for cough and wheezing.   Gastrointestinal: Negative for constipation.  Musculoskeletal: Positive for back pain.       Objective:   Physical Exam  BP 110/60  Pulse 68  Temp 97.5 F (36.4 C) (Oral)  Ht 5\' 6"  (1.676 m)  Wt 117 lb 12.8 oz (53.434 kg)  BMI 19.01 kg/m2  SpO2 94% Wt Readings from Last 3 Encounters:  05/03/12 117 lb 12.8 oz (53.434 kg)  03/26/12 115 lb (52.164 kg)  09/27/11 117 lb 12.8 oz (53.434 kg)   Constitutional: She appears well-developed and well-nourished. No  distress.  daughter-in-law and son at side  Neck: Normal range of motion. Neck supple. No JVD present. No thyromegaly present.  Cardiovascular: Normal rate, regular rhythm and normal heart sounds.  No murmur heard. no BLE edema Pulmonary/Chest: Effort normal and breath sounds diminished at bases. No respiratory distress. She has no wheezes.  Neurological: She is alert and oriented to person, place, and time. No cranial nerve deficit. Coordination normal.  Psychiatric: She has a normal mood and affect. Her behavior is normal. Judgment and thought content normal.   Lab Results  Component Value Date   WBC 9.8 03/26/2012   HGB 12.7 03/26/2012   HCT 39.3 03/26/2012   PLT 194.0 03/26/2012   GLUCOSE 95 03/26/2012   CHOL 165 03/26/2012   TRIG 121.0 03/26/2012   HDL 56.50 03/26/2012   LDLCALC 84 03/26/2012   ALT 27 03/26/2012   AST 29 03/26/2012   NA 138 03/26/2012   K 4.0 03/26/2012   CL 94* 03/26/2012   CREATININE 0.9 03/26/2012   BUN 30* 03/26/2012   CO2 30 03/26/2012   TSH 2.312 08/11/2010   INR 0.91 08/11/2010   HGBA1C  Value: 5.4 (NOTE)  According to the ADA Clinical Practice Recommendations for 2011, when HbA1c is used as a screening test:   >=6.5%   Diagnostic of Diabetes Mellitus           (if abnormal result  is confirmed)  5.7-6.4%   Increased risk of developing Diabetes Mellitus  References:Diagnosis and Classification of Diabetes Mellitus,Diabetes Care,2011,34(Suppl 1):S62-S69 and Standards of Medical Care in         Diabetes - 2011,Diabetes Care,2011,34  (Suppl 1):S11-S61. 08/11/2010         Assessment & Plan:  See problem list. Medications and labs reviewed today.  Time spent with pt/family today 25 minutes, greater than 50% time spent counseling patient on back pain, options for medical, surgical and conservative treatment and medication review. Also review of interval records

## 2012-05-03 NOTE — Assessment & Plan Note (Signed)
nonoperable -(s/p Nsurg and ortho eval) chronic and significant, associated with severe back and leg pain - Also knee arthritis Has been on chronic vicodin for years-  added fentanyl with ongoing titration since October 2012, then backed down to patch due to adverse side effects 10/2011 added Lidoderm patch 05/31/11>> much improved initially, on max daily dose now Add low dose Lyrica and titrate carefully - note 50mg  TID caused intolerable dizziness hold hydrocodone/tramadol while titrating Lyrica unless needed for severe breakthrough pain -careful instructions reviewed with family and patient today Also consider change of generic Effexor to Cymbalta if ineffective pain relief

## 2012-05-03 NOTE — Patient Instructions (Signed)
It was good to see you today. We have reviewed your prior records including labs and tests today Will start low-dose Lyrica 25 mg take twice daily, then increase to 3 times daily if no adverse symptoms for 48 hours. After tolerating 25 mg 3 times daily without symptoms for 48 hours, will increase to 50 mg twice daily. Please call if questions Avoid using hydrocodone and tramadol while titrating Lyrica. If severe pain while titrating Lyrica, okay to use tramadol as previously scheduled, then hydrocodone if remains severe Continue lidocaine patches (max 3 per 24 hours) and fentanyl patch as ongoing Other medications reviewed and updated Refill on medication(s) as discussed today.

## 2012-05-29 ENCOUNTER — Other Ambulatory Visit: Payer: Self-pay | Admitting: *Deleted

## 2012-05-29 MED ORDER — LIDOCAINE 5 % EX PTCH: 3.0000 | MEDICATED_PATCH | Freq: Two times a day (BID) | CUTANEOUS | Status: DC

## 2012-05-29 MED ORDER — FENTANYL 50 MCG/HR TD PT72: 1.0000 | MEDICATED_PATCH | TRANSDERMAL | Status: DC

## 2012-05-29 NOTE — Telephone Encounter (Signed)
Requesting refill on fentanyl & lido derm patches...[name]

## 2012-05-29 NOTE — Telephone Encounter (Signed)
Notified pt fentanyl patch ready for pick-up...lmb

## 2012-05-31 ENCOUNTER — Other Ambulatory Visit: Payer: Self-pay | Admitting: Internal Medicine

## 2012-06-10 ENCOUNTER — Other Ambulatory Visit: Payer: Self-pay | Admitting: Internal Medicine

## 2012-07-17 ENCOUNTER — Other Ambulatory Visit: Payer: Self-pay | Admitting: Internal Medicine

## 2012-07-17 NOTE — Telephone Encounter (Signed)
[facility]ed script [facility] to [facility].../lmb 

## 2012-08-02 ENCOUNTER — Other Ambulatory Visit: Payer: Self-pay | Admitting: Internal Medicine

## 2012-08-05 ENCOUNTER — Encounter: Payer: Self-pay | Admitting: Internal Medicine

## 2012-08-05 ENCOUNTER — Ambulatory Visit (INDEPENDENT_AMBULATORY_CARE_PROVIDER_SITE_OTHER): Payer: Medicare Other | Admitting: Internal Medicine

## 2012-08-05 VITALS — BP 120/60 | HR 59 | Temp 97.4°F | Wt 117.1 lb

## 2012-08-05 DIAGNOSIS — M4800 Spinal stenosis, site unspecified: Secondary | ICD-10-CM

## 2012-08-05 DIAGNOSIS — M5380 Other specified dorsopathies, site unspecified: Secondary | ICD-10-CM

## 2012-08-05 DIAGNOSIS — M62830 Muscle spasm of back: Secondary | ICD-10-CM

## 2012-08-05 MED ORDER — TIZANIDINE HCL 4 MG PO CAPS: 4.0000 mg | ORAL_CAPSULE | Freq: Three times a day (TID) | ORAL | Status: DC | PRN

## 2012-08-05 NOTE — Assessment & Plan Note (Signed)
nonoperable -(s/p Nsurg and ortho eval) chronic and significant, associated with severe back and leg pain - but different than acute pain 07/2012 as above Has been on chronic hydrocodone prn for years-  added fentanyl with ongoing titration since October 2012, then backed down to patch due to adverse side effects 10/2011 added Lidoderm patch 05/31/11>> much improved initially, on max daily dose now Added Lyrica 04/2012 - improved! (note 50mg  TID caused intolerable dizziness) Has minimized use of hydrocodone or tramadol since being on Lyrica, Duragesic patch without change

## 2012-08-05 NOTE — Progress Notes (Signed)
  Subjective:    Patient ID: Julia Black, female    DOB: 1919/04/09, 79 y.o.   MRN: [id number]  HPI  Complains of back pain near left scapula Different than usual back pain symptoms Acute onset 36 hours ago Precipitated by pulling housecoat on over-shoulder Denies fall, trauma or injury Painful to direct palpation and certain movements Moderate relief with Norco but no change in chronic pain control regimen (Lyrica plus Duragesic) -cause increased in chronic constipation symptoms not yet relieved with single dose of MiraLAX  Review of Systems  Constitutional: Negative for fever and unexpected weight change.  Musculoskeletal: Negative for joint swelling and arthralgias.  Neurological: Negative for syncope, weakness and numbness.       Objective:   Physical Exam BP 120/60  Pulse 59  Temp 97.4 F (36.3 C) (Oral)  Wt 117 lb 1.9 oz (53.125 kg)  SpO2 90% General: Uncomfortable but no acute distress. Son at side Musculoskeletal: No pain over palpation of vertebral. Left shoulder with limited overhead use consistent with chronic present shoulder. No pain to palpation of shoulder but spasm and tenderness over medial inside scapular edge - gait and balance intact per baseline with walker Cardiovascular: Regular rate and rhythm Respiratory: Clear to auscultation, no wheeze or crackle Abdomen: Minimally distended but soft and nontender. Bowel sounds present throughout, no rebound or guarding  Lab Results  Component Value Date   WBC 9.8 03/26/2012   HGB 12.7 03/26/2012   HCT 39.3 03/26/2012   PLT 194.0 03/26/2012   GLUCOSE 95 03/26/2012   CHOL 165 03/26/2012   TRIG 121.0 03/26/2012   HDL 56.50 03/26/2012   LDLCALC 84 03/26/2012   ALT 27 03/26/2012   AST 29 03/26/2012   NA 138 03/26/2012   K 4.0 03/26/2012   CL 94* 03/26/2012   CREATININE 0.9 03/26/2012   BUN 30* 03/26/2012   CO2 30 03/26/2012   TSH 2.312 08/11/2010   INR 0.91 08/11/2010   HGBA1C  Value: 5.4 (NOTE)                                                                        According to the ADA Clinical Practice Recommendations for 2011, when HbA1c is used as a screening test:   >=6.5%   Diagnostic of Diabetes Mellitus           (if abnormal result  is confirmed)  5.7-6.4%   Increased risk of developing Diabetes Mellitus  References:Diagnosis and Classification of Diabetes Mellitus,Diabetes Care,2011,34(Suppl 1):S62-S69 and Standards of Medical Care in         Diabetes - 2011,Diabetes Care,2011,34  (Suppl 1):S11-S61. 08/11/2010        Assessment & Plan:   Muscle spasm - L scapula region - add muscle relaxers, advised to minimize narcotic use as much as possible because of coexisting constipation -patient call if symptoms worse or unimproved in next 72 hours  constipation - increase Miralax to TID -advised on red flags for which to seek further attention, reassurance provided

## 2012-08-05 NOTE — Patient Instructions (Signed)
It was good to see you today. Use Zanaflex for muscle relaxer 3x/day as needed for muscle spasm pain - Your prescription(s) have been submitted to your pharmacy. Please take as directed and contact our office if you believe you are having problem(s) with the medication(s). Miralax upto 3x/day in 8oz fluid for bowel movements - or Magnesium Citrate as discussed -

## 2012-08-06 ENCOUNTER — Other Ambulatory Visit: Payer: Self-pay | Admitting: *Deleted

## 2012-08-06 MED ORDER — TIZANIDINE HCL 4 MG PO TABS: 4.0000 mg | ORAL_TABLET | Freq: Three times a day (TID) | ORAL | Status: DC | PRN

## 2012-08-06 NOTE — Telephone Encounter (Signed)
Received fax wanting to switch Zanaflex capsule to tables due to capsules not cover under medicare. Sending rx for tablets...[name]

## 2012-08-12 ENCOUNTER — Ambulatory Visit (INDEPENDENT_AMBULATORY_CARE_PROVIDER_SITE_OTHER)
Admission: RE | Admit: 2012-08-12 | Discharge: 2012-08-12 | Disposition: A | Discharge status: Home / Self Care | Payer: Medicare Other | Source: Ambulatory Visit | Attending: Internal Medicine | Admitting: Internal Medicine

## 2012-08-12 ENCOUNTER — Other Ambulatory Visit (INDEPENDENT_AMBULATORY_CARE_PROVIDER_SITE_OTHER): Payer: Medicare Other

## 2012-08-12 ENCOUNTER — Encounter: Payer: Self-pay | Admitting: Internal Medicine

## 2012-08-12 ENCOUNTER — Ambulatory Visit (INDEPENDENT_AMBULATORY_CARE_PROVIDER_SITE_OTHER): Payer: Medicare Other | Admitting: Internal Medicine

## 2012-08-12 VITALS — BP 118/60 | HR 58 | Temp 97.6°F

## 2012-08-12 DIAGNOSIS — I5033 Acute on chronic diastolic (congestive) heart failure: Secondary | ICD-10-CM

## 2012-08-12 DIAGNOSIS — M549 Dorsalgia, unspecified: Secondary | ICD-10-CM

## 2012-08-12 DIAGNOSIS — R05 Cough: Secondary | ICD-10-CM

## 2012-08-12 DIAGNOSIS — R059 Cough, unspecified: Secondary | ICD-10-CM

## 2012-08-12 LAB — BASIC METABOLIC PANEL
CO2: 32 mEq/L (ref 19–32)
Chloride: 101 mEq/L (ref 96–112)
GFR: 50.98 mL/min — ABNORMAL LOW (ref >60.00)
Glucose, Bld: 94 mg/dL (ref 70–99)
Potassium: 4.8 mEq/L (ref 3.5–5.1)
Sodium: 141 mEq/L (ref 135–145)

## 2012-08-12 MED ORDER — FUROSEMIDE 40 MG PO TABS: 80.0000 mg | ORAL_TABLET | Freq: Every day | ORAL | Status: DC

## 2012-08-12 MED ORDER — FENTANYL 50 MCG/HR TD PT72: 1.0000 | MEDICATED_PATCH | TRANSDERMAL | Status: DC

## 2012-08-12 NOTE — Patient Instructions (Signed)
It was good to see you today. Medications reviewed and updated Test(s) ordered today -lab and xray. Your results will be released to MyChart (or called to you) after review, usually within 72hours after test completion. If any changes need to be made, you will be notified at that same time. Increase Lasix to 80mg  daily for next 5 days, then resume 40mg  daily (or as directed) Take one potassium pill for every 40 mg of Lasix each day Continue same pain medications including both pain patches as discussed -refills provided Your prescription(s) have been submitted to your pharmacy. Please take as directed and contact our office if you believe you are having problem(s) with the medication(s).

## 2012-08-12 NOTE — Progress Notes (Signed)
Subjective:    Patient ID: Julia Black, female    DOB: 06-Aug-1918, 79 y.o.   MRN: [id number]  Back Pain Pertinent negatives include no fever, numbness or weakness.  Cough Pertinent negatives include no fever.    Complains of continued back pain near left scapula Different than usual back pain symptoms Acute onset last week - Now radiated to front under L breast Reports pain precipitated by pulling housecoat on over-shoulder Denies fall, trauma or injury Painful to direct palpation and certain movements or cough Moderate relief with Norco but reports recent change in chronic pain control regimen (Lyrica bid and stopped Duragesic 1 week ago) -  Review of Systems  Constitutional: Negative for fever and unexpected weight change.  Respiratory: Positive for cough.   Musculoskeletal: Positive for back pain. Negative for joint swelling and arthralgias.  Neurological: Negative for syncope, weakness and numbness.       Objective:   Physical Exam  BP 118/60  Pulse 58  Temp 97.6 F (36.4 C) (Oral)  SpO2 91% Wt Readings from Last 3 Encounters:  08/05/12 117 lb 1.9 oz (53.125 kg)  05/03/12 117 lb 12.8 oz (53.434 kg)  03/26/12 115 lb (52.164 kg)   General: Uncomfortable but no acute distress. Family at side Musculoskeletal: No pain over palpation of vertebra. Left shoulder with limited overhead use consistent with chronic frozen shoulder. No pain to palpation of shoulder but spasm and tenderness over medial inside scapular edge - gait and balance intact per baseline with walker Cardiovascular: Regular rate and rhythm, no edema Respiratory: Clear to auscultation but diminished air movement at base, no wheeze or crackle Abdomen: Minimally distended but soft and nontender. Bowel sounds present throughout, no rebound or guarding Skin: No shingles or rash along left back, midline or anterior chest  Lab Results  Component Value Date   WBC 9.8 03/26/2012   HGB 12.7 03/26/2012   HCT 39.3  03/26/2012   PLT 194.0 03/26/2012   GLUCOSE 95 03/26/2012   CHOL 165 03/26/2012   TRIG 121.0 03/26/2012   HDL 56.50 03/26/2012   LDLCALC 84 03/26/2012   ALT 27 03/26/2012   AST 29 03/26/2012   NA 138 03/26/2012   K 4.0 03/26/2012   CL 94* 03/26/2012   CREATININE 0.9 03/26/2012   BUN 30* 03/26/2012   CO2 30 03/26/2012   TSH 2.312 08/11/2010   INR 0.91 08/11/2010   HGBA1C  Value: 5.4 (NOTE)                                                                       According to the ADA Clinical Practice Recommendations for 2011, when HbA1c is used as a screening test:   >=6.5%   Diagnostic of Diabetes Mellitus           (if abnormal result  is confirmed)  5.7-6.4%   Increased risk of developing Diabetes Mellitus  References:Diagnosis and Classification of Diabetes Mellitus,Diabetes Care,2011,34(Suppl 1):S62-S69 and Standards of Medical Care in         Diabetes - 2011,Diabetes Care,2011,34  (Suppl 1):S11-S61. 08/11/2010        Assessment & Plan:   Pain L scapula region, now radiating to L anterior chest under axilla - exac with cough or movement -  check (plain film rule out fracture or pleuritic pain cause) stop muscle relaxers due to sedation side effects advised to continue minimized narcotic use, but to continue baseline pain control with Duragesic and Lidoderm patch as ongoing  constipation - resolved  Acute on chronic diastolic heart failure. Reviewed last Myoview February 2012 with no ischemia and normal ejection fraction. Last 2-D echo in system reported and 2011 but follows annually with cardiology. Noting increase in weight gain with dry cough, suspect volume overload. Will empirically increase Lasix to 80 mg daily for next 5 days, then resume baseline 40 mg daily. Instructed to increase potassium with increase in diuretic dose. Check labs and plain film today -further dose adjustments depend on response and results -family will continue to monitor her weights and call if increase weight greater than 3  pounds in 24 hours

## 2012-08-14 ENCOUNTER — Other Ambulatory Visit: Payer: Self-pay | Admitting: *Deleted

## 2012-08-14 MED ORDER — LIDOCAINE 5 % EX PTCH: 3.0000 | MEDICATED_PATCH | Freq: Two times a day (BID) | CUTANEOUS | Status: DC

## 2012-08-14 NOTE — Telephone Encounter (Signed)
Notified pt [facility] to [facility]...Julia Black

## 2012-08-14 NOTE — Telephone Encounter (Signed)
Left msg on vm mom needing refill on her lidocaine patch...lmb

## 2012-08-26 ENCOUNTER — Other Ambulatory Visit: Payer: Self-pay | Admitting: Internal Medicine

## 2012-09-03 ENCOUNTER — Other Ambulatory Visit: Payer: Self-pay | Admitting: *Deleted

## 2012-09-03 MED ORDER — FENTANYL 50 MCG/HR TD PT72: 1.0000 | MEDICATED_PATCH | TRANSDERMAL | Status: DC

## 2012-09-03 NOTE — Telephone Encounter (Signed)
Left msg on vm needing refill on her Fentanyl patches...Julia Black

## 2012-09-03 NOTE — Telephone Encounter (Signed)
Notified pt rx ready for pick-up.../lmb 

## 2012-09-14 ENCOUNTER — Other Ambulatory Visit: Payer: Self-pay | Admitting: Internal Medicine

## 2012-09-24 ENCOUNTER — Ambulatory Visit: Payer: Medicare Other | Admitting: Internal Medicine

## 2012-09-30 ENCOUNTER — Other Ambulatory Visit: Payer: Self-pay | Admitting: *Deleted

## 2012-09-30 MED ORDER — FENTANYL 50 MCG/HR TD PT72: 1.0000 | MEDICATED_PATCH | TRANSDERMAL | Status: DC

## 2012-09-30 NOTE — Telephone Encounter (Signed)
Notified pt rx ready for pick-up.../lmb 

## 2012-09-30 NOTE — Telephone Encounter (Signed)
Left msg on vm needing refill on her fentanyl patches...Julia Black

## 2012-10-08 ENCOUNTER — Other Ambulatory Visit: Payer: Self-pay | Admitting: Internal Medicine

## 2012-10-21 ENCOUNTER — Other Ambulatory Visit: Payer: Self-pay | Admitting: *Deleted

## 2012-10-21 MED ORDER — LIDOCAINE 5 % EX PTCH: 3.0000 | MEDICATED_PATCH | Freq: Two times a day (BID) | CUTANEOUS | Status: DC

## 2012-10-21 NOTE — Telephone Encounter (Signed)
Notified pt rx sent to her pharmacy...lmb

## 2012-10-21 NOTE — Telephone Encounter (Signed)
Left msg on vm almost out of her lidocaine patches. Requesting a refill...Julia Black

## 2012-10-22 ENCOUNTER — Other Ambulatory Visit: Payer: Self-pay | Admitting: Internal Medicine

## 2012-10-22 ENCOUNTER — Ambulatory Visit: Payer: Medicare Other | Attending: Ophthalmology | Admitting: Occupational Therapy

## 2012-10-22 DIAGNOSIS — H53419 Scotoma involving central area, unspecified eye: Secondary | ICD-10-CM | POA: Insufficient documentation

## 2012-10-22 DIAGNOSIS — H3530 Unspecified macular degeneration: Secondary | ICD-10-CM | POA: Insufficient documentation

## 2012-10-22 DIAGNOSIS — IMO0001 Reserved for inherently not codable concepts without codable children: Secondary | ICD-10-CM | POA: Insufficient documentation

## 2012-10-22 NOTE — Telephone Encounter (Signed)
Faxed script bck to [facility]...Julia Black

## 2012-10-22 NOTE — Telephone Encounter (Signed)
MD has already left for this afternoon. Is this ok to refill?...lmb

## 2012-10-25 ENCOUNTER — Other Ambulatory Visit: Payer: Self-pay

## 2012-10-28 ENCOUNTER — Other Ambulatory Visit: Payer: Self-pay | Admitting: *Deleted

## 2012-10-28 ENCOUNTER — Encounter: Payer: Self-pay | Admitting: *Deleted

## 2012-10-28 NOTE — Telephone Encounter (Signed)
Left msg on vm requesting refill on her fentanyl patch...[name]

## 2012-10-29 MED ORDER — FENTANYL 50 MCG/HR TD PT72: 1.0000 | MEDICATED_PATCH | TRANSDERMAL | Status: DC

## 2012-10-29 NOTE — Telephone Encounter (Signed)
Notified pt rx ready for pick-up.../lmb 

## 2012-11-04 ENCOUNTER — Other Ambulatory Visit: Payer: Self-pay | Admitting: Internal Medicine

## 2012-11-16 ENCOUNTER — Other Ambulatory Visit: Payer: Self-pay | Admitting: Internal Medicine

## 2012-12-04 ENCOUNTER — Other Ambulatory Visit: Payer: Self-pay | Admitting: *Deleted

## 2012-12-04 MED ORDER — FENTANYL 50 MCG/HR TD PT72: 1.0000 | MEDICATED_PATCH | TRANSDERMAL | Status: DC

## 2012-12-04 MED ORDER — LIDOCAINE 5 % EX PTCH: 3.0000 | MEDICATED_PATCH | Freq: Two times a day (BID) | CUTANEOUS | Status: DC

## 2012-12-04 NOTE — Telephone Encounter (Signed)
Called pt no answer [facility] rx ready for pick-up.../lmb 

## 2012-12-04 NOTE — Telephone Encounter (Signed)
Left msg on vm needing refill on her fentanyl patch & lidocaine patches...[name]

## 2012-12-09 ENCOUNTER — Other Ambulatory Visit: Payer: Self-pay | Admitting: Internal Medicine

## 2012-12-11 ENCOUNTER — Ambulatory Visit (INDEPENDENT_AMBULATORY_CARE_PROVIDER_SITE_OTHER): Payer: Medicare Other | Admitting: Internal Medicine

## 2012-12-11 ENCOUNTER — Encounter: Payer: Self-pay | Admitting: Internal Medicine

## 2012-12-11 VITALS — BP 90/48 | HR 68 | Temp 97.9°F | Resp 16

## 2012-12-11 DIAGNOSIS — S8010XA Contusion of unspecified lower leg, initial encounter: Secondary | ICD-10-CM

## 2012-12-11 DIAGNOSIS — S81801A Unspecified open wound, right lower leg, initial encounter: Secondary | ICD-10-CM

## 2012-12-11 DIAGNOSIS — S81809A Unspecified open wound, unspecified lower leg, initial encounter: Secondary | ICD-10-CM | POA: Insufficient documentation

## 2012-12-11 DIAGNOSIS — S81009A Unspecified open wound, unspecified knee, initial encounter: Secondary | ICD-10-CM

## 2012-12-11 DIAGNOSIS — S8011XA Contusion of right lower leg, initial encounter: Secondary | ICD-10-CM

## 2012-12-11 MED ORDER — MUPIROCIN 2 % EX OINT: TOPICAL_OINTMENT | CUTANEOUS | Status: DC

## 2012-12-11 MED ORDER — TRAMADOL HCL 50 MG PO TABS: 25.0000 mg | ORAL_TABLET | Freq: Three times a day (TID) | ORAL | Status: DC | PRN

## 2012-12-11 MED ORDER — DOXYCYCLINE HYCLATE 100 MG PO TABS: 100.0000 mg | ORAL_TABLET | Freq: Two times a day (BID) | ORAL | Status: DC

## 2012-12-11 NOTE — Patient Instructions (Signed)
Please contact us if you notice collection of pus or  fever and chills, increased pain or redness, increased swelling in the area.

## 2012-12-11 NOTE — Progress Notes (Signed)
  Subjective:    Patient ID: Julia Black, female    DOB: March 31, 1919, 79 y.o.   MRN: [id number]  HPI 79 yo lady fell earlier today while ironing and injured her R leg. She noticed a very large swelling in the injured area. It was tender. She was able to walk. No LOC or head injury.   Review of Systems  Constitutional: Negative for chills and fatigue.  HENT: Negative for neck pain and tinnitus.   Respiratory: Negative for cough.   Genitourinary: Negative for flank pain.  Musculoskeletal: Positive for arthralgias. Negative for myalgias and back pain.  Skin: Positive for wound. Negative for rash.  Neurological: Negative for dizziness, facial asymmetry, weakness, light-headedness and headaches.       Objective:   Physical Exam  Constitutional: She appears well-developed and well-nourished. No distress.  HENT:  Head: Normocephalic.  Mouth/Throat: Oropharynx is clear and moist.  Skin: No erythema. No pallor.     A very large raised jelly consistency swelling over lage area of distal anterolateral shin, NT There are skin breakdowns seeping bloody fluid  Psychiatric: She has a normal mood and affect. Her behavior is normal. Judgment normal.   Procedure Note :    Procedure :   Point of care (POC) sonography examination   Indication: large purple irregular shape swelling over R distal shin   Equipment used: Sonosite M-Turbo with HFL38x/13-6 MHz transducer linear probe. The images were stored in the unit and later transferred in storage.  The patient was placed in a decubitus position.  This study revealed a hypoechoic 1-2 cm deep lesion c/w hematoma that completely dissected the epidermis    Impression: large hematoma  Procedure note: hematoma evacuation      Indication : a very large hematoma   Risks including poor healing, a need for repeat procedure, scar, infection and others as well as benefits were explained to the patient in detail. Verbal consent was obtained and  signed.    The patient was placed in a decubitus position. The area of the hematoma was prepped with povidone- iodine. Necrotic skin and tissues as well as about 200 cc of jelly-like dark clot were debrided/scooped out and removed with scalpel, scissors and forceps. We had to stop deeper debridement due to pain.   The wound was dressed with antibiotic ointment and two large Telfa pads, gause and coban wrap.  Tolerated well. Complications: None.   Wound instructions - she will need to have another procedure tomorrow.   Please contact us if you notice collection of pus or  fever and chills, increased pain or redness, increased swelling in the area.           Assessment & Plan:

## 2012-12-11 NOTE — Assessment & Plan Note (Addendum)
12/11/12 large area R  See [place], procedure This is a large area of necrosis/hematoma/ damage. Slow healing is epected She will need further debridement/care - surgical or wound clinic consult  Tomorrow.  Doxy po, Mupirocin, Tramadol prn Elevate leg DT is up to date

## 2012-12-12 ENCOUNTER — Ambulatory Visit (INDEPENDENT_AMBULATORY_CARE_PROVIDER_SITE_OTHER): Payer: Medicare Other | Admitting: Surgery

## 2012-12-12 ENCOUNTER — Encounter (INDEPENDENT_AMBULATORY_CARE_PROVIDER_SITE_OTHER): Payer: Self-pay | Admitting: Surgery

## 2012-12-12 VITALS — BP 98/58 | HR 66 | Temp 98.7°F | Resp 17 | Ht 61.0 in | Wt 114.4 lb

## 2012-12-12 DIAGNOSIS — S91009A Unspecified open wound, unspecified ankle, initial encounter: Secondary | ICD-10-CM | POA: Insufficient documentation

## 2012-12-12 DIAGNOSIS — S81009A Unspecified open wound, unspecified knee, initial encounter: Secondary | ICD-10-CM

## 2012-12-12 DIAGNOSIS — S81801A Unspecified open wound, right lower leg, initial encounter: Secondary | ICD-10-CM

## 2012-12-12 DIAGNOSIS — S81001A Unspecified open wound, right knee, initial encounter: Secondary | ICD-10-CM

## 2012-12-12 NOTE — Progress Notes (Signed)
General Surgery [facility]  Chief Complaint  Patient presents with  . New Evaluation    eval laceration rt lower leg - referral from Dr. Trinna Post Plotnikov    HISTORY: Patient is a 79 year old female referred by her primary care physician for management of a complex wound of the right lower extremity. Patient fell in her home yesterday. She struck her right lower leg against a piece of furniture causing a large evulsion of skin from the anterior portion of the lower leg.  She was seen by her primary care physician and started on topical antibiotic ointment. She was referred to our office for evaluation. She was also referred to the wound care center with a appointment scheduled in 4 days. She has been applying topical antibiotic ointment to the wound. She is accompanied by her children.  Past Medical History  Diagnosis Date  . CORONARY ARTERY DISEASE 2004    s/p CABG  . SPINAL STENOSIS   . LOW BACK PAIN     chronic, follows with Nsurg for ESI  . FRACTURE, PELVIS, RIGHT 12/2009  . HIP FRACTURE, LEFT 09/2009    s/p L THA  . Atrial fibrillation     no anticoag due to fall risk/age  . ALLERGIC RHINITIS   . CHF (congestive heart failure)   . Carpal tunnel syndrome   . Hypertension   . HYPERLIPIDEMIA   . GERD   . DEPRESSION   . Arthritis   . Osteoporosis      Current Outpatient Prescriptions  Medication Sig Dispense Refill  . ALPRAZolam (XANAX) 0.25 MG tablet TAKE ONE TABLET BY MOUTH ONCE DAILY  30 tablet  0  . Ascorbic Acid (VITAMIN C) 500 MG tablet Take 500 mg by mouth daily.        [name] aspirin 81 MG tablet Take 81 mg by mouth daily.        . Calcium Carbonate-Vitamin D 600-400 MG-UNIT per tablet Take 1 tablet by mouth daily.        . carvedilol (COREG) 25 MG tablet TAKE 1/2 TABLET TWICE DAILY  30 tablet  5  . chlorhexidine (PERIDEX) 0.12 % solution       . Cholecalciferol (VITAMIN D3) 1000 UNITS CAPS Take by mouth daily.        [name] doxycycline (VIBRA-TABS) 100 MG  tablet Take 1 tablet (100 mg total) by mouth 2 (two) times daily.  20 tablet  0  . fentaNYL (DURAGESIC) 50 MCG/HR Place 1 patch (50 mcg total) onto the skin every 3 (three) days.  10 patch  0  . furosemide (LASIX) 40 MG tablet Take 2 tablets (80 mg total) by mouth daily. X 5 days, then resume 40 mg daily (or as directed)  60 tablet  5  . hydrochlorothiazide (MICROZIDE) 12.5 MG capsule TAKE ONE CAPSULE ON MONDAY, WEDNESDAY,  AND FRIDAY  15 capsule  5  . lidocaine (LIDODERM) 5 % Place 3 patches onto the skin every 12 (twelve) hours. Remove & Discard patch within 12 hours or as directed by MD  90 patch  3  . LYRICA 50 MG capsule TAKE ONE CAPSULE THREE TIMES DAILY  90 capsule  5  . Multiple Vitamins-Minerals ([employer] SPECTRAVITE PO) Take by mouth daily.        . mupirocin ointment (BACTROBAN) 2 % Use qd w/dressing change  60 g  0  . nitroGLYCERIN (NITROSTAT) 0.6 MG SL tablet Place 0.6 mg under the tongue every 5 (five) minutes as needed.        [name]  Omega-3 Fatty Acids (FISH OIL) 1000 MG CAPS Take by mouth daily.        [name] omeprazole (PRILOSEC) 20 MG capsule TAKE ONE CAPSULE EACH DAY  30 capsule  5  . polyethylene glycol (MIRALAX / GLYCOLAX) packet Take 17 g by mouth daily.        . potassium chloride SA (K-DUR,KLOR-CON) 20 MEQ tablet TAKE ONE TABLET DAILY AS NEEDED  30 tablet  2  . pregabalin (LYRICA) 25 MG capsule Take 1 capsule (25 mg total) by mouth 3 (three) times daily.  90 capsule  3  . quinapril (ACCUPRIL) 10 MG tablet TAKE 1/2 TABLET TWICE DAILY  30 tablet  5  . simvastatin (ZOCOR) 40 MG tablet TAKE ONE TABLET AT BEDTIME  30 tablet  2  . traMADol (ULTRAM) 50 MG tablet Take 0.5-1 tablets (25-50 mg total) by mouth every 8 (eight) hours as needed for pain.  90 tablet  1  . venlafaxine XR (EFFEXOR-XR) 75 MG 24 hr capsule TAKE ONE CAPSULE EACH DAY  30 capsule  2  . zolpidem (AMBIEN) 5 MG tablet TAKE ONE TABLET AT BEDTIME AS NEEDED  30 tablet  5   No current facility-administered medications for this  visit.     Allergies  Allergen Reactions  . Amoxicillin     Upset stomach with high dose  . Sulfa Antibiotics     ?     Family History  Problem Relation Age of Onset  . Arthritis Mother   . Breast cancer Mother   . Heart disease Mother   . Hypertension Mother   . Arthritis Father   . Heart disease Father   . Hypertension Father   . Arthritis Maternal Grandmother   . Arthritis Maternal Grandfather   . Arthritis Paternal Grandmother   . Arthritis Paternal Grandfather   . Breast cancer Daughter      History   Social History  . Marital Status: Divorced    Spouse Name: N/A    Number of Children: N/A  . Years of Education: N/A   Social History Main Topics  . Smoking status: Former Smoker    Quit date: 07/10/1978  . Smokeless tobacco: Never Used     Comment: Lives alone in indep living facility @ [place]. Active ADLS but does not drive dur to macular degen. supportive family members nearby  . Alcohol Use: No  . Drug Use: No  . Sexually Active: None   Other Topics Concern  . None   Social History Narrative  . None     REVIEW OF SYSTEMS - PERTINENT POSITIVES ONLY: Acute wound sustained yesterday. No other complaints.  EXAM: Filed Vitals:   12/12/12 1545  BP: 98/58  Pulse: 66  Temp: 98.7 F (37.1 C)  Resp: 17    HEENT: normocephalic; pupils equal and reactive; sclerae clear; dentition good; mucous membranes moist; healing surgical wound lateral to the left eye NECK:  symmetric on extension; no palpable anterior or posterior cervical lymphadenopathy; no supraclavicular masses; no tenderness CHEST: clear to auscultation bilaterally without rales, rhonchi, or wheezes CARDIAC: regular rate and rhythm without significant murmur; peripheral pulses are full ABDOMEN: soft without distension; bowel sounds present; no mass; no hepatosplenomegaly; no hernia EXT:  Acute wound right lower leg over anterior tibial surface measuring approximately 15 cm x 6 cm in  greatest dimension; skin has been evulsed and there is a moderate amount of hematoma; skin edges were sharply debrided and hematoma is evacuated; wound is dressed with 4 x  4 gauze wet to dry with normal saline NEURO: no gross focal deficits; no sign of tremor   LABORATORY RESULTS: See [facility] (CHL-Epic) for most recent results   RADIOLOGY RESULTS: See [facility] (CHL-Epic) for most recent results   IMPRESSION: Complex laceration right lower extremity with you full shin of skin, 15 x 7 cm  PLAN: I discussed wound care with the patient and her family. At this point wet to dry dressing changes with normal saline we'll help cleanse the wound and remove the remaining hematoma and debris. I think care at the wound care center is an excellent idea and that family has an appointment made in 4 days. In the interim we will ask for home health to assist with wet to dry dressing changes in order to cleanse the wound prior to her of IUA should at the wound care center. I have asked her to keep the leg elevated when she is not ambulatory. She had her family understand.  Patient will return for surgical care as needed.  Velora Heckler, MD, FACS General & Endocrine Surgery [facility]   Visit Diagnoses: 1. Open wound of right lower extremity   2. Open wound of knee, leg (except thigh), and ankle, complicated, right, initial encounter     Primary Care Physician: Rene Paci, MD

## 2012-12-12 NOTE — Patient Instructions (Signed)
Wet to dry dressing changes with normal saline and gauze bandages twice-daily.  Elevate right leg on one pillow when not ambulatory.  Keep scheduled appointment on Monday at wound care center.  Arrangements made for home health to begin on Friday, 12/13/2012.  Velora Heckler, MD, [facility] Office: [phone]

## 2012-12-13 ENCOUNTER — Telehealth (INDEPENDENT_AMBULATORY_CARE_PROVIDER_SITE_OTHER): Payer: Self-pay | Admitting: General Surgery

## 2012-12-13 NOTE — Telephone Encounter (Signed)
Called to check on wound care referral. Spoke with [facility]. They have the patient in the system and will be out to do dressing change today.

## 2012-12-13 NOTE — Telephone Encounter (Signed)
Darl Pikes, patient's daughter called to let us know they have not heard from [facility]. Made her aware I confirmed with them that they will be out today. She will call back around 3:00 pm if she does not hear from them.

## 2012-12-13 NOTE — Telephone Encounter (Signed)
I spoke with Beth at [facility]. I explained the importance of wound care being delivered today as ordered and excepted. Wound depth and size explained to nurse and that wound care needs to be delivered today. She states she will contact pt and have nurse go out this evening.

## 2012-12-16 ENCOUNTER — Encounter (HOSPITAL_BASED_OUTPATIENT_CLINIC_OR_DEPARTMENT_OTHER): Payer: Medicare Other | Attending: Plastic Surgery

## 2012-12-16 DIAGNOSIS — L97809 Non-pressure chronic ulcer of other part of unspecified lower leg with unspecified severity: Secondary | ICD-10-CM | POA: Insufficient documentation

## 2012-12-17 NOTE — Progress Notes (Signed)
[facility]  NAME:  Julia Black, Julia Black                   ACCOUNT NO.:  [id number]  MEDICAL RECORD NO.:  [id number]      DATE OF BIRTH:  10-Jul-1919  PHYSICIAN:  Wayland Denis, DO       VISIT DATE:  12/16/2012                                  OFFICE VISIT   HISTORY OF PRESENT ILLNESS:  The patient is a 79 year old female who injured her right anterior shin while ironing.  She had a large hematoma and was seen in the office.  She has been doing wet-to-dry dressings with Vaseline as well on the area with some improvement, but it is still very large and painful.  There is no sign of infection.  PAST MEDICAL HISTORY:  Positive for coronary artery disease, hypertension, arthritis, macular degeneration, and hypercholesterolemia.  SURGERIES:  CABG, hip replacement, bilateral cataract removal, appendectomy, varicose vein stripping.  MEDICATIONS:  Coreg, omeprazole, simvastatin, Colace, venlafaxine, vitamin C, aspirin, fish oil, Ambien, multivitamin, vitamin D, MiraLAX, furosemide, potassium, hydrochlorothiazide.  ALLERGIES:  AMOXICILLIN, SULFA, and SPORANOX.  REVIEW OF SYSTEMS:  Otherwise negative.  PHYSICAL EXAMINATION:  GENERAL:  On exam, she is alert, oriented, cooperative, not in any acute distress.  She is pleasant. HEENT:  Pupils are equal.  Extraocular muscles are intact. NECK:  No cervical lymphadenopathy. CHEST:  Her breathing is unlabored. ABDOMEN:  Soft and nontender.  No organomegaly palpated but her abdomen is large.  I did not appreciate full palpation of her organs.  The ulcer is superficial, but includes an area of 12.6 x 9.9 x 0.1.  It does not appear to be infected.  Has a good granulating base.  We will apply for Oasis, and in the meantime, we will do Silvercel and wrap and see her back in a week.     Wayland Denis, DO     CS/MEDQ  D:  12/16/2012  T:  12/17/2012  Job:  [id number]

## 2012-12-18 ENCOUNTER — Telehealth: Payer: Self-pay | Admitting: Internal Medicine

## 2012-12-18 NOTE — Telephone Encounter (Signed)
OK to d/c Doxy Take Imodium AD prn Call if issues. Feel better! Thx

## 2012-12-18 NOTE — Telephone Encounter (Signed)
Pt's daughter Darl Pikes) called stated that pt was here 12/11/12 due to the fall. Pt was put on doxycycline and now pt is having diarrhea. Pt's daughter would like to know if they need to stop this med or what would Dr. Macario Golds like to do. Please advise

## 2012-12-19 ENCOUNTER — Telehealth (INDEPENDENT_AMBULATORY_CARE_PROVIDER_SITE_OTHER): Payer: Self-pay

## 2012-12-19 NOTE — Telephone Encounter (Signed)
Message copied by Joanette Gula on Thu Dec 19, 2012  9:42 AM ------      Message from: Velora Heckler      Created: Thu Dec 19, 2012  8:43 AM      Regarding: RE: Dr. Gerrit Friends       I thought she was going to the Wound Center for care?  Did she go?            tmg                  ----- Message -----         From: Joanette Gula, LPN         Sent: 12/19/2012   8:38 AM           To: Velora Heckler, MD      Subject: FW: Dr. Gerrit Friends                                                       ----- Message -----         From: Brigitt Heger         Sent: 12/18/2012   1:12 PM           To: Joanette Gula, LPN      Subject: Dr. Stann Mainland Shull/[facility] [phone]) called stating that this patient fell, and hurt her leg.  They put non-removable dressing on her leg.  Can they still see her, to monitor her, and make sure she doesn't get any infection.  Thx       ------

## 2012-12-19 NOTE — Telephone Encounter (Signed)
LMOM for Julia Black at [facility]. Per Dr Gerrit Friends pt was to see wound care center on Monday. If she is being followed by them then this question will need to be forwarded to Wound care center to be addressed. I asked her to call back with this info.

## 2012-12-19 NOTE — Telephone Encounter (Signed)
Pt's daughter informed of below.  

## 2012-12-19 NOTE — Telephone Encounter (Signed)
Received note from [facility] documenting pt was seen 12-16-12 by Dr Kelly Splinter and has a follow up appt with her 12-23-12.

## 2012-12-20 ENCOUNTER — Telehealth: Payer: Self-pay

## 2012-12-20 NOTE — Telephone Encounter (Signed)
Cindy with [facility] called stating that pt fell last week and seen/treated by Dr. Posey Rea for hematoma. Nurse went out for a home visit today and wanted to inform that BP was 92/50 and Pulse 54 (which is regular). She has been eating and drinking well but just a little weak from the innjury. Pt was told previously to stop taking ASA due to the bleeding after the fall. Nurse would like to know if pt is to continue ASA? Thanks

## 2012-12-22 NOTE — Telephone Encounter (Signed)
Reduce  Coreg to 1/2 tab bid. Hope she is getting better Thx

## 2012-12-23 NOTE — Progress Notes (Signed)
[facility]  NAME:  Julia Black, Julia Black                   ACCOUNT NO.:  [id number]  MEDICAL RECORD NO.:  [id number]      DATE OF BIRTH:  1918-09-30  PHYSICIAN:  Wayland Denis, DO       VISIT DATE:  12/23/2012                                  OFFICE VISIT   The patient is a 79 year old female who is here for followup on her right lower extremity traumatic injury.  She had a lot of fibrous tissue on there, but otherwise is starting the healing process.  There has been no change in her medications or social history.  PHYSICAL EXAMINATION:  On exam, she is alert, cooperative, very pleasant as usual.  Little hard-of-hearing, but understands the treatment plan. Pupils were equal.  Extraocular muscles are intact.  No cervical lymphadenopathy.  Her breathing is unlabored.  Her heart is regular. Her abdomen is soft.  The wound is essentially the same size.  It was debrided quite a bit, and pressure was placed for blood control.  Highly recommend an ACell placement to get this healed up faster.  She is laying down a lot of fibrous tissue, but having difficult time epithelializing.  I do think the ACell would help that process along with the [facility], and we will get this worked out.  In the meantime, we will use Fibracol and Silvercel.     Wayland Denis, DO     CS/MEDQ  D:  12/23/2012  T:  12/23/2012  Job:  [id number]

## 2012-12-23 NOTE — Telephone Encounter (Signed)
Cindy notified.

## 2012-12-24 ENCOUNTER — Encounter (HOSPITAL_BASED_OUTPATIENT_CLINIC_OR_DEPARTMENT_OTHER): Payer: Self-pay | Admitting: *Deleted

## 2012-12-24 NOTE — Progress Notes (Signed)
Talked with pt and daughter-pt is independent living [facility]-does not drive-she is very clear. Will see if dr crouitoru has done an ekg on her-will need istat-and maybe ekg-pcp with [place] has not,

## 2012-12-25 ENCOUNTER — Encounter: Payer: Self-pay | Admitting: Internal Medicine

## 2012-12-25 NOTE — Progress Notes (Signed)
Reviewed notes with dr frizgerald-ok for here

## 2012-12-26 ENCOUNTER — Encounter (HOSPITAL_BASED_OUTPATIENT_CLINIC_OR_DEPARTMENT_OTHER): Payer: Self-pay | Admitting: Plastic Surgery

## 2012-12-26 ENCOUNTER — Encounter (HOSPITAL_BASED_OUTPATIENT_CLINIC_OR_DEPARTMENT_OTHER): Payer: Self-pay | Admitting: Anesthesiology

## 2012-12-26 ENCOUNTER — Encounter (HOSPITAL_BASED_OUTPATIENT_CLINIC_OR_DEPARTMENT_OTHER)
Admission: RE | Disposition: A | Discharge status: Home / Self Care | Payer: Self-pay | Source: Ambulatory Visit | Attending: Plastic Surgery

## 2012-12-26 ENCOUNTER — Ambulatory Visit (HOSPITAL_BASED_OUTPATIENT_CLINIC_OR_DEPARTMENT_OTHER): Payer: Medicare Other | Admitting: Anesthesiology

## 2012-12-26 ENCOUNTER — Ambulatory Visit (HOSPITAL_BASED_OUTPATIENT_CLINIC_OR_DEPARTMENT_OTHER)
Admission: RE | Admit: 2012-12-26 | Discharge: 2012-12-26 | Disposition: A | Discharge status: Home / Self Care | Payer: Medicare Other | Source: Ambulatory Visit | Attending: Plastic Surgery | Admitting: Plastic Surgery

## 2012-12-26 ENCOUNTER — Other Ambulatory Visit: Payer: Self-pay | Admitting: Plastic Surgery

## 2012-12-26 DIAGNOSIS — L97912 Non-pressure chronic ulcer of unspecified part of right lower leg with fat layer exposed: Secondary | ICD-10-CM

## 2012-12-26 DIAGNOSIS — I4891 Unspecified atrial fibrillation: Secondary | ICD-10-CM | POA: Insufficient documentation

## 2012-12-26 DIAGNOSIS — I509 Heart failure, unspecified: Secondary | ICD-10-CM | POA: Insufficient documentation

## 2012-12-26 DIAGNOSIS — F329 Major depressive disorder, single episode, unspecified: Secondary | ICD-10-CM | POA: Insufficient documentation

## 2012-12-26 DIAGNOSIS — G8929 Other chronic pain: Secondary | ICD-10-CM | POA: Insufficient documentation

## 2012-12-26 DIAGNOSIS — M129 Arthropathy, unspecified: Secondary | ICD-10-CM | POA: Insufficient documentation

## 2012-12-26 DIAGNOSIS — M545 Low back pain, unspecified: Secondary | ICD-10-CM | POA: Insufficient documentation

## 2012-12-26 DIAGNOSIS — Z880 Allergy status to penicillin: Secondary | ICD-10-CM | POA: Insufficient documentation

## 2012-12-26 DIAGNOSIS — Y92009 Unspecified place in unspecified non-institutional (private) residence as the place of occurrence of the external cause: Secondary | ICD-10-CM | POA: Insufficient documentation

## 2012-12-26 DIAGNOSIS — Z7982 Long term (current) use of aspirin: Secondary | ICD-10-CM | POA: Insufficient documentation

## 2012-12-26 DIAGNOSIS — G709 Myoneural disorder, unspecified: Secondary | ICD-10-CM | POA: Insufficient documentation

## 2012-12-26 DIAGNOSIS — M810 Age-related osteoporosis without current pathological fracture: Secondary | ICD-10-CM | POA: Insufficient documentation

## 2012-12-26 DIAGNOSIS — Z882 Allergy status to sulfonamides status: Secondary | ICD-10-CM | POA: Insufficient documentation

## 2012-12-26 DIAGNOSIS — W1809XA Striking against other object with subsequent fall, initial encounter: Secondary | ICD-10-CM | POA: Insufficient documentation

## 2012-12-26 DIAGNOSIS — L97909 Non-pressure chronic ulcer of unspecified part of unspecified lower leg with unspecified severity: Secondary | ICD-10-CM | POA: Insufficient documentation

## 2012-12-26 DIAGNOSIS — I2510 Atherosclerotic heart disease of native coronary artery without angina pectoris: Secondary | ICD-10-CM | POA: Insufficient documentation

## 2012-12-26 DIAGNOSIS — J309 Allergic rhinitis, unspecified: Secondary | ICD-10-CM | POA: Insufficient documentation

## 2012-12-26 DIAGNOSIS — K219 Gastro-esophageal reflux disease without esophagitis: Secondary | ICD-10-CM | POA: Insufficient documentation

## 2012-12-26 DIAGNOSIS — Z87891 Personal history of nicotine dependence: Secondary | ICD-10-CM | POA: Insufficient documentation

## 2012-12-26 DIAGNOSIS — Z79899 Other long term (current) drug therapy: Secondary | ICD-10-CM | POA: Insufficient documentation

## 2012-12-26 DIAGNOSIS — IMO0002 Reserved for concepts with insufficient information to code with codable children: Secondary | ICD-10-CM | POA: Insufficient documentation

## 2012-12-26 DIAGNOSIS — I10 Essential (primary) hypertension: Secondary | ICD-10-CM | POA: Insufficient documentation

## 2012-12-26 DIAGNOSIS — E785 Hyperlipidemia, unspecified: Secondary | ICD-10-CM | POA: Insufficient documentation

## 2012-12-26 DIAGNOSIS — F3289 Other specified depressive episodes: Secondary | ICD-10-CM | POA: Insufficient documentation

## 2012-12-26 DIAGNOSIS — M4800 Spinal stenosis, site unspecified: Secondary | ICD-10-CM | POA: Insufficient documentation

## 2012-12-26 HISTORY — PX: I & D EXTREMITY: SHX5045

## 2012-12-26 LAB — POCT I-STAT, CHEM 8
Creatinine, Ser: 1.1 mg/dL (ref 0.50–1.10)
Glucose, Bld: 102 mg/dL — ABNORMAL HIGH (ref 70–99)
Hemoglobin: 11.2 g/dL — ABNORMAL LOW (ref 12.0–15.0)
TCO2: 30 mmol/L (ref 0–100)

## 2012-12-26 SURGERY — IRRIGATION AND DEBRIDEMENT EXTREMITY
Anesthesia: Monitor Anesthesia Care | Site: Leg Lower | Laterality: Right | Wound class: Contaminated

## 2012-12-26 MED ORDER — FENTANYL CITRATE 0.05 MG/ML IJ SOLN
50.0000 ug | INTRAMUSCULAR | Status: DC | PRN
  Administered 2012-12-26: 25 ug via INTRAVENOUS

## 2012-12-26 MED ORDER — FENTANYL CITRATE 0.05 MG/ML IJ SOLN
25.0000 ug | INTRAMUSCULAR | Status: DC | PRN
  Administered 2012-12-26 (×2): 25 ug via INTRAVENOUS

## 2012-12-26 MED ORDER — PHENYLEPHRINE HCL 10 MG/ML IJ SOLN
INTRAMUSCULAR | Status: DC | PRN
  Administered 2012-12-26: 40 ug via INTRAVENOUS

## 2012-12-26 MED ORDER — CIPROFLOXACIN IN D5W 400 MG/200ML IV SOLN
400.0000 mg | INTRAVENOUS | Status: AC
Start: 2012-12-26 — End: 2012-12-26
  Administered 2012-12-26 (×2): 400 mg via INTRAVENOUS

## 2012-12-26 MED ORDER — PROPOFOL 10 MG/ML IV BOLUS
INTRAVENOUS | Status: DC | PRN
  Administered 2012-12-26 (×2): 20 mg via INTRAVENOUS

## 2012-12-26 MED ORDER — ONDANSETRON HCL 4 MG/2ML IJ SOLN: 4.0000 mg | Freq: Once | INTRAMUSCULAR | Status: DC | PRN

## 2012-12-26 MED ORDER — SODIUM CHLORIDE 0.9 % IR SOLN
Status: DC | PRN
  Administered 2012-12-26: 09:00:00

## 2012-12-26 MED ORDER — LACTATED RINGERS IV SOLN
INTRAVENOUS | Status: DC
  Administered 2012-12-26: 08:00:00 via INTRAVENOUS

## 2012-12-26 MED ORDER — MIDAZOLAM HCL 2 MG/2ML IJ SOLN: 1.0000 mg | INTRAMUSCULAR | Status: DC | PRN

## 2012-12-26 MED ORDER — LIDOCAINE HCL (CARDIAC) 20 MG/ML IV SOLN
INTRAVENOUS | Status: DC | PRN
  Administered 2012-12-26: 30 mg via INTRAVENOUS

## 2012-12-26 MED ORDER — MIDAZOLAM HCL 5 MG/5ML IJ SOLN
INTRAMUSCULAR | Status: DC | PRN
  Administered 2012-12-26: .5 mg via INTRAVENOUS

## 2012-12-26 SURGICAL SUPPLY — 92 items
APL SKNCLS STERI-STRIP NONHPOA (GAUZE/BANDAGES/DRESSINGS)
BAG DECANTER FOR FLEXI CONT (MISCELLANEOUS) IMPLANT
BANDAGE ELASTIC 3 VELCRO ST LF (GAUZE/BANDAGES/DRESSINGS) IMPLANT
BANDAGE ELASTIC 4 VELCRO ST LF (GAUZE/BANDAGES/DRESSINGS) ×1 IMPLANT
BANDAGE ELASTIC 6 VELCRO ST LF (GAUZE/BANDAGES/DRESSINGS) IMPLANT
BANDAGE GAUZE ELAST BULKY 4 IN (GAUZE/BANDAGES/DRESSINGS) ×1 IMPLANT
BENZOIN TINCTURE PRP APPL 2/3 (GAUZE/BANDAGES/DRESSINGS) IMPLANT
BLADE HEX COATED 2.75 (ELECTRODE) IMPLANT
BLADE MINI RND TIP GREEN BEAV (BLADE) IMPLANT
BLADE SURG 10 STRL SS (BLADE) ×1 IMPLANT
BLADE SURG 15 STRL LF DISP TIS (BLADE) ×1 IMPLANT
BLADE SURG 15 STRL SS (BLADE) ×2
BNDG CMPR 9X4 STRL LF SNTH (GAUZE/BANDAGES/DRESSINGS)
BNDG COHESIVE 1X5 TAN STRL LF (GAUZE/BANDAGES/DRESSINGS) IMPLANT
BNDG COHESIVE 4X5 TAN STRL (GAUZE/BANDAGES/DRESSINGS) IMPLANT
BNDG ESMARK 4X9 LF (GAUZE/BANDAGES/DRESSINGS) IMPLANT
CANISTER OMNI JUG 16 LITER (MISCELLANEOUS) IMPLANT
CANISTER SUCTION 1200CC (MISCELLANEOUS) ×1 IMPLANT
CANISTER SUCTION 2500CC (MISCELLANEOUS) IMPLANT
CHLORAPREP W/TINT 26ML (MISCELLANEOUS) ×1 IMPLANT
CLOTH BEACON ORANGE TIMEOUT ST (SAFETY) ×2 IMPLANT
CORDS BIPOLAR (ELECTRODE) IMPLANT
COVER MAYO STAND STRL (DRAPES) ×2 IMPLANT
COVER TABLE BACK 60X90 (DRAPES) ×2 IMPLANT
DECANTER SPIKE VIAL GLASS SM (MISCELLANEOUS) IMPLANT
DRAIN PENROSE 1/2X12 LTX STRL (WOUND CARE) IMPLANT
DRAPE EXTREMITY T 121X128X90 (DRAPE) ×1 IMPLANT
DRAPE INCISE IOBAN 66X45 STRL (DRAPES) ×1 IMPLANT
DRSG ADAPTIC 3X8 NADH LF (GAUZE/BANDAGES/DRESSINGS) ×1 IMPLANT
DRSG EMULSION OIL 3X3 NADH (GAUZE/BANDAGES/DRESSINGS) IMPLANT
DRSG PAD ABDOMINAL 8X10 ST (GAUZE/BANDAGES/DRESSINGS) IMPLANT
ELECT NDL TIP 2.8 STRL (NEEDLE) IMPLANT
ELECT NEEDLE TIP 2.8 STRL (NEEDLE) IMPLANT
ELECT REM PT RETURN 9FT ADLT (ELECTROSURGICAL) ×2
ELECTRODE REM PT RTRN 9FT ADLT (ELECTROSURGICAL) ×1 IMPLANT
GAUZE SPONGE 4X4 12PLY STRL LF (GAUZE/BANDAGES/DRESSINGS) IMPLANT
GAUZE XEROFORM 1X8 LF (GAUZE/BANDAGES/DRESSINGS) IMPLANT
GAUZE XEROFORM 5X9 LF (GAUZE/BANDAGES/DRESSINGS) IMPLANT
GLOVE BIO SURGEON STRL SZ 6.5 (GLOVE) ×3 IMPLANT
GLOVE BIO SURGEON STRL SZ7 (GLOVE) ×1 IMPLANT
GLOVE BIOGEL PI IND STRL 7.0 (GLOVE) IMPLANT
GLOVE BIOGEL PI INDICATOR 7.0 (GLOVE) ×1
GLOVE SURG SS PI 7.0 STRL IVOR (GLOVE) ×1 IMPLANT
GOWN PREVENTION PLUS XLARGE (GOWN DISPOSABLE) ×4 IMPLANT
HANDPIECE INTERPULSE COAX TIP (DISPOSABLE)
IV NS IRRIG 3000ML ARTHROMATIC (IV SOLUTION) IMPLANT
MATRIX SURGICAL PSM 7X10CM (Tissue) ×1 IMPLANT
MICROMATRIX 100MG MM0100 (Tissue) ×3 IMPLANT
MICROMATRIX 200MG (Tissue) ×1 IMPLANT
NDL HYPO 30GX1 BEV (NEEDLE) IMPLANT
NEEDLE 27GAX1X1/2 (NEEDLE) IMPLANT
NEEDLE HYPO 30GX1 BEV (NEEDLE) IMPLANT
NS IRRIG 1000ML POUR BTL (IV SOLUTION) ×2 IMPLANT
PACK BASIN DAY SURGERY FS (CUSTOM PROCEDURE TRAY) ×2 IMPLANT
PADDING CAST ABS 3INX4YD NS (CAST SUPPLIES)
PADDING CAST ABS 4INX4YD NS (CAST SUPPLIES)
PADDING CAST ABS COTTON 3X4 (CAST SUPPLIES) IMPLANT
PADDING CAST ABS COTTON 4X4 ST (CAST SUPPLIES) IMPLANT
PENCIL BUTTON HOLSTER BLD 10FT (ELECTRODE) ×1 IMPLANT
SET HNDPC FAN SPRY TIP SCT (DISPOSABLE) IMPLANT
SHEET MEDIUM DRAPE 40X70 STRL (DRAPES) IMPLANT
SLEEVE SCD COMPRESS KNEE MED (MISCELLANEOUS) IMPLANT
SPLINT PLASTER CAST XFAST 3X15 (CAST SUPPLIES) IMPLANT
SPLINT PLASTER XTRA FASTSET 3X (CAST SUPPLIES)
SPONGE GAUZE 4X4 12PLY (GAUZE/BANDAGES/DRESSINGS) ×2 IMPLANT
SPONGE LAP 18X18 X RAY DECT (DISPOSABLE) ×2 IMPLANT
SPONGE LAP 4X18 X RAY DECT (DISPOSABLE) IMPLANT
STAPLER VISISTAT 35W (STAPLE) IMPLANT
STOCKINETTE 4X48 STRL (DRAPES) IMPLANT
STOCKINETTE 6  STRL (DRAPES) ×1
STOCKINETTE 6 STRL (DRAPES) ×1 IMPLANT
STOCKINETTE IMPERVIOUS LG (DRAPES) IMPLANT
STRIP CLOSURE SKIN 1/2X4 (GAUZE/BANDAGES/DRESSINGS) IMPLANT
SUCTION FRAZIER TIP 10 FR DISP (SUCTIONS) ×1 IMPLANT
SURGILUBE 2OZ TUBE FLIPTOP (MISCELLANEOUS) ×1 IMPLANT
SUT ETHILON 3 0 PS 1 (SUTURE) IMPLANT
SUT ETHILON 4 0 P 3 18 (SUTURE) IMPLANT
SUT ETHILON 5 0 PS 2 18 (SUTURE) IMPLANT
SUT PROLENE 3 0 PS 2 (SUTURE) IMPLANT
SUT SILK 3 0 PS 1 (SUTURE) IMPLANT
SUT VIC AB 3-0 FS2 27 (SUTURE) IMPLANT
SUT VIC AB 5-0 P-3 18X BRD (SUTURE) IMPLANT
SUT VIC AB 5-0 P3 18 (SUTURE) ×2
SUT VIC AB 5-0 PS2 18 (SUTURE) ×1 IMPLANT
SYR BULB IRRIGATION 50ML (SYRINGE) IMPLANT
SYR CONTROL 10ML LL (SYRINGE) ×2 IMPLANT
TAPE HYPAFIX 6X30 (GAUZE/BANDAGES/DRESSINGS) IMPLANT
TOWEL OR 17X24 6PK STRL BLUE (TOWEL DISPOSABLE) ×2 IMPLANT
TRAY DSU PREP LF (CUSTOM PROCEDURE TRAY) ×1 IMPLANT
TUBE CONNECTING 20X1/4 (TUBING) ×1 IMPLANT
UNDERPAD 30X30 INCONTINENT (UNDERPADS AND DIAPERS) ×2 IMPLANT
YANKAUER SUCT BULB TIP NO VENT (SUCTIONS) IMPLANT

## 2012-12-26 NOTE — Anesthesia Procedure Notes (Signed)
Procedure Name: MAC Date/Time: 12/26/2012 8:28 AM Performed by: Zenia Resides D Pre-anesthesia Checklist: Patient identified, Timeout performed, Emergency Drugs available, Suction available and Patient being monitored Oxygen Delivery Method: Simple face mask Placement Confirmation: CO2 detector

## 2012-12-26 NOTE — Transfer of Care (Signed)
Immediate Anesthesia Transfer of Care Note  Patient: Julia Black  Procedure(s) Performed: Procedure(s): IRRIGATION AND DEBRIDEMENT OF RIGHT LEG, SURGICAL PREP PLACEMENT OF A CELL AND VAC    (Right)  Patient Location: PACU  Anesthesia Type:MAC  Level of Consciousness: awake, alert  and oriented  Airway & Oxygen Therapy: Patient Spontanous Breathing and Patient connected to face mask oxygen  Post-op Assessment: Report given to PACU RN and Post -op Vital signs reviewed and stable  Post vital signs: Reviewed and stable  Complications: No apparent anesthesia complications

## 2012-12-26 NOTE — Interval H&P Note (Signed)
History and Physical Interval Note:  12/26/2012 7:18 AM  Julia Black  has presented today for surgery, with the diagnosis of ulcer of right leg   The various methods of treatment have been discussed with the patient and family. After consideration of risks, benefits and other options for treatment, the patient has consented to  Procedure(s): IRRIGATION AND DEBRIDEMENT OF RIGHT LEG, SURGICAL PREP PLACEMENT OF A CELL AND VAC    (Right) as a surgical intervention .  The patient's history has been reviewed, patient examined, no change in status, stable for surgery.  I have reviewed the patient's chart and labs.  Questions were answered to the patient's satisfaction.     SANGER,Proctor Carriker

## 2012-12-26 NOTE — Brief Op Note (Signed)
12/26/2012  9:05 AM  PATIENT:  Julia Black  79 y.o. female  PRE-OPERATIVE DIAGNOSIS:  ulcer of right leg   POST-OPERATIVE DIAGNOSIS:  ulcer of right leg   PROCEDURE:  Procedure(s): IRRIGATION AND DEBRIDEMENT OF RIGHT LEG, SURGICAL PREP PLACEMENT OF A CELL AND VAC    (Right)  SURGEON:  Surgeon(s) and Role:    * Claire Sanger, DO - Primary  PHYSICIAN ASSISTANT: Shawn Rayburn, PA  ASSISTANTS: none   ANESTHESIA:  sedation  EBL:  Total I/O In: 750 [I.V.:750] Out: -   BLOOD ADMINISTERED:none  DRAINS: none   LOCAL MEDICATIONS USED:  NONE  SPECIMEN:  No Specimen  DISPOSITION OF SPECIMEN:  N/A  COUNTS:  YES  TOURNIQUET:  * No tourniquets in log *  DICTATION: .Dragon Dictation  PLAN OF CARE: Discharge to home after PACU  PATIENT DISPOSITION:  PACU - hemodynamically stable.   Delay start of Pharmacological VTE agent (>24hrs) due to surgical blood loss or risk of bleeding: no

## 2012-12-26 NOTE — Op Note (Signed)
Operative Note  Pre-operative Diagnosis: right leg ulcer from trauma  Post-operative Diagnosis: same  Procedure: Irrigation and debridement of right leg ulcer with Acell and VAC placement 15 x 8 x .2 cm  Surgeon: Dr. Alan Ripper Sanger  Assistant: Lazaro Arms, PA  Indications: nonhealing ulcer  Anesthesia: 1% lidocaine with epinephrine  Procedure Details  The procedure, risks and complications have been discussed in detail (including, but not limited to airway compromise, infection, bleeding) with the patient, and the patient has signed consent to the procedure. A time out was called and all information was confirmed to be correct.  The skin was sterilely prepped and draped over the affected area in the usual fashion. After adequate anesthesia, the ulcer was irrigated with saline and antibiotic solution.  The ulcer was debrided with gauze and a pickup.  Hemostasis was achieved with pressure.  The Acell powder was applied (500gm) and the sheet (7 x 10 cm) were prepared according to the manufacture guidelines and placed on the wound.  The sheet was secured to the area with 5-0 vicryl. An adaptic sheet and KY gel was applied and the VAC sponge was secured with Ioban.  An excellent seal was obtained. She was allowed to wake up and taken to recovery room in stable condition.  EBL: nil cc's  Drains: none  Condition: Tolerated the procedure well  Complications: none.

## 2012-12-26 NOTE — H&P (Signed)
Julia Black is an 79 y.o. female.   Chief Complaint: lower extremity ulcer HPI: The patient is a 79 yrs old female here for treatment of her lower extremity ulcer.  She was involved in a traumatic incident that lead to a nonhealing ulcer.  She feel at home and her leg hit the corner of the furniture. She is otherwise stable and not had any recent illnesses.  She has several medical conditions but they are currently under control. She has undergone local care at the wound center but her progress has been slow and she is not able to tolerate debridement in the clinic due to pain.  Past Medical History  Diagnosis Date  . CORONARY ARTERY DISEASE 2004    s/p CABG  . SPINAL STENOSIS   . LOW BACK PAIN     chronic, follows with Nsurg for ESI  . FRACTURE, PELVIS, RIGHT 12/2009  . HIP FRACTURE, LEFT 09/2009    s/p L THA  . Atrial fibrillation     no anticoag due to fall risk/age  . ALLERGIC RHINITIS   . CHF (congestive heart failure)   . Carpal tunnel syndrome   . Hypertension   . HYPERLIPIDEMIA   . GERD   . DEPRESSION   . Arthritis   . Osteoporosis     Past Surgical History  Procedure Laterality Date  . (l) hip hemiarthroplasty  09/2009  . Appendectomy  1940  . Eye surgery      both cataracts  . Coronary artery bypass graft  2004  . Carpal tunnel release  2004    right and left-dsc  . Ulnar nerve transposition  2009    lt   . Vein ligation      legs  . Colonoscopy      Family History  Problem Relation Age of Onset  . Arthritis Mother   . Breast cancer Mother   . Heart disease Mother   . Hypertension Mother   . Arthritis Father   . Heart disease Father   . Hypertension Father   . Arthritis Maternal Grandmother   . Arthritis Maternal Grandfather   . Arthritis Paternal Grandmother   . Arthritis Paternal Grandfather   . Breast cancer Daughter    Social History:  reports that she quit smoking about 34 years ago. She has never used smokeless tobacco. She reports that she does  not drink alcohol or use illicit drugs.  Allergies:  Allergies  Allergen Reactions  . Amoxicillin     Upset stomach with high dose  . Sulfa Antibiotics     ?    Medications Prior to Admission  Medication Sig Dispense Refill  . ALPRAZolam (XANAX) 0.25 MG tablet TAKE ONE TABLET BY MOUTH ONCE DAILY  30 tablet  0  . Ascorbic Acid (VITAMIN C) 500 MG tablet Take 500 mg by mouth daily.        [name] aspirin 81 MG tablet Take 81 mg by mouth daily.        . Calcium Carbonate-Vitamin D 600-400 MG-UNIT per tablet Take 1 tablet by mouth daily.        . carvedilol (COREG) 25 MG tablet TAKE 1/2 TABLET TWICE DAILY  30 tablet  5  . chlorhexidine (PERIDEX) 0.12 % solution       . Cholecalciferol (VITAMIN D3) 1000 UNITS CAPS Take by mouth daily.        . fentaNYL (DURAGESIC) 50 MCG/HR Place 1 patch (50 mcg total) onto the skin every 3 (three)  days.  10 patch  0  . furosemide (LASIX) 40 MG tablet Take 2 tablets (80 mg total) by mouth daily. X 5 days, then resume 40 mg daily (or as directed)  60 tablet  5  . hydrochlorothiazide (MICROZIDE) 12.5 MG capsule TAKE ONE CAPSULE ON MONDAY, WEDNESDAY,  AND FRIDAY  15 capsule  5  . lidocaine (LIDODERM) 5 % Place 3 patches onto the skin every 12 (twelve) hours. Remove & Discard patch within 12 hours or as directed by MD  90 patch  3  . LYRICA 50 MG capsule TAKE ONE CAPSULE THREE TIMES DAILY  90 capsule  5  . Multiple Vitamins-Minerals ([employer] SPECTRAVITE PO) Take by mouth daily.        . mupirocin ointment (BACTROBAN) 2 % Use qd w/dressing change  60 g  0  . nitroGLYCERIN (NITROSTAT) 0.6 MG SL tablet Place 0.6 mg under the tongue every 5 (five) minutes as needed.        . Omega-3 Fatty Acids (FISH OIL) 1000 MG CAPS Take by mouth daily.        [name] omeprazole (PRILOSEC) 20 MG capsule TAKE ONE CAPSULE EACH DAY  30 capsule  5  . polyethylene glycol (MIRALAX / GLYCOLAX) packet Take 17 g by mouth daily.        . potassium chloride SA (K-DUR,KLOR-CON) 20 MEQ tablet TAKE ONE TABLET  DAILY AS NEEDED  30 tablet  2  . pregabalin (LYRICA) 25 MG capsule Take 1 capsule (25 mg total) by mouth 3 (three) times daily.  90 capsule  3  . quinapril (ACCUPRIL) 10 MG tablet TAKE 1/2 TABLET TWICE DAILY  30 tablet  5  . simvastatin (ZOCOR) 40 MG tablet TAKE ONE TABLET AT BEDTIME  30 tablet  2  . traMADol (ULTRAM) 50 MG tablet Take 0.5-1 tablets (25-50 mg total) by mouth every 8 (eight) hours as needed for pain.  90 tablet  1  . venlafaxine XR (EFFEXOR-XR) 75 MG 24 hr capsule TAKE ONE CAPSULE EACH DAY  30 capsule  2  . zolpidem (AMBIEN) 5 MG tablet TAKE ONE TABLET AT BEDTIME AS NEEDED  30 tablet  5    No results found for this or any previous visit (from the past 48 hour(s)). No results found.  Review of Systems  Constitutional: Negative.   HENT: Negative.   Eyes: Negative.   Respiratory: Negative.   Cardiovascular: Negative.   Genitourinary: Negative.   Musculoskeletal: Negative.   Psychiatric/Behavioral: Negative.     Height 5\' 1"  (1.549 m), weight 52.164 kg (115 lb). Physical Exam  Constitutional: She appears well-developed and well-nourished.  HENT:  Head: Normocephalic and atraumatic.  Eyes: Conjunctivae are normal. Pupils are equal, round, and reactive to light.  Cardiovascular: Normal rate.   Respiratory: Effort normal.  GI: Soft.  Neurological: She is alert.  Skin: There is erythema.  Psychiatric: She has a normal mood and affect. Her behavior is normal.     Assessment/Plan Right Lower extremity ulcer - plan for debridement and placement of ACell.  SANGER,Laythan Hayter 12/26/2012, 7:07 AM

## 2012-12-26 NOTE — Anesthesia Preprocedure Evaluation (Signed)
Anesthesia Evaluation  Patient identified by MRN, date of birth, ID band Patient awake    Reviewed: Allergy & Precautions, H&P , NPO status , Patient's Chart, lab work & pertinent test results  Airway Mallampati: II  Neck ROM: full    Dental   Pulmonary former smoker,          Cardiovascular hypertension, + CAD, + CABG and +CHF + dysrhythmias Atrial Fibrillation     Neuro/Psych Depression  Neuromuscular disease    GI/Hepatic GERD-  ,  Endo/Other    Renal/GU      Musculoskeletal   Abdominal   Peds  Hematology   Anesthesia Other Findings   Reproductive/Obstetrics                           Anesthesia Physical Anesthesia Plan  ASA: III  Anesthesia Plan: MAC   Post-op Pain Management:    Induction: Intravenous  Airway Management Planned: Simple Face Mask  Additional Equipment:   Intra-op Plan:   Post-operative Plan:   Informed Consent: I have reviewed the patients History and Physical, chart, labs and discussed the procedure including the risks, benefits and alternatives for the proposed anesthesia with the patient or authorized representative who has indicated his/her understanding and acceptance.     Plan Discussed with: CRNA, Anesthesiologist and Surgeon  Anesthesia Plan Comments:         Anesthesia Quick Evaluation

## 2012-12-26 NOTE — Anesthesia Postprocedure Evaluation (Signed)
Anesthesia Post Note  Patient: Julia Black  Procedure(s) Performed: Procedure(s) (LRB): IRRIGATION AND DEBRIDEMENT OF RIGHT LEG, SURGICAL PREP PLACEMENT OF A CELL AND VAC    (Right)  Anesthesia type: MAC  Patient location: PACU  Post pain: Pain level controlled and Adequate analgesia  Post assessment: Post-op Vital signs reviewed, Patient's Cardiovascular Status Stable and Respiratory Function Stable  Last Vitals:  Filed Vitals:   12/26/12 0748  BP: 77/52  Pulse: 65  Temp: 36.8 C    Post vital signs: Reviewed and stable  Level of consciousness: awake, alert  and oriented  Complications: No apparent anesthesia complications

## 2012-12-30 ENCOUNTER — Encounter (HOSPITAL_BASED_OUTPATIENT_CLINIC_OR_DEPARTMENT_OTHER): Payer: Self-pay | Admitting: Plastic Surgery

## 2012-12-30 NOTE — Progress Notes (Signed)
[facility]  NAME:  Julia Black, Julia Black                   ACCOUNT NO.:  [id number]  MEDICAL RECORD NO.:  [id number]      DATE OF BIRTH:  August 13, 1918  PHYSICIAN:  Wayland Denis, DO       VISIT DATE:  12/30/2012                                  OFFICE VISIT   The patient is a 79 year old female who had an injury to her right lower extremity, does have chronic venous insufficiency.  She underwent OR debridement with placement of ACell and she has done extremely well over the past week with the [facility] in place.  She is alert, oriented, cooperative, not in any acute distress.  The ACell is incorporating.  The periwound area is a little bit irritated from the back, but overall she is doing extremely well.  We will continue with Adaptic, Hydrogel, and the VAC and see her back in a week.     Wayland Denis, DO     CS/MEDQ  D:  12/30/2012  T:  12/30/2012  Job:  [id number]

## 2012-12-31 ENCOUNTER — Encounter: Payer: Self-pay | Admitting: Internal Medicine

## 2012-12-31 ENCOUNTER — Ambulatory Visit (INDEPENDENT_AMBULATORY_CARE_PROVIDER_SITE_OTHER): Payer: Medicare Other | Admitting: Internal Medicine

## 2012-12-31 VITALS — BP 90/52 | HR 68 | Temp 97.5°F | Wt 115.8 lb

## 2012-12-31 DIAGNOSIS — M4800 Spinal stenosis, site unspecified: Secondary | ICD-10-CM

## 2012-12-31 DIAGNOSIS — I509 Heart failure, unspecified: Secondary | ICD-10-CM

## 2012-12-31 DIAGNOSIS — S8010XA Contusion of unspecified lower leg, initial encounter: Secondary | ICD-10-CM

## 2012-12-31 DIAGNOSIS — S8011XA Contusion of right lower leg, initial encounter: Secondary | ICD-10-CM

## 2012-12-31 MED ORDER — FENTANYL 50 MCG/HR TD PT72: 1.0000 | MEDICATED_PATCH | TRANSDERMAL | Status: DC

## 2012-12-31 NOTE — Progress Notes (Signed)
  Subjective:    Patient ID: Julia Black, female    DOB: 12/11/1918, 79 y.o.   MRN: [id number]  HPI  Here for followup, reviewed interval medical history as well as chronic issues  Past Medical History  Diagnosis Date  . SPINAL STENOSIS   . LOW BACK PAIN     chronic, follows with Nsurg for ESI  . FRACTURE, PELVIS, RIGHT 12/2009  . HIP FRACTURE, LEFT 09/2009    s/p L THA  . ALLERGIC RHINITIS   . CHF (congestive heart failure)   . Carpal tunnel syndrome   . Hypertension   . HYPERLIPIDEMIA   . GERD   . Arthritis   . Osteoporosis   . CORONARY ARTERY DISEASE 2004    s/p CABG  . Atrial fibrillation     no anticoag due to fall risk/age  . DEPRESSION      Review of Systems  Constitutional: Negative for fever and unexpected weight change.  Musculoskeletal: Negative for joint swelling and arthralgias.  Skin: Positive for wound (RLE).  Neurological: Negative for syncope, weakness and numbness.       Objective:   Physical Exam  BP 90/52  Pulse 68  Temp(Src) 97.5 F (36.4 C) (Oral)  Wt 115 lb 12.8 oz (52.527 kg)  BMI 21.89 kg/m2  SpO2 90% Wt Readings from Last 3 Encounters:  12/31/12 115 lb 12.8 oz (52.527 kg)  12/26/12 113 lb (51.256 kg)  12/26/12 113 lb (51.256 kg)   General: Uncomfortable but no acute distress. Dtr at side Cardiovascular: Regular rate and rhythm, 3/6 syst murmur without change - no edema Respiratory: Clear to auscultation, no wheeze or crackle Abdomen: Minimally distended but soft and nontender. Bowel sounds present throughout, no rebound or guarding Skin- RLE wound dressing c/d/i - not examined by me  Lab Results  Component Value Date   WBC 9.8 03/26/2012   HGB 11.2* 12/26/2012   HCT 33.0* 12/26/2012   PLT 194.0 03/26/2012   GLUCOSE 102* 12/26/2012   CHOL 165 03/26/2012   TRIG 121.0 03/26/2012   HDL 56.50 03/26/2012   LDLCALC 84 03/26/2012   ALT 27 03/26/2012   AST 29 03/26/2012   NA 140 12/26/2012   K 4.4 12/26/2012   CL 103 12/26/2012   CREATININE  1.10 12/26/2012   BUN 40* 12/26/2012   CO2 32 08/12/2012   TSH 2.312 08/11/2010   INR 0.91 08/11/2010   HGBA1C  Value: 5.4 (NOTE)                                                                       According to the ADA Clinical Practice Recommendations for 2011, when HbA1c is used as a screening test:   >=6.5%   Diagnostic of Diabetes Mellitus           (if abnormal result  is confirmed)  5.7-6.4%   Increased risk of developing Diabetes Mellitus  References:Diagnosis and Classification of Diabetes Mellitus,Diabetes Care,2011,34(Suppl 1):S62-S69 and Standards of Medical Care in         Diabetes - 2011,Diabetes Care,2011,34  (Suppl 1):S11-S61. 08/11/2010        Assessment & Plan:   See problem list. Medications and labs reviewed today.

## 2012-12-31 NOTE — Patient Instructions (Signed)
It was good to see you today. We have reviewed your prior records including labs and tests today Medications reviewed and updated: Take Lasix daily only if needed for swelling or weight gain greater than 3 pounds Take potassium pill only with Lasix use Stop her quinalapril Continue Coreg half tablet  Continue Lyrica 50 mg 3 times daily as tolerated for pain Please schedule followup in 3-4 months, call sooner if problems.

## 2012-12-31 NOTE — Assessment & Plan Note (Signed)
nonoperable -(s/p Nsurg and ortho eval) chronic and significant, associated with severe back and leg pain -  Has been on chronic hydrocodone prn for years-  added fentanyl with ongoing titration since October 2012, then backed down to patch due to adverse side effects 10/2011 added Lidoderm patch 05/31/11>> much improved initially, on max daily dose now Added Lyrica 04/2012 - improved! ( Titrate up Lyrica to 50mg  TID (previously caused intolerable dizziness, but on 50 BID now without side effects) Has DC's use of hydrocodone Ok for prn tramadol since being on Lyrica Duragesic patch and lidoderm patch without change

## 2012-12-31 NOTE — Assessment & Plan Note (Signed)
history of diastolic failure - exacerbation during hospitalization for hip fracture 2011 Last exacerbation Feb 2014, now improved. Related to med noncompliance at the time. Reduction in weight and improvement in dyspnea symptoms, stable at this time continue Lasix qd only prn and potassium once daily with Lasix -  Also DC ACEI and reduce BB dose due to hypotension

## 2012-12-31 NOTE — Assessment & Plan Note (Signed)
Self-inflicted injury of right shin June 4 Status post Gen. Surgery evaluation following outpatient attempted debridement here (OV reviewed) Wound clinic specialty evaluation prompting an operative I&D June 19 Subsequent followup with wound care center reports healing appropriately Continue pain management as ongoing, reviewed no antibiotic indication with family as no infection

## 2013-01-02 ENCOUNTER — Other Ambulatory Visit: Payer: Self-pay | Admitting: Internal Medicine

## 2013-01-06 ENCOUNTER — Other Ambulatory Visit: Payer: Self-pay | Admitting: Internal Medicine

## 2013-01-06 NOTE — Progress Notes (Signed)
[facility]  NAME:  Julia Black, Julia Black                   ACCOUNT NO.:  [id number]  MEDICAL RECORD NO.:  [id number]      DATE OF BIRTH:  [birth year]  PHYSICIAN:  Wayland Denis, DO       VISIT DATE:  01/06/2013                                  OFFICE VISIT   The patient is a 79 year old female who is here for followup on her right lower extremity ulcer.  She had irrigation and debridement, ACell placement, and has done extremely well with the VAC.  She has nearly healed the area completely and epithelialized.  At this point, we will do the Adaptic, Hydrogel, and a wrap and see her back in 1 week, but she may very well be healed at that time.     Wayland Denis, DO     CS/MEDQ  D:  01/06/2013  T:  01/06/2013  Job:  [id number]

## 2013-01-06 NOTE — Telephone Encounter (Signed)
[facility]ed script [facility] to [facility].../lmb 

## 2013-01-07 ENCOUNTER — Encounter: Payer: Self-pay | Admitting: Internal Medicine

## 2013-01-07 ENCOUNTER — Ambulatory Visit (INDEPENDENT_AMBULATORY_CARE_PROVIDER_SITE_OTHER): Payer: Medicare Other | Admitting: Internal Medicine

## 2013-01-07 VITALS — BP 120/60 | HR 73 | Temp 97.4°F

## 2013-01-07 DIAGNOSIS — N390 Urinary tract infection, site not specified: Secondary | ICD-10-CM

## 2013-01-07 DIAGNOSIS — I10 Essential (primary) hypertension: Secondary | ICD-10-CM

## 2013-01-07 LAB — POCT URINALYSIS DIPSTICK
Bilirubin, UA: NEGATIVE
Glucose, UA: NEGATIVE
Leukocytes, UA: NEGATIVE
Nitrite, UA: NEGATIVE
Urobilinogen, UA: 0.2
pH, UA: 5

## 2013-01-07 MED ORDER — CIPROFLOXACIN HCL 250 MG PO TABS: 250.0000 mg | ORAL_TABLET | Freq: Two times a day (BID) | ORAL | Status: DC

## 2013-01-07 NOTE — Progress Notes (Signed)
Subjective:    Patient ID: Julia Black, female    DOB: 11/03/1918, 79 y.o.   MRN: [id number]  Urinary Tract Infection    complains of dysuria symptoms associated with urinary frequency and nocturia  Also BP higher in PM since med changes last week - home log reviewed  Past Medical History  Diagnosis Date  . SPINAL STENOSIS   . LOW BACK PAIN     chronic, follows with Nsurg for ESI  . FRACTURE, PELVIS, RIGHT 12/2009  . HIP FRACTURE, LEFT 09/2009    s/p L THA  . ALLERGIC RHINITIS   . CHF (congestive heart failure)   . Carpal tunnel syndrome   . Hypertension   . HYPERLIPIDEMIA   . GERD   . Arthritis   . Osteoporosis   . CORONARY ARTERY DISEASE 2004    s/p CABG  . Atrial fibrillation     no anticoag due to fall risk/age  . DEPRESSION      Review of Systems  Constitutional: Negative for fever and unexpected weight change.  Musculoskeletal: Negative for joint swelling and arthralgias.  Skin: Positive for wound (RLE).  Neurological: Negative for syncope, weakness and numbness.       Objective:   Physical Exam  BP 120/60  Pulse 73  Temp(Src) 97.4 F (36.3 C) (Oral)  SpO2 94% Wt Readings from Last 3 Encounters:  12/31/12 115 lb 12.8 oz (52.527 kg)  12/26/12 113 lb (51.256 kg)  12/26/12 113 lb (51.256 kg)   General: Uncomfortable but no acute distress. Son and dtr in law at side Cardiovascular: Regular rate and rhythm, 3/6 syst murmur without change - no edema Respiratory: Clear to auscultation, no wheeze or crackle Abdomen: Minimally distended but soft and nontender. Bowel sounds present throughout, no rebound or guarding Skin- RLE wound dressing c/d/i - not examined by me  Lab Results  Component Value Date   WBC 9.8 03/26/2012   HGB 11.2* 12/26/2012   HCT 33.0* 12/26/2012   PLT 194.0 03/26/2012   GLUCOSE 102* 12/26/2012   CHOL 165 03/26/2012   TRIG 121.0 03/26/2012   HDL 56.50 03/26/2012   LDLCALC 84 03/26/2012   ALT 27 03/26/2012   AST 29 03/26/2012   NA 140  12/26/2012   K 4.4 12/26/2012   CL 103 12/26/2012   CREATININE 1.10 12/26/2012   BUN 40* 12/26/2012   CO2 32 08/12/2012   TSH 2.312 08/11/2010   INR 0.91 08/11/2010   HGBA1C  Value: 5.4 (NOTE)                                                                       According to the ADA Clinical Practice Recommendations for 2011, when HbA1c is used as a screening test:   >=6.5%   Diagnostic of Diabetes Mellitus           (if abnormal result  is confirmed)  5.7-6.4%   Increased risk of developing Diabetes Mellitus  References:Diagnosis and Classification of Diabetes Mellitus,Diabetes Care,2011,34(Suppl 1):S62-S69 and Standards of Medical Care in         Diabetes - 2011,Diabetes Care,2011,34  (Suppl 1):S11-S61. 08/11/2010        Assessment & Plan:   See problem list. Medications and labs reviewed today.  Dysuria. urine point-of-care today reviewed, mild hemoglobin - given classic symptoms, history of same and high-risk patient, treat empirically with Cipro low dose x5 days

## 2013-01-07 NOTE — Patient Instructions (Signed)
It was good to see you today. We have reviewed your prior records including labs and tests today Resume Coreg half tablet twice daily for control of evening blood pressure spikes Cipro antibiotics twice daily for 5 days - Your prescription(s) have been submitted to your pharmacy. Please take as directed and contact our office if you believe you are having problem(s) with the medication(s). Okay to shower prior to wound bandage change

## 2013-01-07 NOTE — Assessment & Plan Note (Signed)
BP Readings from Last 3 Encounters:  01/07/13 120/60  12/31/12 90/52  12/26/12 97/41   Changes last week due to low BP - now increase trend in PM Resume BID coreg - consider change to XR if needed Remain off ACEI Family will monitor and report BP trends

## 2013-01-13 ENCOUNTER — Encounter (HOSPITAL_BASED_OUTPATIENT_CLINIC_OR_DEPARTMENT_OTHER): Payer: Medicare Other | Attending: Plastic Surgery

## 2013-01-13 DIAGNOSIS — L97809 Non-pressure chronic ulcer of other part of unspecified lower leg with unspecified severity: Secondary | ICD-10-CM | POA: Insufficient documentation

## 2013-01-20 ENCOUNTER — Telehealth: Payer: Self-pay | Admitting: *Deleted

## 2013-01-20 MED ORDER — LIDOCAINE 5 % EX PTCH: 3.0000 | MEDICATED_PATCH | Freq: Two times a day (BID) | CUTANEOUS | Status: DC

## 2013-01-20 NOTE — Telephone Encounter (Signed)
Ok to refill 

## 2013-01-20 NOTE — Telephone Encounter (Signed)
Notified pt rx sent to pharmacy../lmb 

## 2013-01-20 NOTE — Telephone Encounter (Signed)
Left msg on vm needing refill on her lidoderm patches...Julia Black

## 2013-01-23 ENCOUNTER — Telehealth: Payer: Self-pay | Admitting: *Deleted

## 2013-01-23 MED ORDER — ALPRAZOLAM 0.25 MG PO TABS: 0.2500 mg | ORAL_TABLET | Freq: Every evening | ORAL | Status: DC | PRN

## 2013-01-23 NOTE — Telephone Encounter (Signed)
Rx faxed to [facility].

## 2013-01-23 NOTE — Telephone Encounter (Signed)
ok 

## 2013-01-23 NOTE — Telephone Encounter (Signed)
R'cd fax from [facility] for refill of Alprazolam 0.25mg -last written 10/22/2012 #30 with 0 refills by NP-please advise on refill.

## 2013-01-27 ENCOUNTER — Encounter: Payer: Self-pay | Admitting: Internal Medicine

## 2013-01-27 MED ORDER — HYDROCODONE-ACETAMINOPHEN 5-325 MG PO TABS: 1.0000 | ORAL_TABLET | Freq: Four times a day (QID) | ORAL | Status: DC | PRN

## 2013-02-03 ENCOUNTER — Encounter: Payer: Self-pay | Admitting: Internal Medicine

## 2013-02-03 MED ORDER — HYDROCODONE-ACETAMINOPHEN 5-325 MG PO TABS: 2.0000 | ORAL_TABLET | Freq: Four times a day (QID) | ORAL | Status: DC | PRN

## 2013-02-07 ENCOUNTER — Encounter (HOSPITAL_COMMUNITY): Payer: Self-pay | Admitting: Emergency Medicine

## 2013-02-07 ENCOUNTER — Inpatient Hospital Stay (HOSPITAL_COMMUNITY)
Admission: EM | Admit: 2013-02-07 | Discharge: 2013-02-11 | DRG: 280 | Disposition: A | Discharge status: Home / Self Care | Payer: Medicare Other | Attending: Internal Medicine | Admitting: Internal Medicine

## 2013-02-07 ENCOUNTER — Emergency Department (HOSPITAL_COMMUNITY): Payer: Medicare Other

## 2013-02-07 DIAGNOSIS — Z79899 Other long term (current) drug therapy: Secondary | ICD-10-CM

## 2013-02-07 DIAGNOSIS — Z7982 Long term (current) use of aspirin: Secondary | ICD-10-CM

## 2013-02-07 DIAGNOSIS — G8929 Other chronic pain: Secondary | ICD-10-CM | POA: Diagnosis present

## 2013-02-07 DIAGNOSIS — J9600 Acute respiratory failure, unspecified whether with hypoxia or hypercapnia: Secondary | ICD-10-CM | POA: Diagnosis present

## 2013-02-07 DIAGNOSIS — J90 Pleural effusion, not elsewhere classified: Secondary | ICD-10-CM

## 2013-02-07 DIAGNOSIS — I5023 Acute on chronic systolic (congestive) heart failure: Secondary | ICD-10-CM | POA: Diagnosis present

## 2013-02-07 DIAGNOSIS — I4891 Unspecified atrial fibrillation: Secondary | ICD-10-CM | POA: Diagnosis present

## 2013-02-07 DIAGNOSIS — I10 Essential (primary) hypertension: Secondary | ICD-10-CM | POA: Diagnosis present

## 2013-02-07 DIAGNOSIS — E785 Hyperlipidemia, unspecified: Secondary | ICD-10-CM | POA: Diagnosis present

## 2013-02-07 DIAGNOSIS — I509 Heart failure, unspecified: Secondary | ICD-10-CM | POA: Diagnosis present

## 2013-02-07 DIAGNOSIS — IMO0002 Reserved for concepts with insufficient information to code with codable children: Secondary | ICD-10-CM

## 2013-02-07 DIAGNOSIS — D696 Thrombocytopenia, unspecified: Secondary | ICD-10-CM | POA: Diagnosis present

## 2013-02-07 DIAGNOSIS — Z951 Presence of aortocoronary bypass graft: Secondary | ICD-10-CM

## 2013-02-07 DIAGNOSIS — R4181 Age-related cognitive decline: Secondary | ICD-10-CM | POA: Diagnosis present

## 2013-02-07 DIAGNOSIS — I214 Non-ST elevation (NSTEMI) myocardial infarction: Principal | ICD-10-CM | POA: Diagnosis present

## 2013-02-07 DIAGNOSIS — I2510 Atherosclerotic heart disease of native coronary artery without angina pectoris: Secondary | ICD-10-CM | POA: Diagnosis present

## 2013-02-07 DIAGNOSIS — M545 Low back pain, unspecified: Secondary | ICD-10-CM | POA: Diagnosis present

## 2013-02-07 DIAGNOSIS — E440 Moderate protein-calorie malnutrition: Secondary | ICD-10-CM | POA: Diagnosis present

## 2013-02-07 DIAGNOSIS — Z87891 Personal history of nicotine dependence: Secondary | ICD-10-CM

## 2013-02-07 DIAGNOSIS — I5181 Takotsubo syndrome: Secondary | ICD-10-CM | POA: Diagnosis present

## 2013-02-07 DIAGNOSIS — M129 Arthropathy, unspecified: Secondary | ICD-10-CM | POA: Diagnosis present

## 2013-02-07 DIAGNOSIS — R0602 Shortness of breath: Secondary | ICD-10-CM

## 2013-02-07 DIAGNOSIS — K219 Gastro-esophageal reflux disease without esophagitis: Secondary | ICD-10-CM | POA: Diagnosis present

## 2013-02-07 DIAGNOSIS — I959 Hypotension, unspecified: Secondary | ICD-10-CM | POA: Diagnosis not present

## 2013-02-07 LAB — BASIC METABOLIC PANEL
Chloride: 103 mEq/L (ref 96–112)
Creatinine, Ser: 0.72 mg/dL (ref 0.50–1.10)
GFR calc Af Amer: 84 mL/min — ABNORMAL LOW (ref >90)
Potassium: 4.7 mEq/L (ref 3.5–5.1)
Sodium: 140 mEq/L (ref 135–145)

## 2013-02-07 LAB — CBC WITH DIFFERENTIAL/PLATELET
Basophils Absolute: 0 10*3/uL (ref 0.0–0.1)
Basophils Relative: 0 % (ref 0–1)
MCHC: 31.3 g/dL (ref 30.0–36.0)
Monocytes Absolute: 0.3 10*3/uL (ref 0.1–1.0)
Neutro Abs: 7.7 10*3/uL (ref 1.7–7.7)
Neutrophils Relative %: 91 % — ABNORMAL HIGH (ref 43–77)
Platelets: 127 10*3/uL — ABNORMAL LOW (ref 150–400)
RDW: 14 % (ref 11.5–15.5)

## 2013-02-07 LAB — TROPONIN I: Troponin I: 1.42 ng/mL (ref <0.30)

## 2013-02-07 LAB — PRO B NATRIURETIC PEPTIDE: Pro B Natriuretic peptide (BNP): 1264 pg/mL — ABNORMAL HIGH (ref 0–450)

## 2013-02-07 MED ORDER — ASPIRIN EC 81 MG PO TBEC: 81.0000 mg | DELAYED_RELEASE_TABLET | Freq: Every day | ORAL | Status: DC

## 2013-02-07 MED ORDER — SODIUM CHLORIDE 0.9 % IJ SOLN: 3.0000 mL | INTRAMUSCULAR | Status: DC | PRN

## 2013-02-07 MED ORDER — SODIUM CHLORIDE 0.9 % IJ SOLN: 3.0000 mL | Freq: Two times a day (BID) | INTRAMUSCULAR | Status: DC

## 2013-02-07 MED ORDER — NITROGLYCERIN 0.4 MG SL SUBL: 0.4000 mg | SUBLINGUAL_TABLET | SUBLINGUAL | Status: DC | PRN

## 2013-02-07 MED ORDER — ACETAMINOPHEN 325 MG PO TABS: 650.0000 mg | ORAL_TABLET | ORAL | Status: DC | PRN

## 2013-02-07 MED ORDER — ASPIRIN 300 MG RE SUPP: 300.0000 mg | RECTAL | Status: DC

## 2013-02-07 MED ORDER — CLOPIDOGREL BISULFATE 300 MG PO TABS: 300.0000 mg | ORAL_TABLET | Freq: Once | ORAL | Status: DC

## 2013-02-07 MED ORDER — ASPIRIN 81 MG PO CHEW: 324.0000 mg | CHEWABLE_TABLET | ORAL | Status: AC

## 2013-02-07 MED ORDER — ATORVASTATIN CALCIUM 40 MG PO TABS: 40.0000 mg | ORAL_TABLET | Freq: Every day | ORAL | Status: DC

## 2013-02-07 MED ORDER — SODIUM CHLORIDE 0.9 % IV SOLN: 250.0000 mL | INTRAVENOUS | Status: DC | PRN

## 2013-02-07 MED ORDER — MORPHINE SULFATE 2 MG/ML IJ SOLN: 1.0000 mg | Freq: Once | INTRAMUSCULAR | Status: DC

## 2013-02-07 MED ORDER — HEPARIN (PORCINE) IN NACL 100-0.45 UNIT/ML-% IJ SOLN
700.0000 [IU]/h | INTRAMUSCULAR | Status: DC
  Administered 2013-02-08: 600 [IU]/h via INTRAVENOUS
  Administered 2013-02-08: 800 [IU]/h via INTRAVENOUS
  Administered 2013-02-08: 900 [IU]/h via INTRAVENOUS
  Administered 2013-02-10 (×2): 700 [IU]/h via INTRAVENOUS
  Filled 2013-02-07 (×4): qty 250

## 2013-02-07 MED ORDER — ONDANSETRON HCL 4 MG/2ML IJ SOLN: 4.0000 mg | Freq: Four times a day (QID) | INTRAMUSCULAR | Status: DC | PRN

## 2013-02-07 MED ORDER — FUROSEMIDE 10 MG/ML IJ SOLN: 40.0000 mg | Freq: Once | INTRAMUSCULAR | Status: DC

## 2013-02-07 NOTE — Progress Notes (Signed)
ANTICOAGULATION CONSULT NOTE - Initial Consult  Pharmacy Consult for  heparin Indication: chest pain/ACS  Allergies  Allergen Reactions  . Amoxicillin Nausea Only    Upset stomach with high dose  . Sporanox (Itraconazole) Hives  . Sulfa Antibiotics Nausea Only    Patient Measurements: weight 52 kg, height 62 inches (per patient)   Heparin Dosing Weight: 52 kg  Vital Signs: BP: 101/69 mmHg (08/01 2059) Pulse Rate: 95 (08/01 2059)  Labs:  Recent Labs  02/07/13 1759  HGB 10.9*  HCT 34.8*  PLT 127*  CREATININE 0.72  TROPONINI 1.42*    The CrCl is unknown because both a height and weight (above a minimum accepted value) are required for this calculation.   Medical History: Past Medical History  Diagnosis Date  . SPINAL STENOSIS   . LOW BACK PAIN     chronic, follows with Nsurg for ESI  . FRACTURE, PELVIS, RIGHT 12/2009  . HIP FRACTURE, LEFT 09/2009    s/p L THA  . ALLERGIC RHINITIS   . CHF (congestive heart failure)   . Carpal tunnel syndrome   . Hypertension   . HYPERLIPIDEMIA   . GERD   . Arthritis   . Osteoporosis   . CORONARY ARTERY DISEASE 2004    s/p CABG  . Atrial fibrillation     no anticoag due to fall risk/age  . DEPRESSION     Medications:  See med rec  Assessment: Patient is a 79 y.o F who was sent to the ED from the outpatient surgical center for further workup of SOB.  She was there for epidural steroid injection with dose given today. Now with elevated troponin.  Patient reported that she fell about 4 weeks ago with skin tore off her lower right leg.  She stated she had some sort of "skin graft" procedure about 4 week ago for that area.    Spoke to Dr. Lovell Sheehan (on call for Dr. Danielle Dess)-- recom no bolus for heparin and to aim for lower end of therapeutic range.  Goal of Therapy:  Heparin level = 0.3-0.5 Monitor platelets by anticoagulation protocol: Yes   Plan:  1) heparin drip at  600 units/hr (no bolus) 2) check 8 hour heparin  level   Janette Harvie P 02/07/2013,10:17 PM

## 2013-02-07 NOTE — H&P (Signed)
PCP:   Rene Paci, MD   Cardiologist: Dr. Thurmon Fair with [facility] cardiology -  however patient's daughter requested patient to be seen by somebody from [facility] cardiology preferably Dr. Roque Lias ??   Chief Complaint: SOB  HPI:  this is a 79 year old Caucasian female with the past medical history of coronary artery disease, status post CABG 10 years ago his, hyperlipidemia, hypertension who presented for further evaluation of shortness of breath.    Apparently patient has been short of breath with minimal amount of exertion as walking to the bathroom at somewhat faster pace, dressing herself to loss month. She also was struggling with lower back pain for which she has been getting intermittent epidural injections. Patient reported the chest pain exacerbated her shortness of breath. Patient denied any sort of chest discomfort. Her anginal a coolant at the time of her CABG was discomfort in the left shoulder. She also didn't have a last month. On the day of presentation to the emergency department patient had peripheral injection prior to that she had an episode of shortness of breath that lasted for about 45 minutes, and didn't go away with rest. She was found to be mildly hypotensive with blood pressure around 18 mm mercury in the outpatient office. She received 500 cc of normal saline which made her hypoxic and more short of breath, her oxygen saturation was about 70% per emergency department report. Therefore patient was sent to the emergency room for further evaluation. Patient received 20 mg of IV Lasix at that point of time. And she continued with the marginally low blood pressures down to 80 mm mercury systolic as well as she was found to have on troponin elevation up to 1.5 on the first set..  At the moment of fibrillation pacing shortness of breath has improved significantly.  Review of Systems: Patient reported having burning with urination  The patient  otherwise denies  anorexia, fever, weight loss,vision loss, decreased hearing, hoarseness, chest pain, syncope, peripheral edema, balance deficits, hemoptysis, abdominal pain, melena, hematochezia, severe indigestion/heartburn, hematuria, incontinence, genital sores, muscle weakness, suspicious skin lesions, transient blindness, difficulty walking, depression, unusual weight change, abnormal bleeding, enlarged lymph nodes, angioedema, and breast masses.  Past Medical History: Past Medical History  Diagnosis Date  . SPINAL STENOSIS   . LOW BACK PAIN     chronic, follows with Nsurg for ESI  . FRACTURE, PELVIS, RIGHT 12/2009  . HIP FRACTURE, LEFT 09/2009    s/p L THA  . ALLERGIC RHINITIS   . CHF (congestive heart failure)   . Carpal tunnel syndrome   . Hypertension   . HYPERLIPIDEMIA   . GERD   . Arthritis   . Osteoporosis   . CORONARY ARTERY DISEASE 2004    s/p CABG  . Atrial fibrillation     no anticoag due to fall risk/age  . DEPRESSION    Past Surgical History  Procedure Laterality Date  . (l) hip hemiarthroplasty  09/2009  . Appendectomy  1940  . Eye surgery      both cataracts  . Coronary artery bypass graft  2004  . Carpal tunnel release  2004    right and left-dsc  . Ulnar nerve transposition  2009    lt   . Vein ligation      legs  . Colonoscopy    . I&d extremity Right 12/26/2012    Procedure: IRRIGATION AND DEBRIDEMENT OF RIGHT LEG, SURGICAL PREP PLACEMENT OF A CELL AND VAC   ;  Surgeon:  Claire Sanger, DO;  Location: [facility];  Service: Plastics;  Laterality: Right;    Medications: Prior to Admission medications   Medication Sig Start Date End Date Taking? Authorizing Provider  Ascorbic Acid (VITAMIN C PO) Take 1 tablet by mouth daily.   Yes Historical Provider, MD  aspirin 81 MG tablet Take 81 mg by mouth every morning.    Yes Historical Provider, MD  benzonatate (TESSALON) 200 MG capsule Take 200 mg by mouth every morning.   Yes Historical Provider, MD  Calcium  Carbonate-Vitamin D 600-400 MG-UNIT per tablet Take 1 tablet by mouth daily.     Yes Historical Provider, MD  carvedilol (COREG) 25 MG tablet TAKE 1/2 TABLET TWICE DAILY 12/09/12  Yes Newt Lukes, MD  Cholecalciferol (VITAMIN D PO) Take 1 tablet by mouth daily.   Yes Historical Provider, MD  diltiazem (CARDIZEM CD) 180 MG 24 hr capsule Take 180 mg by mouth daily.   Yes Historical Provider, MD  escitalopram (LEXAPRO) 10 MG tablet Take 10 mg by mouth daily.   Yes Historical Provider, MD  furosemide (LASIX) 40 MG tablet Take 1 tablet (40 mg total) by mouth daily as needed. 12/31/12  Yes Newt Lukes, MD  guaiFENesin (MUCINEX) 600 MG 12 hr tablet Take 600 mg by mouth 2 (two) times daily.   Yes Historical Provider, MD  HYDROCODONE-ACETAMINOPHEN PO Take 1 tablet by mouth every 6 (six) hours as needed (for pain).   Yes Historical Provider, MD  Multiple Vitamin (MULTIVITAMIN WITH MINERALS) TABS Take 1 tablet by mouth daily.   Yes Historical Provider, MD  Omega-3 Fatty Acids (FISH OIL) 1000 MG CAPS Take by mouth daily.     Yes Historical Provider, MD  omeprazole (PRILOSEC) 20 MG capsule TAKE ONE CAPSULE EACH DAY 12/09/12  Yes Newt Lukes, MD  polyethylene glycol (MIRALAX / GLYCOLAX) packet Take 17 g by mouth every morning.   Yes Historical Provider, MD  Probiotic Product (ALIGN) 4 MG CAPS Take 4 mg by mouth every morning.   Yes Historical Provider, MD  quinapril (ACCUPRIL) 20 MG tablet Take 20 mg by mouth daily.   Yes Historical Provider, MD  quinapril (ACCUPRIL) 5 MG tablet Take 5 mg by mouth at bedtime.   Yes Historical Provider, MD  simvastatin (ZOCOR) 40 MG tablet TAKE ONE TABLET AT BEDTIME 11/16/12  Yes Nicki Reaper, NP  traMADol (ULTRAM) 50 MG tablet Take 0.5-1 tablets (25-50 mg total) by mouth every 8 (eight) hours as needed for pain. 12/11/12  Yes Georgina Quint Plotnikov, MD  venlafaxine XR (EFFEXOR-XR) 75 MG 24 hr capsule TAKE ONE CAPSULE EACH DAY 11/16/12  Yes Nicki Reaper, NP  zolpidem  (AMBIEN) 10 MG tablet Take 10 mg by mouth at bedtime as needed for sleep.   Yes Historical Provider, MD  ALPRAZolam (XANAX) 0.25 MG tablet Take 1 tablet (0.25 mg total) by mouth at bedtime as needed for sleep or anxiety. 01/23/13   Newt Lukes, MD  Ascorbic Acid (VITAMIN C) 500 MG tablet Take 500 mg by mouth daily.      Historical Provider, MD  Cholecalciferol (VITAMIN D3) 1000 UNITS CAPS Take by mouth daily.      Historical Provider, MD  docusate sodium (COLACE) 100 MG capsule Take 100 mg by mouth 2 (two) times daily.    Historical Provider, MD  fentaNYL (DURAGESIC) 50 MCG/HR Place 1 patch (50 mcg total) onto the skin every 3 (three) days. 12/31/12 12/31/13  Newt Lukes, MD  hydrochlorothiazide (  MICROZIDE) 12.5 MG capsule TAKE ONE CAPSULE ON MONDAY, WEDNESDAY, AND FRIDAY 01/02/13   Newt Lukes, MD  HYDROcodone-acetaminophen (NORCO/VICODIN) 5-325 MG per tablet Take 2 tablets by mouth every 6 (six) hours as needed for pain. 02/03/13   Newt Lukes, MD  lidocaine (LIDODERM) 5 % Place 3 patches onto the skin every 12 (twelve) hours. Remove & Discard patch within 12 hours or as directed by MD 01/20/13 01/20/14  Newt Lukes, MD  LYRICA 50 MG capsule TAKE ONE CAPSULE THREE TIMES DAILY 10/08/12   Etta Grandchild, MD  Multiple Vitamins-Minerals ([employer] SPECTRAVITE PO) Take by mouth daily.      Historical Provider, MD  nitroGLYCERIN (NITROSTAT) 0.6 MG SL tablet Place 0.6 mg under the tongue every 5 (five) minutes as needed.      Historical Provider, MD  polyethylene glycol (MIRALAX / GLYCOLAX) packet Take 17 g by mouth daily.      Historical Provider, MD  potassium chloride SA (K-DUR,KLOR-CON) 20 MEQ tablet TAKE ONE TABLET DAILY AS NEEDED 11/04/12   Newt Lukes, MD  zolpidem (AMBIEN) 5 MG tablet TAKE ONE TABLET AT BEDTIME AS NEEDED 01/06/13   Newt Lukes, MD    Allergies:   Allergies  Allergen Reactions  . Amoxicillin Nausea Only    Upset stomach with high dose  .  Sporanox (Itraconazole) Hives  . Sulfa Antibiotics Nausea Only    Social History:  reports that she quit smoking about 34 years ago. She has never used smokeless tobacco. She reports that she does not drink alcohol or use illicit drugs.  Family History: Family History  Problem Relation Age of Onset  . Arthritis Mother   . Breast cancer Mother   . Heart disease Mother   . Hypertension Mother   . Arthritis Father   . Heart disease Father   . Hypertension Father   . Arthritis Maternal Grandmother   . Arthritis Maternal Grandfather   . Arthritis Paternal Grandmother   . Arthritis Paternal Grandfather   . Breast cancer Daughter     Physical Exam: Filed Vitals:   02/07/13 1656 02/07/13 1747 02/07/13 1840 02/07/13 2059  BP: 88/40 128/105 88/51 101/69  Pulse:  90  95  Resp:    25  SpO2:    100%   General:  Well appearing. No respiratory difficulty HEENT: normal Neck: supple. JVD 4 cm above the clavicle with patient sitting upright, positive hepatojugular reflux. Carotids 2+ bilat; no bruits. No lymphadenopathy or thryomegaly appreciated. Cor: PMI nondisplaced. Regular rate & rhythm. No rubs, gallops or murmurs. Lungs: Mild bibasilar crackles Abdomen: soft, nontender, nondistended. No hepatosplenomegaly. No bruits or masses. Good bowel sounds. Extremities: no cyanosis, clubbing, rash, edema, well perfused Neuro: alert & oriented x 3, cranial nerves grossly intact. moves all 4 extremities w/o difficulty. Affect pleasant.   Labs on Admission:   Recent Labs  02/07/13 1759  NA 140  K 4.7  CL 103  CO2 28  GLUCOSE 118*  BUN 23  CREATININE 0.72  CALCIUM 9.4   No results found for this basename: AST, ALT, ALKPHOS, BILITOT, PROT, ALBUMIN,  in the last 72 hours No results found for this basename: LIPASE, AMYLASE,  in the last 72 hours  Recent Labs  02/07/13 1759  WBC 8.6  NEUTROABS 7.7  HGB 10.9*  HCT 34.8*  MCV 91.6  PLT 127*    Recent Labs  02/07/13 1759   TROPONINI 1.42*   Radiological Exams on Admission: Dg Chest Port 1  View  02/07/2013   *RADIOLOGY REPORT*  Clinical Data: Respiratory distress.  Shortness of breath.  PORTABLE CHEST - 1 VIEW  Comparison: 08/12/2012.  Findings: The heart is enlarged.  Stable surgical changes from bypass surgery.  There is interstitial pulmonary edema and probable small bilateral pleural effusions consistent with CHF.  IMPRESSION: CHF.   Original Report Authenticated By: Rudie Meyer, M.D.    EKG: Showed normal sinus rhythm didn't show any concerning ST-T changes.   Assessment/Plan This is a 79 year old Caucasian female who presented with the resting episode of shortness of breath and symptoms of congestive heart failure. Patient has been having shortness of breath with minimal exertion for the last month which was significant change compared to her baseline. She doesn't have any EKG changes concerning for acute ischemia at the moment of my evaluation however she did have elevated troponin to 1.5 with no good alternative explanation. This also seems to be out of proportion for what to expect with just congestive heart failure exacerbation.  My overall impression is: UNSTEMI with diastolic/systolic CHF.  Problem list: #1 unSTEMI #2 CHF #3 hypertension now with hypotension #4 hyperlipidemia  #5 burning with urination  Problem list/plan #1 unSTEMI Heparin drip full dose Lipitor 40 mg daily Aspirin fluids one time and 81 mg daily We'll give 300 mg of Plavix x1 Continue cycling cardiac enzymes The patient was made aware that she may require left heart catheterization on Monday, she seems to be in agreement with this plan  #2 CHF Unclear if diastolic secondary to ischemia or systolic secondary to myocardial infarction that occurred prior to presentation Give 20 mg of Lasix IV and monitor response Strict ins and outs No ACE inhibitors, beta blockers, spironolactone secondary to hypotension  #3 hypertension  now with hypotension Will hold patient's HCTZ, carvedilol secondary to hypotension Will plan on restarting beta blocker as soon as possible  #4 hyperlipidemia  Lipitor 40 mg daily A lipid profile in the morning TSH  #5 burning with urination We'll obtain urinalysis   Disposition: stepp-down level of care secondary to hypotension, however patient will likely be able to transfer to regular telemetry floor tomorrow. DVT prophylaxis: Full dose heparin GI prophylaxis: PPI CODE STATUS, full code confirmed with patient and her family  Nolon Nations 02/07/2013, 9:29 PM

## 2013-02-07 NOTE — ED Provider Notes (Signed)
CSN: [id number]     Arrival date & time 02/07/13  1637 History     First MD Initiated Contact with Patient 02/07/13 1639     Chief Complaint  Patient presents with  . Shortness of Breath   (Consider location/radiation/quality/duration/timing/severity/associated sxs/prior Treatment) Patient is a 79 y.o. female presenting with shortness of breath. The history is provided by the patient, medical records and a caregiver.  Shortness of Breath Severity:  Severe Onset quality:  Sudden Timing:  Constant Progression:  Improving Chronicity:  New Relieved by: lasix and bipap. Exacerbated by: IV fluids. Ineffective treatments:  None tried Associated symptoms: no abdominal pain, no chest pain, no cough, no diaphoresis, no ear pain, no fever, no headaches, no neck pain, no rash, no sore throat, no vomiting and no wheezing     Past Medical History  Diagnosis Date  . SPINAL STENOSIS   . LOW BACK PAIN     chronic, follows with Nsurg for ESI  . FRACTURE, PELVIS, RIGHT 12/2009  . HIP FRACTURE, LEFT 09/2009    s/p L THA  . ALLERGIC RHINITIS   . CHF (congestive heart failure)   . Carpal tunnel syndrome   . Hypertension   . HYPERLIPIDEMIA   . GERD   . Arthritis   . Osteoporosis   . CORONARY ARTERY DISEASE 2004    s/p CABG  . Atrial fibrillation     no anticoag due to fall risk/age  . DEPRESSION    Past Surgical History  Procedure Laterality Date  . (l) hip hemiarthroplasty  09/2009  . Appendectomy  1940  . Eye surgery      both cataracts  . Coronary artery bypass graft  2004  . Carpal tunnel release  2004    right and left-dsc  . Ulnar nerve transposition  2009    lt   . Vein ligation      legs  . Colonoscopy    . I&d extremity Right 12/26/2012    Procedure: IRRIGATION AND DEBRIDEMENT OF RIGHT LEG, SURGICAL PREP PLACEMENT OF A CELL AND VAC   ;  Surgeon: Wayland Denis, DO;  Location: [facility];  Service: Plastics;  Laterality: Right;   Family History  Problem  Relation Age of Onset  . Arthritis Mother   . Breast cancer Mother   . Heart disease Mother   . Hypertension Mother   . Arthritis Father   . Heart disease Father   . Hypertension Father   . Arthritis Maternal Grandmother   . Arthritis Maternal Grandfather   . Arthritis Paternal Grandmother   . Arthritis Paternal Grandfather   . Breast cancer Daughter    History  Substance Use Topics  . Smoking status: Former Smoker    Quit date: 07/10/1978  . Smokeless tobacco: Never Used     Comment: Lives alone in indep living facility @ [place]. Active ADLS but does not drive dur to macular degen. supportive family members nearby  . Alcohol Use: No   OB History   Grav Para Term Preterm Abortions TAB SAB Ect Mult Living                 Review of Systems  Constitutional: Negative for fever, chills, diaphoresis and fatigue.  HENT: Negative for ear pain, congestion, sore throat, facial swelling, mouth sores, trouble swallowing, neck pain and neck stiffness.   Eyes: Negative.   Respiratory: Positive for shortness of breath. Negative for apnea, cough, chest tightness and wheezing.   Cardiovascular: Negative for  chest pain, palpitations and leg swelling.  Gastrointestinal: Negative for nausea, vomiting, abdominal pain, diarrhea and abdominal distention.  Genitourinary: Negative for hematuria, flank pain, vaginal discharge, difficulty urinating and menstrual problem.  Musculoskeletal: Positive for back pain. Negative for gait problem.  Skin: Negative for rash and wound.  Neurological: Negative for dizziness, tremors, seizures, syncope, facial asymmetry, numbness and headaches.  Psychiatric/Behavioral: Negative.   All other systems reviewed and are negative.    Allergies  Amoxicillin; Sporanox; and Sulfa antibiotics  Home Medications   Current Outpatient Rx  Name  Route  Sig  Dispense  Refill  . Ascorbic Acid (VITAMIN C PO)   Oral   Take 1 tablet by mouth daily.         [name] aspirin  81 MG tablet   Oral   Take 81 mg by mouth every morning.          . Calcium Carbonate-Vitamin D 600-400 MG-UNIT per tablet   Oral   Take 1 tablet by mouth daily.           . carvedilol (COREG) 25 MG tablet      TAKE 1/2 TABLET TWICE DAILY   30 tablet   5   . Cholecalciferol (VITAMIN D PO)   Oral   Take 1 tablet by mouth daily.         . Cholecalciferol (VITAMIN D3) 1000 UNITS CAPS   Oral   Take by mouth daily.           [name] docusate sodium (COLACE) 100 MG capsule   Oral   Take 100 mg by mouth daily.          . fentaNYL (DURAGESIC) 50 MCG/HR   Transdermal   Place 1 patch (50 mcg total) onto the skin every 3 (three) days.   10 patch   0   . furosemide (LASIX) 40 MG tablet   Oral   Take 40 mg by mouth daily as needed for fluid or edema.    60 tablet   5   . guaiFENesin (MUCINEX) 600 MG 12 hr tablet   Oral   Take 600 mg by mouth 2 (two) times daily.         [name] HYDROcodone-acetaminophen (NORCO/VICODIN) 5-325 MG per tablet   Oral   Take 2 tablets by mouth every 6 (six) hours as needed for pain.   60 tablet   1   . HYDROCODONE-ACETAMINOPHEN PO   Oral   Take 1 tablet by mouth every 6 (six) hours as needed (for pain).         . Multiple Vitamin (MULTIVITAMIN WITH MINERALS) TABS   Oral   Take 1 tablet by mouth daily.         . Omega-3 Fatty Acids (FISH OIL) 1000 MG CAPS   Oral   Take by mouth daily.           [name] omeprazole (PRILOSEC) 20 MG capsule      TAKE ONE CAPSULE EACH DAY   30 capsule   5   . polyethylene glycol (MIRALAX / GLYCOLAX) packet   Oral   Take 17 g by mouth every morning.         . Probiotic Product (ALIGN) 4 MG CAPS   Oral   Take 4 mg by mouth every morning.         . quinapril (ACCUPRIL) 20 MG tablet   Oral   Take 20 mg by mouth daily.         [name]  quinapril (ACCUPRIL) 5 MG tablet   Oral   Take 5 mg by mouth at bedtime.         . simvastatin (ZOCOR) 40 MG tablet      TAKE ONE TABLET AT BEDTIME   30  tablet   2   . traMADol (ULTRAM) 50 MG tablet   Oral   Take 0.5-1 tablets (25-50 mg total) by mouth every 8 (eight) hours as needed for pain.   90 tablet   1   . venlafaxine XR (EFFEXOR-XR) 75 MG 24 hr capsule      TAKE ONE CAPSULE EACH DAY   30 capsule   2   . vitamin C (ASCORBIC ACID) 500 MG tablet   Oral   Take 500 mg by mouth at bedtime.         [name] zolpidem (AMBIEN) 10 MG tablet   Oral   Take 10 mg by mouth at bedtime as needed for sleep.         [name] ALPRAZolam (XANAX) 0.25 MG tablet   Oral   Take 1 tablet (0.25 mg total) by mouth at bedtime as needed for sleep or anxiety.   30 tablet   5   . Ascorbic Acid (VITAMIN C) 500 MG tablet   Oral   Take 500 mg by mouth daily.           . hydrochlorothiazide (MICROZIDE) 12.5 MG capsule      TAKE ONE CAPSULE ON MONDAY, WEDNESDAY, AND FRIDAY   15 capsule   5   . lidocaine (LIDODERM) 5 %   Transdermal   Place 3 patches onto the skin every 12 (twelve) hours. Remove & Discard patch within 12 hours or as directed by MD   90 patch   3   . LYRICA 50 MG capsule      TAKE ONE CAPSULE THREE TIMES DAILY   90 capsule   5   . Multiple Vitamins-Minerals ([employer]S SPECTRAVITE PO)   Oral   Take by mouth daily.           . nitroGLYCERIN (NITROSTAT) 0.6 MG SL tablet   Sublingual   Place 0.6 mg under the tongue every 5 (five) minutes as needed.           . polyethylene glycol (MIRALAX / GLYCOLAX) packet   Oral   Take 17 g by mouth daily.           . potassium chloride SA (K-DUR,KLOR-CON) 20 MEQ tablet      TAKE ONE TABLET DAILY AS NEEDED   30 tablet   2   . zolpidem (AMBIEN) 5 MG tablet      TAKE ONE TABLET AT BEDTIME AS NEEDED   30 tablet   5    BP 101/69  Pulse 95  Resp 25  SpO2 100% Physical Exam  Nursing note and vitals reviewed. Constitutional: She is oriented to person, place, and time. She appears well-developed and well-nourished. No distress.  HENT:  Head: Normocephalic and atraumatic.  Right  Ear: External ear normal.  Left Ear: External ear normal.  Nose: Nose normal.  Mouth/Throat: Oropharynx is clear and moist. No oropharyngeal exudate.  Eyes: Conjunctivae and EOM are normal. Pupils are equal, round, and reactive to light. Right eye exhibits no discharge. Left eye exhibits no discharge.  Neck: Normal range of motion. Neck supple. No JVD present. No tracheal deviation present. No thyromegaly present.  Cardiovascular: Normal rate, regular rhythm, normal heart sounds and intact distal pulses.  Exam reveals no gallop and no friction rub.   No murmur heard. Pulmonary/Chest: Effort normal. No respiratory distress. She has no wheezes. She has rales (bilateral to the middle of the back). She exhibits no tenderness.  Abdominal: Soft. Bowel sounds are normal. She exhibits no distension. There is no tenderness. There is no rebound and no guarding.  Musculoskeletal: Normal range of motion.  Lymphadenopathy:    She has no cervical adenopathy.  Neurological: She is alert and oriented to person, place, and time. No cranial nerve deficit. Coordination normal.  Skin: Skin is warm. No rash noted. She is not diaphoretic.  Psychiatric: She has a normal mood and affect. Her behavior is normal. Judgment and thought content normal.    ED Course   Procedures (including critical care time)  Labs Reviewed  TROPONIN I - Abnormal; Notable for the following:    Troponin I 1.42 (*)    All other components within normal limits  PRO B NATRIURETIC PEPTIDE - Abnormal; Notable for the following:    Pro B Natriuretic peptide (BNP) 1264.0 (*)    All other components within normal limits  CBC WITH DIFFERENTIAL - Abnormal; Notable for the following:    RBC 3.80 (*)    Hemoglobin 10.9 (*)    HCT 34.8 (*)    Platelets 127 (*)    Neutrophils Relative % 91 (*)    Lymphocytes Relative 6 (*)    Lymphs Abs 0.5 (*)    All other components within normal limits  BASIC METABOLIC PANEL - Abnormal; Notable for the  following:    Glucose, Bld 118 (*)    GFR calc non Af Amer 72 (*)    GFR calc Af Amer 84 (*)    All other components within normal limits   Dg Chest Port 1 View  02/07/2013   *RADIOLOGY REPORT*  Clinical Data: Respiratory distress.  Shortness of breath.  PORTABLE CHEST - 1 VIEW  Comparison: 08/12/2012.  Findings: The heart is enlarged.  Stable surgical changes from bypass surgery.  There is interstitial pulmonary edema and probable small bilateral pleural effusions consistent with CHF.  IMPRESSION: CHF.   Original Report Authenticated By: Rudie Meyer, M.D.   No diagnosis found.  MDM  Pt sent here from spine center as she has ongoing back pain for which she was getting steroid injection. While there she got 500 cc bolus and has hx of heart failure. She got hypoxic, AMS, and short of breath with tachypnea after getting that fluid. Patient given 20 lasix and nitroglycerin then placed on bipap and sent here. Here she is satting 94% with bipap on 50% O2. Will continue to diurese. Likely with exacerbation of CHF. Will check BNP, troponin. EKG is normal. NO fever, not likely and infection  Patient with ongoing low blood pressure but normal mental status. Patient with elevated BNP and troponin. Spoke with cardiology who would like to admit to stepdown for possible central access and pressors for possible cardiogenic shock.  Case discussed with Dr. Ermalene Postin, MD 02/07/13 2139

## 2013-02-07 NOTE — ED Notes (Signed)
Pt was at the MD office for steroid injection into her back for pain mgmt. Pt then became sob , ems placed patient on cpap and gave one nitro

## 2013-02-07 NOTE — ED Notes (Signed)
The pt remains alert and  She has no pain.  Her bp is jumping around

## 2013-02-07 NOTE — Consult Note (Signed)
Reason for Consult: Status post epidural injection with shortness of breath Referring Physician: Dr. Richarda Overlie is an 79 y.o. female.  HPI: Patient is a 79 year old individual who has had intermittent epidural steroid injections by me for last 10 years.  She was seen in the [facility] today for a translaminar epidural injection. At the time of her initial evaluation she complained of back pain quite bitterly. During ambulation to the bathroom she was a bit unsteady and requires some help. She became easily short of breath at that time it was noted that her sat on room air was 88 and she was breathing approximately 35 times per minute. By lying back on the stretcher she settled down to a more normal respiratory rate. She was also noted to be moderately hypertensive. We proceeded with her epidural steroid injection which she seemed to tolerate well. After the injection however she complained of persistent back pain at the injection sites, and she became progressively more short of breath. A chest x-ray at the surgical Center and demonstrated some mild patchy infiltrates. She was placed on 5 L of oxygen her O2 sat decreased to the high 70s on room air but came back to the 90s on 5 L O2. It was then decided to transfer her to the emergency room for further evaluation and concerns that she may have some mild congestive failure.  Past Medical History  Diagnosis Date  . SPINAL STENOSIS   . LOW BACK PAIN     chronic, follows with Nsurg for ESI  . FRACTURE, PELVIS, RIGHT 12/2009  . HIP FRACTURE, LEFT 09/2009    s/p L THA  . ALLERGIC RHINITIS   . CHF (congestive heart failure)   . Carpal tunnel syndrome   . Hypertension   . HYPERLIPIDEMIA   . GERD   . Arthritis   . Osteoporosis   . CORONARY ARTERY DISEASE 2004    s/p CABG  . Atrial fibrillation     no anticoag due to fall risk/age  . DEPRESSION     Past Surgical History  Procedure Laterality Date  . (l) hip  hemiarthroplasty  09/2009  . Appendectomy  1940  . Eye surgery      both cataracts  . Coronary artery bypass graft  2004  . Carpal tunnel release  2004    right and left-dsc  . Ulnar nerve transposition  2009    lt   . Vein ligation      legs  . Colonoscopy    . I&d extremity Right 12/26/2012    Procedure: IRRIGATION AND DEBRIDEMENT OF RIGHT LEG, SURGICAL PREP PLACEMENT OF A CELL AND VAC   ;  Surgeon: Wayland Denis, DO;  Location: [facility];  Service: Plastics;  Laterality: Right;    Family History  Problem Relation Age of Onset  . Arthritis Mother   . Breast cancer Mother   . Heart disease Mother   . Hypertension Mother   . Arthritis Father   . Heart disease Father   . Hypertension Father   . Arthritis Maternal Grandmother   . Arthritis Maternal Grandfather   . Arthritis Paternal Grandmother   . Arthritis Paternal Grandfather   . Breast cancer Daughter     Social History:  reports that she quit smoking about 34 years ago. She has never used smokeless tobacco. She reports that she does not drink alcohol or use illicit drugs.  Allergies:  Allergies  Allergen Reactions  .  Amoxicillin     Upset stomach with high dose  . Sulfa Antibiotics     ?    Medications: I have reviewed the patient's current medications.  Results for orders placed during the hospital encounter of 02/07/13 (from the past 48 hour(s))  TROPONIN I     Status: Abnormal   Collection Time    02/07/13  5:59 PM      Result Value Range   Troponin I 1.42 (*) <0.30 ng/mL   Comment:            Due to the release kinetics of cTnI,     a negative result within the first hours     of the onset of symptoms does not rule out     myocardial infarction with certainty.     If myocardial infarction is still suspected,     repeat the test at appropriate intervals.     CRITICAL RESULT CALLED TO, READ BACK BY AND VERIFIED WITH:     C GROSE,RN 1838 02/07/13 D BRADLEY  PRO B NATRIURETIC PEPTIDE      Status: Abnormal   Collection Time    02/07/13  5:59 PM      Result Value Range   Pro B Natriuretic peptide (BNP) 1264.0 (*) 0 - 450 pg/mL  CBC WITH DIFFERENTIAL     Status: Abnormal   Collection Time    02/07/13  5:59 PM      Result Value Range   WBC 8.6  4.0 - 10.5 K/uL   RBC 3.80 (*) 3.87 - 5.11 MIL/uL   Hemoglobin 10.9 (*) 12.0 - 15.0 g/dL   HCT 16.1 (*) 09.6 - 04.5 %   MCV 91.6  78.0 - 100.0 fL   MCH 28.7  26.0 - 34.0 pg   MCHC 31.3  30.0 - 36.0 g/dL   RDW 40.9  81.1 - 91.4 %   Platelets 127 (*) 150 - 400 K/uL   Neutrophils Relative % 91 (*) 43 - 77 %   Neutro Abs 7.7  1.7 - 7.7 K/uL   Lymphocytes Relative 6 (*) 12 - 46 %   Lymphs Abs 0.5 (*) 0.7 - 4.0 K/uL   Monocytes Relative 4  3 - 12 %   Monocytes Absolute 0.3  0.1 - 1.0 K/uL   Eosinophils Relative 0  0 - 5 %   Eosinophils Absolute 0.0  0.0 - 0.7 K/uL   Basophils Relative 0  0 - 1 %   Basophils Absolute 0.0  0.0 - 0.1 K/uL  BASIC METABOLIC PANEL     Status: Abnormal   Collection Time    02/07/13  5:59 PM      Result Value Range   Sodium 140  135 - 145 mEq/L   Potassium 4.7  3.5 - 5.1 mEq/L   Chloride 103  96 - 112 mEq/L   CO2 28  19 - 32 mEq/L   Glucose, Bld 118 (*) 70 - 99 mg/dL   BUN 23  6 - 23 mg/dL   Creatinine, Ser 7.82  0.50 - 1.10 mg/dL   Calcium 9.4  8.4 - 95.6 mg/dL   GFR calc non Af Amer 72 (*) >90 mL/min   GFR calc Af Amer 84 (*) >90 mL/min   Comment:            The eGFR has been calculated     using the CKD EPI equation.     This calculation has not been     validated  in all clinical     situations.     eGFR's persistently     <90 mL/min signify     possible Chronic Kidney Disease.    Dg Chest Port 1 View  02/07/2013   *RADIOLOGY REPORT*  Clinical Data: Respiratory distress.  Shortness of breath.  PORTABLE CHEST - 1 VIEW  Comparison: 08/12/2012.  Findings: The heart is enlarged.  Stable surgical changes from bypass surgery.  There is interstitial pulmonary edema and probable small bilateral  pleural effusions consistent with CHF.  IMPRESSION: CHF.   Original Report Authenticated By: Rudie Meyer, M.D.    Review of Systems  Eyes: Negative.   Respiratory: Positive for shortness of breath.   Cardiovascular: Positive for palpitations.  Gastrointestinal: Negative.   Genitourinary: Negative.   Musculoskeletal: Positive for back pain.  Neurological: Positive for weakness.  Endo/Heme/Allergies: Bruises/bleeds easily.  Psychiatric/Behavioral: Negative.    Blood pressure 88/51, pulse 90, SpO2 93.00%. Physical Exam  Constitutional: She is oriented to person, place, and time.  Frail appearing elderly lady with significant ecchymoses around left ankle  HENT:  Head: Normocephalic and atraumatic.  Eyes: Conjunctivae and EOM are normal. Pupils are equal, round, and reactive to light.  Neck: Normal range of motion. Neck supple.  Cardiovascular: Normal rate and regular rhythm.   Respiratory: Effort normal and breath sounds normal.  GI: Soft. Bowel sounds are normal.  Musculoskeletal: She exhibits tenderness.  Tender across lower lumbar spine  Neurological: She is alert and oriented to person, place, and time.  Skin: Skin is warm and dry.  Psychiatric: She has a normal mood and affect. Her behavior is normal. Judgment and thought content normal.    Assessment/Plan: Rule out congestive failure and cardiac instability. Treat back pain with pain medication as needed.  Alfonza Toft J 02/07/2013, 7:52 PM

## 2013-02-07 NOTE — ED Notes (Signed)
Assisted pt back to bed from restroom. Pt was able to ambulate with minimal assitance using the walker. Pt did state she felt short of breath upon returning to bed. Pt is currently hooked back up to oxygen at 5L. Pt states once back in bed on oxygen she felt fine.

## 2013-02-07 NOTE — ED Notes (Signed)
The pt is off bi-pap she is comfortable except for her back pain where she was injected.  Family at the bedside

## 2013-02-08 ENCOUNTER — Inpatient Hospital Stay (HOSPITAL_COMMUNITY): Payer: Medicare Other

## 2013-02-08 DIAGNOSIS — I369 Nonrheumatic tricuspid valve disorder, unspecified: Secondary | ICD-10-CM

## 2013-02-08 DIAGNOSIS — I214 Non-ST elevation (NSTEMI) myocardial infarction: Principal | ICD-10-CM

## 2013-02-08 LAB — BASIC METABOLIC PANEL
Chloride: 100 mEq/L (ref 96–112)
GFR calc Af Amer: 64 mL/min — ABNORMAL LOW (ref >90)
GFR calc non Af Amer: 55 mL/min — ABNORMAL LOW (ref >90)
Potassium: 4.3 mEq/L (ref 3.5–5.1)
Sodium: 142 mEq/L (ref 135–145)

## 2013-02-08 LAB — CBC
HCT: 36.8 % (ref 36.0–46.0)
MCHC: 31.3 g/dL (ref 30.0–36.0)
Platelets: 149 10*3/uL — ABNORMAL LOW (ref 150–400)
RDW: 14 % (ref 11.5–15.5)
WBC: 4.9 10*3/uL (ref 4.0–10.5)

## 2013-02-08 LAB — URINE MICROSCOPIC-ADD ON

## 2013-02-08 LAB — MAGNESIUM: Magnesium: 1.8 mg/dL (ref 1.5–2.5)

## 2013-02-08 LAB — ALBUMIN: Albumin: 3 g/dL — ABNORMAL LOW (ref 3.5–5.2)

## 2013-02-08 LAB — HEPARIN LEVEL (UNFRACTIONATED): Heparin Unfractionated: 0.14 IU/mL — ABNORMAL LOW (ref 0.30–0.70)

## 2013-02-08 LAB — TROPONIN I
Troponin I: 3.29 ng/mL (ref <0.30)
Troponin I: 2.16 ng/mL (ref <0.30)
Troponin I: 2.64 ng/mL (ref <0.30)

## 2013-02-08 LAB — URINALYSIS, ROUTINE W REFLEX MICROSCOPIC
Hgb urine dipstick: NEGATIVE
Urobilinogen, UA: 0.2 mg/dL (ref 0.0–1.0)
pH: 6.5 (ref 5.0–8.0)

## 2013-02-08 LAB — MRSA PCR SCREENING: MRSA by PCR: NEGATIVE

## 2013-02-08 LAB — TSH: TSH: 1.48 u[IU]/mL (ref 0.350–4.500)

## 2013-02-08 MED ORDER — NITROGLYCERIN 0.4 MG SL SUBL
0.4000 mg | SUBLINGUAL_TABLET | SUBLINGUAL | Status: DC | PRN
  Administered 2013-02-08 (×3): 0.4 mg via SUBLINGUAL
  Filled 2013-02-08: qty 25

## 2013-02-08 MED ORDER — FUROSEMIDE 10 MG/ML IJ SOLN
20.0000 mg | Freq: Once | INTRAMUSCULAR | Status: AC
  Administered 2013-02-08: 20 mg via INTRAVENOUS
  Filled 2013-02-08: qty 2

## 2013-02-08 MED ORDER — FENTANYL 25 MCG/HR TD PT72
50.0000 ug | MEDICATED_PATCH | TRANSDERMAL | Status: DC
  Administered 2013-02-08 – 2013-02-11 (×2): 50 ug via TRANSDERMAL
  Filled 2013-02-08: qty 2
  Filled 2013-02-08 (×2): qty 1

## 2013-02-08 MED ORDER — PANTOPRAZOLE SODIUM 40 MG PO TBEC
40.0000 mg | DELAYED_RELEASE_TABLET | Freq: Every day | ORAL | Status: DC
  Administered 2013-02-08 – 2013-02-10 (×3): 40 mg via ORAL
  Filled 2013-02-08 (×3): qty 1

## 2013-02-08 MED ORDER — HYDROCODONE-ACETAMINOPHEN 5-325 MG PO TABS: 2.0000 | ORAL_TABLET | Freq: Four times a day (QID) | ORAL | Status: DC | PRN

## 2013-02-08 MED ORDER — POLYETHYLENE GLYCOL 3350 17 G PO PACK
17.0000 g | PACK | Freq: Every morning | ORAL | Status: DC
  Administered 2013-02-09 – 2013-02-10 (×2): 17 g via ORAL
  Filled 2013-02-08 (×6): qty 1

## 2013-02-08 MED ORDER — ASPIRIN 81 MG PO CHEW
81.0000 mg | CHEWABLE_TABLET | Freq: Every day | ORAL | Status: DC
  Administered 2013-02-08 – 2013-02-10 (×3): 81 mg via ORAL
  Filled 2013-02-08 (×3): qty 1

## 2013-02-08 MED ORDER — ONDANSETRON HCL 4 MG/2ML IJ SOLN: 4.0000 mg | Freq: Four times a day (QID) | INTRAMUSCULAR | Status: DC | PRN

## 2013-02-08 MED ORDER — OXYCODONE HCL 5 MG PO TABS
5.0000 mg | ORAL_TABLET | ORAL | Status: DC | PRN
  Administered 2013-02-08 – 2013-02-11 (×2): 5 mg via ORAL
  Filled 2013-02-08 (×2): qty 1

## 2013-02-08 MED ORDER — NITROGLYCERIN IN D5W 200-5 MCG/ML-% IV SOLN
2.0000 ug/min | INTRAVENOUS | Status: DC
  Administered 2013-02-08: 2.5 ug/min via INTRAVENOUS

## 2013-02-08 MED ORDER — ZOLPIDEM TARTRATE 5 MG PO TABS
5.0000 mg | ORAL_TABLET | Freq: Every evening | ORAL | Status: DC | PRN
  Administered 2013-02-08 – 2013-02-11 (×4): 5 mg via ORAL
  Filled 2013-02-08 (×4): qty 1

## 2013-02-08 MED ORDER — CLOPIDOGREL BISULFATE 300 MG PO TABS
300.0000 mg | ORAL_TABLET | Freq: Once | ORAL | Status: AC
  Administered 2013-02-08: 300 mg via ORAL
  Filled 2013-02-08: qty 1

## 2013-02-08 MED ORDER — METOPROLOL TARTRATE 12.5 MG HALF TABLET
12.5000 mg | ORAL_TABLET | Freq: Two times a day (BID) | ORAL | Status: DC
  Administered 2013-02-08 – 2013-02-10 (×6): 12.5 mg via ORAL
  Filled 2013-02-08 (×8): qty 1

## 2013-02-08 MED ORDER — PREGABALIN 50 MG PO CAPS
50.0000 mg | ORAL_CAPSULE | Freq: Three times a day (TID) | ORAL | Status: DC
  Administered 2013-02-08 – 2013-02-10 (×8): 50 mg via ORAL
  Filled 2013-02-08: qty 1
  Filled 2013-02-08 (×5): qty 2
  Filled 2013-02-08 (×3): qty 1

## 2013-02-08 MED ORDER — DOCUSATE SODIUM 100 MG PO CAPS
100.0000 mg | ORAL_CAPSULE | Freq: Every day | ORAL | Status: DC
  Administered 2013-02-08 – 2013-02-10 (×3): 100 mg via ORAL
  Filled 2013-02-08 (×4): qty 1

## 2013-02-08 MED ORDER — FUROSEMIDE 10 MG/ML IJ SOLN
INTRAMUSCULAR | Status: AC
  Filled 2013-02-08: qty 4

## 2013-02-08 MED ORDER — LIDOCAINE 5 % EX PTCH
2.0000 | MEDICATED_PATCH | CUTANEOUS | Status: DC
  Administered 2013-02-08: 2 via TRANSDERMAL
  Filled 2013-02-08 (×4): qty 2

## 2013-02-08 MED ORDER — VENLAFAXINE HCL ER 75 MG PO CP24
75.0000 mg | ORAL_CAPSULE | Freq: Every day | ORAL | Status: DC
  Administered 2013-02-08 – 2013-02-10 (×3): 75 mg via ORAL
  Filled 2013-02-08 (×4): qty 1

## 2013-02-08 MED ORDER — CLOPIDOGREL BISULFATE 75 MG PO TABS
75.0000 mg | ORAL_TABLET | Freq: Every day | ORAL | Status: DC
  Administered 2013-02-08 – 2013-02-11 (×4): 75 mg via ORAL
  Filled 2013-02-08 (×4): qty 1

## 2013-02-08 MED ORDER — FUROSEMIDE 10 MG/ML IJ SOLN
40.0000 mg | Freq: Two times a day (BID) | INTRAMUSCULAR | Status: DC
Start: 2013-02-08 — End: 2013-02-09
  Administered 2013-02-08 (×2): 40 mg via INTRAVENOUS
  Filled 2013-02-08 (×3): qty 4

## 2013-02-08 MED ORDER — ATORVASTATIN CALCIUM 40 MG PO TABS
40.0000 mg | ORAL_TABLET | Freq: Every day | ORAL | Status: DC
  Administered 2013-02-08 – 2013-02-10 (×3): 40 mg via ORAL
  Filled 2013-02-08 (×4): qty 1

## 2013-02-08 MED ORDER — TRAMADOL HCL 50 MG PO TABS: 50.0000 mg | ORAL_TABLET | Freq: Four times a day (QID) | ORAL | Status: DC | PRN

## 2013-02-08 NOTE — Progress Notes (Signed)
ANTICOAGULATION CONSULT NOTE - Follow Up Consult  Pharmacy Consult for heparin Indication: chest pain/ACS  Allergies  Allergen Reactions  . Amoxicillin Nausea Only    Upset stomach with high dose  . Sporanox (Itraconazole) Hives  . Sulfa Antibiotics Nausea Only    Patient Measurements: Height: 5\' 2"  (157.5 cm) Weight: 116 lb 2.9 oz (52.7 kg) IBW/kg (Calculated) : 50.1 Heparin Dosing Weight: 52kg  Vital Signs: Temp: 98 F (36.7 C) (08/02 1610) Temp src: Oral (08/02 1610) BP: 113/68 mmHg (08/02 1530) Pulse Rate: 83 (08/02 1530)  Labs:  Recent Labs  02/07/13 1759 02/08/13 0030 02/08/13 0555 02/08/13 0723 02/08/13 1116 02/08/13 1610  HGB 10.9*  --   --  11.5*  --   --   HCT 34.8*  --   --  36.8  --   --   PLT 127*  --   --  149*  --   --   LABPROT  --   --   --  12.8  --   --   INR  --   --   --  0.98  --   --   HEPARINUNFRC  --   --   --  0.14*  --  0.26*  CREATININE 0.72  --   --  0.88  --   --   TROPONINI 1.42* 3.29* 2.16*  --  2.64*  --     Estimated Creatinine Clearance: 32.3 ml/min (by C-G formula based on Cr of 0.88).   Assessment: 26 YOF admitted with SOB from outpatient surgical center for further workup. Per Dr. Lovell Sheehan (on call for Dr. Danielle Dess 8/1 PM)- aim for lower end of dose range.  She was started on 600units/hr and first level was low at 0.14- increased to 800units/hr and her level is again low at 0.26. No bleeding noted.   Goal of Therapy:  Heparin level 0.3-0.5 units/ml Monitor platelets by anticoagulation protocol: Yes   Plan:  1. Increase heparin to 900units/hr which is ~2unit/kg increase 2. Next heparin level with morning labs 3. Daily HL and CBC 4. Follow up cath plans and signs of bleeding  Aayla Marrocco D. Kaycen Whitworth, PharmD Clinical Pharmacist Pager: [phone] 02/08/2013 5:13 PM

## 2013-02-08 NOTE — ED Provider Notes (Signed)
I saw and evaluated the patient, reviewed the resident's note and I agree with the findings and plan. The patient was brought the to ED by EMS from Dr. Nunzio Cobbs office for evaluation of shortness of breath.  She was seen there for steroid injections for her low back pain.  After receiving the injection she was found to be hypotensive, then fluids were given.  She then became acutely dyspneic with tachycardia and required cpap by ems to maintain her saturations.    On exam, the patient is hypotensive and tachycardic.  The vitals are otherwise stable.  The heart is tachycardic without murmurs.  The lungs have rales in the bases.  The abdomen is benign.  The extremities are without edema.    The patient arrived with bipap in place and hypotensive.  She appeared to be in congestive heart failure and in volume overload although she was hypotensive.  She was given lasix prior to coming here and we will await her response to this.  She was able to be weaned off the bipap but remained hypotensive.  Workup reveals an elevated bnp and chf on the chest xray.  Cardiology and medicine have been consulted and the patient will be admitted for this.  CRITICAL CARE Performed by: Geoffery Lyons Total critical care time: 30 minutes. Critical care time was exclusive of separately billable procedures and treating other patients. Critical care was necessary to treat or prevent imminent or life-threatening deterioration. Critical care was time spent personally by me on the following activities: development of treatment plan with patient and/or surrogate as well as nursing, discussions with consultants, evaluation of patient's response to treatment, examination of patient, obtaining history from patient or surrogate, ordering and performing treatments and interventions, ordering and review of laboratory studies, ordering and review of radiographic studies, pulse oximetry and re-evaluation of patient's condition.   Geoffery Lyons, MD 02/08/13 [phone]

## 2013-02-08 NOTE — Progress Notes (Signed)
  Echocardiogram 2D Echocardiogram has been performed.  Julia Black, [facility] 02/08/2013, 11:23 AM

## 2013-02-08 NOTE — Progress Notes (Signed)
Called by RN to see patient urgently. Patient with hx of CAD, s/p CABG, preserved LVF. Admitted last night with a NSTEMI. This am, developed more CP,dyspnea and tachycardia (HRs up into 120s).  O2 90%. Patient given NTG x 3.   She is feeling better upon my arrival.  Pain is better and breathing is better. RR improved. On NRB now. Tele reveals sinus tachy. ECG: sinus tachy, HR 107, TWI 1, aVL (new from yest) Exam:  NAD Neck: diff to assess JVD Cor: RRR, no murmur Lungs: bibasilar Rales Abd: distended Ext: tr-1+ edema  A/P Ongoing CP and dyspnea in the setting of acute CHF and NSTEMI. Give Lasix 40 mg IV x 1 now. Start NTG gtt 2.5 mcg Continue Lasix 40 IV bid Get portable CXR now Will make sure she is seen again soon this am. Hold off of beta blocker for now with acute CHF.  With NSTEMI and tachycardia, will try to get beta blocker started soon. Signed,  Tereso Newcomer, PA-C   02/08/2013 8:54 AM   See progress note to follow Olga Millers

## 2013-02-08 NOTE — Progress Notes (Signed)
Pt experiencing SOB, pain in left shoulder radiating acroos chest and to back. HR increasing into 110s with bursts into 140s, RR increased to high 30's. MD paged and on the way. 3 Nitro SL given q38m without complete releif of pain, pt sats low 90's on [place] but still complaining of SOB so started NRB, EKG obtained, and 2nd IV site accessed. Will continue to monitor closely.

## 2013-02-08 NOTE — Progress Notes (Signed)
   Subjective:  Denies CP but had earlier; complains of dyspnea.   Objective:  Filed Vitals:   02/08/13 0815 02/08/13 0820 02/08/13 0830 02/08/13 0845  BP:  131/93 106/74 119/67  Pulse: 108 116 106 101  Temp:      TempSrc:      Resp: 40 39 27 26  Height:      Weight:      SpO2: 96% 93% 100% 100%    Intake/Output from previous day:  Intake/Output Summary (Last 24 hours) at 02/08/13 0912 Last data filed at 02/08/13 0700  Gross per 24 hour  Intake   40.7 ml  Output    500 ml  Net -459.3 ml    Physical Exam: Physical exam: Well-developed frail in mild respiratory distress.  Skin is warm and dry.  HEENT is normal.  Neck is supple.  Chest with basilar crackles Cardiovascular exam is regular rate and rhythm.  Abdominal exam nontender or distended. No masses palpated. Extremities show chronic skin changes and trace edema. neuro grossly intact    Lab Results: Basic Metabolic Panel:  Recent Labs  96/04/54 1759 02/08/13 0030 02/08/13 0723  NA 140  --  142  K 4.7  --  4.3  CL 103  --  100  CO2 28  --  30  GLUCOSE 118*  --  140*  BUN 23  --  31*  CREATININE 0.72  --  0.88  CALCIUM 9.4  --  9.5  MG  --  1.8  --    CBC:  Recent Labs  02/07/13 1759 02/08/13 0723  WBC 8.6 4.9  NEUTROABS 7.7  --   HGB 10.9* 11.5*  HCT 34.8* 36.8  MCV 91.6 90.0  PLT 127* 149*   Cardiac Enzymes:  Recent Labs  02/07/13 1759 02/08/13 0030 02/08/13 0555  TROPONINI 1.42* 3.29* 2.16*     Assessment/Plan:  1 acute diastolic congestive heart failure-the patient presents with significant CHF. She is hypoxic. Plan diuresis and follow renal function. Check echocardiogram. 2 non-ST elevation myocardial infarction-the patient has ruled in. Treat with aspirin, Plavix, heparin, nitroglycerin and statin. Add low-dose metoprolol. She will most likely require cardiac catheterization. We will make a decision next 24 hours. 3 history of atrial fibrillation-the patient is in sinus  rhythm. We'll add low-dose beta blocker. Continue aspirin. Based on notes anticoagulation has been withheld because of fall risk. Will need previous notes. 4 hypertension-follow blood pressure and adjust medications as needed. 5 hyperlipidemia-continue statin.   Julia Black 02/08/2013, 9:12 AM

## 2013-02-09 LAB — HEPARIN LEVEL (UNFRACTIONATED)
Heparin Unfractionated: 0.74 IU/mL — ABNORMAL HIGH (ref 0.30–0.70)
Heparin Unfractionated: 0.28 IU/mL — ABNORMAL LOW (ref 0.30–0.70)

## 2013-02-09 LAB — CBC
HCT: 30.7 % — ABNORMAL LOW (ref 36.0–46.0)
Platelets: 145 10*3/uL — ABNORMAL LOW (ref 150–400)
RBC: 3.43 MIL/uL — ABNORMAL LOW (ref 3.87–5.11)
RDW: 14.3 % (ref 11.5–15.5)
WBC: 6 10*3/uL (ref 4.0–10.5)

## 2013-02-09 LAB — BASIC METABOLIC PANEL
CO2: 32 mEq/L (ref 19–32)
Chloride: 101 mEq/L (ref 96–112)
GFR calc Af Amer: 55 mL/min — ABNORMAL LOW (ref >90)
Potassium: 3.9 mEq/L (ref 3.5–5.1)

## 2013-02-09 MED ORDER — FUROSEMIDE 10 MG/ML IJ SOLN
20.0000 mg | Freq: Two times a day (BID) | INTRAMUSCULAR | Status: DC
  Administered 2013-02-09 – 2013-02-10 (×4): 20 mg via INTRAVENOUS
  Filled 2013-02-09 (×5): qty 2

## 2013-02-09 MED ORDER — FUROSEMIDE 10 MG/ML IJ SOLN: 20.0000 mg | Freq: Two times a day (BID) | INTRAMUSCULAR | Status: DC

## 2013-02-09 NOTE — Progress Notes (Signed)
ANTICOAGULATION CONSULT NOTE - Follow Up Consult  Pharmacy Consult for heparin Indication: chest pain/ACS  Allergies  Allergen Reactions  . Amoxicillin Nausea Only    Upset stomach with high dose  . Sporanox (Itraconazole) Hives  . Sulfa Antibiotics Nausea Only    Patient Measurements: Height: 5\' 2"  (157.5 cm) Weight: 116 lb 2.9 oz (52.7 kg) IBW/kg (Calculated) : 50.1 Heparin Dosing Weight: 52kg  Vital Signs: Temp: 97.3 F (36.3 C) (08/03 1600) Temp src: Oral (08/03 1600) BP: 126/60 mmHg (08/03 1600) Pulse Rate: 75 (08/03 1600)  Labs:  Recent Labs  02/07/13 1759 02/08/13 0030 02/08/13 0555  02/08/13 0723 02/08/13 1116 02/08/13 1610 02/09/13 0530 02/09/13 1541  HGB 10.9*  --   --   --  11.5*  --   --  9.9*  --   HCT 34.8*  --   --   --  36.8  --   --  30.7*  --   PLT 127*  --   --   --  149*  --   --  145*  --   LABPROT  --   --   --   --  12.8  --   --   --   --   INR  --   --   --   --  0.98  --   --   --   --   HEPARINUNFRC  --   --   --   < > 0.14*  --  0.26* 0.74* 0.28*  CREATININE 0.72  --   --   --  0.88  --   --  1.00  --   TROPONINI 1.42* 3.29* 2.16*  --   --  2.64*  --   --   --   < > = values in this interval not displayed.  Estimated Creatinine Clearance: 28.4 ml/min (by C-G formula based on Cr of 1).   Assessment: 3 YOF admitted with SOB from outpatient surgical center for further workup. Per Dr. Lovell Sheehan (on call for Dr. Danielle Dess 8/1 PM)- aim for lower end of dose range.  Heparin was high this morning and rate turned down to 600units/hr- repeat level was slightly low at 0.28, but through discussion with RN, bruising risk is high. Most recent blood draw has a left a large hematoma and RN is concerned.  Goal of Therapy:  Heparin level 0.3-0.5 units/ml Monitor platelets by anticoagulation protocol: Yes   Plan:  1. Continue heparin gtt at 600units/hr as bleeding risk is high 2. Check next level with AM labs  Durk Carmen D. Trezure Cronk, PharmD Clinical  Pharmacist Pager: [phone] 02/09/2013 5:03 PM

## 2013-02-09 NOTE — Progress Notes (Signed)
Pharmacist Heart Failure Core Measure Documentation  Assessment: Julia Black has an EF documented as 25-30% on 02/08/13 by Dr Johney Frame.  Rationale: Heart failure patients with left ventricular systolic dysfunction (LVSD) and an EF < 40% should be prescribed an angiotensin converting enzyme inhibitor (ACEI) or angiotensin receptor blocker (ARB) at discharge unless a contraindication is documented in the medical record.  This patient is not currently on an ACEI or ARB for HF.  This note is being placed in the record in order to provide documentation that a contraindication to the use of these agents is present for this encounter.  ACE Inhibitor or Angiotensin Receptor Blocker is contraindicated (specify all that apply)  []   ACEI allergy AND ARB allergy []   Angioedema []   Moderate or severe aortic stenosis []   Hyperkalemia [x]   Hypotension []   Renal artery stenosis []   Worsening renal function, preexisting renal disease or dysfunction  Verlene Mayer, PharmD, BCPS Pager [phone]   Dickey Gave 02/09/2013 3:27 PM

## 2013-02-09 NOTE — Progress Notes (Signed)
Pt BP was 70/40 (MAP 40s), patient was sleeping and did not appear in distress, HR 60, 3L [place] sats 100%, paged CARDS Shirlee Latch), informed him of patient and her BP, Per MD, did awake patient, she was neuro intact, rechecked BP and it was 95/55, patient did not state she was in any distress and denied CP and SOB.

## 2013-02-09 NOTE — Progress Notes (Signed)
   Subjective:  Denies CP; dyspnea improving.   Objective:  Filed Vitals:   02/09/13 0600 02/09/13 0700 02/09/13 0800 02/09/13 0846  BP: 102/67 113/57 122/92   Pulse: 75 66 82   Temp:    98.5 F (36.9 C)  TempSrc:    Oral  Resp: 16 11 19    Height:      Weight:      SpO2: 100% 100% 100%     Intake/Output from previous day:  Intake/Output Summary (Last 24 hours) at 02/09/13 5643 Last data filed at 02/09/13 0800  Gross per 24 hour  Intake  197.1 ml  Output   1250 ml  Net -1052.9 ml    Physical Exam: Physical exam: Well-developed well-nourished in no acute distress.  Skin is warm and dry.  HEENT is normal.  Neck is supple.  Chest with mildly diminished BS bases Cardiovascular exam is regular rate and rhythm.  Abdominal exam nontender or distended. No masses palpated. Extremities show no edema. neuro grossly intact    Lab Results: Basic Metabolic Panel:  Recent Labs  32/95/18 0030 02/08/13 0723 02/09/13 0530  NA  --  142 141  K  --  4.3 3.9  CL  --  100 101  CO2  --  30 32  GLUCOSE  --  140* 124*  BUN  --  31* 40*  CREATININE  --  0.88 1.00  CALCIUM  --  9.5 8.5  MG 1.8  --   --    CBC:  Recent Labs  02/07/13 1759 02/08/13 0723 02/09/13 0530  WBC 8.6 4.9 6.0  NEUTROABS 7.7  --   --   HGB 10.9* 11.5* 9.9*  HCT 34.8* 36.8 30.7*  MCV 91.6 90.0 89.5  PLT 127* 149* 145*   Cardiac Enzymes:  Recent Labs  02/08/13 0030 02/08/13 0555 02/08/13 1116  TROPONINI 3.29* 2.16* 2.64*     Assessment/Plan:  1 acute systolic congestive heart failure-the patient presented with significant CHF. Echocardiogram shows severe LV dysfunction with anteroseptal, apical and distal inferior akinesis. ? takotsubo cardiomyopathy. Plan change lasix to 20 mg IV BID; follow renal function. Continue low dose beta blocker; hold ACEI for now given recent hypotension; DC NTG. 2 non-ST elevation myocardial infarction-the patient has ruled in (? takotsubo CM). Treat with  aspirin, Plavix, heparin x 48 hours, and statin. Continue llow-dose metoprolol. Discussed options with patient. She would like conservative approach if possible. Plan myoview on Tuesday if she is better. Reserve cath for moderate to high risk study. Follow Hgb given mild decrease. 3 history of atrial fibrillation-the patient is in sinus rhythm. Will continue beta blocker. Continue aspirin. Based on notes anticoagulation has been withheld because of fall risk. Will need previous notes.  4 hypertension-DC NTG due to hypotension. 5 hyperlipidemia-continue statin.   Julia Black 02/09/2013, 9:09 AM

## 2013-02-09 NOTE — Evaluation (Signed)
Physical Therapy Evaluation Patient Details Name: Julia Black MRN: [id number] DOB: Mar 14, 1919 Today's Date: 02/09/2013 Time: 6644-0347 PT Time Calculation (min): 32 min  PT Assessment / Plan / Recommendation History of Present Illness  this is a 79 year old Caucasian female with the past medical history of coronary artery disease, status post CABG 10 years ago his, hyperlipidemia, hypertension who presented for further evaluation of shortness of breath.  NSTEMI ruled in during work up.  Clinical Impression  Patient demonstrates some modest deficits in functional mobility and activity tolerance at this time. Feel patient will benefit from continued skilled PT to address deficits and maximize independence in preparation for discharge home. Rec HHPT upon discharge. WIll continue to see as indicated.    PT Assessment  Patient needs continued PT services    Follow Up Recommendations  Home health PT          Equipment Recommendations  None recommended by PT    Recommendations for Other Services     Frequency Min 3X/week    Precautions / Restrictions Precautions Precautions: Fall Restrictions Weight Bearing Restrictions: No   Pertinent Vitals/Pain No pain reported, Back pain has improved. BPs stable 120's/70s, O2 on room air at rest 94%, during ambulation on room air 88%, rebounded quickly, then placed back on 2 liters 99%      Mobility  Bed Mobility Bed Mobility: Supine to Sit;Sitting - Scoot to Edge of Bed Supine to Sit: 5: Supervision Sitting - Scoot to Edge of Bed: 5: Supervision Details for Bed Mobility Assistance: No physical assist needed Transfers Transfers: Sit to Stand;Stand to Sit Sit to Stand: 4: Min guard Stand to Sit: 4: Min guard Details for Transfer Assistance: VCs for hand placement, no physical assist needed Ambulation/Gait Ambulation/Gait Assistance: 4: Min guard Ambulation Distance (Feet): 360 Feet Assistive device: 4-wheeled walker Ambulation/Gait  Assistance Details: steady with no complaints of SOB Gait Pattern: Step-through pattern;Trunk flexed;Narrow base of support Gait velocity: decreased        PT Diagnosis: Generalized weakness  PT Problem List: Decreased strength;Decreased range of motion;Decreased activity tolerance;Decreased mobility;Cardiopulmonary status limiting activity PT Treatment Interventions: DME instruction;Gait training;Stair training;Functional mobility training;Therapeutic activities;Therapeutic exercise;Patient/family education     PT Goals(Current goals can be found in the care plan section) Acute Rehab PT Goals Patient Stated Goal: to go home PT Goal Formulation: With patient Time For Goal Achievement: 02/23/13 Potential to Achieve Goals: Good  Visit Information  Last PT Received On: 02/09/13 Assistance Needed: +1 History of Present Illness: this is a 79 year old Caucasian female with the past medical history of coronary artery disease, status post CABG 10 years ago his, hyperlipidemia, hypertension who presented for further evaluation of shortness of breath.  NSTEMI ruled in during work up.       Prior Functioning  Home Living Family/patient expects to be discharged to:: Assisted living (Independent living) Home Equipment: Environmental consultant - 4 wheels Prior Function Level of Independence: Independent with assistive device(s) Comments: Lives in an independent living facility Communication Communication: HOH Dominant Hand: Right    Cognition  Cognition Arousal/Alertness: Awake/alert Behavior During Therapy: WFL for tasks assessed/performed Overall Cognitive Status: Within Functional Limits for tasks assessed    Extremity/Trunk Assessment Upper Extremity Assessment Upper Extremity Assessment: Generalized weakness Lower Extremity Assessment Lower Extremity Assessment: Generalized weakness Cervical / Trunk Assessment Cervical / Trunk Assessment: Kyphotic   Balance Balance Balance Assessed: Yes High  Level Balance High Level Balance Activites: Side stepping;Backward walking;Direction changes;Turns;Head turns High Level Balance Comments: goot stability. increased time with  use of rollator walker  End of Session PT - End of Session Equipment Utilized During Treatment: Gait belt;Oxygen Activity Tolerance: Patient tolerated treatment well Patient left: in chair;with call bell/phone within reach;with family/visitor present Nurse Communication: Mobility status;Other (comment) (SpO2 on room air dropped to 88% but rebounded quickly)  GP     Fabio Asa 02/09/2013, 11:26 AM Charlotte Crumb, PT DPT  [phone]

## 2013-02-09 NOTE — Progress Notes (Signed)
Patient's wounds/tears on RUE and BLE were assessed every 2 hours, RUE bruise did extend slighty as the night progressed, once heparin were drawn, spoke with [facility] about heparin, per pharmacy decreased to 6units, NO signs active bleeding, urine is clear, and there is no evidence of bleeding, will monitor.

## 2013-02-09 NOTE — Progress Notes (Signed)
ANTICOAGULATION CONSULT NOTE - Follow Up Consult  Pharmacy Consult for heparin Indication: chest pain/ACS  Allergies  Allergen Reactions  . Amoxicillin Nausea Only    Upset stomach with high dose  . Sporanox (Itraconazole) Hives  . Sulfa Antibiotics Nausea Only    Patient Measurements: Height: 5\' 2"  (157.5 cm) Weight: 116 lb 2.9 oz (52.7 kg) IBW/kg (Calculated) : 50.1 Heparin Dosing Weight: 52kg  Vital Signs: Temp: 98.6 F (37 C) (08/03 0400) Temp src: Oral (08/02 2025) BP: 98/44 mmHg (08/03 0400) Pulse Rate: 65 (08/03 0400)  Labs:  Recent Labs  02/07/13 1759 02/08/13 0030 02/08/13 0555 02/08/13 0723 02/08/13 1116 02/08/13 1610 02/09/13 0530  HGB 10.9*  --   --  11.5*  --   --   --   HCT 34.8*  --   --  36.8  --   --   --   PLT 127*  --   --  149*  --   --   --   LABPROT  --   --   --  12.8  --   --   --   INR  --   --   --  0.98  --   --   --   HEPARINUNFRC  --   --   --  0.14*  --  0.26* 0.74*  CREATININE 0.72  --   --  0.88  --   --   --   TROPONINI 1.42* 3.29* 2.16*  --  2.64*  --   --     Estimated Creatinine Clearance: 32.3 ml/min (by C-G formula based on Cr of 0.88).   Assessment: 31 YOF admitted with SOB from outpatient surgical center for further workup. Per Dr. Lovell Sheehan (on call for Dr. Danielle Dess 8/1 PM)- aim for lower end of dose range.  Heparin has reached saturation point this am. RN reports slight bruising. No overt bleeding noted.  Goal of Therapy:  Heparin level 0.3-0.5 units/ml Monitor platelets by anticoagulation protocol: Yes   Plan:   Decrease heparin to 600 units/hr and recheck HL in 8 hours.    Janice Coffin

## 2013-02-10 DIAGNOSIS — I4891 Unspecified atrial fibrillation: Secondary | ICD-10-CM

## 2013-02-10 DIAGNOSIS — I214 Non-ST elevation (NSTEMI) myocardial infarction: Secondary | ICD-10-CM

## 2013-02-10 LAB — BASIC METABOLIC PANEL
CO2: 29 mEq/L (ref 19–32)
Chloride: 100 mEq/L (ref 96–112)
Potassium: 4.3 mEq/L (ref 3.5–5.1)
Sodium: 139 mEq/L (ref 135–145)

## 2013-02-10 LAB — URINALYSIS, ROUTINE W REFLEX MICROSCOPIC
Bilirubin Urine: NEGATIVE
Nitrite: NEGATIVE
Specific Gravity, Urine: 1.007 (ref 1.005–1.030)
pH: 6 (ref 5.0–8.0)

## 2013-02-10 LAB — CBC
Hemoglobin: 10.7 g/dL — ABNORMAL LOW (ref 12.0–15.0)
Platelets: 136 10*3/uL — ABNORMAL LOW (ref 150–400)
RBC: 3.7 MIL/uL — ABNORMAL LOW (ref 3.87–5.11)
WBC: 6.1 10*3/uL (ref 4.0–10.5)

## 2013-02-10 LAB — URINE MICROSCOPIC-ADD ON

## 2013-02-10 MED ORDER — ENSURE PUDDING PO PUDG
1.0000 | Freq: Three times a day (TID) | ORAL | Status: DC
  Administered 2013-02-10 (×2): 1 via ORAL
  Filled 2013-02-10: qty 1

## 2013-02-10 MED ORDER — ADULT MULTIVITAMIN W/MINERALS CH
1.0000 | ORAL_TABLET | Freq: Every day | ORAL | Status: DC
  Administered 2013-02-10: 1 via ORAL
  Filled 2013-02-10 (×2): qty 1

## 2013-02-10 NOTE — Progress Notes (Signed)
Utilization review completed. Ardie Dragoo, RN, BSN. 

## 2013-02-10 NOTE — Clinical Documentation Improvement (Signed)
THIS DOCUMENT IS NOT A PERMANENT PART OF THE MEDICAL RECORD  Please update your documentation with the medical record to reflect your response to this query. If you need help knowing how to do this please call [phone].  02/10/13   Dear Dr. Johney Frame / Associates,  Patient with hypotension 88/51 requiring fluid resuscitation, AMS, hypoxia desat to 70% requiring BiPap, Acute Systolic CHF and Acute NSTEMI on admit. Please clarify in Notes and DC summary if this patient exhibited signs of and was treated for Shock on admit. Thank you.  You may use possible, probable, or suspect with inpatient documentation. possible, probable, suspected diagnoses MUST be documented at the time of discharge  Reviewed:  no additional documentation provided  Thank You,  Beverley Fiedler RN BSN Clinical Documentation Specialist: [phone] Health Information Management [place]  Not sure she had true cardiogenic shock on admit. She was hypoxic and would qualify for acute respiratory failure so I will use that. Bjorn Loser Barrett

## 2013-02-10 NOTE — Progress Notes (Signed)
INITIAL NUTRITION ASSESSMENT  DOCUMENTATION CODES Per approved criteria  -Non-severe (moderate) malnutrition in the context of chronic illness   INTERVENTION: Ensure Pudding po TID, each supplement provides 170 kcal and 4 grams of protein.  MVI daily. RD to continue to follow nutrition care plan.  NUTRITION DIAGNOSIS: Increased nutrient needs related to wound healing as evidenced by estimated needs.   Goal: Intake to meet >90% of estimated nutrition needs.  Monitor:  weight trends, lab trends, I/O's, PO intake, supplement tolerance  Reason for Assessment: MD Consult  79 y.o. female  Admitting Dx: NSTEMI  ASSESSMENT: [facility]F. Work-up reveals NSTEMI and acute CHF.  RD consulted for poor PO intake and wound healing.  Currently on Heart Healthy diet. Eating approximately 75%. Eats 3 meals daily at her assisted living facility. Drinks a Boost on some days. Eats minimal protein at meal times per her report.  Pt with moderate temporal wasting evident and severe clavicle wasting. Pt meets criteria for moderate MALNUTRITION in the context of chronic illness as evidenced by intake of severe muscle and moderate fat mass loss.   Height: Ht Readings from Last 1 Encounters:  02/08/13 5\' 2"  (1.575 m)    Weight: Wt Readings from Last 1 Encounters:  02/08/13 116 lb 2.9 oz (52.7 kg)    Ideal Body Weight: 110 lb  % Ideal Body Weight: 105%  Wt Readings from Last 10 Encounters:  02/08/13 116 lb 2.9 oz (52.7 kg)  12/31/12 115 lb 12.8 oz (52.527 kg)  12/26/12 113 lb (51.256 kg)  12/26/12 113 lb (51.256 kg)  12/12/12 114 lb 6.4 oz (51.891 kg)  08/05/12 117 lb 1.9 oz (53.125 kg)  05/03/12 117 lb 12.8 oz (53.434 kg)  03/26/12 115 lb (52.164 kg)  09/27/11 117 lb 12.8 oz (53.434 kg)  08/08/11 116 lb 1.9 oz (52.672 kg)    Usual Body Weight: 115 - 117 lb  % Usual Body Weight: 100%  BMI:  Body mass index is 21.24 kg/(m^2). WNL  Estimated Nutritional  Needs: Kcal: 1300 - 1500 Protein: 53 - 65 g Fluid: 1.5 liters daily  Skin:  Skin tear to R tibia Excoriated L upper arm  Diet Order: Cardiac  EDUCATION NEEDS: -No education needs identified at this time   Intake/Output Summary (Last 24 hours) at 02/10/13 0930 Last data filed at 02/10/13 0800  Gross per 24 hour  Intake    674 ml  Output    550 ml  Net    124 ml    Last BM: 8/1  Labs:   Recent Labs Lab 02/08/13 0030 02/08/13 0723 02/09/13 0530 02/10/13 0354  NA  --  142 141 139  K  --  4.3 3.9 4.3  CL  --  100 101 100  CO2  --  30 32 29  BUN  --  31* 40* 43*  CREATININE  --  0.88 1.00 1.03  CALCIUM  --  9.5 8.5 8.3*  MG 1.8  --   --   --   GLUCOSE  --  140* 124* 111*    CBG (last 3)  No results found for this basename: GLUCAP,  in the last 72 hours  Scheduled Meds: . aspirin  81 mg Oral Daily  . atorvastatin  40 mg Oral q1800  . clopidogrel  75 mg Oral Q breakfast  . docusate sodium  100 mg Oral Daily  . fentaNYL  50 mcg Transdermal Q72H  . furosemide  20 mg Intravenous  BID  . lidocaine  2 patch Transdermal Q24H  . metoprolol tartrate  12.5 mg Oral BID  .  morphine injection  1 mg Intravenous Once  . pantoprazole  40 mg Oral Daily  . polyethylene glycol  17 g Oral q morning - 10a  . pregabalin  50 mg Oral TID  . venlafaxine XR  75 mg Oral Daily    Continuous Infusions: . heparin 700 Units/hr (02/10/13 0800)    Past Medical History  Diagnosis Date  . SPINAL STENOSIS   . LOW BACK PAIN     chronic, follows with Nsurg for ESI  . FRACTURE, PELVIS, RIGHT 12/2009  . HIP FRACTURE, LEFT 09/2009    s/p L THA  . ALLERGIC RHINITIS   . CHF (congestive heart failure)   . Carpal tunnel syndrome   . Hypertension   . HYPERLIPIDEMIA   . GERD   . Arthritis   . Osteoporosis   . CORONARY ARTERY DISEASE 2004    s/p CABG  . Atrial fibrillation     no anticoag due to fall risk/age  . DEPRESSION     Past Surgical History  Procedure Laterality Date  .  (l) hip hemiarthroplasty  09/2009  . Appendectomy  1940  . Eye surgery      both cataracts  . Coronary artery bypass graft  2004  . Carpal tunnel release  2004    right and left-dsc  . Ulnar nerve transposition  2009    lt   . Vein ligation      legs  . Colonoscopy    . I&d extremity Right 12/26/2012    Procedure: IRRIGATION AND DEBRIDEMENT OF RIGHT LEG, SURGICAL PREP PLACEMENT OF A CELL AND VAC   ;  Surgeon: Wayland Denis, DO;  Location: [facility];  Service: Plastics;  Laterality: Right;    Jarold Motto MS, RD, LDN Pager: [phone] After-hours pager: [phone]

## 2013-02-10 NOTE — Progress Notes (Signed)
ANTICOAGULATION CONSULT NOTE - Follow Up Consult  Pharmacy Consult for heparin Indication: chest pain/ACS  Allergies  Allergen Reactions  . Amoxicillin Nausea Only    Upset stomach with high dose  . Sporanox (Itraconazole) Hives  . Sulfa Antibiotics Nausea Only    Patient Measurements: Height: 5\' 2"  (157.5 cm) Weight: 116 lb 2.9 oz (52.7 kg) IBW/kg (Calculated) : 50.1 Heparin Dosing Weight: 52kg  Vital Signs: Temp: 98.4 F (36.9 C) (08/04 0339) Temp src: Oral (08/04 0339) BP: 114/58 mmHg (08/04 0400) Pulse Rate: 67 (08/04 0400)  Labs:  Recent Labs  02/08/13 0030 02/08/13 0555 02/08/13 0723 02/08/13 1116  02/09/13 0530 02/09/13 1541 02/10/13 0354  HGB  --   --  11.5*  --   --  9.9*  --  10.7*  HCT  --   --  36.8  --   --  30.7*  --  33.8*  PLT  --   --  149*  --   --  145*  --  136*  LABPROT  --   --  12.8  --   --   --   --   --   INR  --   --  0.98  --   --   --   --   --   HEPARINUNFRC  --   --  0.14*  --   < > 0.74* 0.28* 0.18*  CREATININE  --   --  0.88  --   --  1.00  --  1.03  TROPONINI 3.29* 2.16*  --  2.64*  --   --   --   --   < > = values in this interval not displayed.  Estimated Creatinine Clearance: 27.6 ml/min (by C-G formula based on Cr of 1.03).   Assessment: 83 YOF admitted with SOB from outpatient surgical center for further workup. Per Dr. Lovell Sheehan (on call for Dr. Danielle Dess 8/1 PM)- aim for lower end of dose range.  Heparin was high yesterday am  and rate turned down to 600units/hr- repeat level was slightly low at 0.28, but through discussion with RN, bruising risk is high. Heparin level this am is below goal of 0.18. Areas of hematoma continue to gain size with subesquent sticks for labs. As Hgb and Hct are stable will slightly increase to 700 units/hr and f/u tomorrow am labs.  Goal of Therapy:  Heparin level 0.3-0.5 units/ml Monitor platelets by anticoagulation protocol: Yes   Plan:   increase heparin to 700 units/hr and f/u with am labs  8/5  Continue to monitor bruising.   Janice Coffin

## 2013-02-10 NOTE — Clinical Documentation Improvement (Signed)
THIS DOCUMENT IS NOT A PERMANENT PART OF THE MEDICAL RECORD  Please update your documentation with the medical record to reflect your response to this query. If you need help knowing how to do this please call [phone].  02/10/13   Dear Dr. Johney Frame / Associates,  Per ED MD Note: "shortness of breath, severe, constant", R: 25, "rales (bilateral to the middle of the back)", received 500cc IV in MD office for hypotension 88/51, then acutely dyspneic with tachycardia, hypoxia, AMS with Sats: 70%. Received BiPap, Lasix 20mg  IV, NTG and transported to ED admitted with NSTEMI and Acute Systolic CHF. Please clarify this constellation of symptoms now resolved, to better illustrate this patient's severity of illness and risk of mortality. Thank you.   Possible Clinical Conditions? - Acute respiratory failure resolved - Acute respiratory distress - Other Condition (please specify)    You may use possible, probable, or suspect with inpatient documentation. possible, probable, suspected diagnoses MUST be documented at the time of discharge  Reviewed: additional documentation in the medical record  Thank You,  Beverley Fiedler RN BSN Clinical Documentation Specialist: [phone] Health Information Management [place]

## 2013-02-10 NOTE — Progress Notes (Signed)
Physical Therapy Treatment Patient Details Name: Julia Black MRN: [id number] DOB: February 18, 1919 Today's Date: 02/10/2013 Time: 4098-1191 PT Time Calculation (min): 39 min  PT Assessment / Plan / Recommendation  History of Present Illness this is a 79 year old Caucasian female with the past medical history of coronary artery disease, status post CABG 10 years ago his, hyperlipidemia, hypertension who presented for further evaluation of shortness of breath.  NSTEMI ruled in during work up.   PT Comments   Patient continues to demonstrate good overall mobility and progress towards PT goals. Worked with patient on various balance activities for which she had no significant difficulties. Patient ambulated increased distance with rollator walker. No SOB or DOE reported. Spoke with patient and daughters at length regarding discharge recommendations and expectations for safety and mobility when discharge. Assisted patient with adjustments to rollator to increase safety and decrease burden of use. Will continue to work with patient and progress activity as tolerated. Encouraged continued ambulation with staff as able.  Follow Up Recommendations  Home health PT           Equipment Recommendations  None recommended by PT       Frequency Min 3X/week   Progress towards PT Goals Progress towards PT goals: Progressing toward goals  Plan Current plan remains appropriate    Precautions / Restrictions Precautions Precautions: Fall Restrictions Weight Bearing Restrictions: No   Pertinent Vitals/Pain NAD, vitals stable (family will be removing nail polish so as to assess SpO2 more readily.    Mobility  Bed Mobility Bed Mobility: Supine to Sit;Sitting - Scoot to Edge of Bed Supine to Sit: 5: Supervision Sitting - Scoot to Edge of Bed: 5: Supervision Details for Bed Mobility Assistance: No physical assist needed Transfers Transfers: Sit to Stand;Stand to Sit Sit to Stand: 5: Supervision Stand to Sit:  5: Supervision Details for Transfer Assistance: VCs for hand placement, no physical assist needed Ambulation/Gait Ambulation/Gait Assistance: 5: Supervision Ambulation Distance (Feet): 400 Feet Assistive device: 4-wheeled walker Ambulation/Gait Assistance Details: steady no assist needed Gait Pattern: Step-through pattern;Trunk flexed;Narrow base of support Gait velocity: improved      PT Goals (current goals can now be found in the care plan section) Acute Rehab PT Goals Patient Stated Goal: to go home PT Goal Formulation: With patient Time For Goal Achievement: 02/23/13 Potential to Achieve Goals: Good  Visit Information  Last PT Received On: 02/10/13 Assistance Needed: +1 History of Present Illness: this is a 79 year old Caucasian female with the past medical history of coronary artery disease, status post CABG 10 years ago his, hyperlipidemia, hypertension who presented for further evaluation of shortness of breath.  NSTEMI ruled in during work up.    Subjective Data  Subjective: I feel great Patient Stated Goal: to go home   Cognition  Cognition Arousal/Alertness: Awake/alert Behavior During Therapy: WFL for tasks assessed/performed Overall Cognitive Status: Within Functional Limits for tasks assessed    Balance  Balance Balance Assessed: Yes Static Sitting Balance Static Sitting - Balance Support: Feet supported Static Sitting - Level of Assistance: 7: Independent Static Sitting - Comment/# of Minutes: 6 minutes EOB performing various tasks and vitals assessment Dynamic Sitting Balance Dynamic Sitting - Balance Support: Feet supported;During functional activity Dynamic Sitting - Level of Assistance: 5: Stand by assistance Dynamic Sitting - Balance Activities: Lateral lean/weight shifting;Forward lean/weight shifting;Reaching for objects;Reaching across midline;Head control activities;Trunk control activities Dynamic Sitting - Comments: some modest instability noted  but patient able to self correct, no difficulty performing tasks High  Level Balance High Level Balance Activites: Side stepping;Backward walking;Direction changes;Turns;Head turns High Level Balance Comments: good stability. increased time with use of rollator walker  End of Session PT - End of Session Equipment Utilized During Treatment: Gait belt;Oxygen Activity Tolerance: Patient tolerated treatment well Patient left: in chair;with call bell/phone within reach;with family/visitor present Nurse Communication: Mobility status;Other (comment) (SpO2 on room air dropped to 88% but rebounded quickly)   GP     Fabio Asa 02/10/2013, 1:49 PM Charlotte Crumb, PT DPT  [phone]

## 2013-02-10 NOTE — Progress Notes (Signed)
Report given to RN on 3W . Reviewed pt's chart, orders, and activity level. Pt denies pain at this time. Care plan and education complete with family and patient verbalize  understanding.

## 2013-02-10 NOTE — Progress Notes (Signed)
Occupational Therapy Evaluation Patient Details Name: Julia Black MRN: [id number] DOB: 01-Jul-1919 Today's Date: 02/10/2013 Time: 4098-1191 OT Time Calculation (min): 37 min  OT Assessment / Plan / Recommendation History of present illness this is a 79 year old Caucasian female with the past medical history of coronary artery disease, status post CABG 10 years ago his, hyperlipidemia, hypertension who presented for further evaluation of shortness of breath.  NSTEMI ruled in during work up.   Clinical Impression   PTA pt lived in Independent Living apt at [facility] and was independent with ADL and mod I with mobility. Pt uses rollator for mobility. Pt making excellent progress. Difficult to assess need for O2 due to fingernail polish, however, no dyspnea noted after standing to complete ADL task and ambulating using rollator. Pt will be appropriate to D/C home with [facility] services when medically ready. Family will be able to provide assist as needed after d/C.  Pt will benefit from skilled OT services to facilitate D/C to next venue due to below deficits.    OT Assessment  Patient needs continued OT Services    Follow Up Recommendations  Home health OT    Barriers to Discharge      Equipment Recommendations  None recommended by OT    Recommendations for Other Services    Frequency  Min 2X/week    Precautions / Restrictions Precautions Precautions: Fall Restrictions Weight Bearing Restrictions: No   Pertinent Vitals/Pain no apparent distress     ADL  Eating/Feeding: Independent Where Assessed - Eating/Feeding: Chair Grooming: Modified independent Where Assessed - Grooming: Unsupported standing Upper Body Bathing: Set up;Supervision/safety Where Assessed - Upper Body Bathing: Unsupported sitting Lower Body Bathing: Set up;Supervision/safety Where Assessed - Lower Body Bathing: Unsupported sit to stand Upper Body Dressing: Set up;Supervision/safety Where Assessed - Upper Body  Dressing: Unsupported sitting Lower Body Dressing: Minimal assistance Where Assessed - Lower Body Dressing: Unsupported sit to stand Toilet Transfer: Supervision/safety Toilet Transfer Method: Other (comment) (ambulating) Toilet Transfer Equipment: Comfort height toilet Toileting - Clothing Manipulation and Hygiene: Supervision/safety Where Assessed - Engineer, mining and Hygiene: Sit to stand from 3-in-1 or toilet Tub/Shower Transfer: Supervision/safety Tub/Shower Transfer Method: Science writer: Other (comment) (BSC) Equipment Used: Gait belt;Other (comment);Rolling walker (4 wheeled) Transfers/Ambulation Related to ADLs: S    OT Diagnosis: Generalized weakness  OT Problem List: Decreased strength;Decreased activity tolerance;Decreased knowledge of use of DME or AE;Cardiopulmonary status limiting activity OT Treatment Interventions: Self-care/ADL training;Patient/family education;Therapeutic activities;DME and/or AE instruction;Energy conservation   OT Goals(Current goals can be found in the care plan section) Acute Rehab OT Goals Patient Stated Goal: to go home OT Goal Formulation: With patient Time For Goal Achievement: 02/24/13 Potential to Achieve Goals: Good  Visit Information  Last OT Received On: 02/10/13 Assistance Needed: +1 PT/OT Co-Evaluation/Treatment: Yes History of Present Illness: this is a 79 year old Caucasian female with the past medical history of coronary artery disease, status post CABG 10 years ago his, hyperlipidemia, hypertension who presented for further evaluation of shortness of breath.  NSTEMI ruled in during work up.       Prior Functioning     Home Living Family/patient expects to be discharged to:: Other (Comment) (Independent living) Home Equipment: Walker - 4 wheels;Bedside commode Prior Function Level of Independence: Independent with assistive device(s) Comments: Lives in an independent living  facility ([facility]) Communication Communication: HOH Dominant Hand: Right         Vision/Perception     Cognition  Cognition Arousal/Alertness: Awake/alert Behavior During Therapy:  WFL for tasks assessed/performed Overall Cognitive Status: Within Functional Limits for tasks assessed    Extremity/Trunk Assessment Upper Extremity Assessment Upper Extremity Assessment: Generalized weakness (arthritis B shoulders) Lower Extremity Assessment Lower Extremity Assessment: Generalized weakness Cervical / Trunk Assessment Cervical / Trunk Assessment: Kyphotic     Mobility Bed Mobility Bed Mobility: Supine to Sit;Sitting - Scoot to Edge of Bed Supine to Sit: 5: Supervision Sitting - Scoot to Edge of Bed: 5: Supervision Details for Bed Mobility Assistance: No physical assist needed Transfers Sit to Stand: 5: Supervision Stand to Sit: 5: Supervision Details for Transfer Assistance: VCs for hand placement, no physical assist needed     Exercise     Balance Balance Balance Assessed: Yes Static Sitting Balance Static Sitting - Balance Support: Feet supported Static Sitting - Level of Assistance: 7: Independent Static Sitting - Comment/# of Minutes: 6 minutes EOB performing various tasks and vitals assessment Dynamic Sitting Balance Dynamic Sitting - Balance Support: Feet supported;During functional activity Dynamic Sitting - Level of Assistance: 5: Stand by assistance Dynamic Sitting - Balance Activities: Lateral lean/weight shifting;Forward lean/weight shifting;Reaching for objects;Reaching across midline;Head control activities;Trunk control activities Dynamic Sitting - Comments: some modest instability noted but patient able to self correct, no difficulty performing tasks High Level Balance High Level Balance Activites: Side stepping;Backward walking;Direction changes;Turns;Head turns High Level Balance Comments: good stability. increased time with use of rollator walker    End of Session OT - End of Session Equipment Utilized During Treatment: Gait belt;Rolling walker;Oxygen Activity Tolerance: Patient tolerated treatment well Patient left: in chair;with call bell/phone within reach;with family/visitor present Nurse Communication: Mobility status (encouraged nsg to ambulate with pt)  GO     Sherryll Skoczylas,HILLARY 02/10/2013, 2:50 PM [facility], OTR/L  [phone] 02/10/2013

## 2013-02-10 NOTE — Progress Notes (Signed)
Patient: Julia Black Date of Encounter: 02/10/2013, 7:34 AM Admit date: 02/07/2013     Subjective  Ms. Coggeshall reports she is feeling much better. Her SOB is improving and her CP resolved.   Objective  Physical Exam: Vitals: BP 118/56  Pulse 71  Temp(Src) 98.4 F (36.9 C) (Oral)  Resp 13  Ht 5\' 2"  (1.575 m)  Wt 116 lb 2.9 oz (52.7 kg)  BMI 21.24 kg/m2  SpO2 100% General: Well developed, elderly 79 year old female in no acute distress. Neck: Supple. JVD not elevated. Lungs: Clear bilaterally to auscultation without wheezes, rales, or rhonchi. Breathing is unlabored. Heart: RRR S1 S2 without murmurs, rubs, or gallops.  Abdomen: Soft, non-distended. Extremities: No clubbing or cyanosis. No edema.  Distal pedal pulses are 2+ and equal bilaterally. Neuro: Alert and oriented X 3. Moves all extremities spontaneously. No focal deficits.  Intake/Output:  Intake/Output Summary (Last 24 hours) at 02/10/13 0734 Last data filed at 02/10/13 0400  Gross per 24 hour  Intake    846 ml  Output    550 ml  Net    296 ml    Inpatient Medications:  . aspirin  81 mg Oral Daily  . atorvastatin  40 mg Oral q1800  . clopidogrel  75 mg Oral Q breakfast  . docusate sodium  100 mg Oral Daily  . fentaNYL  50 mcg Transdermal Q72H  . furosemide  20 mg Intravenous BID  . lidocaine  2 patch Transdermal Q24H  . metoprolol tartrate  12.5 mg Oral BID  .  morphine injection  1 mg Intravenous Once  . pantoprazole  40 mg Oral Daily  . polyethylene glycol  17 g Oral q morning - 10a  . pregabalin  50 mg Oral TID  . venlafaxine XR  75 mg Oral Daily   . heparin 700 Units/hr (02/10/13 0505)    Labs:  Recent Labs  02/08/13 0030  02/09/13 0530 02/10/13 0354  NA  --   < > 141 139  K  --   < > 3.9 4.3  CL  --   < > 101 100  CO2  --   < > 32 29  GLUCOSE  --   < > 124* 111*  BUN  --   < > 40* 43*  CREATININE  --   < > 1.00 1.03  CALCIUM  --   < > 8.5 8.3*  MG 1.8  --   --   --   < > = values in  this interval not displayed.  Recent Labs  02/08/13 0723  ALBUMIN 3.0*    Recent Labs  02/07/13 1759  02/09/13 0530 02/10/13 0354  WBC 8.6  < > 6.0 6.1  NEUTROABS 7.7  --   --   --   HGB 10.9*  < > 9.9* 10.7*  HCT 34.8*  < > 30.7* 33.8*  MCV 91.6  < > 89.5 91.4  PLT 127*  < > 145* 136*  < > = values in this interval not displayed.  Recent Labs  02/07/13 1759 02/08/13 0030 02/08/13 0555 02/08/13 1116  TROPONINI 1.42* 3.29* 2.16* 2.64*    Recent Labs  02/08/13 0030  TSH 1.480    Recent Labs  02/08/13 0723  INR 0.98    Radiology/Studies: Dg Chest Port 1 View  02/08/2013   *RADIOLOGY REPORT*  Clinical Data: CHF, chest pain and hypoxia  PORTABLE CHEST - 1 VIEW  Comparison: Prior chest x-ray 02/07/2013  Findings: Stable cardiomegaly.  Surgical changes of prior median sternotomy with evidence of multivessel CABG.  Slightly increased bibasilar opacities with likely mildly enlarged pleural effusions and associated bibasilar atelectasis.  The degree of pulmonary vascular congestion and mild interstitial edema appears unchanged. No pneumothorax.  No acute osseous abnormality.  IMPRESSION:  1. Slightly increased bilateral pleural effusions and associated bibasilar atelectasis. 2.  Otherwise, stable appearance of CHF.   Original Report Authenticated By: Malachy Moan, M.D.    Echocardiogram: Study Conclusions - Left ventricle: The cavity size was normal. Wall thickness was increased in a pattern of mild LVH. Systolic function was severely reduced. The estimated ejection fraction was in the range of 25% to 30%. There is akinesis of the anteroseptal and apical myocardium. There is akinesis of the mid-distalinferior myocardium. Doppler parameters are consistent with abnormal left ventricular relaxation (grade 1 diastolic dysfunction). Doppler parameters are consistent with high ventricular filling pressure. - Mitral valve: Calcified annulus. - Left atrium: The atrium was  moderately dilated. - Pulmonary arteries: Systolic pressure was mildly to moderately increased. PA peak pressure: 52mm Hg (S).   Telemetry: SR with occasional PVCs    Assessment and Plan  1. Acute systolic HF  - Echocardiogram shows severe LV dysfunction with anteroseptal, apical and distal inferior akinesis, ? Takotsubo cardiomyopathy  - Continue Lasix to 20 mg IV BID and follow renal function  - Continue low dose beta blocker  - ACEI on hold for now and NTG stopped given hypotension 2. NSTEMI  - Treat with aspirin, Plavix, IV heparin x 48 hours and statin  - Continue low-dose BB   - Ms. Maret would like conservative approach if possible. Plan Myoview on Tuesday. Reserve cath for moderate to high risk study 3. History of atrial fibrillation  - Will continue beta blocker.   - Based on notes anticoagulation has been withheld due to fall risk. Will need previous notes.   Dr. Johney Frame to see Signed, EDMISTEN, BROOKE PA-C  I have seen, examined the patient, and reviewed the above assessment and plan.  Changes to above are made where necessary.  Will proceed with stress testing tomorrow.  If low/ moderate risk then will stop heparin at that point.  If high risk then she may require cath, though a more conservative approach is favored. Transfer to telemetry today.  Co Sign: Hillis Range, MD 02/10/2013 8:24 AM

## 2013-02-11 ENCOUNTER — Inpatient Hospital Stay (HOSPITAL_COMMUNITY): Payer: Medicare Other

## 2013-02-11 ENCOUNTER — Telehealth: Payer: Self-pay | Admitting: Cardiology

## 2013-02-11 DIAGNOSIS — I5023 Acute on chronic systolic (congestive) heart failure: Secondary | ICD-10-CM | POA: Diagnosis present

## 2013-02-11 DIAGNOSIS — I959 Hypotension, unspecified: Secondary | ICD-10-CM

## 2013-02-11 DIAGNOSIS — I2510 Atherosclerotic heart disease of native coronary artery without angina pectoris: Secondary | ICD-10-CM

## 2013-02-11 DIAGNOSIS — D696 Thrombocytopenia, unspecified: Secondary | ICD-10-CM | POA: Diagnosis present

## 2013-02-11 DIAGNOSIS — R0602 Shortness of breath: Secondary | ICD-10-CM

## 2013-02-11 DIAGNOSIS — R079 Chest pain, unspecified: Secondary | ICD-10-CM

## 2013-02-11 DIAGNOSIS — J9600 Acute respiratory failure, unspecified whether with hypoxia or hypercapnia: Secondary | ICD-10-CM | POA: Diagnosis present

## 2013-02-11 LAB — CBC
HCT: 36.7 % (ref 36.0–46.0)
Hemoglobin: 11.7 g/dL — ABNORMAL LOW (ref 12.0–15.0)
RBC: 4.08 MIL/uL (ref 3.87–5.11)
RDW: 14.1 % (ref 11.5–15.5)
WBC: 5.9 10*3/uL (ref 4.0–10.5)

## 2013-02-11 LAB — BASIC METABOLIC PANEL
CO2: 31 mEq/L (ref 19–32)
Calcium: 9 mg/dL (ref 8.4–10.5)
Chloride: 99 mEq/L (ref 96–112)
Glucose, Bld: 101 mg/dL — ABNORMAL HIGH (ref 70–99)
Potassium: 4.1 mEq/L (ref 3.5–5.1)
Sodium: 138 mEq/L (ref 135–145)

## 2013-02-11 LAB — HEPARIN LEVEL (UNFRACTIONATED): Heparin Unfractionated: 0.35 IU/mL (ref 0.30–0.70)

## 2013-02-11 MED ORDER — TECHNETIUM TC 99M SESTAMIBI GENERIC - CARDIOLITE
10.0000 | Freq: Once | INTRAVENOUS | Status: AC | PRN
  Administered 2013-02-11: 10 via INTRAVENOUS

## 2013-02-11 MED ORDER — METOPROLOL TARTRATE 25 MG PO TABS: 12.5000 mg | ORAL_TABLET | Freq: Two times a day (BID) | ORAL | Status: DC

## 2013-02-11 MED ORDER — TECHNETIUM TC 99M SESTAMIBI GENERIC - CARDIOLITE
30.0000 | Freq: Once | INTRAVENOUS | Status: AC | PRN
  Administered 2013-02-11: 30 via INTRAVENOUS

## 2013-02-11 MED ORDER — CLOPIDOGREL BISULFATE 75 MG PO TABS: 75.0000 mg | ORAL_TABLET | Freq: Every day | ORAL | Status: DC

## 2013-02-11 MED ORDER — PANTOPRAZOLE SODIUM 40 MG PO TBEC: 40.0000 mg | DELAYED_RELEASE_TABLET | Freq: Every day | ORAL | Status: DC

## 2013-02-11 MED ORDER — REGADENOSON 0.4 MG/5ML IV SOLN
0.4000 mg | Freq: Once | INTRAVENOUS | Status: AC
  Administered 2013-02-11: 0.4 mg via INTRAVENOUS
  Filled 2013-02-11: qty 5

## 2013-02-11 MED ORDER — FUROSEMIDE 40 MG PO TABS
40.0000 mg | ORAL_TABLET | Freq: Every day | ORAL | Status: DC
  Filled 2013-02-11: qty 1

## 2013-02-11 NOTE — Progress Notes (Signed)
Physical Therapy Treatment Patient Details Name: Julia Black MRN: [id number] DOB: Nov 25, 1918 Today's Date: 02/11/2013 Time: 1308-6578 PT Time Calculation (min): 26 min  PT Assessment / Plan / Recommendation  History of Present Illness this is a 79 year old Caucasian female with the past medical history of coronary artery disease, status post CABG 10 years ago his, hyperlipidemia, hypertension who presented for further evaluation of shortness of breath.  NSTEMI ruled in during work up.   PT Comments   Pt progressing well towards physical therapy goals. Pt was able to perform functional mobility while demonstrating safety awareness, and did not display SOB symptoms. Pt was on 1L/min supplemental O2 throughout session, as O2 saturation dropped to 86% while sitting EOB on room air. Pt was instructed in pursed-lip breathing, however O2 sats did not improve. Pt able to perform therapeutic exercise with minimal cueing for technique and form, with O2 saturation remaining at 98%.  Follow Up Recommendations  Home health PT     Does the patient have the potential to tolerate intense rehabilitation     Barriers to Discharge        Equipment Recommendations  None recommended by PT    Recommendations for Other Services    Frequency Min 3X/week   Progress towards PT Goals Progress towards PT goals: Progressing toward goals  Plan Current plan remains appropriate    Precautions / Restrictions Precautions Precautions: Fall Restrictions Weight Bearing Restrictions: No   Pertinent Vitals/Pain Pt reports no pain throughout session. On 1L/min supplemental O2 when PT entered. Sats decreased to 86% on room air while pt was sitting EOB. Supplemental O2 again donned.    Mobility  Bed Mobility Bed Mobility: Supine to Sit;Sit to Supine;Scooting to [facility];Sitting - Scoot to Edge of Bed Supine to Sit: 5: Supervision Sitting - Scoot to Edge of Bed: 5: Supervision Sit to Supine: 5: Supervision Scooting to [facility]:  5: Set up Details for Bed Mobility Assistance: Pt had difficulty scooting to [facility] due to feet slipping on sheets, otherwise did not need assist Transfers Transfers: Sit to Stand;Stand to Sit Sit to Stand: 5: Supervision Stand to Sit: 5: Supervision Details for Transfer Assistance: Pt demonstrated proper hand placement and safety awareness with sit<>stand, however was cued to lock brakes on walker prior to initiating sit. Ambulation/Gait Ambulation/Gait Assistance: 5: Supervision Ambulation Distance (Feet): 350 Feet Assistive device: 4-wheeled walker Ambulation/Gait Assistance Details: Pt does not report fatigue or SOB during ambulation. Pt's family reports she is ambulating faster than yesterday. Gait Pattern: Step-through pattern;Trunk flexed;Narrow base of support Gait velocity: improved Stairs: No Wheelchair Mobility Wheelchair Mobility: No    Exercises General Exercises - Lower Extremity Long Arc Quad: 15 reps;Both Hip ABduction/ADduction: 15 reps;Both Straight Leg Raises: 15 reps;Both Hip Flexion/Marching: 15 reps;Both   PT Diagnosis:    PT Problem List:   PT Treatment Interventions:     PT Goals (current goals can now be found in the care plan section) Acute Rehab PT Goals Patient Stated Goal: to go home PT Goal Formulation: With patient Time For Goal Achievement: 02/23/13 Potential to Achieve Goals: Good  Visit Information  Last PT Received On: 02/11/13 Assistance Needed: +1 History of Present Illness: this is a 79 year old Caucasian female with the past medical history of coronary artery disease, status post CABG 10 years ago his, hyperlipidemia, hypertension who presented for further evaluation of shortness of breath.  NSTEMI ruled in during work up.    Subjective Data  Subjective: "I'm doing fine." Patient Stated Goal: to go  home   Cognition  Cognition Arousal/Alertness: Awake/alert Behavior During Therapy: WFL for tasks assessed/performed Overall Cognitive  Status: Within Functional Limits for tasks assessed    Balance  Balance Balance Assessed: Yes Static Sitting Balance Static Sitting - Balance Support: Feet supported Static Sitting - Level of Assistance: 7: Independent Static Sitting - Comment/# of Minutes: 3  End of Session PT - End of Session Equipment Utilized During Treatment: Gait belt;Oxygen Activity Tolerance: Patient tolerated treatment well Patient left: in bed;with call bell/phone within reach;with family/visitor present   GP     Ruthann Cancer 02/11/2013, 3:05 PM  Ruthann Cancer, PT Acute Rehabilitation Services

## 2013-02-11 NOTE — Telephone Encounter (Signed)
New problem   Pt has 7day TCM w/[facility] 02/18/13 per Thayer Ohm PA calling.

## 2013-02-11 NOTE — Progress Notes (Signed)
Patient: Julia Black Date of Encounter: 02/11/2013, 7:59 AM Admit date: 02/07/2013     Subjective  Ms. Tuch reports she is feeling much better. Her SOB is improving and her CP resolved. For myoview today   Objective  Physical Exam: Vitals: BP 139/73  Pulse 76  Temp(Src) 97.4 F (36.3 C) (Oral)  Resp 18  Ht 5\' 1"  (1.549 m)  Wt 119 lb 3.2 oz (54.069 kg)  BMI 22.53 kg/m2  SpO2 98% General: Well developed, elderly 79 year old female in no acute distress. Neck: Supple. JVD not elevated. Lungs: Clear bilaterally to auscultation without wheezes, rales, or rhonchi. Breathing is unlabored. Heart: RRR without murmurs, rubs, or gallops.  Abdomen: Soft, non-distended. Extremities: No clubbing or cyanosis. No edema.  Distal pedal pulses are 2+ and equal bilaterally. Neuro: Alert and oriented X 3. Moves all extremities spontaneously. No focal deficits.  Intake/Output:  Intake/Output Summary (Last 24 hours) at 02/11/13 0759 Last data filed at 02/10/13 1030  Gross per 24 hour  Intake    560 ml  Output    800 ml  Net   -240 ml    Inpatient Medications:  . aspirin  81 mg Oral Daily  . atorvastatin  40 mg Oral q1800  . clopidogrel  75 mg Oral Q breakfast  . docusate sodium  100 mg Oral Daily  . feeding supplement  1 Container Oral TID BM  . fentaNYL  50 mcg Transdermal Q72H  . furosemide  20 mg Intravenous BID  . lidocaine  2 patch Transdermal Q24H  . metoprolol tartrate  12.5 mg Oral BID  . multivitamin with minerals  1 tablet Oral Daily  . pantoprazole  40 mg Oral Daily  . polyethylene glycol  17 g Oral q morning - 10a  . pregabalin  50 mg Oral TID  . regadenoson  0.4 mg Intravenous Once  . venlafaxine XR  75 mg Oral Daily   . heparin 700 Units/hr (02/10/13 2002)    Labs:  Recent Labs  02/09/13 0530 02/10/13 0354  NA 141 139  K 3.9 4.3  CL 101 100  CO2 32 29  GLUCOSE 124* 111*  BUN 40* 43*  CREATININE 1.00 1.03  CALCIUM 8.5 8.3*    Recent Labs   02/10/13 0354 02/11/13 0600  WBC 6.1 5.9  HGB 10.7* 11.7*  HCT 33.8* 36.7  MCV 91.4 90.0  PLT 136* 140*    Recent Labs  02/08/13 1116  TROPONINI 2.64*    Radiology/Studies: Dg Chest Port 1 View 02/08/2013   *RADIOLOGY REPORT*  Clinical Data: CHF, chest pain and hypoxia  PORTABLE CHEST - 1 VIEW  Comparison: Prior chest x-ray 02/07/2013  Findings: Stable cardiomegaly.  Surgical changes of prior median sternotomy with evidence of multivessel CABG.  Slightly increased bibasilar opacities with likely mildly enlarged pleural effusions and associated bibasilar atelectasis.  The degree of pulmonary vascular congestion and mild interstitial edema appears unchanged. No pneumothorax.  No acute osseous abnormality.  IMPRESSION:  1. Slightly increased bilateral pleural effusions and associated bibasilar atelectasis. 2.  Otherwise, stable appearance of CHF.   Original Report Authenticated By: Malachy Moan, M.D.    Echocardiogram: Study Conclusions - Left ventricle: The cavity size was normal. Wall thickness was increased in a pattern of mild LVH. Systolic function was severely reduced. The estimated ejection fraction was in the range of 25% to 30%. There is akinesis of the anteroseptal and apical myocardium. There is akinesis of the mid-distalinferior myocardium. Doppler parameters  are consistent with abnormal left ventricular relaxation (grade 1 diastolic dysfunction). Doppler parameters are consistent with high ventricular filling pressure. - Mitral valve: Calcified annulus. - Left atrium: The atrium was moderately dilated. - Pulmonary arteries: Systolic pressure was mildly to moderately increased. PA peak pressure: 52mm Hg (S).   Telemetry: SR with occasional PVCs    Assessment and Plan  1. Acute systolic HF  - Echocardiogram shows severe LV dysfunction with anteroseptal, apical and distal inferior akinesis, ? Takotsubo cardiomyopathy  - Convert lasix to PO  - Continue low dose  beta blocker  - ACEI on hold for now and NTG stopped given hypotension 2. NSTEMI  - Treat with aspirin, Plavix, IV heparin x 48 hours and statin  - Continue low-dose BB   - Ms. Scalici would like conservative approach if possible. Plan Myoview Today. Reserve cath for moderate to high risk study 3. History of atrial fibrillation  - Will continue beta blocker.   - Based on notes anticoagulation has been withheld due to fall risk.    DC to home later today if myoview is low/ moderate risk with close outpatient followup by Dennie Maizes, MD 02/11/2013 7:59 AM

## 2013-02-11 NOTE — Discharge Summary (Signed)
CARDIOLOGY DISCHARGE SUMMARY   Patient ID: Julia Black MRN: [id number] DOB/AGE: 77-Feb-1920 77 y.o.  Admit date: 02/07/2013 Discharge date: 02/11/2013  Primary Discharge Diagnosis:    Acute myocardial infarction, subendocardial infarction, initial episode of care - medical therapy recommended Secondary Discharge Diagnosis:    Acute respiratory failure   Acute on chronic systolic CHF (congestive heart failure)   HYPERLIPIDEMIA   HYPERTENSION   CORONARY ARTERY DISEASE   GERD   Thrombocytopenia  Consults: Neurosurgery  Procedures: Steffanie Dunn, 2-D echocardiogram  Hospital Course: Julia Black is a 79 y.o. female with a history of CAD. She went to Dr. Verlee Rossetti office for a translaminar epidural injection. She was also noted to have tachypnea with a respiratory rate at least 35 and after the injection she became more short of breath. Her oxygen saturation dropped into the 70s and her chest x-ray demonstrated patchy infiltrates. She received oxygen supplementation with an oxygen saturation improved to the 90s. She was hypotensive and was given a fluid bolus. She was sent to the emergency room for further evaluation and treatment.  She had some chest pain which was treated symptomatically. Her oxygen saturation was maintained initially with BiPAP but then with supplemental oxygen. She was diuresed with IV Lasix. The dose was decreased as her edema improved. Her blood pressure was borderline low on nitrates and they were decreased, then discontinued. As her blood pressure improved, she was started on a beta blocker. Her cardiac enzymes were cycled and an echocardiogram was ordered. She ruled in for non-ST segment elevation MI. This had likely caused her heart failure. Her EF was decreased but her blood pressure would not tolerate both a beta blocker and an ACE inhibitor. Because of her history of atrial fibrillation and tachycardia, a beta blocker was preferred. She had already been on a statin and  this was continued.  She had mild thrombocytopenia but no bleeding issues. Her BUN increased slightly with diuresis but her creatinine remained within normal limits. Her electrolytes were followed closely during her hospital stay as was her CBC. She is not fully anticoagulated due to fall risk. She was already on aspirin at 81 mg day and had Plavix added to her medication regimen. She is tolerating these medications well.  She had a lipid profile performed and this was reviewed. She is on a statin and is to continue this as an outpatient. She has a history of atrial fibrillation and was monitored during her hospital stay. She was maintaining sinus rhythm. She had tachycardia shortly after admission due to pain and shortness of breath. However this improved and she is to continue her beta blocker. She continued to deal with back pain. This was managed with her home medications and she is to continue these as an outpatient.  She ruled in for non-STEMI. The situation was discussed with the patient. Her symptoms were controlled and she felt she was improving. Her echo was reviewed and she has a possible Takotsubo cardiomyopathy. She preferred a noninvasive approach, and it was decided to do a Myoview with cath reserved for high risk study. By 02/11/2013, she was stable for a Myoview. The results are below. The study is considered low risk and no further inpatient workup was indicated.  The 02/11/2013, Ms. Julia Black has significantly improved. Her her back pain was under control and she was having no more chest pain. She was evaluated by Dr. Johney Frame and considered stable for discharge, to follow up as an outpatient.  Labs:  Lab  Results  Component Value Date   WBC 5.9 02/11/2013   HGB 11.7* 02/11/2013   HCT 36.7 02/11/2013   MCV 90.0 02/11/2013   PLT 140* 02/11/2013     Recent Labs Lab 02/11/13 0838  NA 138  K 4.1  CL 99  CO2 31  BUN 33*  CREATININE 0.72  CALCIUM 9.0  GLUCOSE 101*   Lab Results  Component  Value Date   TROPONINI 2.64* 02/08/2013   Lipid Panel     Component Value Date/Time   CHOL 165 03/26/2012 1154   TRIG 121.0 03/26/2012 1154   HDL 56.50 03/26/2012 1154   CHOLHDL 3 03/26/2012 1154   VLDL 24.2 03/26/2012 1154   LDLCALC 84 03/26/2012 1154    Pro B Natriuretic peptide (BNP)  Date/Time Value Range Status  02/07/2013  5:59 PM 1264.0* 0 - 450 pg/mL Final  08/11/2010 12:28 PM 146.0* 0.0 - 100.0 pg/mL Final      Radiology: Nm Myocar Multi W/spect W/wall Motion / Ef 02/11/2013   *RADIOLOGY REPORT*  Clinical Data:  Chest pain  MYOCARDIAL IMAGING WITH SPECT (REST AND PHARMACOLOGIC-STRESS) GATED LEFT VENTRICULAR WALL MOTION STUDY LEFT VENTRICULAR EJECTION FRACTION  Technique:  Standard myocardial SPECT imaging was performed after resting intravenous injection of 10 mCi Tc-42m sestamibi. Subsequently, intravenous infusion of Lexiscan was performed under the supervision of the Cardiology staff.  At peak effect of the drug, 30 mCi Tc-58m sestamibi was injected intravenously and standard myocardial SPECT  imaging was performed.  Quantitative gated imaging was also performed to evaluate left ventricular wall motion, and estimate left ventricular ejection fraction.  Comparison:  None.  Findings:  Spect:  Fixed defect at the apical third of the anterior wall.  No stress induced ischemia.  Wall motion:  Marked hypokinesis of the entire apex. Mild hypokinesis of the other walls.  Ejection fraction:  49%.  End diastolic volume 80 ml.  End-systolic volume 40 ml.  IMPRESSION: Fixed defect in the anterior wall as described.  No stress induced ischemia.   Original Report Authenticated By: [name], M.D.   Dg Chest Port 1 View 02/08/2013   *RADIOLOGY REPORT*  Clinical Data: CHF, chest pain and hypoxia  PORTABLE CHEST - 1 VIEW  Comparison: Prior chest x-ray 02/07/2013  Findings: Stable cardiomegaly.  Surgical changes of prior median sternotomy with evidence of multivessel CABG.  Slightly increased bibasilar opacities  with likely mildly enlarged pleural effusions and associated bibasilar atelectasis.  The degree of pulmonary vascular congestion and mild interstitial edema appears unchanged. No pneumothorax.  No acute osseous abnormality.  IMPRESSION:  1. Slightly increased bilateral pleural effusions and associated bibasilar atelectasis. 2.  Otherwise, stable appearance of CHF.   Original Report Authenticated By: Malachy Moan, M.D.   Dg Chest Port 1 View 02/07/2013   *RADIOLOGY REPORT*  Clinical Data: Respiratory distress.  Shortness of breath.  PORTABLE CHEST - 1 VIEW  Comparison: 08/12/2012.  Findings: The heart is enlarged.  Stable surgical changes from bypass surgery.  There is interstitial pulmonary edema and probable small bilateral pleural effusions consistent with CHF.  IMPRESSION: CHF.   Original Report Authenticated By: Rudie Meyer, M.D.   EKG: Sinus tachycardia, rate 107, first degree AV block and nonspecific ST/T wave changes  Echo: 02/08/2013 Conclusions - Left ventricle: The cavity size was normal. Wall thickness was increased in a pattern of mild LVH. Systolic function was severely reduced. The estimated ejection fraction was in the range of 25% to 30%. There is akinesis of the anteroseptal and apical  myocardium. There is akinesis of the mid-distalinferior myocardium. Doppler parameters are consistent with abnormal left ventricular relaxation (grade 1 diastolic dysfunction). Doppler parameters are consistent with high ventricular filling pressure. - Mitral valve: Calcified annulus. - Left atrium: The atrium was moderately dilated. - Pulmonary arteries: Systolic pressure was mildly to moderately increased. PA peak pressure: 52mm Hg (S).   FOLLOW UP PLANS AND APPOINTMENTS Allergies  Allergen Reactions  . Amoxicillin Nausea Only    Upset stomach with high dose  . Sporanox (Itraconazole) Hives  . Sulfa Antibiotics Nausea Only     Medication List    STOP taking these medications        carvedilol 25 MG tablet  Commonly known as:  COREG     hydrochlorothiazide 12.5 MG capsule  Commonly known as:  MICROZIDE     omeprazole 20 MG capsule  Commonly known as:  PRILOSEC  Replaced by:  pantoprazole 40 MG tablet     potassium chloride SA 20 MEQ tablet  Commonly known as:  K-DUR,KLOR-CON      TAKE these medications       ALPRAZolam 0.25 MG tablet  Commonly known as:  XANAX  Take 1 tablet (0.25 mg total) by mouth at bedtime as needed for sleep or anxiety.     aspirin 81 MG tablet  Take 81 mg by mouth every morning.     Calcium Carbonate-Vitamin D 600-400 MG-UNIT per tablet  Take 1 tablet by mouth daily.     clopidogrel 75 MG tablet  Commonly known as:  PLAVIX  Take 1 tablet (75 mg total) by mouth daily with breakfast.     docusate sodium 100 MG capsule  Commonly known as:  COLACE  Take 100 mg by mouth daily.     fentaNYL 50 MCG/HR  Commonly known as:  DURAGESIC  Place 1 patch (50 mcg total) onto the skin every 3 (three) days.     Fish Oil 1000 MG Caps  Take by mouth daily.     furosemide 40 MG tablet  Commonly known as:  LASIX  Take 40 mg by mouth daily as needed for fluid or edema.     guaiFENesin 600 MG 12 hr tablet  Commonly known as:  MUCINEX  Take 600 mg by mouth 2 (two) times daily.     HYDROcodone-acetaminophen 5-325 MG per tablet  Commonly known as:  NORCO/VICODIN  Take 2 tablets by mouth every 6 (six) hours as needed for pain.     lidocaine 5 %  Commonly known as:  LIDODERM  Place 2 patches onto the skin daily. Remove & Discard patch within 12 hours or as directed by MD     metoprolol tartrate 25 MG tablet  Commonly known as:  LOPRESSOR  Take 0.5 tablets (12.5 mg total) by mouth 2 (two) times daily.     multivitamin with minerals Tabs tablet  Take 1 tablet by mouth daily.     nitroGLYCERIN 0.6 MG SL tablet  Commonly known as:  NITROSTAT  Place 0.6 mg under the tongue every 5 (five) minutes as needed.     pantoprazole 40 MG  tablet  Commonly known as:  PROTONIX  Take 1 tablet (40 mg total) by mouth daily.     polyethylene glycol packet  Commonly known as:  MIRALAX / GLYCOLAX  Take 17 g by mouth every morning.     pregabalin 50 MG capsule  Commonly known as:  LYRICA  Take 50 mg by mouth 3 (three) times daily.  PROBIOTIC DAILY PO  Take 1 each by mouth daily as needed (for GI tract).     simvastatin 40 MG tablet  Commonly known as:  ZOCOR  Take 40 mg by mouth at bedtime.     traMADol 50 MG tablet  Commonly known as:  ULTRAM  Take 50 mg by mouth every 6 (six) hours as needed for pain.     venlafaxine XR 75 MG 24 hr capsule  Commonly known as:  EFFEXOR-XR  Take 75 mg by mouth daily.     vitamin C 500 MG tablet  Commonly known as:  ASCORBIC ACID  Take 500 mg by mouth daily.     vitamin C 500 MG tablet  Commonly known as:  ASCORBIC ACID  Take 500 mg by mouth at bedtime.     Vitamin D3 1000 UNITS Caps  Take by mouth daily.     zolpidem 5 MG tablet  Commonly known as:  AMBIEN  Take 5 mg by mouth at bedtime as needed for sleep.         Future Appointments Provider Department Dept Phone   02/18/2013 11:30 AM Minda Meo, PA-C [facility] Main Office [place]) [phone]     Follow-up Information   Follow up with Rick Duff, PA-C On 02/18/2013. (11:30 AM)    Contact information:   [address] [place] [address] [phone]       Follow up with Rene Paci, MD. (as scheduled.)    Contact information:   [address] [place] [address] [phone]       BRING ALL MEDICATIONS WITH YOU TO FOLLOW UP APPOINTMENTS  Time spent with patient to include physician time: 35 min Signed: Theodore Demark, PA-C 02/11/2013, 5:45 PM Co-Sign MD   Hillis Range, MD

## 2013-02-12 LAB — URINE CULTURE: Colony Count: >=100000

## 2013-02-12 MED ORDER — NITROGLYCERIN 0.6 MG SL SUBL: 0.6000 mg | SUBLINGUAL_TABLET | SUBLINGUAL | Status: DC | PRN

## 2013-02-12 MED ORDER — NITROGLYCERIN 0.4 MG SL SUBL: 0.4000 mg | SUBLINGUAL_TABLET | SUBLINGUAL | Status: DC | PRN

## 2013-02-12 NOTE — Telephone Encounter (Signed)
Pt's daughter called to have the NTG sublingual 0.6 mg reorder . Prescription reorder send to [facility]; dispense 25 tablets and 3 refills. Daughter aware.

## 2013-02-12 NOTE — Telephone Encounter (Signed)
New prob  Daughter is requesting a prescription of Nitro pills for her mom. She said she was previously on them but the script has since expired.

## 2013-02-12 NOTE — Care Management (Signed)
Care Manager received information on pt that was d/c late yesterday evening. Pt needed PT services @ [facility]- no order was written for PT services. CM did call [facility] to see if [facility] can call the PCP for pt to get an order for services.  CM spoke to [place]. No further needs from CM @ this time. Gala Lewandowsky, RN,BSN [phone]

## 2013-02-12 NOTE — Addendum Note (Signed)
Addended by: Micki Riley C on: 02/12/2013 12:04 PM   Modules accepted: Orders, Medications

## 2013-02-12 NOTE — Telephone Encounter (Signed)
Pharmacy calling to see if Julia Black is taking 0.6 or 0.4 of Nitro. I called the daughter and her mother is taking 0.4 nitro. Will resent rx to pharmacy.   Micki Riley, CMA

## 2013-02-14 ENCOUNTER — Encounter: Payer: Self-pay | Admitting: Internal Medicine

## 2013-02-18 ENCOUNTER — Other Ambulatory Visit: Payer: Self-pay | Admitting: *Deleted

## 2013-02-18 ENCOUNTER — Encounter: Payer: Self-pay | Admitting: Cardiology

## 2013-02-18 ENCOUNTER — Ambulatory Visit (INDEPENDENT_AMBULATORY_CARE_PROVIDER_SITE_OTHER): Payer: Medicare Other | Admitting: Cardiology

## 2013-02-18 VITALS — BP 124/70 | HR 60 | Ht 61.0 in | Wt 114.1 lb

## 2013-02-18 DIAGNOSIS — I214 Non-ST elevation (NSTEMI) myocardial infarction: Secondary | ICD-10-CM

## 2013-02-18 DIAGNOSIS — I4891 Unspecified atrial fibrillation: Secondary | ICD-10-CM

## 2013-02-18 DIAGNOSIS — I5022 Chronic systolic (congestive) heart failure: Secondary | ICD-10-CM

## 2013-02-18 DIAGNOSIS — I2510 Atherosclerotic heart disease of native coronary artery without angina pectoris: Secondary | ICD-10-CM

## 2013-02-18 DIAGNOSIS — I519 Heart disease, unspecified: Secondary | ICD-10-CM

## 2013-02-18 MED ORDER — FENTANYL 50 MCG/HR TD PT72: 1.0000 | MEDICATED_PATCH | TRANSDERMAL | Status: DC

## 2013-02-18 NOTE — Telephone Encounter (Signed)
Left msg on vm needing refill on her fentanyl patches/...lmb

## 2013-02-18 NOTE — Patient Instructions (Addendum)
Your physician recommends that you schedule a follow-up appointment in: DR. Jens Som IN 4 WEEKS  Your physician recommends that you continue on your current medications as directed. Please refer to the Current Medication list given to you today.

## 2013-02-18 NOTE — Telephone Encounter (Signed)
Notified pt rx ready for pick-up.../lmb 

## 2013-02-19 NOTE — Progress Notes (Signed)
CARDIOLOGY OFFICE NOTE  Patient ID: Julia Black MRN: [id number], DOB/AGE: 12-Oct-1918   Date of Visit: 02/18/2013  Primary Physician: Rene Paci, MD Primary Cardiologist: previously [facility]; now requesting Jens Som, MD Reason for Visit: Hospital follow-up  History of Present Illness  Julia Black is a 79 y.o. female with known CAD who underwent a translaminar epidural injection on 02/07/2013. Following the procedure, she was noted to be tachypneic, hypoxic and hypotensive. Her O2 sat dropped into the 70s. She was placed on O2 and transferred to [facility]. CXR showed patchy infiltrates. She was diuresed with IV Lasix. She ruled in for NSTEMI with positive troponin. An echo was done revealing severe LV dysfunction with anteroseptal, apical and distal inferior akinesis, ? Takotsubo cardiomyopathy. Her EF was decreased but her blood pressure would not tolerate both a beta blocker and an ACE inhibitor. Because of her history of atrial fibrillation, a beta blocker was preferred. Ms. Vassell elected conservative management. She then underwent Myoview stress test which was negative for ischemia. Her medical therapy was optimized and she was discharged home on 02/11/2013. She has a history of AFib; however, anticoagulation was withheld due to advanced age and increased fall risk.  She presents today for hospital followup. She is accompanied by her daughter and son in-law. Since dishcarge, she reports she is doing well and has no complaints. She denies chest pain or shortness of breath. She denies palpitations, dizziness, near syncope or syncope. She denies LE swelling, orthopnea, PND or recent weight gain. She is compliant and tolerating medications without difficulty.  Past Medical History Past Medical History  Diagnosis Date  . SPINAL STENOSIS   . LOW BACK PAIN     chronic, follows with Nsurg for ESI  . FRACTURE, PELVIS, RIGHT 12/2009  . HIP FRACTURE, LEFT 09/2009    s/p L THA  . ALLERGIC RHINITIS   . CHF  (congestive heart failure)   . Carpal tunnel syndrome   . Hypertension   . HYPERLIPIDEMIA   . GERD   . Arthritis   . Osteoporosis   . CORONARY ARTERY DISEASE 2004    s/p CABG  . Atrial fibrillation     no anticoag due to fall risk/age  . DEPRESSION     Past Surgical History Past Surgical History  Procedure Laterality Date  . (l) hip hemiarthroplasty  09/2009  . Appendectomy  1940  . Eye surgery      both cataracts  . Coronary artery bypass graft  2004  . Carpal tunnel release  2004    right and left-dsc  . Ulnar nerve transposition  2009    lt   . Vein ligation      legs  . Colonoscopy    . I&d extremity Right 12/26/2012    Procedure: IRRIGATION AND DEBRIDEMENT OF RIGHT LEG, SURGICAL PREP PLACEMENT OF A CELL AND VAC   ;  Surgeon: Wayland Denis, DO;  Location: [facility];  Service: Plastics;  Laterality: Right;    Allergies/Intolerances Allergies  Allergen Reactions  . Amoxicillin Nausea Only    Upset stomach with high dose  . Sporanox [Itraconazole] Hives  . Sulfa Antibiotics Nausea Only   Current Home Medications Current Outpatient Prescriptions  Medication Sig Dispense Refill  . ALPRAZolam (XANAX) 0.25 MG tablet Take 1 tablet (0.25 mg total) by mouth at bedtime as needed for sleep or anxiety.  30 tablet  5  . Ascorbic Acid (VITAMIN C) 500 MG tablet Take 1,000 mg by mouth daily.       [name]  aspirin 81 MG tablet Take 81 mg by mouth every morning.       . Cholecalciferol (VITAMIN D3) 1000 UNITS CAPS Take by mouth daily.        . clopidogrel (PLAVIX) 75 MG tablet Take 1 tablet (75 mg total) by mouth daily with breakfast.  30 tablet  6  . docusate sodium (COLACE) 100 MG capsule Take 100 mg by mouth 2 (two) times daily.       . furosemide (LASIX) 40 MG tablet Take 40 mg by mouth daily as needed for fluid or edema.   60 tablet  5  . HYDROcodone-acetaminophen (NORCO/VICODIN) 5-325 MG per tablet Take 2 tablets by mouth every 6 (six) hours as needed for pain.  60  tablet  1  . lidocaine (LIDODERM) 5 % Place 2 patches onto the skin daily. Remove & Discard patch within 12 hours or as directed by MD      . metoprolol tartrate (LOPRESSOR) 25 MG tablet Take 0.5 tablets (12.5 mg total) by mouth 2 (two) times daily.  30 tablet  6  . Multiple Vitamin (MULTIVITAMIN WITH MINERALS) TABS Take 1 tablet by mouth daily.      . nitroGLYCERIN (NITROSTAT) 0.4 MG SL tablet Place 1 tablet (0.4 mg total) under the tongue every 5 (five) minutes as needed for chest pain (upt o 3 doses).  25 tablet  4  . Omega-3 Fatty Acids (FISH OIL) 1000 MG CAPS Take by mouth daily.       . pantoprazole (PROTONIX) 40 MG tablet Take 1 tablet (40 mg total) by mouth daily.  30 tablet  6  . polyethylene glycol (MIRALAX / GLYCOLAX) packet Take 17 g by mouth every morning.      . pregabalin (LYRICA) 50 MG capsule Take 50 mg by mouth 3 (three) times daily.      . Probiotic Product (PROBIOTIC DAILY PO) Take 1 each by mouth daily as needed (for GI tract).      . simvastatin (ZOCOR) 40 MG tablet Take 40 mg by mouth at bedtime.      . traMADol (ULTRAM) 50 MG tablet Take 50 mg by mouth every 6 (six) hours as needed for pain.      [name] venlafaxine XR (EFFEXOR-XR) 75 MG 24 hr capsule Take 75 mg by mouth daily.      [name] zolpidem (AMBIEN) 5 MG tablet Take 5 mg by mouth at bedtime as needed for sleep.      . fentaNYL (DURAGESIC) 50 MCG/HR Place 1 patch (50 mcg total) onto the skin every 3 (three) days.  10 patch  0   No current facility-administered medications for this visit.   Social History Social History  . Marital Status: Divorced   Social History Main Topics  . Smoking status: Former Smoker    Quit date: 07/10/1978  . Smokeless tobacco: Never Used     Comment: Lives alone in indep living facility @ [facility]. Active ADLS but does not drive dur to macular degen. supportive family members nearby  . Alcohol Use: No  . Drug Use: No   Review of Systems General: No chills, fever, night sweats or weight  changes Cardiovascular: No chest pain, dyspnea on exertion, edema, orthopnea, palpitations, paroxysmal nocturnal dyspnea Dermatological: No rash, lesions or masses Respiratory: No cough, dyspnea Urologic: No hematuria, dysuria Abdominal: No nausea, vomiting, diarrhea, bright red blood per rectum, melena, or hematemesis Neurologic: No visual changes, weakness, changes in mental status All other systems reviewed and are otherwise negative  except as noted above.  Physical Exam Vitals: Blood pressure 124/70, pulse 60, height 5\' 1"  (1.549 m), weight 114 lb 1.9 oz (51.764 kg).  General: Well developed, well appearing 79 y.o. female in no acute distress. HEENT: Normocephalic, atraumatic. EOMs intact. Sclera nonicteric. Oropharynx clear.  Neck: Supple without bruits. No JVD. Lungs: Respirations regular and unlabored, CTA bilaterally. No wheezes, rales or rhonchi. Heart: RRR. S1, S2 present. No murmurs, rub, S3 or S4. Abdomen: Soft, non-distended.  Extremities: No clubbing, cyanosis or edema. DP/PT/Radials 2+ and equal bilaterally. Psych: Normal affect. Neuro: Alert and oriented X 3. Moves all extremities spontaneously.   Diagnostics Echocardiogram  Study Conclusions - Left ventricle: The cavity size was normal. Wall thickness was increased in a pattern of mild LVH. Systolic function was severely reduced. The estimated ejection fraction was in the range of 25% to 30%. There is akinesis of the anteroseptal and apical myocardium. There is akinesis of the mid-distalinferior myocardium. Doppler parameters are consistent with abnormal left ventricular relaxation (grade 1 diastolic dysfunction). Doppler parameters are consistent with high ventricular filling pressure. - Mitral valve: Calcified annulus. - Left atrium: The atrium was moderately dilated. - Pulmonary arteries: Systolic pressure was mildly to moderately increased. PA peak pressure: 52mm Hg (S).  Lexiscan Myoview  02/11/2013 IMPRESSION: Fixed defect in the anterior wall. No stress induced ischemia.    Assessment and Plan 1. New LV dysfunction with recent acute systolic HF  - Stable without HF symptoms reported today; euvolemic by exam - Echo shows severe LV dysfunction with anteroseptal, apical and distal inferior akinesis - Continue low dose beta blocker  - ACEI stopped due to hypotension - Continue Lasix daily as needed  - Counseled Ms. Gioffre and her family regarding diagnosis of HF, optimization of medical therapy and importance of compliance with low sodium diet and daily weights   - Return for follow-up with Dr. Jens Som in 3-4 weeks 2. Known CAD s/p recent NSTEMI  - Stable without anginal symptoms - Continue medical therapy with aspirin, Plavix, BB and statin  3. History of atrial fibrillation - Continue beta blocker.  - Based on notes anticoagulation has been withheld due to increased fall risk   Signed, Rick Duff, PA-C 02/19/2013, 11:29 PM

## 2013-02-24 ENCOUNTER — Other Ambulatory Visit: Payer: Self-pay | Admitting: Internal Medicine

## 2013-02-25 NOTE — Telephone Encounter (Signed)
[facility]ed script [facility] to [facility].../lmb 

## 2013-02-26 ENCOUNTER — Telehealth: Payer: Self-pay | Admitting: *Deleted

## 2013-02-26 NOTE — Telephone Encounter (Signed)
Spoke with pt advised of MDs message 

## 2013-02-26 NOTE — Telephone Encounter (Signed)
Based on these readings, dizziness is not related to orthostatic blood pressure. I advise office visit for further evaluation of symptoms as needed

## 2013-02-26 NOTE — Telephone Encounter (Signed)
Dorene Sorrow from [facility] called states pt has been complaining of dizziness.  Orthostatics are as follows:  145/70 sitting, 120/70 standing, 130/62 lying.  Please advise

## 2013-03-07 ENCOUNTER — Telehealth: Payer: Self-pay | Admitting: *Deleted

## 2013-03-07 MED ORDER — HYDROCODONE-ACETAMINOPHEN 5-325 MG PO TABS: 1.0000 | ORAL_TABLET | Freq: Four times a day (QID) | ORAL | Status: DC | PRN

## 2013-03-07 NOTE — Telephone Encounter (Signed)
Refill with #120/mo done

## 2013-03-07 NOTE — Telephone Encounter (Signed)
[facility]ed script [facility] to [facility].../lmb 

## 2013-03-07 NOTE — Telephone Encounter (Signed)
Received fax stating pt is wanting refills on her hydrocodone. Pt states she has been taking 4 per day. Pls advise,,,,lmb

## 2013-03-14 ENCOUNTER — Telehealth: Payer: Self-pay | Admitting: *Deleted

## 2013-03-14 MED ORDER — LIDOCAINE 5 % EX PTCH: 2.0000 | MEDICATED_PATCH | CUTANEOUS | Status: DC

## 2013-03-14 NOTE — Telephone Encounter (Signed)
Left msg on vm stating almost out of her lidoderm patches wanting refills sent to pharmacy...[name]

## 2013-03-14 NOTE — Telephone Encounter (Signed)
ok 

## 2013-03-14 NOTE — Telephone Encounter (Signed)
Called pt no answer LMOM rx sent to [facility]...lmb

## 2013-03-18 ENCOUNTER — Ambulatory Visit: Payer: Medicare Other | Admitting: Cardiology

## 2013-03-20 ENCOUNTER — Encounter: Payer: Self-pay | Admitting: Cardiology

## 2013-03-20 ENCOUNTER — Ambulatory Visit (INDEPENDENT_AMBULATORY_CARE_PROVIDER_SITE_OTHER): Payer: Medicare Other | Admitting: Cardiology

## 2013-03-20 VITALS — BP 110/78 | HR 64 | Wt 114.0 lb

## 2013-03-20 DIAGNOSIS — I4891 Unspecified atrial fibrillation: Secondary | ICD-10-CM

## 2013-03-20 DIAGNOSIS — I509 Heart failure, unspecified: Secondary | ICD-10-CM

## 2013-03-20 LAB — BASIC METABOLIC PANEL
BUN: 20 mg/dL (ref 6–23)
CO2: 31 mEq/L (ref 19–32)
Chloride: 102 mEq/L (ref 96–112)
Glucose, Bld: 80 mg/dL (ref 70–99)
Potassium: 4 mEq/L (ref 3.5–5.1)
Sodium: 139 mEq/L (ref 135–145)

## 2013-03-20 NOTE — Patient Instructions (Addendum)
Your physician has requested that you have an echocardiogram. Echocardiography is a painless test that uses sound waves to create images of your heart. It provides your doctor with information about the size and shape of your heart and how well your heart's chambers and valves are working. This procedure takes approximately one hour. There are no restrictions for this procedure.  Your physician recommends that you continue on your current medications as directed. Please refer to the Current Medication list given to you today.  Your physician recommends that you return for lab work in: today  Your physician recommends that you schedule a follow-up appointment in: 06/19/13 @ 11 am

## 2013-03-20 NOTE — Assessment & Plan Note (Signed)
Continue statin. 

## 2013-03-20 NOTE — Assessment & Plan Note (Addendum)
Continue aspirin and statin. Complete one year of Plavix given recent infarct.

## 2013-03-20 NOTE — Assessment & Plan Note (Signed)
Blood pressure controlled. Continue present medications. 

## 2013-03-20 NOTE — Assessment & Plan Note (Signed)
Patient remains in sinus rhythm on examination. Continue aspirin and beta blocker. She has felt not to be a candidate for Coumadin previously because of falls. I will obtain her records from [facility] to review.

## 2013-03-20 NOTE — Progress Notes (Signed)
HPI: followup coronary artery disease and congestive heart failure. Patient has had previous coronary artery bypassing graft. She was recently admitted following an epidural injection. She was in congestive heart failure and troponin elevated. Echocardiogram in August of 2014 showed an ejection fraction of 25-30%. There was akinesis of the anteroseptal and apical myocardium and there was concern for possible takotsubo. The left atrium was moderately dilated. Given her age conservative measures was felt to be appropriate if possible. Patient improved with medical therapy. Myoview in August of 2014 showed an ejection fraction of 49%. There was infarct but no ischemia. Note she has a history of atrial fibrillation but is not on anticoagulation given history of fall risk. Since discharge she has not had significant dyspnea on exertion, orthopnea, PND, pedal edema, chest pain or syncope. 2 days ago she did notice increased dyspnea and lower extremity edema. She took Lasix with improvement.  Current Outpatient Prescriptions  Medication Sig Dispense Refill  . ALPRAZolam (XANAX) 0.25 MG tablet Take 1 tablet (0.25 mg total) by mouth at bedtime as needed for sleep or anxiety.  30 tablet  5  . Ascorbic Acid (VITAMIN C) 500 MG tablet Take 1,000 mg by mouth daily.       [name] aspirin 81 MG tablet Take 81 mg by mouth every morning.       . Cholecalciferol (VITAMIN D3) 1000 UNITS CAPS Take by mouth daily.        . clopidogrel (PLAVIX) 75 MG tablet Take 1 tablet (75 mg total) by mouth daily with breakfast.  30 tablet  6  . docusate sodium (COLACE) 100 MG capsule Take 100 mg by mouth 2 (two) times daily.       . fentaNYL (DURAGESIC) 50 MCG/HR Place 1 patch (50 mcg total) onto the skin every 3 (three) days.  10 patch  0  . furosemide (LASIX) 40 MG tablet Take 40 mg by mouth daily as needed for fluid or edema.   60 tablet  5  . HYDROcodone-acetaminophen (NORCO/VICODIN) 5-325 MG per tablet Take 1 tablet by mouth every 6  (six) hours as needed for pain.  120 tablet  5  . lidocaine (LIDODERM) 5 % Place 2 patches onto the skin daily. Remove & Discard patch within 12 hours or as directed by MD  60 patch  5  . metoprolol tartrate (LOPRESSOR) 25 MG tablet Take 0.5 tablets (12.5 mg total) by mouth 2 (two) times daily.  30 tablet  6  . Multiple Vitamin (MULTIVITAMIN WITH MINERALS) TABS Take 1 tablet by mouth daily.      . nitroGLYCERIN (NITROSTAT) 0.4 MG SL tablet Place 1 tablet (0.4 mg total) under the tongue every 5 (five) minutes as needed for chest pain (upt o 3 doses).  25 tablet  4  . Omega-3 Fatty Acids (FISH OIL) 1000 MG CAPS Take by mouth daily.       . pantoprazole (PROTONIX) 40 MG tablet Take 1 tablet (40 mg total) by mouth daily.  30 tablet  6  . polyethylene glycol (MIRALAX / GLYCOLAX) packet Take 17 g by mouth every morning.      . pregabalin (LYRICA) 50 MG capsule Take 50 mg by mouth 3 (three) times daily.      . Probiotic Product (PROBIOTIC DAILY PO) Take 1 each by mouth daily as needed (for GI tract).      . simvastatin (ZOCOR) 40 MG tablet TAKE ONE TABLET AT BEDTIME  30 tablet  0  . traMADol (ULTRAM) 50  MG tablet Take 50 mg by mouth every 6 (six) hours as needed for pain.      [name] venlafaxine XR (EFFEXOR-XR) 75 MG 24 hr capsule TAKE ONE CAPSULE EACH DAY  30 capsule  0  . zolpidem (AMBIEN) 5 MG tablet Take 5 mg by mouth at bedtime as needed for sleep.       No current facility-administered medications for this visit.     Past Medical History  Diagnosis Date  . SPINAL STENOSIS   . LOW BACK PAIN     chronic, follows with Nsurg for ESI  . FRACTURE, PELVIS, RIGHT 12/2009  . HIP FRACTURE, LEFT 09/2009    s/p L THA  . ALLERGIC RHINITIS   . CHF (congestive heart failure)   . Carpal tunnel syndrome   . Hypertension   . HYPERLIPIDEMIA   . GERD   . Arthritis   . Osteoporosis   . CORONARY ARTERY DISEASE 2004    s/p CABG  . Atrial fibrillation     no anticoag due to fall risk/age  . DEPRESSION      Past Surgical History  Procedure Laterality Date  . (l) hip hemiarthroplasty  09/2009  . Appendectomy  1940  . Eye surgery      both cataracts  . Coronary artery bypass graft  2004  . Carpal tunnel release  2004    right and left-dsc  . Ulnar nerve transposition  2009    lt   . Vein ligation      legs  . Colonoscopy    . I&d extremity Right 12/26/2012    Procedure: IRRIGATION AND DEBRIDEMENT OF RIGHT LEG, SURGICAL PREP PLACEMENT OF A CELL AND VAC   ;  Surgeon: Wayland Denis, DO;  Location: [facility];  Service: Plastics;  Laterality: Right;    History   Social History  . Marital Status: Divorced    Spouse Name: N/A    Number of Children: N/A  . Years of Education: N/A   Occupational History  . Not on file.   Social History Main Topics  . Smoking status: Former Smoker    Quit date: 07/10/1978  . Smokeless tobacco: Never Used     Comment: Lives alone in indep living facility @ [place]. Active ADLS but does not drive dur to macular degen. supportive family members nearby  . Alcohol Use: No  . Drug Use: No  . Sexual Activity: No   Other Topics Concern  . Not on file   Social History Narrative  . No narrative on file    ROS: no fevers or chills, productive cough, hemoptysis, dysphasia, odynophagia, melena, hematochezia, dysuria, hematuria, rash, seizure activity, orthopnea, PND, pedal edema, claudication. Remaining systems are negative.  Physical Exam: Well-developed frail in no acute distress.  Skin is warm and dry.  HEENT is normal.  Neck is supple.  Chest is clear to auscultation with normal expansion.  Cardiovascular exam is regular rate and rhythm.  Abdominal exam nontender or distended. No masses palpated. Extremities show no edema. neuro grossly intact

## 2013-03-20 NOTE — Assessment & Plan Note (Signed)
Patient is euvolemic on examination. Continue Lasix as needed. Her recent LV function was reduced on echocardiogram. There was a question of takotsubo cardiomyopathy. Her nuclear study showed no ischemia. Plan repeat echocardiogram to see if LV function has improved. Continue beta blocker. I will not add an ACE inhibitor at this point as her blood pressure is borderline. We will consider if her LV function remains decreased. Check potassium and renal function.

## 2013-03-24 ENCOUNTER — Other Ambulatory Visit: Payer: Self-pay | Admitting: Internal Medicine

## 2013-03-25 ENCOUNTER — Other Ambulatory Visit: Payer: Self-pay | Admitting: *Deleted

## 2013-03-25 MED ORDER — FENTANYL 50 MCG/HR TD PT72: 1.0000 | MEDICATED_PATCH | TRANSDERMAL | Status: DC

## 2013-03-25 NOTE — Telephone Encounter (Signed)
Notified pt rx ready for pick-up.../lmb 

## 2013-03-25 NOTE — Telephone Encounter (Signed)
Requesting refill on her fentanyl patches. MD out of office. Pls advise on refill...lmb

## 2013-03-31 ENCOUNTER — Encounter: Payer: Self-pay | Admitting: Internal Medicine

## 2013-03-31 ENCOUNTER — Ambulatory Visit (INDEPENDENT_AMBULATORY_CARE_PROVIDER_SITE_OTHER): Payer: Medicare Other | Admitting: Internal Medicine

## 2013-03-31 VITALS — BP 138/80 | HR 90 | Temp 97.7°F | Wt 111.5 lb

## 2013-03-31 DIAGNOSIS — N3289 Other specified disorders of bladder: Secondary | ICD-10-CM

## 2013-03-31 DIAGNOSIS — R3989 Other symptoms and signs involving the genitourinary system: Secondary | ICD-10-CM

## 2013-03-31 DIAGNOSIS — N390 Urinary tract infection, site not specified: Secondary | ICD-10-CM

## 2013-03-31 DIAGNOSIS — R110 Nausea: Secondary | ICD-10-CM

## 2013-03-31 DIAGNOSIS — R300 Dysuria: Secondary | ICD-10-CM

## 2013-03-31 LAB — POCT URINALYSIS DIPSTICK
Bilirubin, UA: NEGATIVE
Blood, UA: NEGATIVE
Glucose, UA: NEGATIVE
Ketones, UA: NEGATIVE
Spec Grav, UA: 1.01
pH, UA: 5

## 2013-03-31 MED ORDER — CIPROFLOXACIN HCL 500 MG PO TABS: 500.0000 mg | ORAL_TABLET | Freq: Two times a day (BID) | ORAL | Status: DC

## 2013-03-31 NOTE — Progress Notes (Signed)
HPI  Pt presents to the clinic today with c/o bladder pressure, dysuria, back pain, and nausea. This started 1 week ago. She is not sure why she feels so bad. She took some tramadol and it did seem to help the pain. She denies fever or chills. She did bring in her own urine sample.   Review of Systems  Past Medical History  Diagnosis Date  . SPINAL STENOSIS   . LOW BACK PAIN     chronic, follows with Nsurg for ESI  . FRACTURE, PELVIS, RIGHT 12/2009  . HIP FRACTURE, LEFT 09/2009    s/p L THA  . ALLERGIC RHINITIS   . CHF (congestive heart failure)   . Carpal tunnel syndrome   . Hypertension   . HYPERLIPIDEMIA   . GERD   . Arthritis   . Osteoporosis   . CORONARY ARTERY DISEASE 2004    s/p CABG  . Atrial fibrillation     no anticoag due to fall risk/age  . DEPRESSION     Family History  Problem Relation Age of Onset  . Arthritis Mother   . Breast cancer Mother   . Heart disease Mother   . Hypertension Mother   . Arthritis Father   . Heart disease Father   . Hypertension Father   . Arthritis Maternal Grandmother   . Arthritis Maternal Grandfather   . Arthritis Paternal Grandmother   . Arthritis Paternal Grandfather   . Breast cancer Daughter     History   Social History  . Marital Status: Divorced    Spouse Name: N/A    Number of Children: N/A  . Years of Education: N/A   Occupational History  . Not on file.   Social History Main Topics  . Smoking status: Former Smoker    Quit date: 07/10/1978  . Smokeless tobacco: Never Used     Comment: Lives alone in indep living facility @ [facility]. Active ADLS but does not drive dur to macular degen. supportive family members nearby  . Alcohol Use: No  . Drug Use: No  . Sexual Activity: No   Other Topics Concern  . Not on file   Social History Narrative  . No narrative on file    Allergies  Allergen Reactions  . Amoxicillin Nausea Only    Upset stomach with high dose  . Sporanox [Itraconazole] Hives  .  Sulfa Antibiotics Nausea Only    Constitutional: Denies fever, malaise, fatigue, headache or abrupt weight changes.   GU: Pt reports urgency, frequency and pain with urination. Denies burning sensation, blood in urine, odor or discharge. Skin: Denies redness, rashes, lesions or ulcercations.   No other specific complaints in a complete review of systems (except as listed in HPI above).    Objective:   Physical Exam  BP 138/80  Pulse 90  Temp(Src) 97.7 F (36.5 C) (Oral)  Wt 111 lb 8 oz (50.576 kg)  BMI 21.08 kg/m2  SpO2 90% Wt Readings from Last 3 Encounters:  03/31/13 111 lb 8 oz (50.576 kg)  03/20/13 114 lb (51.71 kg)  02/18/13 114 lb 1.9 oz (51.764 kg)    General: Appears her stated age, well developed, well nourished in NAD. Cardiovascular: Normal rate and rhythm. S1,S2 noted.  No murmur, rubs or gallops noted. No JVD or BLE edema. No carotid bruits noted. Pulmonary/Chest: Normal effort and positive vesicular breath sounds. No respiratory distress. No wheezes, rales or ronchi noted.  Abdomen: Soft and nontender. Normal bowel sounds, no bruits noted. No  distention or masses noted. Liver, spleen and kidneys non palpable. Tender to palpation over the bladder area. No CVA tenderness.      Assessment & Plan:   Dysuria, back pain, bladder pressure, nausea secondary to UTI:  Urinalysis and urine culture eRx sent if for Cipro 500 mg BID x 5 days eRx sent in for Pyridium 200 mg TID prn Drink plenty of fluids  RTC as needed or if symptoms persist.

## 2013-03-31 NOTE — Patient Instructions (Signed)
Urinary Tract Infection  Urinary tract infections (UTIs) can develop anywhere along your urinary tract. Your urinary tract is your body's drainage system for removing wastes and extra water. Your urinary tract includes two kidneys, two ureters, a bladder, and a urethra. Your kidneys are a pair of bean-shaped organs. Each kidney is about the size of your fist. They are located below your ribs, one on each side of your spine.  CAUSES  Infections are caused by microbes, which are microscopic organisms, including fungi, viruses, and bacteria. These organisms are so small that they can only be seen through a microscope. Bacteria are the microbes that most commonly cause UTIs.  SYMPTOMS   Symptoms of UTIs may vary by age and gender of the patient and by the location of the infection. Symptoms in young women typically include a frequent and intense urge to urinate and a painful, burning feeling in the bladder or urethra during urination. Older women and men are more likely to be tired, shaky, and weak and have muscle aches and abdominal pain. A fever may mean the infection is in your kidneys. Other symptoms of a kidney infection include pain in your back or sides below the ribs, nausea, and vomiting.  DIAGNOSIS  To diagnose a UTI, your caregiver will ask you about your symptoms. Your caregiver also will ask to provide a urine sample. The urine sample will be tested for bacteria and white blood cells. White blood cells are made by your body to help fight infection.  TREATMENT   Typically, UTIs can be treated with medication. Because most UTIs are caused by a bacterial infection, they usually can be treated with the use of antibiotics. The choice of antibiotic and length of treatment depend on your symptoms and the type of bacteria causing your infection.  HOME CARE INSTRUCTIONS   If you were prescribed antibiotics, take them exactly as your caregiver instructs you. Finish the medication even if you feel better after you  have only taken some of the medication.   Drink enough water and fluids to keep your urine clear or pale yellow.   Avoid caffeine, tea, and carbonated beverages. They tend to irritate your bladder.   Empty your bladder often. Avoid holding urine for long periods of time.   Empty your bladder before and after sexual intercourse.   After a bowel movement, women should cleanse from front to back. Use each tissue only once.  SEEK MEDICAL CARE IF:    You have back pain.   You develop a fever.   Your symptoms do not begin to resolve within 3 days.  SEEK IMMEDIATE MEDICAL CARE IF:    You have severe back pain or lower abdominal pain.   You develop chills.   You have nausea or vomiting.   You have continued burning or discomfort with urination.  MAKE SURE YOU:    Understand these instructions.   Will watch your condition.   Will get help right away if you are not doing well or get worse.  Document Released: 04/05/2005 Document Revised: 12/26/2011 Document Reviewed: 08/04/2011  [employer] Patient Information 2014 [employer].

## 2013-04-04 ENCOUNTER — Ambulatory Visit (HOSPITAL_COMMUNITY): Payer: Medicare Other | Attending: Cardiology

## 2013-04-04 DIAGNOSIS — I079 Rheumatic tricuspid valve disease, unspecified: Secondary | ICD-10-CM | POA: Insufficient documentation

## 2013-04-04 DIAGNOSIS — I379 Nonrheumatic pulmonary valve disorder, unspecified: Secondary | ICD-10-CM | POA: Insufficient documentation

## 2013-04-04 DIAGNOSIS — I059 Rheumatic mitral valve disease, unspecified: Secondary | ICD-10-CM | POA: Insufficient documentation

## 2013-04-04 DIAGNOSIS — I509 Heart failure, unspecified: Secondary | ICD-10-CM | POA: Insufficient documentation

## 2013-04-04 NOTE — Progress Notes (Signed)
Echocardiogram performed.  

## 2013-04-06 ENCOUNTER — Other Ambulatory Visit: Payer: Self-pay | Admitting: Internal Medicine

## 2013-04-09 ENCOUNTER — Encounter: Payer: Self-pay | Admitting: Internal Medicine

## 2013-04-10 ENCOUNTER — Telehealth: Payer: Self-pay | Admitting: *Deleted

## 2013-04-10 DIAGNOSIS — N390 Urinary tract infection, site not specified: Secondary | ICD-10-CM

## 2013-04-10 MED ORDER — CIPROFLOXACIN HCL 500 MG PO TABS: 500.0000 mg | ORAL_TABLET | Freq: Two times a day (BID) | ORAL | Status: DC

## 2013-04-10 NOTE — Telephone Encounter (Signed)
Pt states she saw [facility] for UTI. Was rx cipro but she still having urine frequency & burning sensation. Think UTI is not cleared up. Wanting to get another round of cipro. Is this ok regina & i will call her back...[name]

## 2013-04-10 NOTE — Telephone Encounter (Signed)
Notified pt Julia Black ok refill. Sent cipro to [facility]...lmb

## 2013-04-10 NOTE — Telephone Encounter (Signed)
yes

## 2013-04-21 ENCOUNTER — Encounter: Payer: Self-pay | Admitting: Internal Medicine

## 2013-04-22 ENCOUNTER — Other Ambulatory Visit: Payer: Self-pay | Admitting: *Deleted

## 2013-04-22 MED ORDER — LIDOCAINE 5 % EX PTCH: 2.0000 | MEDICATED_PATCH | CUTANEOUS | Status: DC

## 2013-04-22 NOTE — Telephone Encounter (Signed)
Notified pt rx was sent to [facility]....lmb

## 2013-04-22 NOTE — Telephone Encounter (Signed)
Left msg on vm requesting refill on her lidoderm patches...[name]

## 2013-04-25 ENCOUNTER — Telehealth: Payer: Self-pay | Admitting: *Deleted

## 2013-04-25 DIAGNOSIS — N390 Urinary tract infection, site not specified: Secondary | ICD-10-CM

## 2013-04-25 MED ORDER — CIPROFLOXACIN HCL 500 MG PO TABS: 500.0000 mg | ORAL_TABLET | Freq: Two times a day (BID) | ORAL | Status: DC

## 2013-04-25 NOTE — Telephone Encounter (Signed)
Left msg on vm stating her children is out of town pretty sure she has a UTI again, or didn't clear up the first time. Having urine frequency & burning when she urinates. Wanting to see will md send her antibiotic. Pls advise...[name]

## 2013-04-25 NOTE — Telephone Encounter (Signed)
Per Dr. Felicity Coyer ok to resend cipro that regina rx couple weeks ago. Notified pt sent cipro to [facility]...lmb

## 2013-05-01 ENCOUNTER — Other Ambulatory Visit: Payer: Self-pay | Admitting: Internal Medicine

## 2013-05-05 ENCOUNTER — Other Ambulatory Visit: Payer: Self-pay | Admitting: *Deleted

## 2013-05-05 MED ORDER — PREGABALIN 50 MG PO CAPS: 50.0000 mg | ORAL_CAPSULE | Freq: Three times a day (TID) | ORAL | Status: DC

## 2013-05-05 NOTE — Telephone Encounter (Signed)
[facility]ed script [facility] to [facility].../lmb 

## 2013-05-06 ENCOUNTER — Telehealth: Payer: Self-pay | Admitting: *Deleted

## 2013-05-06 MED ORDER — FENTANYL 50 MCG/HR TD PT72: 1.0000 | MEDICATED_PATCH | TRANSDERMAL | Status: DC

## 2013-05-06 NOTE — Telephone Encounter (Signed)
Left msg on vm needing refill on fentanyl patches.Marland Kitchenlmb

## 2013-05-06 NOTE — Telephone Encounter (Signed)
ok 

## 2013-05-06 NOTE — Telephone Encounter (Signed)
Notified pt rx ready for pick-up.../lmb 

## 2013-05-20 ENCOUNTER — Other Ambulatory Visit: Payer: Self-pay | Admitting: Internal Medicine

## 2013-05-21 NOTE — Telephone Encounter (Signed)
[facility]ed script [facility] to [facility].../lmb 

## 2013-05-22 ENCOUNTER — Other Ambulatory Visit: Payer: Self-pay | Admitting: *Deleted

## 2013-05-22 MED ORDER — LIDOCAINE 5 % EX PTCH: 2.0000 | MEDICATED_PATCH | CUTANEOUS | Status: DC

## 2013-05-22 NOTE — Telephone Encounter (Signed)
Left msg on vm stating she is needing refill on her lidoderm patches. Called pt back inform her will send to [facility]...lmb

## 2013-05-27 ENCOUNTER — Other Ambulatory Visit: Payer: Medicare Other

## 2013-05-27 ENCOUNTER — Ambulatory Visit (INDEPENDENT_AMBULATORY_CARE_PROVIDER_SITE_OTHER): Payer: Medicare Other | Admitting: Internal Medicine

## 2013-05-27 ENCOUNTER — Encounter: Payer: Self-pay | Admitting: Internal Medicine

## 2013-05-27 VITALS — BP 116/50 | HR 68 | Temp 99.8°F | Wt 115.4 lb

## 2013-05-27 DIAGNOSIS — R300 Dysuria: Secondary | ICD-10-CM

## 2013-05-27 DIAGNOSIS — R350 Frequency of micturition: Secondary | ICD-10-CM

## 2013-05-27 DIAGNOSIS — N390 Urinary tract infection, site not specified: Secondary | ICD-10-CM

## 2013-05-27 DIAGNOSIS — Z7189 Other specified counseling: Secondary | ICD-10-CM

## 2013-05-27 LAB — POCT URINALYSIS DIPSTICK
Glucose, UA: NEGATIVE
Urobilinogen, UA: NEGATIVE

## 2013-05-27 MED ORDER — LEVOFLOXACIN 500 MG PO TABS: 500.0000 mg | ORAL_TABLET | Freq: Every day | ORAL | Status: DC

## 2013-05-27 NOTE — Progress Notes (Signed)
Subjective:    Patient ID: Julia Black, female    DOB: [birth year], 79 y.o.   MRN: [id number]  HPI Mrs. Jagoda felt like she had a urinary track infection: frequency, dysuria, feeling ill. NO recorded fever, mild nausea, post-void bladder spasm. She does admit to flank pain bilaterally.  Past Medical History  Diagnosis Date  . SPINAL STENOSIS   . LOW BACK PAIN     chronic, follows with Nsurg for ESI  . FRACTURE, PELVIS, RIGHT 12/2009  . HIP FRACTURE, LEFT 09/2009    s/p L THA  . ALLERGIC RHINITIS   . CHF (congestive heart failure)   . Carpal tunnel syndrome   . Hypertension   . HYPERLIPIDEMIA   . GERD   . Arthritis   . Osteoporosis   . CORONARY ARTERY DISEASE 2004    s/p CABG  . Atrial fibrillation     no anticoag due to fall risk/age  . DEPRESSION    Past Surgical History  Procedure Laterality Date  . (l) hip hemiarthroplasty  09/2009  . Appendectomy  1940  . Eye surgery      both cataracts  . Coronary artery bypass graft  2004  . Carpal tunnel release  2004    right and left-dsc  . Ulnar nerve transposition  2009    lt   . Vein ligation      legs  . Colonoscopy    . I&d extremity Right 12/26/2012    Procedure: IRRIGATION AND DEBRIDEMENT OF RIGHT LEG, SURGICAL PREP PLACEMENT OF A CELL AND VAC   ;  Surgeon: Wayland Denis, DO;  Location: [facility];  Service: Plastics;  Laterality: Right;   Family History  Problem Relation Age of Onset  . Arthritis Mother   . Breast cancer Mother   . Heart disease Mother   . Hypertension Mother   . Arthritis Father   . Heart disease Father   . Hypertension Father   . Arthritis Maternal Grandmother   . Arthritis Maternal Grandfather   . Arthritis Paternal Grandmother   . Arthritis Paternal Grandfather   . Breast cancer Daughter    History   Social History  . Marital Status: Divorced    Spouse Name: N/A    Number of Children: N/A  . Years of Education: N/A   Occupational History  . Not on file.   Social  History Main Topics  . Smoking status: Former Smoker    Quit date: 07/10/1978  . Smokeless tobacco: Never Used     Comment: Lives alone in indep living facility @ [place]. Active ADLS but does not drive dur to macular degen. supportive family members nearby  . Alcohol Use: No  . Drug Use: No  . Sexual Activity: No   Other Topics Concern  . Not on file   Social History Narrative  . No narrative on file    Current Outpatient Prescriptions on File Prior to Visit  Medication Sig Dispense Refill  . ALPRAZolam (XANAX) 0.25 MG tablet Take 1 tablet (0.25 mg total) by mouth at bedtime as needed for sleep or anxiety.  30 tablet  5  . Ascorbic Acid (VITAMIN C) 500 MG tablet Take 1,000 mg by mouth daily.       [name] aspirin 81 MG tablet Take 81 mg by mouth every morning.       . Cholecalciferol (VITAMIN D3) 1000 UNITS CAPS Take by mouth daily.        . ciprofloxacin (CIPRO) 500 MG  tablet Take 1 tablet (500 mg total) by mouth 2 (two) times daily.  10 tablet  0  . clopidogrel (PLAVIX) 75 MG tablet Take 1 tablet (75 mg total) by mouth daily with breakfast.  30 tablet  6  . docusate sodium (COLACE) 100 MG capsule Take 100 mg by mouth 2 (two) times daily.       . fentaNYL (DURAGESIC) 50 MCG/HR Place 1 patch (50 mcg total) onto the skin every 3 (three) days.  10 patch  0  . furosemide (LASIX) 40 MG tablet Take 40 mg by mouth daily as needed for fluid or edema.   60 tablet  5  . HYDROcodone-acetaminophen (NORCO/VICODIN) 5-325 MG per tablet Take 1 tablet by mouth every 6 (six) hours as needed for pain.  120 tablet  5  . lidocaine (LIDODERM) 5 % Place 2 patches onto the skin daily. Remove & Discard patch within 12 hours or as directed by MD  60 patch  5  . metoprolol tartrate (LOPRESSOR) 25 MG tablet Take 0.5 tablets (12.5 mg total) by mouth 2 (two) times daily.  30 tablet  6  . Multiple Vitamin (MULTIVITAMIN WITH MINERALS) TABS Take 1 tablet by mouth daily.      . nitroGLYCERIN (NITROSTAT) 0.4 MG SL tablet  Place 1 tablet (0.4 mg total) under the tongue every 5 (five) minutes as needed for chest pain (upt o 3 doses).  25 tablet  4  . Omega-3 Fatty Acids (FISH OIL) 1000 MG CAPS Take by mouth daily.       . pantoprazole (PROTONIX) 40 MG tablet Take 1 tablet (40 mg total) by mouth daily.  30 tablet  6  . polyethylene glycol (MIRALAX / GLYCOLAX) packet Take 17 g by mouth every morning.      . pregabalin (LYRICA) 50 MG capsule Take 1 capsule (50 mg total) by mouth 3 (three) times daily.  90 capsule  3  . Probiotic Product (PROBIOTIC DAILY PO) Take 1 each by mouth daily as needed (for GI tract).      . simvastatin (ZOCOR) 40 MG tablet TAKE ONE TABLET AT BEDTIME  30 tablet  2  . traMADol (ULTRAM) 50 MG tablet Take 50 mg by mouth every 6 (six) hours as needed for pain.      . traMADol (ULTRAM) 50 MG tablet TAKE 1/2 TO 1 TABLET BY MOUTH EVERY 8 HOURS AS NEEDED FOR PAIN  90 tablet  1  . venlafaxine XR (EFFEXOR-XR) 75 MG 24 hr capsule TAKE ONE CAPSULE EACH DAY  30 capsule  2  . zolpidem (AMBIEN) 5 MG tablet Take 5 mg by mouth at bedtime as needed for sleep.       No current facility-administered medications on file prior to visit.      Review of Systems System review is negative for any constitutional, cardiac, pulmonary, GI or neuro symptoms or complaints other than as described in the HPI.     Objective:   Physical Exam Filed Vitals:   05/27/13 1555  BP: 116/50  Pulse: 68  Temp: 99.8 F (37.7 C)   Gen'l- very well preserved woman of 89       Assessment & Plan:  UTI - reviewed the chart: had infection in Aug '14 - enterococcus that was sensitive to nitrofurantoin and levofloxacin (not tested against cipro). She had a recurrent UA in Sept - still positive and she was treated with another round of cipro. Now presenting with recurrent infection.  Plan Will culture urine  today  Tx - levquin 500 mg once a day x 7 days.   Discussed ACP: provided signed Out of Facility Order; sample MOST form  and referred to [email or web address]

## 2013-05-27 NOTE — Progress Notes (Signed)
Pre visit review using our clinic review tool, if applicable. No additional management support is needed unless otherwise documented below in the visit note. 

## 2013-05-27 NOTE — Patient Instructions (Addendum)
UTI - reviewed the chart: had infection in Aug '14 - enterococcus that was sensitive to nitrofurantoin and levofloxacin (not tested against cipro). She had a recurrent UA in Sept - still positive and she was treated with another round of cipro. Now presenting with recurrent infection.  Plan Will culture urine today  Tx - levquin 500 mg once a day x 7 days.   Advanced care planning Please post the Out of Facility order - canary yellow with the red stop sign Consider the MOST form - fuscia pink. Check the web site [email or web address] - a guide to advanced care planning for patients and their families

## 2013-05-29 LAB — URINE CULTURE: Colony Count: >=100000

## 2013-06-12 ENCOUNTER — Telehealth: Payer: Self-pay | Admitting: *Deleted

## 2013-06-12 MED ORDER — LEVOFLOXACIN 250 MG PO TABS: 250.0000 mg | ORAL_TABLET | Freq: Every day | ORAL | Status: DC

## 2013-06-12 NOTE — Telephone Encounter (Signed)
Notified pt with md response.../lmb 

## 2013-06-12 NOTE — Telephone Encounter (Signed)
Left msg on vm stating mom is still complaining of UTI sxs. Still having pain/burning when she urinate. Saw another md 2 weeks ago gave antibiotic completed. Wanting to know what md recommend...[name]

## 2013-06-12 NOTE — Telephone Encounter (Signed)
The rx antibiotics Levaquin should have helped (based on 11/18 Ucx results -enterococcus sensitive to same) We will tx once more with 10d of same Levoflox antibiotics (erx done) If still UTI sx unimproved after 10d more abx, will need repeat Ucx

## 2013-06-17 ENCOUNTER — Telehealth: Payer: Self-pay | Admitting: *Deleted

## 2013-06-17 MED ORDER — LIDOCAINE 5 % EX PTCH: 2.0000 | MEDICATED_PATCH | CUTANEOUS | Status: DC

## 2013-06-17 MED ORDER — FENTANYL 50 MCG/HR TD PT72: 50.0000 ug | MEDICATED_PATCH | TRANSDERMAL | Status: DC

## 2013-06-17 NOTE — Telephone Encounter (Signed)
Notified pt rx ready for pick-up.../lmb 

## 2013-06-17 NOTE — Telephone Encounter (Signed)
Ok done

## 2013-06-17 NOTE — Telephone Encounter (Signed)
Left msg on vm requesting refill on her fentanyl patch & lidoderm patch.Marland KitchenRaechel Chute

## 2013-06-18 ENCOUNTER — Other Ambulatory Visit: Payer: Self-pay | Admitting: Internal Medicine

## 2013-06-19 ENCOUNTER — Encounter: Payer: Self-pay | Admitting: Cardiology

## 2013-06-19 ENCOUNTER — Ambulatory Visit (INDEPENDENT_AMBULATORY_CARE_PROVIDER_SITE_OTHER): Payer: Medicare Other | Admitting: Cardiology

## 2013-06-19 VITALS — BP 128/62 | HR 53 | Ht 61.0 in | Wt 118.0 lb

## 2013-06-19 DIAGNOSIS — I5023 Acute on chronic systolic (congestive) heart failure: Secondary | ICD-10-CM

## 2013-06-19 DIAGNOSIS — E785 Hyperlipidemia, unspecified: Secondary | ICD-10-CM

## 2013-06-19 DIAGNOSIS — I214 Non-ST elevation (NSTEMI) myocardial infarction: Secondary | ICD-10-CM

## 2013-06-19 DIAGNOSIS — I4891 Unspecified atrial fibrillation: Secondary | ICD-10-CM

## 2013-06-19 DIAGNOSIS — I10 Essential (primary) hypertension: Secondary | ICD-10-CM

## 2013-06-19 DIAGNOSIS — I509 Heart failure, unspecified: Secondary | ICD-10-CM

## 2013-06-19 MED ORDER — FUROSEMIDE 20 MG PO TABS: ORAL_TABLET | ORAL | Status: DC

## 2013-06-19 NOTE — Assessment & Plan Note (Signed)
Continue statin. 

## 2013-06-19 NOTE — Assessment & Plan Note (Signed)
Patient remains in sinus rhythm.continue beta blocker and aspirin. Not on Coumadin given history of falls.

## 2013-06-19 NOTE — Telephone Encounter (Signed)
[facility]ed script [facility] to [facility].../lmb 

## 2013-06-19 NOTE — Assessment & Plan Note (Signed)
Continue aspirin and statin. LV function has now normalized. Previous events felt secondary to takotsubo cardiomyopathy.

## 2013-06-19 NOTE — Assessment & Plan Note (Signed)
She has some volume excess. Begin Lasix 20 mg Monday Wednesday Friday. Take an additional 40 mg daily as needed. Check potassium and renal function in one week.

## 2013-06-19 NOTE — Patient Instructions (Addendum)
Your physician wants you to follow-up in: 6 MONTHS WITH DR Jens Som You will receive a reminder letter in the mail two months in advance. If you don't receive a letter, please call our office to schedule the follow-up appointment.   TAKE FUROSEMIDE 20 MG TAKE ONE Monday, Wednesday OR Friday  TAKE EXTRA FUROSEMIDE AS NEEDED FOR SWELLING OR SHORTNESS OF BREATH  Your physician recommends that you return for lab work in: ONE WEEK

## 2013-06-19 NOTE — Assessment & Plan Note (Signed)
Blood pressure controlled. Continue present medications. 

## 2013-06-19 NOTE — Progress Notes (Signed)
HPI: FU coronary artery disease and congestive heart failure. Patient has had previous coronary artery bypassing graft. She was admitted in August 2014 following an epidural injection. She was in congestive heart failure and troponin elevated. Echocardiogram in August of 2014 showed an ejection fraction of 25-30%. There was akinesis of the anteroseptal and apical myocardium and there was concern for possible takotsubo. The left atrium was moderately dilated. Given her age conservative measures was felt to be appropriate if possible. Patient improved with medical therapy. Myoview in August of 2014 showed an ejection fraction of 49%. There was infarct but no ischemia. Echocardiogram repeated in September of 2014. LV function normal. Grade 2 diastolic dysfunction. Mild I. Leaflet prolapse with mild to moderate mitral regurgitation. Biatrial enlargement. Note she has a history of atrial fibrillation but is not on anticoagulation given history of fall risk. I last saw her in September of 2014. Since then, over the past week she has noticed some fluid retention. She has mild dyspnea on exertion but no orthopnea or PND. Mild pedal edema. No chest pain or syncope.  Current Outpatient Prescriptions  Medication Sig Dispense Refill  . ALPRAZolam (XANAX) 0.25 MG tablet Take 1 tablet (0.25 mg total) by mouth at bedtime as needed for sleep or anxiety.  30 tablet  5  . Ascorbic Acid (VITAMIN C) 500 MG tablet Take 1,000 mg by mouth daily.       [name] aspirin 81 MG tablet Take 81 mg by mouth every morning.       . Cholecalciferol (VITAMIN D3) 1000 UNITS CAPS Take by mouth daily.        . clopidogrel (PLAVIX) 75 MG tablet Take 1 tablet (75 mg total) by mouth daily with breakfast.  30 tablet  6  . docusate sodium (COLACE) 100 MG capsule Take 100 mg by mouth 2 (two) times daily.       . fentaNYL (DURAGESIC) 50 MCG/HR Place 1 patch (50 mcg total) onto the skin every 3 (three) days.  10 patch  0  . furosemide (LASIX) 40  MG tablet Take 40 mg by mouth daily as needed for fluid or edema.   60 tablet  5  . HYDROcodone-acetaminophen (NORCO/VICODIN) 5-325 MG per tablet Take 1 tablet by mouth every 6 (six) hours as needed for pain.  120 tablet  5  . levofloxacin (LEVAQUIN) 250 MG tablet Take 1 tablet (250 mg total) by mouth daily.  10 tablet  0  . lidocaine (LIDODERM) 5 % Place 2 patches onto the skin daily. Remove & Discard patch within 12 hours or as directed by MD  60 patch  5  . metoprolol tartrate (LOPRESSOR) 25 MG tablet Take 0.5 tablets (12.5 mg total) by mouth 2 (two) times daily.  30 tablet  6  . Multiple Vitamin (MULTIVITAMIN WITH MINERALS) TABS Take 1 tablet by mouth daily.      . nitroGLYCERIN (NITROSTAT) 0.4 MG SL tablet Place 1 tablet (0.4 mg total) under the tongue every 5 (five) minutes as needed for chest pain (upt o 3 doses).  25 tablet  4  . Omega-3 Fatty Acids (FISH OIL) 1000 MG CAPS Take by mouth daily.       . pantoprazole (PROTONIX) 40 MG tablet Take 1 tablet (40 mg total) by mouth daily.  30 tablet  6  . polyethylene glycol (MIRALAX / GLYCOLAX) packet Take 17 g by mouth every morning.      . pregabalin (LYRICA) 50 MG capsule Take 1  capsule (50 mg total) by mouth 3 (three) times daily.  90 capsule  3  . Probiotic Product (PROBIOTIC DAILY PO) Take 1 each by mouth daily as needed (for GI tract).      . simvastatin (ZOCOR) 40 MG tablet TAKE ONE TABLET AT BEDTIME  30 tablet  2  . traMADol (ULTRAM) 50 MG tablet Take 50 mg by mouth every 6 (six) hours as needed for pain.      . traMADol (ULTRAM) 50 MG tablet TAKE 1/2 TO 1 TABLET BY MOUTH EVERY 8 HOURS AS NEEDED FOR PAIN  90 tablet  1  . venlafaxine XR (EFFEXOR-XR) 75 MG 24 hr capsule TAKE ONE CAPSULE EACH DAY  30 capsule  2  . zolpidem (AMBIEN) 5 MG tablet TAKE ONE TABLET AT BEDTIME AS NEEDED  30 tablet  3   No current facility-administered medications for this visit.     Past Medical History  Diagnosis Date  . SPINAL STENOSIS   . LOW BACK PAIN       chronic, follows with Nsurg for ESI  . FRACTURE, PELVIS, RIGHT 12/2009  . HIP FRACTURE, LEFT 09/2009    s/p L THA  . ALLERGIC RHINITIS   . CHF (congestive heart failure)   . Carpal tunnel syndrome   . Hypertension   . HYPERLIPIDEMIA   . GERD   . Arthritis   . Osteoporosis   . CORONARY ARTERY DISEASE 2004    s/p CABG  . Atrial fibrillation     no anticoag due to fall risk/age  . DEPRESSION     Past Surgical History  Procedure Laterality Date  . (l) hip hemiarthroplasty  09/2009  . Appendectomy  1940  . Eye surgery      both cataracts  . Coronary artery bypass graft  2004  . Carpal tunnel release  2004    right and left-dsc  . Ulnar nerve transposition  2009    lt   . Vein ligation      legs  . Colonoscopy    . I&d extremity Right 12/26/2012    Procedure: IRRIGATION AND DEBRIDEMENT OF RIGHT LEG, SURGICAL PREP PLACEMENT OF A CELL AND VAC   ;  Surgeon: Wayland Denis, DO;  Location: [facility];  Service: Plastics;  Laterality: Right;    History   Social History  . Marital Status: Divorced    Spouse Name: N/A    Number of Children: N/A  . Years of Education: N/A   Occupational History  . Not on file.   Social History Main Topics  . Smoking status: Former Smoker    Quit date: 07/10/1978  . Smokeless tobacco: Never Used     Comment: Lives alone in indep living facility @ [place]. Active ADLS but does not drive dur to macular degen. supportive family members nearby  . Alcohol Use: No  . Drug Use: No  . Sexual Activity: No   Other Topics Concern  . Not on file   Social History Narrative   ACP - HCPOA Margarette Asal (c) [phone]; No CPR,  No mechanical ventilation.     ROS: no fevers or chills, productive cough, hemoptysis, dysphasia, odynophagia, melena, hematochezia, dysuria, hematuria, rash, seizure activity, orthopnea, PND, pedal edema, claudication. Remaining systems are negative.  Physical Exam: Well-developed well-nourished in no acute  distress.  Skin is warm and dry. Ecchymosis noted. HEENT is normal.  Neck is supple.  Chest is clear to auscultation with normal expansion.  Cardiovascular exam is  regular rate and rhythm. 2/6 systolic murmur Abdominal exam nontender or distended. No masses palpated. Extremities show trace to 1+ edema. neuro grossly intact  ECG sinus rhythm with occasional PVCs. No ST changes.

## 2013-06-24 ENCOUNTER — Other Ambulatory Visit (INDEPENDENT_AMBULATORY_CARE_PROVIDER_SITE_OTHER): Payer: Medicare Other

## 2013-06-24 DIAGNOSIS — I509 Heart failure, unspecified: Secondary | ICD-10-CM

## 2013-06-24 DIAGNOSIS — I4891 Unspecified atrial fibrillation: Secondary | ICD-10-CM

## 2013-06-24 DIAGNOSIS — I10 Essential (primary) hypertension: Secondary | ICD-10-CM

## 2013-06-24 DIAGNOSIS — I214 Non-ST elevation (NSTEMI) myocardial infarction: Secondary | ICD-10-CM

## 2013-06-24 DIAGNOSIS — E785 Hyperlipidemia, unspecified: Secondary | ICD-10-CM

## 2013-06-24 DIAGNOSIS — I5023 Acute on chronic systolic (congestive) heart failure: Secondary | ICD-10-CM

## 2013-06-24 LAB — BASIC METABOLIC PANEL
BUN: 21 mg/dL (ref 6–23)
Calcium: 8.7 mg/dL (ref 8.4–10.5)
Chloride: 102 mEq/L (ref 96–112)
Creatinine, Ser: 0.7 mg/dL (ref 0.4–1.2)
Glucose, Bld: 89 mg/dL (ref 70–99)

## 2013-06-30 ENCOUNTER — Other Ambulatory Visit: Payer: Self-pay | Admitting: Internal Medicine

## 2013-06-30 MED ORDER — VENLAFAXINE HCL ER 75 MG PO CP24: ORAL_CAPSULE | ORAL | Status: DC

## 2013-06-30 NOTE — Addendum Note (Signed)
Addended by: Deatra James on: 06/30/2013 01:46 PM   Modules accepted: Orders

## 2013-06-30 NOTE — Telephone Encounter (Signed)
Val pt 

## 2013-07-12 ENCOUNTER — Other Ambulatory Visit: Payer: Self-pay | Admitting: Internal Medicine

## 2013-07-14 MED ORDER — SIMVASTATIN 40 MG PO TABS: ORAL_TABLET | ORAL | Status: DC

## 2013-07-14 NOTE — Telephone Encounter (Signed)
VAL pt  

## 2013-07-14 NOTE — Telephone Encounter (Signed)
It looks like this is Dr Diamantina MonksLeschber's pt. Last OV with you was 03/2013 and you last filled on 04/06/13-Lipid panel has not been checked since 2013--please advise

## 2013-07-14 NOTE — Addendum Note (Signed)
Addended by: Deatra JamesBRAND, LUCY M on: 07/14/2013 10:27 AM   Modules accepted: Orders

## 2013-07-23 ENCOUNTER — Telehealth: Payer: Self-pay | Admitting: *Deleted

## 2013-07-23 MED ORDER — LIDOCAINE 5 % EX PTCH: 2.0000 | MEDICATED_PATCH | CUTANEOUS | Status: DC

## 2013-07-23 MED ORDER — FENTANYL 50 MCG/HR TD PT72: 50.0000 ug | MEDICATED_PATCH | TRANSDERMAL | Status: DC

## 2013-07-23 NOTE — Telephone Encounter (Signed)
Left msg on vm needing refills on her fentanyl patches & lidoderm patches...[name]/lmb

## 2013-07-23 NOTE — Telephone Encounter (Signed)
ok 

## 2013-07-23 NOTE — Telephone Encounter (Signed)
Notified pt rx ready for pick-up.../lmb 

## 2013-07-24 ENCOUNTER — Telehealth: Payer: Self-pay | Admitting: *Deleted

## 2013-07-24 NOTE — Telephone Encounter (Signed)
Received fax pt needing PA on her lidoderm patches. Notified insurance they have fax over PA form has been completed and fax back waiting on approval status...Julia Black/lmb

## 2013-07-25 NOTE — Telephone Encounter (Signed)
Pt left msg on vm wanting to know if lidoderm patches was sent to her pharmacy. Called pt back no answer LMOM lidoderm patch was sent, but now the insurance is requiring a prior authorization. PA was done yesterday haven't received status of approval yet...[name]/lmb

## 2013-07-28 ENCOUNTER — Other Ambulatory Visit: Payer: Self-pay | Admitting: Internal Medicine

## 2013-07-29 ENCOUNTER — Telehealth: Payer: Self-pay | Admitting: Internal Medicine

## 2013-07-29 NOTE — Telephone Encounter (Signed)
Pt called LMOM wanting to know starus of PA. Called pt inform her i had call [employer] was told PA is still in clinical review it takes 5-7 business days to process. Inform her will call her back once I get status,,,/lmb

## 2013-07-29 NOTE — Telephone Encounter (Signed)
Faxed script back to [facility]...lmb

## 2013-07-29 NOTE — Telephone Encounter (Signed)
See other msg still waiting on approval from insurance. Called to check status was told PA was still in clinical review take 5-7 business days to process...[name]/lmb

## 2013-07-29 NOTE — Telephone Encounter (Signed)
Pt wants to know what the insurance said about her pain patch.

## 2013-07-30 NOTE — Telephone Encounter (Signed)
Received PA back this morning med has been denied. It states we denied this request under medicare part D because lidocaine patch is not covered for treatment of back pain. Place PA on md desk to review & rx something else...[name]/lmb

## 2013-07-30 NOTE — Telephone Encounter (Signed)
Called pt to inform her that the insurance denied coverage on lidoderm patch. Inform her of the reason they denied it. Per md there isn't a generic to replace the lidoderm patch. Pt can pay fro it out of pocket, but not sure the cost.../lmb

## 2013-08-05 ENCOUNTER — Other Ambulatory Visit: Payer: Self-pay | Admitting: Internal Medicine

## 2013-08-14 ENCOUNTER — Other Ambulatory Visit (INDEPENDENT_AMBULATORY_CARE_PROVIDER_SITE_OTHER): Payer: Medicare Other

## 2013-08-14 ENCOUNTER — Encounter: Payer: Self-pay | Admitting: Internal Medicine

## 2013-08-14 ENCOUNTER — Telehealth: Payer: Self-pay | Admitting: *Deleted

## 2013-08-14 DIAGNOSIS — R300 Dysuria: Secondary | ICD-10-CM

## 2013-08-14 DIAGNOSIS — R350 Frequency of micturition: Secondary | ICD-10-CM

## 2013-08-14 LAB — URINALYSIS, ROUTINE W REFLEX MICROSCOPIC
Bilirubin Urine: NEGATIVE
Ketones, ur: NEGATIVE
Leukocytes, UA: NEGATIVE
NITRITE: NEGATIVE
Specific Gravity, Urine: 1.015 (ref 1.000–1.030)
TOTAL PROTEIN, URINE-UPE24: NEGATIVE
UROBILINOGEN UA: 0.2 (ref 0.0–1.0)
Urine Glucose: NEGATIVE
pH: 5.5 (ref 5.0–8.0)

## 2013-08-14 MED ORDER — CIPROFLOXACIN HCL 250 MG PO TABS: 250.0000 mg | ORAL_TABLET | Freq: Two times a day (BID) | ORAL | Status: AC

## 2013-08-14 NOTE — Telephone Encounter (Signed)
Please call to verify receipt of my chart message: These results look fine. No clear UTI here However, given your symptoms, I will send antibiotics to your pharmacy (cipro bid x 5d) Please call if symptoms unimproved in next 7 days, sooner if worse No other treatment changes recommended.

## 2013-08-14 NOTE — Telephone Encounter (Signed)
Daughter call back inform her ok to bring in urine make sure its a clean catch, and if she needs a antibiotic md would rx then...[name]/lmb

## 2013-08-14 NOTE — Telephone Encounter (Signed)
Daughter call left smg on vm stating mom thinks she has another UTI. She said she been up all night going to bathroom & when she urinate she has burning/stinging. Wanting to come to the lab for UA...[name]/lmb

## 2013-08-15 NOTE — Telephone Encounter (Signed)
Called pt daughter no answer LMOM (cell) with md response. Also call pt gave her md response as well...Julia Black/lmb

## 2013-08-18 ENCOUNTER — Other Ambulatory Visit (HOSPITAL_COMMUNITY): Payer: Self-pay | Admitting: Nurse Practitioner

## 2013-08-28 ENCOUNTER — Ambulatory Visit: Payer: Medicare Other | Admitting: Physician Assistant

## 2013-08-29 ENCOUNTER — Other Ambulatory Visit (HOSPITAL_COMMUNITY): Payer: Self-pay | Admitting: Nurse Practitioner

## 2013-08-29 ENCOUNTER — Other Ambulatory Visit: Payer: Self-pay | Admitting: Internal Medicine

## 2013-09-03 ENCOUNTER — Ambulatory Visit (INDEPENDENT_AMBULATORY_CARE_PROVIDER_SITE_OTHER): Payer: Medicare Other | Admitting: Physician Assistant

## 2013-09-03 ENCOUNTER — Ambulatory Visit
Admission: RE | Admit: 2013-09-03 | Discharge: 2013-09-03 | Disposition: A | Discharge status: Home / Self Care | Payer: Medicare Other | Source: Ambulatory Visit | Attending: Physician Assistant | Admitting: Physician Assistant

## 2013-09-03 ENCOUNTER — Encounter: Payer: Self-pay | Admitting: Physician Assistant

## 2013-09-03 ENCOUNTER — Telehealth: Payer: Self-pay | Admitting: *Deleted

## 2013-09-03 VITALS — BP 100/60 | HR 59 | Ht 61.0 in | Wt 114.0 lb

## 2013-09-03 DIAGNOSIS — I2510 Atherosclerotic heart disease of native coronary artery without angina pectoris: Secondary | ICD-10-CM

## 2013-09-03 DIAGNOSIS — R0602 Shortness of breath: Secondary | ICD-10-CM

## 2013-09-03 DIAGNOSIS — R059 Cough, unspecified: Secondary | ICD-10-CM

## 2013-09-03 DIAGNOSIS — E785 Hyperlipidemia, unspecified: Secondary | ICD-10-CM

## 2013-09-03 DIAGNOSIS — R05 Cough: Secondary | ICD-10-CM

## 2013-09-03 DIAGNOSIS — I10 Essential (primary) hypertension: Secondary | ICD-10-CM

## 2013-09-03 DIAGNOSIS — I5032 Chronic diastolic (congestive) heart failure: Secondary | ICD-10-CM

## 2013-09-03 DIAGNOSIS — I4891 Unspecified atrial fibrillation: Secondary | ICD-10-CM

## 2013-09-03 LAB — CBC WITH DIFFERENTIAL/PLATELET
Basophils Absolute: 0 10*3/uL (ref 0.0–0.1)
Basophils Relative: 0.8 % (ref 0.0–3.0)
EOS ABS: 0 10*3/uL (ref 0.0–0.7)
Eosinophils Relative: 0.9 % (ref 0.0–5.0)
HCT: 28 % — ABNORMAL LOW (ref 36.0–46.0)
Hemoglobin: 8.3 g/dL — ABNORMAL LOW (ref 12.0–15.0)
LYMPHS PCT: 14.9 % (ref 12.0–46.0)
Lymphs Abs: 0.7 10*3/uL (ref 0.7–4.0)
MCV: 71.5 fl — AB (ref 78.0–100.0)
MONO ABS: 0.4 10*3/uL (ref 0.1–1.0)
Monocytes Relative: 8 % (ref 3.0–12.0)
NEUTROS PCT: 75.4 % (ref 43.0–77.0)
Neutro Abs: 3.5 10*3/uL (ref 1.4–7.7)
Platelets: 178 10*3/uL (ref 150.0–400.0)
RBC: 3.92 Mil/uL (ref 3.87–5.11)
RDW: 19.6 % — ABNORMAL HIGH (ref 11.5–14.6)
WBC: 4.6 10*3/uL (ref 4.5–10.5)

## 2013-09-03 LAB — BASIC METABOLIC PANEL
BUN: 25 mg/dL — ABNORMAL HIGH (ref 6–23)
CALCIUM: 9.1 mg/dL (ref 8.4–10.5)
CO2: 30 meq/L (ref 19–32)
CREATININE: 0.8 mg/dL (ref 0.4–1.2)
Chloride: 108 mEq/L (ref 96–112)
GFR: 72.18 mL/min (ref >60.00)
GLUCOSE: 78 mg/dL (ref 70–99)
Potassium: 4.7 mEq/L (ref 3.5–5.1)
Sodium: 144 mEq/L (ref 135–145)

## 2013-09-03 LAB — BRAIN NATRIURETIC PEPTIDE: PRO B NATRI PEPTIDE: 929 pg/mL — AB (ref 0.0–100.0)

## 2013-09-03 MED ORDER — FUROSEMIDE 20 MG PO TABS: ORAL_TABLET | ORAL | Status: DC

## 2013-09-03 MED ORDER — CLOPIDOGREL BISULFATE 75 MG PO TABS: 75.0000 mg | ORAL_TABLET | ORAL | Status: DC

## 2013-09-03 NOTE — Patient Instructions (Addendum)
STAY ON THE SAME DOSE OF LASIX YOU HAVE BEEN TAKING CURRENTLY OF 20 MG DAILY;, NEW REFILL SENT IN WITH THE NEW DIRECTIONS  LAB WORK TODAY; BMET, CBC W/DIFF, BNP  YOU NEED A CHEST X-RAY TODAY; WHICH TO BE DONE AT [facility] ON [address]  Your physician recommends that you schedule a follow-up appointment in: 09/26/13 @ 11:10 WITH SCOTT WEAVER, PAC SAME DAY DR. Jens SomRENSHAW IS IN THE OFFICE

## 2013-09-03 NOTE — Telephone Encounter (Signed)
s/w pt's daughter Darl PikesSusan about lab results and med change and to f/u w/PCP due to Hgb 8.7. Darl PikesSusan verbalized understanding to all instructions and Plan of Care. Increase lasix 40 mg qd x3 days, resume 20 mg after 3 days, hold plavix.

## 2013-09-03 NOTE — Progress Notes (Signed)
[address] [place], [place]  [address] Phone: [phone] Fax:  [phone]  Date:  09/03/2013   ID:  Julia Black, DOB 06/01/1919, MRN [id number]  PCP:  Rene PaciValerie Leschber, MD  Cardiologist:  Dr. Olga MillersBrian Crenshaw     History of Present Illness: Julia Black is a 80 y.o. female with a hx of CAD, status post CABG in 2004, CHF, carotid stenosis, HTN, HL, atrial fibrillation. She was admitted in 02/2013 with elevated troponin in the setting of congestive heart failure after an epidural steroid injection. EF was 25-30% with anteroseptal and apical akinesis on echocardiogram. This was felt to represent stress-induced cardiomyopathy. Conservative measures were felt to be appropriate if possible given advanced age. She improved with medical therapy. Myoview 02/2013 was low risk without ischemia. She did have evidence of anterior scar. EF was 49%. Follow up echo in 03/2013 demonstrated normal LV function with moderate diastolic dysfunction, mild to moderate MR with bileaflet mitral valve prolapse and biatrial enlargement. She is not anticoagulated for atrial fibrillation due to history of fall risk. Last seen by Dr. Jens Somrenshaw 06/2013.  LHC (01/2003):  3 v CAD => s/p CABG. Carotid US (05/2010):  RICA 70-99%; LICA 0-49%. Renal Artery US (09/2010):  No RAS.   Echo (02/2013):  EF 25-30%.   Myoview (02/2013):  Anterior scar; no ischemia; EF 49%.   Echo (04/04/13):  Mild focal basal septal hypertrophy, EF 60-65%, no RWMA, Gr 2 DD, mild to mod MAC, mild bileaflet MVP, mild to mod MR, mild to mod LAE, mild RAE, PASP 48.  She presents today with complaints of increased dyspnea. She feels as though her dyspnea worsens when she has more symptoms related to her spinal stenosis. This occurred over the last several weeks. She also has significant knee arthritis limiting her mobility. She sleeps on 2 pillows chronically. She denies PND. She has noted some increased LE edema. She typically takes Lasix 3 times a week.  She increased this to daily. She has noted some slight improvement in her dyspnea. She feels as though she has some chest discomfort related to her back pain. This is fairly chronic without change. She denies associated radiation, diaphoresis or nausea. She denies syncope.   Recent Labs: 02/07/2013: Pro B Natriuretic peptide (BNP) 1264.0*  02/08/2013: TSH 1.480  02/11/2013: Hemoglobin 11.7*  06/24/2013: Creatinine 0.7; Potassium 3.9   Wt Readings from Last 3 Encounters:  09/03/13 114 lb (51.71 kg)  06/19/13 118 lb (53.524 kg)  05/27/13 115 lb 6.4 oz (52.345 kg)     Past Medical History  Diagnosis Date  . SPINAL STENOSIS   . LOW BACK PAIN     chronic, follows with Nsurg for ESI  . FRACTURE, PELVIS, RIGHT 12/2009  . HIP FRACTURE, LEFT 09/2009    s/p L THA  . ALLERGIC RHINITIS   . CHF (congestive heart failure)   . Carpal tunnel syndrome   . Hypertension   . HYPERLIPIDEMIA   . GERD   . Arthritis   . Osteoporosis   . CORONARY ARTERY DISEASE 2004    s/p CABG  . Atrial fibrillation     no anticoag due to fall risk/age  . DEPRESSION     Current Outpatient Prescriptions  Medication Sig Dispense Refill  . ALPRAZolam (XANAX) 0.25 MG tablet TAKE ONE TABLET AT BEDTIME AS NEEDED FORSLEEP OR ANXIETY  30 tablet  3  . Ascorbic Acid (VITAMIN C) 500 MG tablet Take 1,000 mg by mouth daily.       .[name]  aspirin 81 MG tablet Take 81 mg by mouth every morning.       . Cholecalciferol (VITAMIN D3) 1000 UNITS CAPS Take by mouth daily.        . clopidogrel (PLAVIX) 75 MG tablet TAKE ONE TABLET DAILY WITH BREAKFAST  30 tablet  6  . DOK 100 MG capsule TAKE ONE CAPSULE TWICE A DAY  60 capsule  11  . fentaNYL (DURAGESIC) 50 MCG/HR Place 1 patch (50 mcg total) onto the skin every 3 (three) days.  10 patch  0  . furosemide (LASIX) 20 MG tablet TAKE ONE TABLET EVERY Monday, Wednesday AND FRIDAY  30 tablet  12  . HYDROcodone-acetaminophen (NORCO/VICODIN) 5-325 MG per tablet Take 1 tablet by mouth every 6 (six)  hours as needed for pain.  120 tablet  5  . levofloxacin (LEVAQUIN) 250 MG tablet Take 1 tablet (250 mg total) by mouth daily.  10 tablet  0  . lidocaine (LIDODERM) 5 % Place 2 patches onto the skin daily. Remove & Discard patch within 12 hours or as directed by MD  60 patch  5  . metoprolol tartrate (LOPRESSOR) 25 MG tablet TAKE ONE-HALF TABLET TWICE DAILY  30 tablet  6  . Multiple Vitamin (MULTIVITAMIN WITH MINERALS) TABS Take 1 tablet by mouth daily.      . nitroGLYCERIN (NITROSTAT) 0.4 MG SL tablet Place 1 tablet (0.4 mg total) under the tongue every 5 (five) minutes as needed for chest pain (upt o 3 doses).  25 tablet  4  . Omega-3 Fatty Acids (FISH OIL) 1000 MG CAPS Take by mouth daily.       . pantoprazole (PROTONIX) 40 MG tablet TAKE ONE TABLET BY MOUTH ONCE DAILY  30 tablet  2  . polyethylene glycol (MIRALAX / GLYCOLAX) packet Take 17 g by mouth every morning.      . potassium chloride SA (K-DUR,KLOR-CON) 20 MEQ tablet TAKE ONE TABLET DAILY AS NEEDED  30 tablet  1  . pregabalin (LYRICA) 50 MG capsule Take 1 capsule (50 mg total) by mouth 3 (three) times daily.  90 capsule  3  . Probiotic Product (PROBIOTIC DAILY PO) Take 1 each by mouth daily as needed (for GI tract).      . simvastatin (ZOCOR) 40 MG tablet TAKE ONE TABLET AT BEDTIME  30 tablet  5  . traMADol (ULTRAM) 50 MG tablet Take 50 mg by mouth every 6 (six) hours as needed for pain.      . traMADol (ULTRAM) 50 MG tablet TAKE 1/2 TO 1 TABLET BY MOUTH EVERY 8 HOURS AS NEEDED FOR PAIN  90 tablet  1  . venlafaxine XR (EFFEXOR-XR) 75 MG 24 hr capsule TAKE ONE CAPSULE EACH DAY  30 capsule  5  . zolpidem (AMBIEN) 5 MG tablet TAKE ONE TABLET AT BEDTIME AS NEEDED  30 tablet  3   No current facility-administered medications for this visit.    Allergies:   Amoxicillin; Sporanox; and Sulfa antibiotics   Social History:  The patient  reports that she quit smoking about 35 years ago. She has never used smokeless tobacco. She reports that she  does not drink alcohol or use illicit drugs.   Family History:  The patient's family history includes Arthritis in her father, maternal grandfather, maternal grandmother, mother, paternal grandfather, and paternal grandmother; Breast cancer in her daughter and mother; Heart disease in her father and mother; Hypertension in her father and mother.   ROS:  Please see the history of  present illness.   She has a nonproductive cough. She denies fever.   All other systems reviewed and negative.   PHYSICAL EXAM: VS:  BP 100/60  Pulse 59  Ht 5\' 1"  (1.549 m)  Wt 114 lb (51.71 kg)  BMI 21.55 kg/m2 Well nourished, well developed, in no acute distress HEENT: normal Neck: I cannot assess JVDat 90 Cardiac:  normal S1, S2; RRR; 1-2/6 systolic murmuralong the LSB Lungs:  Decreased breath sounds bilaterally, ? Crackles at the bases Abd: soft, nontender, no hepatomegaly Ext: trace bilateral ankle edema Skin: warm and dry Neuro:  CNs 2-12 intact, no focal abnormalities noted  EKG:  Sinus bradycardia, HR 59, normal axis, nonspecific ST-T wave changes     ASSESSMENT AND PLAN:  1. Dyspnea: I suspect this is likely multifactorial and related to deconditioning from back pain from spinal stenosis as well as CHF. Recent echocardiogram demonstrated normal LV function. Recent Myoview was also low risk for ischemia.  She has had a recent cough. She also has an abnormal lung exam. She has felt somewhat better with increased dose of Lasix. I will obtain a basic metabolic panel, BNP, CBC and chest x-ray today. Continue current dose of Lasix. 2. CAD:  Continue aspirin, Plavix, beta blocker, statin. Suspect dyspnea related to deconditioning and CHF. Recent Myoview low risk. 3. Chronic Diastolic CHF:  As noted, chest x-ray, basic metabolic panel and BNP will be obtained. Adjust Lasix further if needed. 4. Atrial Fibrillation:  Maintaining NSR.  She is not on anticoagulation due to fall risk. 5. Carotid stenosis: I will  review with Dr. Jens Som regarding further testing. 6. Hypertension:  Controlled. 7. Hyperlipidemia:  Continue statin. 8. Disposition: Follow up with Dr. Jens Som on me in 3-4 weeks.  Signed, Tereso Newcomer, PA-C  09/03/2013 9:36 AM

## 2013-09-05 ENCOUNTER — Ambulatory Visit (INDEPENDENT_AMBULATORY_CARE_PROVIDER_SITE_OTHER): Payer: Medicare Other | Admitting: Internal Medicine

## 2013-09-05 ENCOUNTER — Encounter: Payer: Self-pay | Admitting: Internal Medicine

## 2013-09-05 ENCOUNTER — Ambulatory Visit (INDEPENDENT_AMBULATORY_CARE_PROVIDER_SITE_OTHER): Payer: Medicare Other

## 2013-09-05 VITALS — BP 112/60 | HR 96 | Temp 97.3°F | Wt 110.4 lb

## 2013-09-05 DIAGNOSIS — I4891 Unspecified atrial fibrillation: Secondary | ICD-10-CM

## 2013-09-05 DIAGNOSIS — I2510 Atherosclerotic heart disease of native coronary artery without angina pectoris: Secondary | ICD-10-CM

## 2013-09-05 DIAGNOSIS — E785 Hyperlipidemia, unspecified: Secondary | ICD-10-CM

## 2013-09-05 DIAGNOSIS — D62 Acute posthemorrhagic anemia: Secondary | ICD-10-CM

## 2013-09-05 LAB — CBC WITH DIFFERENTIAL/PLATELET
BASOS PCT: 0.2 % (ref 0.0–3.0)
Basophils Absolute: 0 10*3/uL (ref 0.0–0.1)
EOS PCT: 1.9 % (ref 0.0–5.0)
Eosinophils Absolute: 0.1 10*3/uL (ref 0.0–0.7)
HEMATOCRIT: 27.9 % — AB (ref 36.0–46.0)
LYMPHS ABS: 1.2 10*3/uL (ref 0.7–4.0)
Lymphocytes Relative: 18.1 % (ref 12.0–46.0)
MCHC: 30 g/dL (ref 30.0–36.0)
MCV: 70.4 fl — ABNORMAL LOW (ref 78.0–100.0)
Monocytes Absolute: 0.7 10*3/uL (ref 0.1–1.0)
Monocytes Relative: 10.4 % (ref 3.0–12.0)
NEUTROS ABS: 4.5 10*3/uL (ref 1.4–7.7)
Neutrophils Relative %: 69.4 % (ref 43.0–77.0)
Platelets: 198 10*3/uL (ref 150.0–400.0)
RBC: 3.97 Mil/uL (ref 3.87–5.11)
RDW: 19.3 % — ABNORMAL HIGH (ref 11.5–14.6)
WBC: 6.5 10*3/uL (ref 4.5–10.5)

## 2013-09-05 LAB — LIPID PANEL
Cholesterol: 102 mg/dL (ref 0–200)
HDL: 43.1 mg/dL (ref >39.00)
LDL Cholesterol: 35 mg/dL (ref 0–99)
Total CHOL/HDL Ratio: 2
Triglycerides: 119 mg/dL (ref 0.0–149.0)
VLDL: 23.8 mg/dL (ref 0.0–40.0)

## 2013-09-05 NOTE — Assessment & Plan Note (Signed)
No anticoagulation due to age and fall risk/hx follows with cards for same -  rate controlled and euvolemic Holding Plavix due to progressive anemia since 08/2013

## 2013-09-05 NOTE — Assessment & Plan Note (Signed)
On statin - last lipids at goal - check annually The current medical regimen is effective;  continue present plan and medications.

## 2013-09-05 NOTE — Progress Notes (Signed)
Subjective:    Patient ID: Julia Black, female    DOB: 12/29/1918, 80 y.o.   MRN: [id number]  HPI Here for followup, reviewed interval medical history as well as chronic issues  Progressive ABL anemia on recent labs with cardiology  Past Medical History  Diagnosis Date  . SPINAL STENOSIS   . LOW BACK PAIN     chronic, follows with Nsurg for ESI  . FRACTURE, PELVIS, RIGHT 12/2009  . HIP FRACTURE, LEFT 09/2009    s/p L THA  . ALLERGIC RHINITIS   . CHF (congestive heart failure)   . Carpal tunnel syndrome   . Hypertension   . HYPERLIPIDEMIA   . GERD   . Arthritis   . Osteoporosis   . CORONARY ARTERY DISEASE 2004    s/p CABG  . Atrial fibrillation     no anticoag due to fall risk/age  . DEPRESSION      Review of Systems  Constitutional: Positive for fatigue. Negative for fever and unexpected weight change.  HENT: Positive for trouble swallowing (intermittent x 2 weeks - solids and liquids).   Respiratory: Positive for shortness of breath. Negative for cough.   Cardiovascular: Negative for chest pain, palpitations and leg swelling.  Gastrointestinal: Negative for nausea, vomiting, abdominal pain, diarrhea, constipation, blood in stool, abdominal distention, anal bleeding and rectal pain.  Genitourinary: Negative for hematuria, menstrual problem and pelvic pain.  Neurological: Negative for speech difficulty and weakness.       Objective:   Physical Exam BP 112/60  Pulse 96  Temp(Src) 97.3 F (36.3 C) (Oral)  Wt 110 lb 6.4 oz (50.077 kg) Wt Readings from Last 3 Encounters:  09/05/13 110 lb 6.4 oz (50.077 kg)  09/03/13 114 lb (51.71 kg)  06/19/13 118 lb (53.524 kg)   Constitutional: She appears well-developed and well-nourished. No distress. Son at side HENT: Head: Normocephalic and atraumatic. Ears: B TMs ok, no erythema or effusion; Nose: Nose normal. Mouth/Throat: Oropharynx is clear and moist. No oropharyngeal exudate.  Eyes: Conjunctivae and EOM are normal. Pupils  are equal, round, and reactive to light. No scleral icterus.  Neck: Normal range of motion. Neck supple. No JVD present. No thyromegaly present.  Cardiovascular: Normal rate, regular rhythm and normal heart sounds.  3/6 syst murmur heard. No BLE edema. Pulmonary/Chest: Effort normal and breath sounds normal. No respiratory distress. She has no wheezes.  Abdominal: Soft. Bowel sounds are normal. She exhibits no distension. There is no tenderness. no masses Neurological: She is alert and oriented to person, place, and time. No cranial nerve deficit. Coordination, balance, strength, speech and gait are normal.  Skin: Skin is warm and dry. No rash noted. No erythema.  Psychiatric: She has a normal mood and affect. Her behavior is normal. Judgment and thought content normal.    Lab Results  Component Value Date   WBC 4.6 09/03/2013   HGB 8.3 Repeated and verified X2.* 09/03/2013   HCT 28.0* 09/03/2013   PLT 178.0 09/03/2013   GLUCOSE 78 09/03/2013   CHOL 165 03/26/2012   TRIG 121.0 03/26/2012   HDL 56.50 03/26/2012   LDLCALC 84 03/26/2012   ALT 27 03/26/2012   AST 29 03/26/2012   NA 144 09/03/2013   K 4.7 09/03/2013   CL 108 09/03/2013   CREATININE 0.8 09/03/2013   BUN 25* 09/03/2013   CO2 30 09/03/2013   TSH 1.480 02/08/2013   INR 0.98 02/08/2013   HGBA1C  Value: 5.4 (NOTE)  According to the ADA Clinical Practice Recommendations for 2011, when HbA1c is used as a screening test:   >=6.5%   Diagnostic of Diabetes Mellitus           (if abnormal result  is confirmed)  5.7-6.4%   Increased risk of developing Diabetes Mellitus  References:Diagnosis and Classification of Diabetes Mellitus,Diabetes Care,2011,34(Suppl 1):S62-S69 and Standards of Medical Care in         Diabetes - 2011,Diabetes Care,2011,34  (Suppl 1):S11-S61. 08/11/2010        Assessment & Plan:   Anemia - ?ABL -  no GI loss hx evident on review - recheck CBC with ferritin  now Home stool cards x 3 Continue to hold Plavix and verify compliance at home with PPI  Discussed potential risk of CVA/TIA off Plavix with importance of evaluating potential bleed - pt/son understand and agree - will continue ASA 81 as no acute GIB apparent  Problem List Items Addressed This Visit   Atrial fibrillation     No anticoagulation due to age and fall risk/hx follows with cards for same -  rate controlled and euvolemic Holding Plavix due to progressive anemia since 08/2013    Relevant Orders      Urinalysis   HYPERLIPIDEMIA     On statin - last lipids at goal - check annually The current medical regimen is effective;  continue present plan and medications.      Relevant Orders      Lipid panel      Urinalysis    Other Visit Diagnoses   Acute posthemorrhagic anemia    -  Primary    Relevant Orders       CBC with Differential       Ferritin       Hemoccult Cards (X3 cards)       Urinalysis

## 2013-09-05 NOTE — Patient Instructions (Signed)
It was good to see you today.  We have reviewed your prior records including labs and tests today  Test(s) ordered today. Your results will be released to MyChart (or called to you) after review, usually within 72hours after test completion. If any changes need to be made, you will be notified at that same time.  Medications reviewed and updated, no changes recommended at this time.  Please schedule followup in 3-4 weeks to continue review, call sooner if problems.

## 2013-09-08 ENCOUNTER — Telehealth: Payer: Self-pay | Admitting: *Deleted

## 2013-09-08 ENCOUNTER — Telehealth: Payer: Self-pay | Admitting: Internal Medicine

## 2013-09-08 DIAGNOSIS — N039 Chronic nephritic syndrome with unspecified morphologic changes: Secondary | ICD-10-CM

## 2013-09-08 DIAGNOSIS — D631 Anemia in chronic kidney disease: Secondary | ICD-10-CM

## 2013-09-08 DIAGNOSIS — D649 Anemia, unspecified: Secondary | ICD-10-CM

## 2013-09-08 DIAGNOSIS — D62 Acute posthemorrhagic anemia: Secondary | ICD-10-CM

## 2013-09-08 LAB — URINALYSIS, ROUTINE W REFLEX MICROSCOPIC
BILIRUBIN URINE: NEGATIVE
HGB URINE DIPSTICK: NEGATIVE
Ketones, ur: NEGATIVE
NITRITE: NEGATIVE
RBC / HPF: NONE SEEN (ref 0–?)
Specific Gravity, Urine: 1.015 (ref 1.000–1.030)
Total Protein, Urine: NEGATIVE
Urine Glucose: NEGATIVE
Urobilinogen, UA: 0.2 (ref 0.0–1.0)
pH: 5 (ref 5.0–8.0)

## 2013-09-08 LAB — FERRITIN: Ferritin: 19.4 ng/mL (ref 10.0–291.0)

## 2013-09-08 NOTE — Telephone Encounter (Signed)
Called lab to verify orders. Pt couldn't give urine sample on Friday that is the hold up. Will cx urine order so md can see other results. pls advise since md is out of office...[name]/lmb

## 2013-09-08 NOTE — Telephone Encounter (Signed)
Message copied by Deatra JamesBRAND, Deshanta Lady M on Mon Sep 08, 2013  1:27 PM ------      Message from: Pecola LawlessHOPPER, WILLIAM F      Created: Mon Sep 08, 2013  1:18 PM       Stool cards are medically necessary because of an acute blood loss problem.his is NOT screening which is allowed only once a year            Please complete stool cards and recheck the CBC in 3-5 days. At this time the anemia appears to be stable; we need to rule out any progression.            Stop the cholesterol medicine for the time being             ------

## 2013-09-08 NOTE — Telephone Encounter (Signed)
Message copied by Deatra JamesBRAND, Savyon Loken M on Mon Sep 08, 2013  1:32 PM ------      Message from: Pecola LawlessHOPPER, WILLIAM F      Created: Mon Sep 08, 2013  1:18 PM       Stool cards are medically necessary because of an acute blood loss problem.his is NOT screening which is allowed only once a year            Please complete stool cards and recheck the CBC in 3-5 days. At this time the anemia appears to be stable; we need to rule out any progression.            Stop the cholesterol medicine for the time being             ------

## 2013-09-08 NOTE — Telephone Encounter (Signed)
See previous msg pt daughter called concerning lab result & Dr. Alwyn RenHopper responded. Closing this encounter...[name]/lmb

## 2013-09-08 NOTE — Telephone Encounter (Signed)
Left msg on vm stating she miss and release the hemoccult card order. Need to be re-entered.Julia Black. Tried to re-entered  But it states can not sign insurance will only allow 1 hemoccult per 12 months./lmb

## 2013-09-08 NOTE — Telephone Encounter (Signed)
Notified pt daughter (susan) with md response. Entered order for cbc...[name]/lmb

## 2013-09-08 NOTE — Telephone Encounter (Signed)
Darl PikesSusan states that mother is requesting test results from Friday.  They are still pending.  Darl PikesSusan wants to make sure that they will get notified as soon as possible.

## 2013-09-09 ENCOUNTER — Other Ambulatory Visit (INDEPENDENT_AMBULATORY_CARE_PROVIDER_SITE_OTHER): Payer: Medicare Other

## 2013-09-09 ENCOUNTER — Encounter: Payer: Self-pay | Admitting: Internal Medicine

## 2013-09-09 ENCOUNTER — Other Ambulatory Visit: Payer: Self-pay | Admitting: Internal Medicine

## 2013-09-09 DIAGNOSIS — N039 Chronic nephritic syndrome with unspecified morphologic changes: Secondary | ICD-10-CM

## 2013-09-09 DIAGNOSIS — D509 Iron deficiency anemia, unspecified: Secondary | ICD-10-CM

## 2013-09-09 DIAGNOSIS — D62 Acute posthemorrhagic anemia: Secondary | ICD-10-CM

## 2013-09-09 DIAGNOSIS — D631 Anemia in chronic kidney disease: Secondary | ICD-10-CM

## 2013-09-09 LAB — CBC WITH DIFFERENTIAL/PLATELET
BASOS ABS: 0.1 10*3/uL (ref 0.0–0.1)
Basophils Relative: 0.7 % (ref 0.0–3.0)
EOS ABS: 0.1 10*3/uL (ref 0.0–0.7)
Eosinophils Relative: 1.2 % (ref 0.0–5.0)
HEMATOCRIT: 30.1 % — AB (ref 36.0–46.0)
Hemoglobin: 8.8 g/dL — ABNORMAL LOW (ref 12.0–15.0)
LYMPHS ABS: 1 10*3/uL (ref 0.7–4.0)
Lymphocytes Relative: 12.2 % (ref 12.0–46.0)
MCHC: 29.1 g/dL — ABNORMAL LOW (ref 30.0–36.0)
MCV: 72 fl — ABNORMAL LOW (ref 78.0–100.0)
Monocytes Absolute: 0.7 10*3/uL (ref 0.1–1.0)
Monocytes Relative: 8.9 % (ref 3.0–12.0)
Neutro Abs: 6.4 10*3/uL (ref 1.4–7.7)
Neutrophils Relative %: 77 % (ref 43.0–77.0)
PLATELETS: 189 10*3/uL (ref 150.0–400.0)
RBC: 4.18 Mil/uL (ref 3.87–5.11)
RDW: 19.9 % — AB (ref 11.5–14.6)
WBC: 8.3 10*3/uL (ref 4.5–10.5)

## 2013-09-09 LAB — HEMOCCULT SLIDES (X 3 CARDS)
Fecal Occult Blood: NEGATIVE
OCCULT 1: NEGATIVE
OCCULT 2: NEGATIVE
OCCULT 3: NEGATIVE
OCCULT 4: NEGATIVE
OCCULT 5: NEGATIVE

## 2013-09-09 MED ORDER — CIPROFLOXACIN HCL 250 MG PO TABS: 250.0000 mg | ORAL_TABLET | Freq: Two times a day (BID) | ORAL | Status: DC

## 2013-09-10 ENCOUNTER — Other Ambulatory Visit: Payer: Self-pay | Admitting: Internal Medicine

## 2013-09-11 ENCOUNTER — Telehealth: Payer: Self-pay | Admitting: *Deleted

## 2013-09-11 ENCOUNTER — Encounter: Payer: Self-pay | Admitting: Internal Medicine

## 2013-09-11 ENCOUNTER — Other Ambulatory Visit: Payer: Self-pay | Admitting: Internal Medicine

## 2013-09-11 MED ORDER — HYDROCODONE-ACETAMINOPHEN 5-325 MG PO TABS: 1.0000 | ORAL_TABLET | Freq: Four times a day (QID) | ORAL | Status: DC | PRN

## 2013-09-11 NOTE — Telephone Encounter (Signed)
Done hardcopy to robin  

## 2013-09-11 NOTE — Telephone Encounter (Signed)
MD outn of office. Pls advise on refill...[name]/lmb

## 2013-09-11 NOTE — Telephone Encounter (Signed)
Requesting refill on mom pain med hydrocodone. MD is out of office. pls advise on refll....Julia Black/lmb

## 2013-09-11 NOTE — Telephone Encounter (Signed)
Notified daughter Darl Pikes(susan) inform her rx ready for pick-up....[name]/lmb

## 2013-09-12 NOTE — Telephone Encounter (Signed)
Ok to refill 

## 2013-09-12 NOTE — Telephone Encounter (Signed)
[facility]ed script [facility] to [facility].../lmb 

## 2013-09-16 ENCOUNTER — Other Ambulatory Visit (INDEPENDENT_AMBULATORY_CARE_PROVIDER_SITE_OTHER): Payer: Medicare Other

## 2013-09-16 DIAGNOSIS — D509 Iron deficiency anemia, unspecified: Secondary | ICD-10-CM

## 2013-09-16 LAB — CBC WITH DIFFERENTIAL/PLATELET
Basophils Absolute: 0 10*3/uL (ref 0.0–0.1)
Basophils Relative: 0.7 % (ref 0.0–3.0)
EOS ABS: 0.1 10*3/uL (ref 0.0–0.7)
Eosinophils Relative: 1.7 % (ref 0.0–5.0)
HEMATOCRIT: 33.3 % — AB (ref 36.0–46.0)
Hemoglobin: 10 g/dL — ABNORMAL LOW (ref 12.0–15.0)
LYMPHS ABS: 1 10*3/uL (ref 0.7–4.0)
Lymphocytes Relative: 14.2 % (ref 12.0–46.0)
MCHC: 29.9 g/dL — ABNORMAL LOW (ref 30.0–36.0)
MCV: 76.7 fl — AB (ref 78.0–100.0)
MONO ABS: 0.6 10*3/uL (ref 0.1–1.0)
Monocytes Relative: 8.7 % (ref 3.0–12.0)
NEUTROS PCT: 74.7 % (ref 43.0–77.0)
Neutro Abs: 5 10*3/uL (ref 1.4–7.7)
Platelets: 191 10*3/uL (ref 150.0–400.0)
RBC: 4.34 Mil/uL (ref 3.87–5.11)
RDW: 28.6 % — ABNORMAL HIGH (ref 11.5–14.6)
WBC: 6.7 10*3/uL (ref 4.5–10.5)

## 2013-09-22 ENCOUNTER — Telehealth: Payer: Self-pay | Admitting: *Deleted

## 2013-09-22 MED ORDER — FENTANYL 50 MCG/HR TD PT72: 50.0000 ug | MEDICATED_PATCH | TRANSDERMAL | Status: DC

## 2013-09-22 NOTE — Telephone Encounter (Signed)
Notified pt rx ready for pick-up.../lmb 

## 2013-09-22 NOTE — Telephone Encounter (Signed)
#  5 ; 1 every 3 days

## 2013-09-22 NOTE — Telephone Encounter (Signed)
Left msg on vm need refills on her fentanyl patches. MD out of the office this week. Pls advise on refill...[name]/lmb

## 2013-09-26 ENCOUNTER — Encounter: Payer: Self-pay | Admitting: Physician Assistant

## 2013-09-26 ENCOUNTER — Ambulatory Visit (INDEPENDENT_AMBULATORY_CARE_PROVIDER_SITE_OTHER): Payer: Medicare Other | Admitting: Physician Assistant

## 2013-09-26 VITALS — BP 90/58 | HR 61 | Ht 61.0 in | Wt 112.0 lb

## 2013-09-26 DIAGNOSIS — I4891 Unspecified atrial fibrillation: Secondary | ICD-10-CM

## 2013-09-26 DIAGNOSIS — I2510 Atherosclerotic heart disease of native coronary artery without angina pectoris: Secondary | ICD-10-CM

## 2013-09-26 DIAGNOSIS — E785 Hyperlipidemia, unspecified: Secondary | ICD-10-CM

## 2013-09-26 DIAGNOSIS — I10 Essential (primary) hypertension: Secondary | ICD-10-CM

## 2013-09-26 DIAGNOSIS — I5032 Chronic diastolic (congestive) heart failure: Secondary | ICD-10-CM

## 2013-09-26 DIAGNOSIS — I6529 Occlusion and stenosis of unspecified carotid artery: Secondary | ICD-10-CM

## 2013-09-26 DIAGNOSIS — D649 Anemia, unspecified: Secondary | ICD-10-CM

## 2013-09-26 LAB — BASIC METABOLIC PANEL
BUN: 20 mg/dL (ref 6–23)
CALCIUM: 9.2 mg/dL (ref 8.4–10.5)
CO2: 33 mEq/L — ABNORMAL HIGH (ref 19–32)
CREATININE: 0.8 mg/dL (ref 0.4–1.2)
Chloride: 102 mEq/L (ref 96–112)
GFR: 70.12 mL/min (ref >60.00)
Glucose, Bld: 76 mg/dL (ref 70–99)
Potassium: 4.6 mEq/L (ref 3.5–5.1)
Sodium: 141 mEq/L (ref 135–145)

## 2013-09-26 NOTE — Patient Instructions (Signed)
LAB WORK TODAY, BMET  IF YOU GET LIGHTHEADED OR DIZZY THEN HOLD METOPROLOL AND Marlou StarksCALL SCOTT WEAVER, [facility] [phone]  Your physician recommends that you continue on your current medications as directed. Please refer to the Current Medication list given to you today.  Your physician recommends that you schedule a follow-up appointment in: ON 12/11/13 @ 11:15 WITH DR. CRENSHAW

## 2013-09-26 NOTE — Progress Notes (Signed)
[address] [place], [place]  [address] Phone: [phone] Fax:  [phone]  Date:  09/26/2013   ID:  BAANI BOBER, DOB [birth year], MRN [id number]  PCP:  Rene Paci, MD  Cardiologist:  Dr. Olga Millers     History of Present Illness: Julia Black is a 80 y.o. female with a hx of CAD s/p CABG in 2004, CHF, carotid stenosis, HTN, HL, atrial fibrillation. She was admitted in 02/2013 with elevated troponin in the setting of CHF after an ESI. EF was 25-30% with anteroseptal and apical AK on echo. This was felt to represent stress-induced cardiomyopathy. Conservative measures were felt to be appropriate if possible given advanced age. She improved with medical Rx. Myoview 02/2013 was low risk without ischemia. She did have evidence of anterior scar. EF was 49%. Follow up echo in 03/2013 demonstrated normal LV function with moderate diastolic dysfunction, mild to moderate MR with bileaflet mitral valve prolapse and biatrial enlargement. She is not anticoagulated for atrial fibrillation due to history of fall risk.   I saw her 09/03/13 with complaints of dyspnea.  Chest x-ray demonstrated vascular congestion. BNP was elevated. Her Lasix was increased further. Hemoglobin was noted to be markedly depressed and she was referred back to primary care.  She was placed on iron. Recent hemoglobin was improved (8.3=>10).  Hemoccults were negative.  She was placed back on Plavix.  She is doing much better. Her breathing is improved. She sleeps on 2 pillows chronically. She denies PND. She denies significant LE edema. She denies syncope or near syncope. She denies dizziness. She denies chest pain.  Recent Labs: 02/08/2013: TSH 1.480  09/03/2013: Creatinine 0.8; Potassium 4.7; Pro B Natriuretic peptide (BNP) 929.0*  09/05/2013: HDL Cholesterol 43.10; LDL (calc) 35  09/16/2013: Hemoglobin 10.0*  CXR (09/03/13): IMPRESSION: Cardiac enlargement with mild fluid overload. Negative for edema.   Wt  Readings from Last 3 Encounters:  09/26/13 112 lb (50.803 kg)  09/05/13 110 lb 6.4 oz (50.077 kg)  09/03/13 114 lb (51.71 kg)     Past Medical History  Diagnosis Date  . SPINAL STENOSIS   . LOW BACK PAIN     chronic, follows with Nsurg for ESI  . FRACTURE, PELVIS, RIGHT 12/2009  . HIP FRACTURE, LEFT 09/2009    s/p L THA  . ALLERGIC RHINITIS   . Carpal tunnel syndrome   . Hypertension   . HYPERLIPIDEMIA   . GERD   . Arthritis   . Osteoporosis   . Atrial fibrillation     no anticoag due to fall risk/age  . DEPRESSION   . CORONARY ARTERY DISEASE 2004    a.  LHC (01/2003):  3 v CAD => s/p CABG.;   b.  Myoview (02/2013):  Anterior scar; no ischemia; EF 49%.    . Systolic congestive heart failure   . 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 Echo (02/2013):  EF 25-30%.;   b.  Echo (04/04/13):  Mild focal basal septal hypertrophy, EF 60-65%, no RWMA, Gr 2 DD, mild to mod MAC, mild bileaflet MVP, mild to mod MR, mild to mod LAE, mild RAE, PASP 48.  [name] H/O Doppler ultrasound     Renal Artery [place] (09/2010):  No RAS.  . Carotid artery occlusion     Carotid US (05/2010):  RICA 70-99%; LICA 0-49%  . Anemia     Current Outpatient Prescriptions  Medication Sig Dispense Refill  . ALPRAZolam (XANAX) 0.25 MG tablet  TAKE ONE TABLET AT BEDTIME AS NEEDED FORSLEEP OR ANXIETY  30 tablet  3  . Ascorbic Acid (VITAMIN C) 500 MG tablet Take 1,000 mg by mouth daily.       [name]. aspirin 81 MG tablet Take 81 mg by mouth every morning.       . Cholecalciferol (VITAMIN D3) 1000 UNITS CAPS Take by mouth daily.        . clopidogrel (PLAVIX) 75 MG tablet Take 1 tablet (75 mg total) by mouth as directed. [facility] HOLD [facility]      . DOK 100 MG capsule TAKE ONE CAPSULE TWICE A DAY  60 capsule  11  . fentaNYL (DURAGESIC) 50 MCG/HR Place 1 patch (50 mcg total) onto the skin every 3 (three) days.  5 patch  0  . ferrous sulfate 325 (65 FE) MG tablet Take 325 mg by mouth daily with breakfast.      .  furosemide (LASIX) 20 MG tablet 1 TABLET DAILY  30 tablet  11  . HYDROcodone-acetaminophen (NORCO/VICODIN) 5-325 MG per tablet Take 1 tablet by mouth every 6 (six) hours as needed.  120 tablet  0  . LYRICA 50 MG capsule TAKE ONE CAPSULE THREE TIMES DAILY  90 capsule  0  . metoprolol tartrate (LOPRESSOR) 25 MG tablet TAKE ONE-HALF TABLET TWICE DAILY  30 tablet  6  . Multiple Vitamin (MULTIVITAMIN WITH MINERALS) TABS Take 1 tablet by mouth daily.      . nitroGLYCERIN (NITROSTAT) 0.4 MG SL tablet Place 1 tablet (0.4 mg total) under the tongue every 5 (five) minutes as needed for chest pain (upt o 3 doses).  25 tablet  4  . Omega-3 Fatty Acids (FISH OIL) 1000 MG CAPS Take by mouth daily.       . pantoprazole (PROTONIX) 40 MG tablet TAKE ONE TABLET BY MOUTH ONCE DAILY  30 tablet  2  . polyethylene glycol (MIRALAX / GLYCOLAX) packet Take 17 g by mouth every morning.      . potassium chloride SA (K-DUR,KLOR-CON) 20 MEQ tablet TAKE ONE TABLET DAILY AS NEEDED  30 tablet  1  . Probiotic Product (PROBIOTIC DAILY PO) Take 1 each by mouth daily as needed (for GI tract).      . venlafaxine XR (EFFEXOR-XR) 75 MG 24 hr capsule TAKE ONE CAPSULE EACH DAY  30 capsule  5  . zolpidem (AMBIEN) 5 MG tablet TAKE ONE TABLET AT BEDTIME AS NEEDED  30 tablet  3   No current facility-administered medications for this visit.    Allergies:   Amoxicillin; Sporanox; and Sulfa antibiotics   Social History:  The patient  reports that she quit smoking about 35 years ago. She has never used smokeless tobacco. She reports that she does not drink alcohol or use illicit drugs.   Family History:  The patient's family history includes Arthritis in her father, maternal grandfather, maternal grandmother, mother, paternal grandfather, and paternal grandmother; Breast cancer in her daughter and mother; Heart disease in her father and mother; Hypertension in her father and mother.   ROS:  Please see the history of present illness.   No  bleeding.  All other systems reviewed and negative.   PHYSICAL EXAM: VS:  BP 90/58  Pulse 61  Ht 5\' 1"  (1.549 m)  Wt 112 lb (50.803 kg)  BMI 21.17 kg/m2 Well nourished, well developed, in no acute distress HEENT: normal Neck: I cannot assess JVDat 90 Cardiac:  normal S1, S2; RRR; 1-2/6 systolic  murmuralong the LSB Lungs:  Decreased breath sounds bilaterally, ? Crackles at the bases Abd: soft, nontender, no hepatomegaly Ext: trace bilateral ankle edema Skin: warm and dry Neuro:  CNs 2-12 intact, no focal abnormalities noted  EKG:  Sinus bradycardia, HR 61, normal axis, PVCs (trigeminal pattern), nonspecific ST-T wave changes   ASSESSMENT AND PLAN:  1. Anemia: Managed by primary care. Recent Hemoccults were negative. She remains on iron and recent hemoglobin is improved. 2. CAD:  No angina. Continue aspirin, Plavix. Her simvastatin was held recently. I will check with primary care as to when we can resume this. 3. Chronic Diastolic CHF:  Volume improved. Continue current therapy. Check a basic metabolic panel today to followup on renal function and potassium. 4. Atrial Fibrillation:  Maintaining NSR.  She is not on anticoagulation due to fall risk. 5. Carotid stenosis:  Continue aspirin and Plavix. Followup with Dr. Jens Som for further recommendations. 6. Hypertension:  Blood pressure somewhat lower than baseline. If she develops dizziness or lightheadedness, I have asked her to hold her metoprolol and call. 7. Hyperlipidemia:  I will check with primary care to see if we can resume statin.  Simvastatin 10 mg each bedtime is probably adequate for her. 8. Disposition: Follow up with Dr. Jens Som in 3 months.  Signed, Tereso Newcomer, PA-C  09/26/2013 11:42 AM

## 2013-09-29 ENCOUNTER — Telehealth: Payer: Self-pay | Admitting: *Deleted

## 2013-09-29 NOTE — Telephone Encounter (Signed)
pt's daughter Darl PikesSusan notified about lab results with verbal understanding

## 2013-10-06 ENCOUNTER — Other Ambulatory Visit: Payer: Self-pay | Admitting: Internal Medicine

## 2013-10-06 NOTE — Telephone Encounter (Signed)
Left msg on vm requesting refill on her hydrocodone. Pt also states she is almost out of her fentanyl patches would like refill on patches too...[name]/lmb

## 2013-10-07 ENCOUNTER — Other Ambulatory Visit: Payer: Self-pay | Admitting: Internal Medicine

## 2013-10-07 MED ORDER — FENTANYL 50 MCG/HR TD PT72: 50.0000 ug | MEDICATED_PATCH | TRANSDERMAL | Status: DC

## 2013-10-07 MED ORDER — HYDROCODONE-ACETAMINOPHEN 5-325 MG PO TABS: 1.0000 | ORAL_TABLET | Freq: Four times a day (QID) | ORAL | Status: DC | PRN

## 2013-10-07 NOTE — Telephone Encounter (Signed)
Notified pt rx's ready for pick-up../lmb 

## 2013-10-07 NOTE — Telephone Encounter (Signed)
[facility]ed script [facility] to [facility].../lmb 

## 2013-10-07 NOTE — Telephone Encounter (Signed)
Ok done

## 2013-10-20 ENCOUNTER — Other Ambulatory Visit: Payer: Self-pay | Admitting: Physician Assistant

## 2013-10-22 ENCOUNTER — Other Ambulatory Visit: Payer: Self-pay | Admitting: *Deleted

## 2013-10-22 MED ORDER — PREGABALIN 50 MG PO CAPS: 50.0000 mg | ORAL_CAPSULE | Freq: Three times a day (TID) | ORAL | Status: DC

## 2013-10-23 NOTE — Telephone Encounter (Signed)
F[facility]ed scripts back to [facility]...[name]/lmb

## 2013-11-03 ENCOUNTER — Other Ambulatory Visit: Payer: Self-pay | Admitting: *Deleted

## 2013-11-03 MED ORDER — FENTANYL 50 MCG/HR TD PT72: 50.0000 ug | MEDICATED_PATCH | TRANSDERMAL | Status: DC

## 2013-11-03 MED ORDER — HYDROCODONE-ACETAMINOPHEN 5-325 MG PO TABS: 1.0000 | ORAL_TABLET | Freq: Four times a day (QID) | ORAL | Status: DC | PRN

## 2013-11-03 NOTE — Telephone Encounter (Signed)
Notified pt rx's ready for pick-up../lmb 

## 2013-11-03 NOTE — Telephone Encounter (Signed)
Left msg on vm stating needing refill on her hydrocodone & fentanyl patches...Julia Black/lmb

## 2013-11-16 ENCOUNTER — Other Ambulatory Visit (HOSPITAL_COMMUNITY): Payer: Self-pay | Admitting: Cardiology

## 2013-12-02 ENCOUNTER — Other Ambulatory Visit: Payer: Self-pay | Admitting: Physician Assistant

## 2013-12-02 MED ORDER — FENTANYL 50 MCG/HR TD PT72: 50.0000 ug | MEDICATED_PATCH | TRANSDERMAL | Status: DC

## 2013-12-02 MED ORDER — HYDROCODONE-ACETAMINOPHEN 5-325 MG PO TABS: 1.0000 | ORAL_TABLET | Freq: Four times a day (QID) | ORAL | Status: DC | PRN

## 2013-12-02 NOTE — Telephone Encounter (Signed)
Notified pt rx's ready for pick-up../lmb 

## 2013-12-02 NOTE — Telephone Encounter (Signed)
Pt left vm on Monday 12/01/13 (office was closed) requesting refills on her hydrocodone & fentanyl patches...[name]

## 2013-12-11 ENCOUNTER — Ambulatory Visit (INDEPENDENT_AMBULATORY_CARE_PROVIDER_SITE_OTHER): Payer: Medicare Other | Admitting: Cardiology

## 2013-12-11 ENCOUNTER — Encounter: Payer: Self-pay | Admitting: Cardiology

## 2013-12-11 VITALS — BP 140/62 | HR 55 | Ht 61.0 in | Wt 107.4 lb

## 2013-12-11 DIAGNOSIS — I679 Cerebrovascular disease, unspecified: Secondary | ICD-10-CM

## 2013-12-11 DIAGNOSIS — E785 Hyperlipidemia, unspecified: Secondary | ICD-10-CM

## 2013-12-11 DIAGNOSIS — I2510 Atherosclerotic heart disease of native coronary artery without angina pectoris: Secondary | ICD-10-CM

## 2013-12-11 DIAGNOSIS — I4891 Unspecified atrial fibrillation: Secondary | ICD-10-CM

## 2013-12-11 DIAGNOSIS — I509 Heart failure, unspecified: Secondary | ICD-10-CM

## 2013-12-11 MED ORDER — PRAVASTATIN SODIUM 40 MG PO TABS: 40.0000 mg | ORAL_TABLET | Freq: Every evening | ORAL | Status: DC

## 2013-12-11 NOTE — Assessment & Plan Note (Signed)
Blood pressure controlled. Continue present medications. 

## 2013-12-11 NOTE — Progress Notes (Signed)
HPI: FU CAD s/p CABG in 2004, CHF, carotid stenosis, HTN, HL, atrial fibrillation. She was admitted in 02/2013 with elevated troponin in the setting of CHF. EF was 25-30% with anteroseptal and apical AK on echo. This was felt to represent stress-induced cardiomyopathy. Conservative measures were felt to be appropriate if possible given advanced age. She improved with medical Rx. Myoview 02/2013 was low risk without ischemia. She did have evidence of anterior scar. EF was 49%. Follow up echo in 03/2013 demonstrated normal LV function with moderate diastolic dysfunction, mild to moderate MR with bileaflet mitral valve prolapse and biatrial enlargement. She is not anticoagulated for atrial fibrillation due to history of fall risk. Since she was last seen she denies dyspnea, chest pain, palpitations or syncope. Her pedal edema is controlled with present dose of Lasix.     Current Outpatient Prescriptions  Medication Sig Dispense Refill  . ALPRAZolam (XANAX) 0.25 MG tablet TAKE ONE TABLET AT BEDTIME AS NEEDED FORSLEEP OR ANXIETY  30 tablet  3  . Ascorbic Acid (VITAMIN C) 500 MG tablet Take 1,000 mg by mouth daily.       [name]. aspirin 81 MG tablet Take 81 mg by mouth every morning.       . Cholecalciferol (VITAMIN D3) 1000 UNITS CAPS Take by mouth daily.        . clopidogrel (PLAVIX) 75 MG tablet Take 1 tablet (75 mg total) by mouth as directed. ON HOLD PER SC[facility] WEAVER, [facility] 09/03/13      . DOK 100 MG capsule TAKE ONE CAPSULE TWICE A DAY  60 capsule  11  . fentaNYL (DURAGESIC) 50 MCG/HR Place 1 patch (50 mcg total) onto the skin every 3 (three) days.  10 patch  0  . ferrous sulfate 325 (65 FE) MG tablet Take 325 mg by mouth daily with breakfast.      . furosemide (LASIX) 20 MG tablet 1 TABLET DAILY  30 tablet  11  . HYDROcodone-acetaminophen (NORCO/VICODIN) 5-325 MG per tablet Take 1 tablet by mouth every 6 (six) hours as needed.  120 tablet  0  . LYRICA 50 MG capsule TAKE ONE CAPSULE THREE TIMES DAILY   90 capsule  5  . metoprolol tartrate (LOPRESSOR) 25 MG tablet TAKE ONE-HALF TABLET TWICE DAILY  30 tablet  6  . Multiple Vitamin (MULTIVITAMIN WITH MINERALS) TABS Take 1 tablet by mouth daily.      . nitroGLYCERIN (NITROSTAT) 0.4 MG SL tablet Place 1 tablet (0.4 mg total) under the tongue every 5 (five) minutes as needed for chest pain (upt o 3 doses).  25 tablet  4  . Omega-3 Fatty Acids (FISH OIL) 1000 MG CAPS Take by mouth daily.       . pantoprazole (PROTONIX) 40 MG tablet TAKE ONE TABLET BY MOUTH ONCE DAILY  30 tablet  0  . polyethylene glycol (MIRALAX / GLYCOLAX) packet Take 17 g by mouth every morning.      . potassium chloride SA (K-DUR,KLOR-CON) 20 MEQ tablet TAKE ONE TABLET DAILY AS NEEDED  30 tablet  3  . Probiotic Product (PROBIOTIC DAILY PO) Take 1 each by mouth daily as needed (for GI tract).      . venlafaxine XR (EFFEXOR-XR) 75 MG 24 hr capsule TAKE ONE CAPSULE EACH DAY  30 capsule  5  . zolpidem (AMBIEN) 5 MG tablet TAKE ONE TABLET AT BEDTIME AS NEEDED  30 tablet  5   No current facility-administered medications for this visit.  Past Medical History  Diagnosis Date  . SPINAL STENOSIS   . LOW BACK PAIN     chronic, follows with Nsurg for ESI  . FRACTURE, PELVIS, RIGHT 12/2009  . HIP FRACTURE, LEFT 09/2009    s/p L THA  . ALLERGIC RHINITIS   . Carpal tunnel syndrome   . Hypertension   . HYPERLIPIDEMIA   . GERD   . Arthritis   . Osteoporosis   . Atrial fibrillation     no anticoag due to fall risk/age  . DEPRESSION   . CORONARY ARTERY DISEASE 2004    a.  LHC (01/2003):  3 v CAD => s/p CABG.;   b.  Myoview (02/2013):  Anterior scar; no ischemia; EF 49%.    . Systolic congestive heart failure   . 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 Echo (02/2013):  EF 25-30%.;   b.  Echo (04/04/13):  Mild focal basal septal hypertrophy, EF 60-65%, no RWMA, Gr 2 DD, mild to mod MAC, mild bileaflet MVP, mild to mod MR, mild to mod LAE, mild RAE, PASP 48.  [name] H/O Doppler  ultrasound     Renal Artery [place] (09/2010):  No RAS.  . Carotid artery occlusion     Carotid US (05/2010):  RICA 70-99%; LICA 0-49%  . Anemia     Past Surgical History  Procedure Laterality Date  . (l) hip hemiarthroplasty  09/2009  . Appendectomy  1940  . Eye surgery      both cataracts  . Coronary artery bypass graft  2004  . Carpal tunnel release  2004    right and left-dsc  . Ulnar nerve transposition  2009    lt   . Vein ligation      legs  . Colonoscopy    . I&d extremity Right 12/26/2012    Procedure: IRRIGATION AND DEBRIDEMENT OF RIGHT LEG, SURGICAL PREP PLACEMENT OF A CELL AND VAC   ;  Surgeon: Wayland Denis, DO;  Location: [facility];  Service: Plastics;  Laterality: Right;    History   Social History  . Marital Status: Divorced    Spouse Name: N/A    Number of Children: N/A  . Years of Education: N/A   Occupational History  . Not on file.   Social History Main Topics  . Smoking status: Former Smoker    Quit date: 07/10/1978  . Smokeless tobacco: Never Used     Comment: Lives alone in indep living facility @ [place]. Active ADLS but does not drive dur to macular degen. supportive family members nearby  . Alcohol Use: No  . Drug Use: No  . Sexual Activity: No   Other Topics Concern  . Not on file   Social History Narrative   ACP - HCPOA Margarette Asal (c) [phone]; No CPR,  No mechanical ventilation.     ROS: no fevers or chills, productive cough, hemoptysis, dysphasia, odynophagia, melena, hematochezia, dysuria, hematuria, rash, seizure activity, orthopnea, PND, pedal edema, claudication. Remaining systems are negative.  Physical Exam: Well-developed frail in no acute distress.  Skin is warm and dry.  HEENT is normal.  Neck is supple.  Chest is clear to auscultation with normal expansion.  Cardiovascular exam is regular rate and rhythm.  Abdominal exam nontender or distended. No masses palpated. Extremities show trace edema. neuro  grossly intact

## 2013-12-11 NOTE — Assessment & Plan Note (Signed)
Resumed Pravachol 40 mg daily. Check lipids and liver in 4 weeks.

## 2013-12-11 NOTE — Assessment & Plan Note (Signed)
Euvolemic on examination. Continue present dose of Lasix. Check potassium and renal function. 

## 2013-12-11 NOTE — Assessment & Plan Note (Signed)
Continue aspirin and resume statin. 

## 2013-12-11 NOTE — Assessment & Plan Note (Signed)
Remains in sinus rhythm on examination. Continue beta blocker. Not on Coumadin given history of fall risk. Continue aspirin and Plavix.

## 2013-12-11 NOTE — Assessment & Plan Note (Signed)
Continue aspirin and resume statin.I discussed with patient today. Given age we will not pursue further carotid Dopplers and she is in agreement.

## 2013-12-11 NOTE — Patient Instructions (Signed)
Your physician wants you to follow-up in: 6 MONTHS WITH DR CRENSHAW You will receive a reminder letter in the mail two months in advance. If you don't receive a letter, please call our office to schedule the follow-up appointment.   START PRAVASTATIN 40 MG ONCE DAILY  Your physician recommends that you return for lab work in: 4 WEEKS- DO NOT EAT PRIOR TO LAB WORK 

## 2013-12-15 ENCOUNTER — Other Ambulatory Visit (HOSPITAL_COMMUNITY): Payer: Self-pay | Admitting: Cardiology

## 2013-12-15 ENCOUNTER — Other Ambulatory Visit: Payer: Self-pay | Admitting: Internal Medicine

## 2013-12-31 ENCOUNTER — Telehealth: Payer: Self-pay | Admitting: *Deleted

## 2013-12-31 NOTE — Telephone Encounter (Signed)
OK to fill both prescription with additional refills x0. Need OV q 3 mo Thank you!

## 2013-12-31 NOTE — Telephone Encounter (Signed)
Requesting refills on her hydrocodone & fentanyl patch...Julia Black/lmb

## 2014-01-01 MED ORDER — HYDROCODONE-ACETAMINOPHEN 5-325 MG PO TABS: 1.0000 | ORAL_TABLET | Freq: Four times a day (QID) | ORAL | Status: DC | PRN

## 2014-01-01 MED ORDER — FENTANYL 50 MCG/HR TD PT72: 50.0000 ug | MEDICATED_PATCH | TRANSDERMAL | Status: DC

## 2014-01-01 NOTE — Telephone Encounter (Signed)
Notified pt rx's ready for pick-up../lmb 

## 2014-01-08 ENCOUNTER — Other Ambulatory Visit (INDEPENDENT_AMBULATORY_CARE_PROVIDER_SITE_OTHER): Payer: Medicare Other

## 2014-01-08 DIAGNOSIS — E785 Hyperlipidemia, unspecified: Secondary | ICD-10-CM

## 2014-01-08 LAB — HEPATIC FUNCTION PANEL
ALBUMIN: 3.4 g/dL — AB (ref 3.5–5.2)
ALK PHOS: 155 U/L — AB (ref 39–117)
ALT: 37 U/L — ABNORMAL HIGH (ref 0–35)
AST: 47 U/L — ABNORMAL HIGH (ref 0–37)
Bilirubin, Direct: 0.3 mg/dL (ref 0.0–0.3)
TOTAL PROTEIN: 6.7 g/dL (ref 6.0–8.3)
Total Bilirubin: 1 mg/dL (ref 0.2–1.2)

## 2014-01-08 LAB — BASIC METABOLIC PANEL
BUN: 26 mg/dL — ABNORMAL HIGH (ref 6–23)
CALCIUM: 9 mg/dL (ref 8.4–10.5)
CHLORIDE: 104 meq/L (ref 96–112)
CO2: 31 meq/L (ref 19–32)
CREATININE: 0.7 mg/dL (ref 0.4–1.2)
GFR: 80.28 mL/min (ref >60.00)
Glucose, Bld: 102 mg/dL — ABNORMAL HIGH (ref 70–99)
Potassium: 4.7 mEq/L (ref 3.5–5.1)
SODIUM: 139 meq/L (ref 135–145)

## 2014-01-08 LAB — LIPID PANEL
CHOL/HDL RATIO: 2
Cholesterol: 131 mg/dL (ref 0–200)
HDL: 55.8 mg/dL (ref >39.00)
LDL Cholesterol: 56 mg/dL (ref 0–99)
NONHDL: 75.2
TRIGLYCERIDES: 95 mg/dL (ref 0.0–149.0)
VLDL: 19 mg/dL (ref 0.0–40.0)

## 2014-01-13 ENCOUNTER — Other Ambulatory Visit: Payer: Self-pay | Admitting: *Deleted

## 2014-01-13 DIAGNOSIS — R945 Abnormal results of liver function studies: Principal | ICD-10-CM

## 2014-01-13 DIAGNOSIS — R7989 Other specified abnormal findings of blood chemistry: Secondary | ICD-10-CM

## 2014-01-23 ENCOUNTER — Other Ambulatory Visit: Payer: Self-pay | Admitting: Internal Medicine

## 2014-01-26 NOTE — Telephone Encounter (Signed)
MD out of office this wee. Is it ok to refills...[name]/lmb

## 2014-01-26 NOTE — Telephone Encounter (Signed)
Julia Black , she can have enough until Julia Black back ( 80 years old; I've never seen her). Thanks, Fluor CorporationHopp

## 2014-01-26 NOTE — Telephone Encounter (Signed)
[facility]ed script [facility] to [facility].../lmb 

## 2014-01-27 ENCOUNTER — Encounter: Payer: Self-pay | Admitting: Internal Medicine

## 2014-01-27 ENCOUNTER — Other Ambulatory Visit: Payer: Self-pay

## 2014-01-27 MED ORDER — FENTANYL 50 MCG/HR TD PT72: 50.0000 ug | MEDICATED_PATCH | TRANSDERMAL | Status: DC

## 2014-01-27 MED ORDER — HYDROCODONE-ACETAMINOPHEN 5-325 MG PO TABS: 1.0000 | ORAL_TABLET | Freq: Four times a day (QID) | ORAL | Status: DC | PRN

## 2014-01-27 NOTE — Telephone Encounter (Signed)
Called pt to inform MD will be in tomorrow and that I will call when rx is ready for pick up.

## 2014-01-28 ENCOUNTER — Telehealth: Payer: Self-pay

## 2014-01-28 NOTE — Telephone Encounter (Signed)
Pt informed rx is ready for pick up

## 2014-02-02 ENCOUNTER — Other Ambulatory Visit: Payer: Self-pay | Admitting: Internal Medicine

## 2014-02-06 ENCOUNTER — Telehealth: Payer: Self-pay | Admitting: Cardiology

## 2014-02-06 NOTE — Telephone Encounter (Signed)
New message     Pt is still taking plavix.  Is she supposed to be off of it?

## 2014-02-06 NOTE — Telephone Encounter (Signed)
Spoke with pt dtr, Aware of dr crenshaw's recommendations.  

## 2014-02-06 NOTE — Telephone Encounter (Signed)
Dc plavix and continue asa. Olga MillersBrian Crenshaw

## 2014-02-06 NOTE — Telephone Encounter (Signed)
Spoke with pt dtr, she questions if the pt needs to cont the plavix. She is having a lot of bruising. Will forward for dr Jens Somcrenshaw review

## 2014-02-24 ENCOUNTER — Other Ambulatory Visit: Payer: Self-pay

## 2014-02-24 MED ORDER — FENTANYL 50 MCG/HR TD PT72: 50.0000 ug | MEDICATED_PATCH | TRANSDERMAL | Status: DC

## 2014-02-24 MED ORDER — HYDROCODONE-ACETAMINOPHEN 5-325 MG PO TABS: 1.0000 | ORAL_TABLET | Freq: Four times a day (QID) | ORAL | Status: DC | PRN

## 2014-02-24 NOTE — Telephone Encounter (Signed)
LVM that rx are ready for pick up at the office.

## 2014-03-17 ENCOUNTER — Other Ambulatory Visit: Payer: Self-pay | Admitting: Internal Medicine

## 2014-03-17 NOTE — Telephone Encounter (Signed)
Done hardcopy to robin  

## 2014-03-18 NOTE — Telephone Encounter (Signed)
Faxed hardcopy for Alprazolam to [facility] [place] 

## 2014-03-25 ENCOUNTER — Telehealth: Payer: Self-pay

## 2014-03-25 MED ORDER — HYDROCODONE-ACETAMINOPHEN 5-325 MG PO TABS: 1.0000 | ORAL_TABLET | Freq: Four times a day (QID) | ORAL | Status: DC | PRN

## 2014-03-25 NOTE — Telephone Encounter (Signed)
Pt called in for a refill of the hydrocodone.  Is this okay to fill? LOV: 08/2013

## 2014-03-25 NOTE — Telephone Encounter (Signed)
done

## 2014-03-25 NOTE — Telephone Encounter (Signed)
Pt informed rx is up front and ready for pick up.

## 2014-03-28 ENCOUNTER — Other Ambulatory Visit: Payer: Self-pay | Admitting: Internal Medicine

## 2014-03-30 ENCOUNTER — Other Ambulatory Visit: Payer: Self-pay

## 2014-03-30 MED ORDER — HYDROCODONE-ACETAMINOPHEN 5-325 MG PO TABS: 1.0000 | ORAL_TABLET | Freq: Four times a day (QID) | ORAL | Status: DC | PRN

## 2014-03-30 NOTE — Telephone Encounter (Signed)
Pt inform rx will be ready for pick this afternoon.

## 2014-04-07 LAB — HEPATIC FUNCTION PANEL
ALT: 28 U/L (ref 0–35)
AST: 34 U/L (ref 0–37)
Albumin: 3.7 g/dL (ref 3.5–5.2)
Alkaline Phosphatase: 142 U/L — ABNORMAL HIGH (ref 39–117)
BILIRUBIN DIRECT: 0.1 mg/dL (ref 0.0–0.3)
BILIRUBIN INDIRECT: 0.4 mg/dL (ref 0.2–1.2)
BILIRUBIN TOTAL: 0.5 mg/dL (ref 0.2–1.2)
Total Protein: 6.2 g/dL (ref 6.0–8.3)

## 2014-04-10 ENCOUNTER — Other Ambulatory Visit (HOSPITAL_COMMUNITY): Payer: Self-pay | Admitting: Cardiology

## 2014-04-10 ENCOUNTER — Other Ambulatory Visit: Payer: Self-pay | Admitting: Internal Medicine

## 2014-04-11 ENCOUNTER — Other Ambulatory Visit: Payer: Self-pay

## 2014-04-23 ENCOUNTER — Telehealth: Payer: Self-pay

## 2014-04-23 ENCOUNTER — Other Ambulatory Visit: Payer: Self-pay

## 2014-04-23 MED ORDER — FENTANYL 50 MCG/HR TD PT72: 50.0000 ug | MEDICATED_PATCH | TRANSDERMAL | Status: DC

## 2014-04-23 MED ORDER — HYDROCODONE-ACETAMINOPHEN 5-325 MG PO TABS: 1.0000 | ORAL_TABLET | Freq: Four times a day (QID) | ORAL | Status: DC | PRN

## 2014-04-23 NOTE — Telephone Encounter (Deleted)
Pt called wondering about you rxs being refilled. She didn't know when to come and pick them up. Please advise 

## 2014-04-23 NOTE — Telephone Encounter (Signed)
Pt called wondering about you rxs being refilled. She didn't know when to come and pick them up. Please advise

## 2014-04-24 ENCOUNTER — Other Ambulatory Visit: Payer: Self-pay

## 2014-04-24 ENCOUNTER — Telehealth: Payer: Self-pay | Admitting: Internal Medicine

## 2014-04-24 NOTE — Telephone Encounter (Signed)
Pt called yesterday to obtain pain patch, please see 10/15 phone note regarding refill. Pt requesting another provider refill unless main provider returns and clarifies.

## 2014-04-24 NOTE — Telephone Encounter (Signed)
Duplicate msg. See previous msg on picking up scripts...[name]/lmb

## 2014-04-24 NOTE — Telephone Encounter (Signed)
Patient's daughter called in requesting scripts to be done today b/c mother needs pain meds over the weekend.

## 2014-04-24 NOTE — Telephone Encounter (Signed)
Rx's was in stephanie folder md has signed. Called pt no answer LMOM rx's ready for pick=up...[name]/lmb

## 2014-04-24 NOTE — Telephone Encounter (Signed)
By emr, norco and pain patch already refilled oct 15

## 2014-04-27 ENCOUNTER — Telehealth: Payer: Self-pay | Admitting: Internal Medicine

## 2014-04-27 ENCOUNTER — Other Ambulatory Visit (INDEPENDENT_AMBULATORY_CARE_PROVIDER_SITE_OTHER): Payer: Medicare Other

## 2014-04-27 DIAGNOSIS — R300 Dysuria: Secondary | ICD-10-CM

## 2014-04-27 LAB — URINALYSIS, ROUTINE W REFLEX MICROSCOPIC
Bilirubin Urine: NEGATIVE
Hgb urine dipstick: NEGATIVE
KETONES UR: NEGATIVE
Nitrite: NEGATIVE
PH: 5.5 (ref 5.0–8.0)
Total Protein, Urine: NEGATIVE
Urine Glucose: NEGATIVE
Urobilinogen, UA: 0.2 (ref 0.0–1.0)

## 2014-04-27 NOTE — Telephone Encounter (Signed)
Spoke to pt and informed that UA order is ready per MD.

## 2014-04-27 NOTE — Telephone Encounter (Signed)
Patient believes she has a UTI.  She is requesting to be able to go to the lab to leave a specimen.

## 2014-04-28 ENCOUNTER — Other Ambulatory Visit: Payer: Self-pay | Admitting: Internal Medicine

## 2014-04-28 ENCOUNTER — Other Ambulatory Visit (HOSPITAL_COMMUNITY): Payer: Self-pay | Admitting: Cardiology

## 2014-06-01 ENCOUNTER — Telehealth: Payer: Self-pay

## 2014-06-01 MED ORDER — HYDROCODONE-ACETAMINOPHEN 5-325 MG PO TABS
1.0000 | ORAL_TABLET | Freq: Four times a day (QID) | ORAL | Status: DC | PRN
Start: 2014-06-01 — End: 2014-07-22

## 2014-06-01 MED ORDER — FENTANYL 50 MCG/HR TD PT72: 50.0000 ug | MEDICATED_PATCH | TRANSDERMAL | Status: DC

## 2014-06-01 NOTE — Telephone Encounter (Signed)
Pt called wanted a refill of hydrocodone and pain patch.

## 2014-06-09 ENCOUNTER — Inpatient Hospital Stay (HOSPITAL_COMMUNITY)
Admission: EM | Admit: 2014-06-09 | Discharge: 2014-06-15 | DRG: 177 | Disposition: A | Discharge status: Home Health | Payer: Medicare Other | Attending: Internal Medicine | Admitting: Internal Medicine

## 2014-06-09 ENCOUNTER — Emergency Department (HOSPITAL_COMMUNITY): Payer: Medicare Other

## 2014-06-09 ENCOUNTER — Encounter (HOSPITAL_COMMUNITY): Payer: Self-pay | Admitting: Emergency Medicine

## 2014-06-09 DIAGNOSIS — E785 Hyperlipidemia, unspecified: Secondary | ICD-10-CM | POA: Diagnosis present

## 2014-06-09 DIAGNOSIS — K5900 Constipation, unspecified: Secondary | ICD-10-CM | POA: Diagnosis not present

## 2014-06-09 DIAGNOSIS — Z951 Presence of aortocoronary bypass graft: Secondary | ICD-10-CM

## 2014-06-09 DIAGNOSIS — Z66 Do not resuscitate: Secondary | ICD-10-CM | POA: Diagnosis present

## 2014-06-09 DIAGNOSIS — J440 Chronic obstructive pulmonary disease with acute lower respiratory infection: Secondary | ICD-10-CM | POA: Diagnosis present

## 2014-06-09 DIAGNOSIS — R7989 Other specified abnormal findings of blood chemistry: Secondary | ICD-10-CM | POA: Diagnosis present

## 2014-06-09 DIAGNOSIS — J189 Pneumonia, unspecified organism: Secondary | ICD-10-CM

## 2014-06-09 DIAGNOSIS — J9600 Acute respiratory failure, unspecified whether with hypoxia or hypercapnia: Secondary | ICD-10-CM | POA: Diagnosis present

## 2014-06-09 DIAGNOSIS — I5043 Acute on chronic combined systolic (congestive) and diastolic (congestive) heart failure: Secondary | ICD-10-CM | POA: Diagnosis present

## 2014-06-09 DIAGNOSIS — I9589 Other hypotension: Secondary | ICD-10-CM

## 2014-06-09 DIAGNOSIS — M810 Age-related osteoporosis without current pathological fracture: Secondary | ICD-10-CM | POA: Diagnosis present

## 2014-06-09 DIAGNOSIS — J9601 Acute respiratory failure with hypoxia: Secondary | ICD-10-CM

## 2014-06-09 DIAGNOSIS — Z881 Allergy status to other antibiotic agents status: Secondary | ICD-10-CM

## 2014-06-09 DIAGNOSIS — K219 Gastro-esophageal reflux disease without esophagitis: Secondary | ICD-10-CM | POA: Diagnosis present

## 2014-06-09 DIAGNOSIS — A481 Legionnaires' disease: Secondary | ICD-10-CM | POA: Diagnosis present

## 2014-06-09 DIAGNOSIS — H3530 Unspecified macular degeneration: Secondary | ICD-10-CM | POA: Diagnosis present

## 2014-06-09 DIAGNOSIS — I4891 Unspecified atrial fibrillation: Secondary | ICD-10-CM | POA: Diagnosis present

## 2014-06-09 DIAGNOSIS — Z882 Allergy status to sulfonamides status: Secondary | ICD-10-CM | POA: Diagnosis not present

## 2014-06-09 DIAGNOSIS — R0602 Shortness of breath: Secondary | ICD-10-CM | POA: Diagnosis not present

## 2014-06-09 DIAGNOSIS — I214 Non-ST elevation (NSTEMI) myocardial infarction: Secondary | ICD-10-CM | POA: Diagnosis present

## 2014-06-09 DIAGNOSIS — R079 Chest pain, unspecified: Secondary | ICD-10-CM | POA: Insufficient documentation

## 2014-06-09 DIAGNOSIS — R739 Hyperglycemia, unspecified: Secondary | ICD-10-CM | POA: Diagnosis present

## 2014-06-09 DIAGNOSIS — Z87891 Personal history of nicotine dependence: Secondary | ICD-10-CM | POA: Diagnosis not present

## 2014-06-09 DIAGNOSIS — I5033 Acute on chronic diastolic (congestive) heart failure: Secondary | ICD-10-CM | POA: Diagnosis present

## 2014-06-09 DIAGNOSIS — I429 Cardiomyopathy, unspecified: Secondary | ICD-10-CM | POA: Diagnosis present

## 2014-06-09 DIAGNOSIS — Z7982 Long term (current) use of aspirin: Secondary | ICD-10-CM | POA: Diagnosis not present

## 2014-06-09 DIAGNOSIS — I10 Essential (primary) hypertension: Secondary | ICD-10-CM | POA: Diagnosis present

## 2014-06-09 DIAGNOSIS — I2510 Atherosclerotic heart disease of native coronary artery without angina pectoris: Secondary | ICD-10-CM | POA: Diagnosis present

## 2014-06-09 DIAGNOSIS — Z96642 Presence of left artificial hip joint: Secondary | ICD-10-CM | POA: Diagnosis present

## 2014-06-09 LAB — CBC WITH DIFFERENTIAL/PLATELET
BASOS ABS: 0.1 10*3/uL (ref 0.0–0.1)
BASOS PCT: 0 % (ref 0–1)
EOS PCT: 0 % (ref 0–5)
Eosinophils Absolute: 0 10*3/uL (ref 0.0–0.7)
HCT: 42.6 % (ref 36.0–46.0)
Hemoglobin: 13.4 g/dL (ref 12.0–15.0)
LYMPHS ABS: 1 10*3/uL (ref 0.7–4.0)
Lymphocytes Relative: 7 % — ABNORMAL LOW (ref 12–46)
MCH: 29.8 pg (ref 26.0–34.0)
MCHC: 31.5 g/dL (ref 30.0–36.0)
MCV: 94.7 fL (ref 78.0–100.0)
Monocytes Absolute: 1.3 10*3/uL — ABNORMAL HIGH (ref 0.1–1.0)
Monocytes Relative: 10 % (ref 3–12)
NEUTROS PCT: 83 % — AB (ref 43–77)
Neutro Abs: 11.3 10*3/uL — ABNORMAL HIGH (ref 1.7–7.7)
PLATELETS: 176 10*3/uL (ref 150–400)
RBC: 4.5 MIL/uL (ref 3.87–5.11)
RDW: 13.8 % (ref 11.5–15.5)
WBC: 13.7 10*3/uL — ABNORMAL HIGH (ref 4.0–10.5)

## 2014-06-09 LAB — I-STAT TROPONIN, ED: Troponin i, poc: 1.21 ng/mL (ref 0.00–0.08)

## 2014-06-09 LAB — COMPREHENSIVE METABOLIC PANEL
ALT: 36 U/L — ABNORMAL HIGH (ref 0–35)
AST: 47 U/L — ABNORMAL HIGH (ref 0–37)
Albumin: 3.2 g/dL — ABNORMAL LOW (ref 3.5–5.2)
Alkaline Phosphatase: 206 U/L — ABNORMAL HIGH (ref 39–117)
Anion gap: 18 — ABNORMAL HIGH (ref 5–15)
BILIRUBIN TOTAL: 0.9 mg/dL (ref 0.3–1.2)
BUN: 24 mg/dL — ABNORMAL HIGH (ref 6–23)
CALCIUM: 9 mg/dL (ref 8.4–10.5)
CHLORIDE: 97 meq/L (ref 96–112)
CO2: 24 mEq/L (ref 19–32)
CREATININE: 0.74 mg/dL (ref 0.50–1.10)
GFR calc non Af Amer: 71 mL/min — ABNORMAL LOW (ref >90)
GFR, EST AFRICAN AMERICAN: 82 mL/min — AB (ref >90)
GLUCOSE: 140 mg/dL — AB (ref 70–99)
Potassium: 4.6 mEq/L (ref 3.7–5.3)
Sodium: 139 mEq/L (ref 137–147)
Total Protein: 7.1 g/dL (ref 6.0–8.3)

## 2014-06-09 LAB — PRO B NATRIURETIC PEPTIDE: Pro B Natriuretic peptide (BNP): 2199 pg/mL — ABNORMAL HIGH (ref 0–450)

## 2014-06-09 MED ORDER — AZITHROMYCIN 500 MG IV SOLR
500.0000 mg | INTRAVENOUS | Status: DC
  Administered 2014-06-09 – 2014-06-10 (×2): 500 mg via INTRAVENOUS
  Filled 2014-06-09 (×3): qty 500

## 2014-06-09 MED ORDER — SODIUM CHLORIDE 0.9 % IV SOLN
Freq: Once | INTRAVENOUS | Status: AC
  Administered 2014-06-09: 23:00:00 via INTRAVENOUS

## 2014-06-09 MED ORDER — FUROSEMIDE 10 MG/ML IJ SOLN
40.0000 mg | Freq: Once | INTRAMUSCULAR | Status: AC
  Administered 2014-06-09: 40 mg via INTRAVENOUS
  Filled 2014-06-09: qty 4

## 2014-06-09 MED ORDER — HEPARIN BOLUS VIA INFUSION
3000.0000 [IU] | Freq: Once | INTRAVENOUS | Status: AC
  Administered 2014-06-09: 3000 [IU] via INTRAVENOUS
  Filled 2014-06-09: qty 3000

## 2014-06-09 MED ORDER — HEPARIN (PORCINE) IN NACL 100-0.45 UNIT/ML-% IJ SOLN
1150.0000 [IU]/h | INTRAMUSCULAR | Status: DC
  Administered 2014-06-09: 650 [IU]/h via INTRAVENOUS
  Administered 2014-06-11 – 2014-06-12 (×2): 1050 [IU]/h via INTRAVENOUS
  Filled 2014-06-09 (×5): qty 250

## 2014-06-09 MED ORDER — CEFTRIAXONE SODIUM 1 G IJ SOLR
1.0000 g | INTRAMUSCULAR | Status: DC
  Administered 2014-06-09 – 2014-06-10 (×2): 1 g via INTRAVENOUS
  Filled 2014-06-09 (×3): qty 10

## 2014-06-09 NOTE — ED Notes (Signed)
EDP at bedside  

## 2014-06-09 NOTE — Consult Note (Signed)
Cardiologist: Jens Som Reason for Consult:  Shortness of breath  Referring Physician:   [name] is an 80 y.o. female.  HPI:   She is a 80 year old female with a history of coronary artery disease status post coronary artery bypass grafting in 2004, CHF, carotid stenosis, hypertension, hyperlipidemia, atrial fibrillation.  She is not on Coumadin given history of fall risk.  However, she is on aspirin and Plavix.  In August 2014 she was admitted with CHF and had an elevated troponin.  Her last 2-D echocardiogram was 04/04/2013 and her ejection fraction was 60-65% with normal wall motion. Grade 2 diastolic dysfunction. Mild to moderate MR. Peak PA pressure was 48 mmHg.    Patient presents with shortness of breath, cough, congestion-yellow, sore throat, chills/subjective fever, which started about 2 days ago. She reports having some nausea today as well as off-and-on chest tightness. At its worst it was 5 out of 10 in intensity.  She also has some occasional hematochezia which she attributes to hemorrhoids.  The patient currently denies vomiting, orthopnea, dizziness, PND, abdominal pain,  melena, lower extremity edema. Her troponin POC is 1.21. EKG shows sinus tachycardia rate of 118 bpm.  Chest x-ray today shows mild CHF pattern superimposed on COPD/emphysema   Past Medical History  Diagnosis Date  . SPINAL STENOSIS   . LOW BACK PAIN     chronic, follows with Nsurg for ESI  . FRACTURE, PELVIS, RIGHT 12/2009  . HIP FRACTURE, LEFT 09/2009    s/p L THA  . ALLERGIC RHINITIS   . Carpal tunnel syndrome   . Hypertension   . HYPERLIPIDEMIA   . GERD   . Arthritis   . Osteoporosis   . Atrial fibrillation     no anticoag due to fall risk/age  . DEPRESSION   . CORONARY ARTERY DISEASE 2004    a.  LHC (01/2003):  3 v CAD => s/p CABG.;   b.  Myoview (02/2013):  Anterior scar; no ischemia; EF 49%.    . Systolic congestive heart failure   . 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 Echo  (02/2013):  EF 25-30%.;   b.  Echo (04/04/13):  Mild focal basal septal hypertrophy, EF 60-65%, no RWMA, Gr 2 DD, mild to mod MAC, mild bileaflet MVP, mild to mod MR, mild to mod LAE, mild RAE, PASP 48.  [name] H/O Doppler ultrasound     Renal Artery [place] (09/2010):  No RAS.  . Carotid artery occlusion     Carotid US (05/2010):  RICA 70-99%; LICA 0-49%  . Anemia     Past Surgical History  Procedure Laterality Date  . (l) hip hemiarthroplasty  09/2009  . Appendectomy  1940  . Eye surgery      both cataracts  . Coronary artery bypass graft  2004  . Carpal tunnel release  2004    right and left-dsc  . Ulnar nerve transposition  2009    lt   . Vein ligation      legs  . Colonoscopy    . I&d extremity Right 12/26/2012    Procedure: IRRIGATION AND DEBRIDEMENT OF RIGHT LEG, SURGICAL PREP PLACEMENT OF A CELL AND VAC   ;  Surgeon: Wayland Denis, DO;  Location: [facility];  Service: Plastics;  Laterality: Right;    Family History  Problem Relation Age of Onset  . Arthritis Mother   . Breast cancer Mother   . Heart disease Mother   .  Hypertension Mother   . Arthritis Father   . Heart disease Father   . Hypertension Father   . Arthritis Maternal Grandmother   . Arthritis Maternal Grandfather   . Arthritis Paternal Grandmother   . Arthritis Paternal Grandfather   . Breast cancer Daughter     Social History:  reports that she quit smoking about 35 years ago. She has never used smokeless tobacco. She reports that she does not drink alcohol or use illicit drugs.  Allergies:  Allergies  Allergen Reactions  . Amoxicillin Nausea Only    Upset stomach with high dose  . Sporanox [Itraconazole] Hives  . Sulfa Antibiotics Nausea Only    Medications:  Prior to Admission medications   Medication Sig Start Date End Date Taking? Authorizing Provider  ALPRAZolam Prudy Feeler) 0.25 MG tablet TAKE ONE TABLET AT BEDTIME AS NEEDED ONLY FOR SLEEP OR ANXIETY 03/17/14   Corwin Levins, MD  Ascorbic  Acid (VITAMIN C) 500 MG tablet Take 1,000 mg by mouth daily.     Historical Provider, MD  aspirin 81 MG tablet Take 81 mg by mouth every morning.     Historical Provider, MD  Cholecalciferol (VITAMIN D3) 1000 UNITS CAPS Take by mouth daily.      Historical Provider, MD  DOK 100 MG capsule TAKE ONE CAPSULE TWICE A DAY 08/29/13   Newt Lukes, MD  fentaNYL (DURAGESIC) 50 MCG/HR Place 1 patch (50 mcg total) onto the skin every 3 (three) days. 06/01/14 05/30/15  Newt Lukes, MD  ferrous sulfate 325 (65 FE) MG tablet Take 325 mg by mouth daily with breakfast.    Historical Provider, MD  furosemide (LASIX) 20 MG tablet 1 TABLET DAILY 09/03/13   Beatrice Lecher, PA-C  HYDROcodone-acetaminophen (NORCO/VICODIN) 5-325 MG per tablet Take 1 tablet by mouth every 6 (six) hours as needed. 06/01/14   Newt Lukes, MD  LYRICA 50 MG capsule TAKE ONE CAPSULE THREE TIMES DAILY 12/02/13   Newt Lukes, MD  metoprolol tartrate (LOPRESSOR) 25 MG tablet TAKE 1/2 TABLET TWICE DAILY 04/29/14   Lewayne Bunting, MD  Multiple Vitamin (MULTIVITAMIN WITH MINERALS) TABS Take 1 tablet by mouth daily.    Historical Provider, MD  nitroGLYCERIN (NITROSTAT) 0.4 MG SL tablet Place 1 tablet (0.4 mg total) under the tongue every 5 (five) minutes as needed for chest pain (upt o 3 doses). 02/12/13   Hillis Range, MD  Omega-3 Fatty Acids (FISH OIL) 1000 MG CAPS Take by mouth daily.     Historical Provider, MD  pantoprazole (PROTONIX) 40 MG tablet TAKE ONE TABLET BY MOUTH ONCE DAILY 04/11/14   Lewayne Bunting, MD  polyethylene glycol (MIRALAX / GLYCOLAX) packet Take 17 g by mouth every morning.    Historical Provider, MD  potassium chloride SA (K-DUR,KLOR-CON) 20 MEQ tablet TAKE ONE TABLET DAILY AS NEEDED 04/28/14   Newt Lukes, MD  pravastatin (PRAVACHOL) 40 MG tablet Take 1 tablet (40 mg total) by mouth every evening. 12/11/13   Lewayne Bunting, MD  Probiotic Product (PROBIOTIC DAILY PO) Take 1 each by mouth daily  as needed (for GI tract).    Historical Provider, MD  simvastatin (ZOCOR) 40 MG tablet TAKE ONE TABLET AT BEDTIME 04/10/14   Newt Lukes, MD  venlafaxine XR (EFFEXOR-XR) 75 MG 24 hr capsule TAKE ONE CAPSULE EACH DAY 12/15/13   Newt Lukes, MD  zolpidem (AMBIEN) 5 MG tablet TAKE ONE TABLET AT BEDTIME AS NEEDED 03/30/14   Raenette Rover  Felicity CoyerLeschber, MD     Results for orders placed or performed during the hospital encounter of 06/09/14 (from the past 48 hour(s))  CBC with Differential     Status: Abnormal   Collection Time: 06/09/14  4:55 PM  Result Value Ref Range   WBC 13.7 (H) 4.0 - 10.5 K/uL   RBC 4.50 3.87 - 5.11 MIL/uL   Hemoglobin 13.4 12.0 - 15.0 g/dL   HCT 16.142.6 09.636.0 - 04.546.0 %   MCV 94.7 78.0 - 100.0 fL   MCH 29.8 26.0 - 34.0 pg   MCHC 31.5 30.0 - 36.0 g/dL   RDW 40.913.8 81.111.5 - 91.415.5 %   Platelets 176 150 - 400 K/uL   Neutrophils Relative % 83 (H) 43 - 77 %   Neutro Abs 11.3 (H) 1.7 - 7.7 K/uL   Lymphocytes Relative 7 (L) 12 - 46 %   Lymphs Abs 1.0 0.7 - 4.0 K/uL   Monocytes Relative 10 3 - 12 %   Monocytes Absolute 1.3 (H) 0.1 - 1.0 K/uL   Eosinophils Relative 0 0 - 5 %   Eosinophils Absolute 0.0 0.0 - 0.7 K/uL   Basophils Relative 0 0 - 1 %   Basophils Absolute 0.1 0.0 - 0.1 K/uL  I-stat troponin, ED     Status: Abnormal   Collection Time: 06/09/14  5:19 PM  Result Value Ref Range   Troponin i, poc 1.21 (HH) 0.00 - 0.08 ng/mL   Comment NOTIFIED PHYSICIAN    Comment 3            Comment: Due to the release kinetics of cTnI, a negative result within the first hours of the onset of symptoms does not rule out myocardial infarction with certainty. If myocardial infarction is still suspected, repeat the test at appropriate intervals.     Dg Chest 2 View  06/09/2014   CLINICAL DATA:  Cough, shortness of breath, prior coronary bypass .  EXAM: CHEST - 2 VIEW  COMPARISON:  09/03/2013  FINDINGS: diffuse increased vascular and interstitial opacities throughout the lungs  compatible with edema. Heart is enlarged. Findings compatible with mild CHF superimposed on background COPD/ emphysema. Lungs are hyperinflated. Minor associated basilar atelectasis. No large effusion or pneumothorax. Trachea midline. Atherosclerosis of the aorta. Bones are osteopenic. Degenerative changes of the spine with an associated scoliosis. Chronic mid thoracic compression fracture deformity.  IMPRESSION: Mild CHF pattern superimposed on COPD/emphysema   Electronically Signed   By: Ruel Favorsrevor  Shick M.D.   On: 06/09/2014 18:10    Review of Systems  Constitutional: Positive for fever and chills.  HENT: Positive for congestion and sore throat.   Respiratory: Positive for cough and shortness of breath.   Cardiovascular: Positive for chest pain. Negative for orthopnea, leg swelling and PND.  Gastrointestinal: Positive for nausea and blood in stool. Negative for vomiting, abdominal pain and melena.  Genitourinary: Negative for hematuria.  Musculoskeletal: Positive for back pain (Chronic) and joint pain (chronic).  Neurological: Positive for weakness. Negative for dizziness.  All other systems reviewed and are negative.  Blood pressure 88/70, pulse 125, temperature 100.7 F (38.2 C), temperature source Oral, resp. rate 23, height 5\' 4"  (1.626 m), weight 120 lb (54.432 kg), SpO2 94 %. Physical Exam  Nursing note and vitals reviewed. Constitutional: She is oriented to person, place, and time. She appears well-developed. No distress.  Thin appearing  HENT:  Head: Normocephalic and atraumatic.  Eyes: EOM are normal. Pupils are equal, round, and reactive to light. No scleral  icterus.  Neck: Normal range of motion. Neck supple. JVD present.  Cardiovascular: Regular rhythm, S1 normal and S2 normal.  Tachycardia present.   No murmur heard. Pulses:      Radial pulses are 1+ on the right side, and 1+ on the left side.       Dorsalis pedis pulses are 2+ on the right side, and 2+ on the left side.    Respiratory: Effort normal. She has wheezes. She has rales (coarse crackles worse on the right).  GI: Soft. Bowel sounds are normal. She exhibits no distension. There is no tenderness.  Musculoskeletal: She exhibits no edema.  Lymphadenopathy:    She has no cervical adenopathy.  Neurological: She is alert and oriented to person, place, and time. She exhibits normal muscle tone.  Skin: Skin is warm and dry.  Psychiatric: She has a normal mood and affect.    Assessment/Plan:  Principal Problem:   Community acquired pneumonia Active Problems:   Acute respiratory failure   Acute on chronic diastolic heart failure   Essential hypertension   Chest pain   80 year old female with a history of coronary artery disease status post coronary artery bypass grafting in 2004, CHF, carotid stenosis, hypertension, hyperlipidemia, atrial fibrillation.  She is not on Coumadin given history of fall risk.  However, she is on aspirin and Plavix.  She presents with shortness of breath, cough, congestion-yellow, sore throat, chills/subjective fever, which started about 2 days ago.  BNP is elevated at 2199 which is higher than has been in the past. Her troponin is elevated at 1.21. She is denying any chest pain currently.  She was started on IV heparin.    We'll continue cycle troponin.  We'll see how she responds to the initial dose of IV Lasix and reevaluate prior to giving more.  Will add IV abx for PNA.  Blood cultures.  Check 2D echo.      Wilburt FinlayHAGER, BRYAN, PAC 06/09/2014, 6:35 PM    I have seen and examined the patient along with HAGER, BRYAN, PAC.  I have reviewed the chart, notes and new data.  I agree with PA's note.  Clinical scenario is most consistent with pneumonia (fever and chills, elevated WBC, rales localized to right base) leading to acute diastolic HF exacerbation (elevated JVD, BNP more than 2X above previous "baseline") in a nonagenarian with COPD as well as a history of CAD >11 years s/p  CABG. CXR suggests COPD, CHF, but I think there may possibly be a  RLL pneumonia. Mild cTropI likely a sign of demand ischemia rather than true acute coronary syndrome (ECG without changes from prior tracing, no angina), but it would not be a surprise if some of her coronary conduits have failed. Recommend antibiotics for community acquired pneumonia after cultures are drawn. She has already received diuretics in ED and I would probably not recommend more diuretics tonight. Monitor cardiac enzymes and check echo. No plan for invasive coronary evaluation. She desires DNR status, appropriate for her age.  Thurmon FairMihai Asucena Galer, MD, [facility] Hospital[facility]ACC [facility] [phone] 06/09/2014, 7:29 PM

## 2014-06-09 NOTE — ED Notes (Signed)
Pt comes from  [facility], pt complains of SOB and yellow sputum. Patient received 10 albuterol 125 soumederol, 25 Atrovent. Patient has 20 Lt forarm placed by EMS

## 2014-06-09 NOTE — ED Notes (Signed)
Patient finished Dual NED sats at 7686 without any O2 patient placed on 5L and SATS 93% EDP made aware and recommendation call RT for CAT.

## 2014-06-09 NOTE — ED Notes (Signed)
Spoke with RT advised will come and see.

## 2014-06-09 NOTE — H&P (Addendum)
[facility] History and Physical  Julia Black WUJ:[id number] DOB: 05/21/1919 DOA: 06/09/2014  Referring physician: ER physician. PCP: Rene PaciValerie Leschber, MD   Chief Complaint: Shortness of breath.  HPI: Julia Black is a 80 y.o. female with history of CAD status post CABG, diastolic CHF last EF measured in September 2014 was 60-65%, hyperlipidemia presents to the ER from the nursing home because of shortness of breath and productive cough. Patient states that she has been having productive cough for last 2 days and became short of breath today suddenly. Patient also was found to have fever chills. In the ER patient was found to be febrile with blood work showing leukocytosis and chest x-ray showing congestion. On exam patient does have left lower lung field crepitations. Patient's troponin was found to be elevated and on-call cardiologist was consulted. EKG was showing sinus tachycardia. Patient initially was given Lasix 1 dose for CHF. Cardiologist at this time feels that patient's decompensated CHF and elevated troponin probably secondary to patient's pneumonia causing demand ischemia and decompensated CHF. Patient otherwise denies any nausea vomiting abdominal pain. Patient denies any chest pain though she does have some chest tightness.   Review of Systems: As presented in the history of presenting illness, rest negative.  Past Medical History  Diagnosis Date  . SPINAL STENOSIS   . LOW BACK PAIN     chronic, follows with Nsurg for ESI  . FRACTURE, PELVIS, RIGHT 12/2009  . HIP FRACTURE, LEFT 09/2009    s/p L THA  . ALLERGIC RHINITIS   . Carpal tunnel syndrome   . Hypertension   . HYPERLIPIDEMIA   . GERD   . Arthritis   . Osteoporosis   . Atrial fibrillation     no anticoag due to fall risk/age  . DEPRESSION   . CORONARY ARTERY DISEASE 2004    a.  LHC (01/2003):  3 v CAD => s/p CABG.;   b.  Myoview (02/2013):  Anterior scar; no ischemia; EF 49%.    . Systolic congestive heart  failure   . 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 Echo (02/2013):  EF 25-30%.;   b.  Echo (04/04/13):  Mild focal basal septal hypertrophy, EF 60-65%, no RWMA, Gr 2 DD, mild to mod MAC, mild bileaflet MVP, mild to mod MR, mild to mod LAE, mild RAE, PASP 48.  [name]. H/O Doppler ultrasound     Renal Artery US (09/2010):  No RAS.  . Carotid artery occlusion     Carotid US (05/2010):  RICA 70-99%; LICA 0-49%  . Anemia    Past Surgical History  Procedure Laterality Date  . (l) hip hemiarthroplasty  09/2009  . Appendectomy  1940  . Eye surgery      both cataracts  . Coronary artery bypass graft  2004  . Carpal tunnel release  2004    right and left-dsc  . Ulnar nerve transposition  2009    lt   . Vein ligation      legs  . Colonoscopy    . I&d extremity Right 12/26/2012    Procedure: IRRIGATION AND DEBRIDEMENT OF RIGHT LEG, SURGICAL PREP PLACEMENT OF A CELL AND VAC   ;  Surgeon: Wayland Denislaire Sanger, DO;  Location: [facility];  Service: Plastics;  Laterality: Right;   Social History:  reports that she quit smoking about 35 years ago. She has never used smokeless tobacco. She reports that she does not drink alcohol or use illicit drugs.  Where does patient live nursing home. Can patient participate in ADLs? Yes.  Allergies  Allergen Reactions  . Amoxicillin Nausea Only    Upset stomach with high dose  . Sporanox [Itraconazole] Hives  . Sulfa Antibiotics Nausea Only    Family History:  Family History  Problem Relation Age of Onset  . Arthritis Mother   . Breast cancer Mother   . Heart disease Mother   . Hypertension Mother   . Arthritis Father   . Heart disease Father   . Hypertension Father   . Arthritis Maternal Grandmother   . Arthritis Maternal Grandfather   . Arthritis Paternal Grandmother   . Arthritis Paternal Grandfather   . Breast cancer Daughter       Prior to Admission medications   Medication Sig Start Date End Date Taking? Authorizing Provider   ALPRAZolam Prudy Feeler(XANAX) 0.25 MG tablet TAKE ONE TABLET AT BEDTIME AS NEEDED ONLY FOR SLEEP OR ANXIETY 03/17/14  Yes Corwin LevinsJames W John, MD  Ascorbic Acid (VITAMIN C) 500 MG tablet Take 1,000 mg by mouth daily.    Yes Historical Provider, MD  aspirin 81 MG tablet Take 81 mg by mouth at bedtime.    Yes Historical Provider, MD  Cholecalciferol (VITAMIN D3) 1000 UNITS CAPS Take 1 capsule by mouth daily.    Yes Historical Provider, MD  DOK 100 MG capsule TAKE ONE CAPSULE TWICE A DAY 08/29/13  Yes Newt LukesValerie A Leschber, MD  fentaNYL (DURAGESIC) 50 MCG/HR Place 1 patch (50 mcg total) onto the skin every 3 (three) days. 06/01/14 05/30/15 Yes Newt LukesValerie A Leschber, MD  ferrous sulfate 325 (65 FE) MG tablet Take 325 mg by mouth daily with breakfast.   Yes Historical Provider, MD  furosemide (LASIX) 20 MG tablet 1 TABLET DAILY Patient taking differently: Take 20 mg by mouth daily.  09/03/13  Yes Beatrice LecherScott T Weaver, PA-C  HYDROcodone-acetaminophen (NORCO/VICODIN) 5-325 MG per tablet Take 1 tablet by mouth every 6 (six) hours as needed. Patient taking differently: Take 1 tablet by mouth every 6 (six) hours as needed (pain).  06/01/14  Yes Newt LukesValerie A Leschber, MD  LYRICA 50 MG capsule TAKE ONE CAPSULE THREE TIMES DAILY 12/02/13  Yes Newt LukesValerie A Leschber, MD  metoprolol tartrate (LOPRESSOR) 25 MG tablet TAKE 1/2 TABLET TWICE DAILY 04/29/14  Yes Lewayne BuntingBrian S Crenshaw, MD  Multiple Vitamin (MULTIVITAMIN WITH MINERALS) TABS Take 1 tablet by mouth daily.   Yes Historical Provider, MD  nitroGLYCERIN (NITROSTAT) 0.4 MG SL tablet Place 1 tablet (0.4 mg total) under the tongue every 5 (five) minutes as needed for chest pain (upt o 3 doses). 02/12/13  Yes Hillis RangeJames Allred, MD  Omega-3 Fatty Acids (FISH OIL) 1000 MG CAPS Take 1 capsule by mouth daily.    Yes Historical Provider, MD  pantoprazole (PROTONIX) 40 MG tablet TAKE ONE TABLET BY MOUTH ONCE DAILY 04/11/14  Yes Lewayne BuntingBrian S Crenshaw, MD  polyethylene glycol (MIRALAX / GLYCOLAX) packet Take 17 g by mouth daily  as needed (constipation).    Yes Historical Provider, MD  potassium chloride SA (K-DUR,KLOR-CON) 20 MEQ tablet Take 20 mEq by mouth daily.   Yes Historical Provider, MD  pravastatin (PRAVACHOL) 40 MG tablet Take 1 tablet (40 mg total) by mouth every evening. 12/11/13  Yes Lewayne BuntingBrian S Crenshaw, MD  Probiotic Product (PROBIOTIC DAILY PO) Take 1 each by mouth daily as needed (for GI tract).   Yes Historical Provider, MD  venlafaxine XR (EFFEXOR-XR) 75 MG 24 hr capsule TAKE ONE CAPSULE EACH DAY 12/15/13  Yes Newt Lukes, MD  zolpidem (AMBIEN) 5 MG tablet Take 5 mg by mouth at bedtime as needed for sleep.   Yes Historical Provider, MD  potassium chloride SA (K-DUR,KLOR-CON) 20 MEQ tablet TAKE ONE TABLET DAILY AS NEEDED Patient not taking: Reported on 06/09/2014 04/28/14   Newt Lukes, MD  simvastatin (ZOCOR) 40 MG tablet TAKE ONE TABLET AT BEDTIME Patient not taking: Reported on 06/09/2014 04/10/14   Newt Lukes, MD  zolpidem (AMBIEN) 5 MG tablet TAKE ONE TABLET AT BEDTIME AS NEEDED Patient not taking: Reported on 06/09/2014 03/30/14   Newt Lukes, MD    Physical Exam: Filed Vitals:   06/09/14 2215 06/09/14 2225 06/09/14 2300 06/09/14 2315  BP: 85/55 85/55 82/48  84/50  Pulse:  105 98   Temp:      TempSrc:      Resp: 16 20    Height:      Weight:      SpO2:  100% 96%      General:  Well-developed and moderately nourished.  Eyes: Anicteric no pallor.  ENT: No discharge from the ears eyes nose or mouth.  Neck: No mass felt. No JVD appreciated.  Cardiovascular: S1 and S2 heard.  Respiratory: Left lower lung zone crepitations.  Abdomen: Soft nontender bowel sounds present.  Skin: No rash.  Musculoskeletal: No edema.  Psychiatric: Appears normal.  Neurologic: Alert awake oriented to time place and person. Moves all extremities.  Labs on Admission:  Basic Metabolic Panel:  Recent Labs Lab 06/09/14 1655  NA 139  K 4.6  CL 97  CO2 24  GLUCOSE 140*  BUN  24*  CREATININE 0.74  CALCIUM 9.0   Liver Function Tests:  Recent Labs Lab 06/09/14 1655  AST 47*  ALT 36*  ALKPHOS 206*  BILITOT 0.9  PROT 7.1  ALBUMIN 3.2*   No results for input(s): LIPASE, AMYLASE in the last 168 hours. No results for input(s): AMMONIA in the last 168 hours. CBC:  Recent Labs Lab 06/09/14 1655  WBC 13.7*  NEUTROABS 11.3*  HGB 13.4  HCT 42.6  MCV 94.7  PLT 176   Cardiac Enzymes: No results for input(s): CKTOTAL, CKMB, CKMBINDEX, TROPONINI in the last 168 hours.  BNP (last 3 results)  Recent Labs  09/03/13 1235 06/09/14 1711  PROBNP 929.0* 2199.0*   CBG: No results for input(s): GLUCAP in the last 168 hours.  Radiological Exams on Admission: Dg Chest 2 View  06/09/2014   CLINICAL DATA:  Cough, shortness of breath, prior coronary bypass .  EXAM: CHEST - 2 VIEW  COMPARISON:  09/03/2013  FINDINGS: diffuse increased vascular and interstitial opacities throughout the lungs compatible with edema. Heart is enlarged. Findings compatible with mild CHF superimposed on background COPD/ emphysema. Lungs are hyperinflated. Minor associated basilar atelectasis. No large effusion or pneumothorax. Trachea midline. Atherosclerosis of the aorta. Bones are osteopenic. Degenerative changes of the spine with an associated scoliosis. Chronic mid thoracic compression fracture deformity.  IMPRESSION: Mild CHF pattern superimposed on COPD/emphysema   Electronically Signed   By: Ruel Favors M.D.   On: 06/09/2014 18:10    EKG: Independently reviewed. Sinus tachycardia.  Assessment/Plan Principal Problem:   Community acquired pneumonia Active Problems:   Essential hypertension   Acute respiratory failure   Chest pain   Acute on chronic diastolic heart failure   Pneumonia   1. Pneumonia - on exam patient has crepitation in the left lower lung zone and patient is febrile with leukocytosis. Patient's symptoms  are concerning for pneumonia. Patient has been started  on Ambien antibiotics for the same. Follow blood cultures and check influenza PCR and check urine Legionella and strep antigen. 2. Demand ischemia versus non-ST elevation MI - cardiology has been already consulted. We will cycle cardiac markers. Patient has been started on heparin infusion. Aspirin. Continue statins. Further recommendations per cardiology. 3. Decompensated CHF diastolic last EF measured was in September 2014, 60-65% - since patient has been hypotensive patients had Lasix has been on hold. Closely follow respiratory status intake appointment metabolic panel. 4. Elevated LFTs - could be from #1 and also from hypertension. Follow LFTs closely. If patient remains febrile LFTs continue to increase many sonogram of the abdomen and other workup. 5. Hypertension - antihypertensives but a Lasix will be on hold because of hypotension. 6. CAD status post CABG - see #2. 7. Hyperlipidemia - continue statins.    Code Status: DO NOT RESUSCITATE.  Family Communication: Patient's family at the bedside.  Disposition Plan: Admit to inpatient.    KAKRAKANDY,ARSHAD N. [facility] Pager [phone].  If 7PM-7AM, please contact night-coverage [email or web address] Password [email or web address] 06/09/2014, 11:26 PM

## 2014-06-09 NOTE — ED Notes (Signed)
Notified Dr Lockwood of critical i stat result 

## 2014-06-09 NOTE — Progress Notes (Signed)
ANTICOAGULATION CONSULT NOTE - Initial Consult  Pharmacy Consult for heparin Indication: chest pain/ACS  Allergies  Allergen Reactions  . Amoxicillin Nausea Only    Upset stomach with high dose  . Sporanox [Itraconazole] Hives  . Sulfa Antibiotics Nausea Only    Patient Measurements: Height: 5\' 4"  (162.6 cm) Weight: 120 lb (54.432 kg) IBW/kg (Calculated) : 54.7 Heparin Dosing Weight: 54 kg  Vital Signs: Temp: 100.7 F (38.2 C) (12/01 1650) Temp Source: Oral (12/01 1650) BP: 98/56 mmHg (12/01 1656) Pulse Rate: 118 (12/01 1650)  Labs:  Recent Labs  06/09/14 1655  HGB 13.4  HCT 42.6  PLT 176    Estimated Creatinine Clearance: 37.7 mL/min (by C-G formula based on Cr of 0.7).   Medical History: Past Medical History  Diagnosis Date  . SPINAL STENOSIS   . LOW BACK PAIN     chronic, follows with Nsurg for ESI  . FRACTURE, PELVIS, RIGHT 12/2009  . HIP FRACTURE, LEFT 09/2009    s/p L THA  . ALLERGIC RHINITIS   . Carpal tunnel syndrome   . Hypertension   . HYPERLIPIDEMIA   . GERD   . Arthritis   . Osteoporosis   . Atrial fibrillation     no anticoag due to fall risk/age  . DEPRESSION   . CORONARY ARTERY DISEASE 2004    a.  LHC (01/2003):  3 v CAD => s/p CABG.;   b.  Myoview (02/2013):  Anterior scar; no ischemia; EF 49%.    . Systolic congestive heart failure   . 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 Echo (02/2013):  EF 25-30%.;   b.  Echo (04/04/13):  Mild focal basal septal hypertrophy, EF 60-65%, no RWMA, Gr 2 DD, mild to mod MAC, mild bileaflet MVP, mild to mod MR, mild to mod LAE, mild RAE, PASP 48.  [name]. H/O Doppler ultrasound     Renal Artery US (09/2010):  No RAS.  . Carotid artery occlusion     Carotid US (05/2010):  RICA 70-99%; LICA 0-49%  . Anemia     Assessment: Patient is a 80 y.o F presented to the ED with c/o SOB and now with elevated troponin.  To start heparin for ACS.  Goal of Therapy:  Heparin level 0.3-0.7 units/ml Monitor platelets  by anticoagulation protocol: Yes   Plan:  1) heparin 3000 units IV x1 bolus, then drip at 650 units/hr 2) check 8 hr heparin level  Josaiah Muhammed P 06/09/2014,5:44 PM

## 2014-06-09 NOTE — ED Provider Notes (Signed)
CSN: [id number]     Arrival date & time 06/09/14  1642 History   First MD Initiated Contact with Patient 06/09/14 1649     Chief Complaint  Patient presents with  . Shortness of Breath     (Consider location/radiation/quality/duration/timing/severity/associated sxs/prior Treatment) HPI Patient presents with multiple concerns.  Over the past week she has become progressively more ill. She complains of nausea, fatigue, cough, dyspnea. Onset was subtle.  Since onset symptoms have been progressive. No clear alleviating or exacerbating factors. No confusion, disorientation. Patient did have one brief episode of decreased interactivity, today, though she resolved to baseline spontaneously. HPI provided by the patient and her family members.    Past Medical History  Diagnosis Date  . SPINAL STENOSIS   . LOW BACK PAIN     chronic, follows with Nsurg for ESI  . FRACTURE, PELVIS, RIGHT 12/2009  . HIP FRACTURE, LEFT 09/2009    s/p L THA  . ALLERGIC RHINITIS   . Carpal tunnel syndrome   . Hypertension   . HYPERLIPIDEMIA   . GERD   . Arthritis   . Osteoporosis   . Atrial fibrillation     no anticoag due to fall risk/age  . DEPRESSION   . CORONARY ARTERY DISEASE 2004    a.  LHC (01/2003):  3 v CAD => s/p CABG.;   b.  Myoview (02/2013):  Anterior scar; no ischemia; EF 49%.    . Systolic congestive heart failure   . 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 Echo (02/2013):  EF 25-30%.;   b.  Echo (04/04/13):  Mild focal basal septal hypertrophy, EF 60-65%, no RWMA, Gr 2 DD, mild to mod MAC, mild bileaflet MVP, mild to mod MR, mild to mod LAE, mild RAE, PASP 48.  [name]. H/O Doppler ultrasound     Renal Artery US (09/2010):  No RAS.  . Carotid artery occlusion     Carotid US (05/2010):  RICA 70-99%; LICA 0-49%  . Anemia    Past Surgical History  Procedure Laterality Date  . (l) hip hemiarthroplasty  09/2009  . Appendectomy  1940  . Eye surgery      both cataracts  . Coronary artery  bypass graft  2004  . Carpal tunnel release  2004    right and left-dsc  . Ulnar nerve transposition  2009    lt   . Vein ligation      legs  . Colonoscopy    . I&d extremity Right 12/26/2012    Procedure: IRRIGATION AND DEBRIDEMENT OF RIGHT LEG, SURGICAL PREP PLACEMENT OF A CELL AND VAC   ;  Surgeon: Wayland Denislaire Sanger, DO;  Location: [facility];  Service: Plastics;  Laterality: Right;   Family History  Problem Relation Age of Onset  . Arthritis Mother   . Breast cancer Mother   . Heart disease Mother   . Hypertension Mother   . Arthritis Father   . Heart disease Father   . Hypertension Father   . Arthritis Maternal Grandmother   . Arthritis Maternal Grandfather   . Arthritis Paternal Grandmother   . Arthritis Paternal Grandfather   . Breast cancer Daughter    History  Substance Use Topics  . Smoking status: Former Smoker    Quit date: 07/10/1978  . Smokeless tobacco: Never Used     Comment: Lives alone in indep living facility @ [facility]. Active ADLS but does not drive dur to macular degen. supportive family members nearby  .  Alcohol Use: No   OB History    No data available     Review of Systems  Constitutional:       Per HPI, otherwise negative  HENT:       Per HPI, otherwise negative  Respiratory:       Per HPI, otherwise negative  Cardiovascular:       Per HPI, otherwise negative  Gastrointestinal: Negative for vomiting.  Endocrine:       Negative aside from HPI  Genitourinary:       Neg aside from HPI   Musculoskeletal:       Per HPI, otherwise negative  Skin: Negative.   Neurological: Negative for syncope.      Allergies  Amoxicillin; Sporanox; and Sulfa antibiotics  Home Medications   Prior to Admission medications   Medication Sig Start Date End Date Taking? Authorizing Provider  ALPRAZolam Prudy Feeler) 0.25 MG tablet TAKE ONE TABLET AT BEDTIME AS NEEDED ONLY FOR SLEEP OR ANXIETY 03/17/14  Yes Corwin Levins, MD  Ascorbic Acid (VITAMIN  C) 500 MG tablet Take 1,000 mg by mouth daily.    Yes Historical Provider, MD  aspirin 81 MG tablet Take 81 mg by mouth at bedtime.    Yes Historical Provider, MD  Cholecalciferol (VITAMIN D3) 1000 UNITS CAPS Take 1 capsule by mouth daily.    Yes Historical Provider, MD  DOK 100 MG capsule TAKE ONE CAPSULE TWICE A DAY 08/29/13  Yes Newt Lukes, MD  fentaNYL (DURAGESIC) 50 MCG/HR Place 1 patch (50 mcg total) onto the skin every 3 (three) days. 06/01/14 05/30/15 Yes Newt Lukes, MD  ferrous sulfate 325 (65 FE) MG tablet Take 325 mg by mouth daily with breakfast.   Yes Historical Provider, MD  furosemide (LASIX) 20 MG tablet 1 TABLET DAILY Patient taking differently: Take 20 mg by mouth daily.  09/03/13  Yes Beatrice Lecher, PA-C  HYDROcodone-acetaminophen (NORCO/VICODIN) 5-325 MG per tablet Take 1 tablet by mouth every 6 (six) hours as needed. Patient taking differently: Take 1 tablet by mouth every 6 (six) hours as needed (pain).  06/01/14  Yes Newt Lukes, MD  LYRICA 50 MG capsule TAKE ONE CAPSULE THREE TIMES DAILY 12/02/13  Yes Newt Lukes, MD  metoprolol tartrate (LOPRESSOR) 25 MG tablet TAKE 1/2 TABLET TWICE DAILY 04/29/14  Yes Lewayne Bunting, MD  Multiple Vitamin (MULTIVITAMIN WITH MINERALS) TABS Take 1 tablet by mouth daily.   Yes Historical Provider, MD  nitroGLYCERIN (NITROSTAT) 0.4 MG SL tablet Place 1 tablet (0.4 mg total) under the tongue every 5 (five) minutes as needed for chest pain (upt o 3 doses). 02/12/13  Yes Hillis Range, MD  Omega-3 Fatty Acids (FISH OIL) 1000 MG CAPS Take 1 capsule by mouth daily.    Yes Historical Provider, MD  pantoprazole (PROTONIX) 40 MG tablet TAKE ONE TABLET BY MOUTH ONCE DAILY 04/11/14  Yes Lewayne Bunting, MD  polyethylene glycol (MIRALAX / GLYCOLAX) packet Take 17 g by mouth daily as needed (constipation).    Yes Historical Provider, MD  potassium chloride SA (K-DUR,KLOR-CON) 20 MEQ tablet Take 20 mEq by mouth daily.   Yes  Historical Provider, MD  pravastatin (PRAVACHOL) 40 MG tablet Take 1 tablet (40 mg total) by mouth every evening. 12/11/13  Yes Lewayne Bunting, MD  Probiotic Product (PROBIOTIC DAILY PO) Take 1 each by mouth daily as needed (for GI tract).   Yes Historical Provider, MD  venlafaxine XR (EFFEXOR-XR) 75 MG 24 hr  capsule TAKE ONE CAPSULE EACH DAY 12/15/13  Yes Newt Lukes, MD  zolpidem (AMBIEN) 5 MG tablet Take 5 mg by mouth at bedtime as needed for sleep.   Yes Historical Provider, MD  potassium chloride SA (K-DUR,KLOR-CON) 20 MEQ tablet TAKE ONE TABLET DAILY AS NEEDED Patient not taking: Reported on 06/09/2014 04/28/14   Newt Lukes, MD  simvastatin (ZOCOR) 40 MG tablet TAKE ONE TABLET AT BEDTIME Patient not taking: Reported on 06/09/2014 04/10/14   Newt Lukes, MD  zolpidem (AMBIEN) 5 MG tablet TAKE ONE TABLET AT BEDTIME AS NEEDED Patient not taking: Reported on 06/09/2014 03/30/14   Newt Lukes, MD   BP 80/47 mmHg  Pulse 107  Temp(Src) 100.7 F (38.2 C) (Oral)  Resp 16  Ht 5\' 4"  (1.626 m)  Wt 120 lb (54.432 kg)  BMI 20.59 kg/m2  SpO2 98% Physical Exam  Constitutional: She is oriented to person, place, and time. She appears well-developed and well-nourished. No distress.  HENT:  Head: Normocephalic and atraumatic.  Eyes: Conjunctivae and EOM are normal.  Cardiovascular: Regular rhythm.  Tachycardia present.   Pulmonary/Chest: No stridor. Tachypnea noted. She has decreased breath sounds. She has wheezes.  Abdominal: She exhibits no distension. There is no tenderness. There is no rebound.  Musculoskeletal: She exhibits no edema.  Neurological: She is alert and oriented to person, place, and time. No cranial nerve deficit.  Skin: Skin is warm and dry.  Psychiatric: She has a normal mood and affect.  Nursing note and vitals reviewed.   ED Course  Procedures (including critical care time) Labs Review Labs Reviewed  COMPREHENSIVE METABOLIC PANEL - Abnormal;  Notable for the following:    Glucose, Bld 140 (*)    BUN 24 (*)    Albumin 3.2 (*)    AST 47 (*)    ALT 36 (*)    Alkaline Phosphatase 206 (*)    GFR calc non Af Amer 71 (*)    GFR calc Af Amer 82 (*)    Anion gap 18 (*)    All other components within normal limits  CBC WITH DIFFERENTIAL - Abnormal; Notable for the following:    WBC 13.7 (*)    Neutrophils Relative % 83 (*)    Neutro Abs 11.3 (*)    Lymphocytes Relative 7 (*)    Monocytes Absolute 1.3 (*)    All other components within normal limits  PRO B NATRIURETIC PEPTIDE - Abnormal; Notable for the following:    Pro B Natriuretic peptide (BNP) 2199.0 (*)    All other components within normal limits  I-STAT TROPOININ, ED - Abnormal; Notable for the following:    Troponin i, poc 1.21 (*)    All other components within normal limits  CULTURE, BLOOD (ROUTINE X 2)  CULTURE, BLOOD (ROUTINE X 2)  HEPARIN LEVEL (UNFRACTIONATED)  CBC  I-STAT CG4 LACTIC ACID, ED    Imaging Review Dg Chest 2 View  06/09/2014   CLINICAL DATA:  Cough, shortness of breath, prior coronary bypass .  EXAM: CHEST - 2 VIEW  COMPARISON:  09/03/2013  FINDINGS: diffuse increased vascular and interstitial opacities throughout the lungs compatible with edema. Heart is enlarged. Findings compatible with mild CHF superimposed on background COPD/ emphysema. Lungs are hyperinflated. Minor associated basilar atelectasis. No large effusion or pneumothorax. Trachea midline. Atherosclerosis of the aorta. Bones are osteopenic. Degenerative changes of the spine with an associated scoliosis. Chronic mid thoracic compression fracture deformity.  IMPRESSION: Mild CHF pattern superimposed on  COPD/emphysema   Electronically Signed   By: Ruel Favorsrevor  Shick M.D.   On: 06/09/2014 18:10     EKG Interpretation   Date/Time:  Tuesday June 09 2014 16:54:08 EST Ventricular Rate:  118 PR Interval:  194 QRS Duration: 83 QT Interval:  316 QTC Calculation: 443 R Axis:   66 Text  Interpretation:  Sinus tachycardia Multiple ventricular premature  complexes Consider left ventricular hypertrophy Minimal ST elevation,  lateral leads Sinus tachycardia Artifact Premature ventricular complexes  ST-t wave abnormality Abnormal ekg Confirmed by Gerhard MunchLOCKWOOD, Ivelise Castillo  MD  ([id number]) on 06/09/2014 4:59:35 PM     Pulse oximetry 99% with supplemental oxygen abnormal Monitor 121, sinus tach, abnormal   Update: Initial labs notable for elevated troponin. I discussed the patient's troponin elevation, EKG without cardiology colleagues. We agreed to start heparin. EKG essentially unchanged from prior.   Given the patient's fluid overloaded status, she will receive additional Lasix.   After the initial evaluation, while the patient received albuterol therapy, we confirmed DO NOT RESUSCITATE status.  Update: Patient appears calm, states that she feels better, is smiling, sitting up. Pressure remains low, but heart rate is decreasing, oxygen saturation is consistent.  Labs generally unremarkable beyond elevated BNP, troponin. Mild leukocytosis, fits clinical picture of respiratory infection, possible pneumonia. X-ray with opacifications, minimally, diffusely, but given the clinical scenario, patient will be treated with antibiotics for pneumonia  9:03 PM Patient tolerating therapy, including heparin, well MDM  Patient presents with concern of ongoing respiratory concerns, fatigue, weakness.  Patient's clinical scenario is most consistent with pneumonia, but the patient's labs are demonstration of elevated troponin, BNP consistent with fluid overload status, and likely metabolic demand ischemia. Patient improved with fluid resuscitation. Given the patient's elevated troponin, patient started on heparin, edition it to receiving fluids, antibiotics, Lasix. The patient's blood pressure remained low, she had no evidence for decompensation, and otherwise improved clinically. Patient required  admission for further evaluation and management.  CRITICAL CARE Performed by: Gerhard MunchLOCKWOOD, Breea Loncar Total critical care time: 35 Critical care time was exclusive of separately billable procedures and treating other patients. Critical care was necessary to treat or prevent imminent or life-threatening deterioration. Critical care was time spent personally by me on the following activities: development of treatment plan with patient and/or surrogate as well as nursing, discussions with consultants, evaluation of patient's response to treatment, examination of patient, obtaining history from patient or surrogate, ordering and performing treatments and interventions, ordering and review of laboratory studies, ordering and review of radiographic studies, pulse oximetry and re-evaluation of patient's condition.     Gerhard Munchobert Jaeshawn Silvio, MD 06/09/14 2104

## 2014-06-10 DIAGNOSIS — R739 Hyperglycemia, unspecified: Secondary | ICD-10-CM

## 2014-06-10 DIAGNOSIS — I959 Hypotension, unspecified: Secondary | ICD-10-CM

## 2014-06-10 DIAGNOSIS — I214 Non-ST elevation (NSTEMI) myocardial infarction: Secondary | ICD-10-CM | POA: Diagnosis present

## 2014-06-10 LAB — COMPREHENSIVE METABOLIC PANEL
ALBUMIN: 2.8 g/dL — AB (ref 3.5–5.2)
ALT: 34 U/L (ref 0–35)
AST: 53 U/L — AB (ref 0–37)
Alkaline Phosphatase: 166 U/L — ABNORMAL HIGH (ref 39–117)
Anion gap: 15 (ref 5–15)
BUN: 30 mg/dL — ABNORMAL HIGH (ref 6–23)
CALCIUM: 8.5 mg/dL (ref 8.4–10.5)
CHLORIDE: 102 meq/L (ref 96–112)
CO2: 23 mEq/L (ref 19–32)
CREATININE: 1.07 mg/dL (ref 0.50–1.10)
GFR calc Af Amer: 50 mL/min — ABNORMAL LOW (ref >90)
GFR calc non Af Amer: 43 mL/min — ABNORMAL LOW (ref >90)
Glucose, Bld: 234 mg/dL — ABNORMAL HIGH (ref 70–99)
Potassium: 4.4 mEq/L (ref 3.7–5.3)
Sodium: 140 mEq/L (ref 137–147)
TOTAL PROTEIN: 6.7 g/dL (ref 6.0–8.3)
Total Bilirubin: 0.3 mg/dL (ref 0.3–1.2)

## 2014-06-10 LAB — CBC WITH DIFFERENTIAL/PLATELET
Basophils Absolute: 0 10*3/uL (ref 0.0–0.1)
Basophils Relative: 0 % (ref 0–1)
EOS PCT: 0 % (ref 0–5)
Eosinophils Absolute: 0 10*3/uL (ref 0.0–0.7)
HEMATOCRIT: 38.2 % (ref 36.0–46.0)
HEMOGLOBIN: 12.3 g/dL (ref 12.0–15.0)
LYMPHS ABS: 0.5 10*3/uL — AB (ref 0.7–4.0)
LYMPHS PCT: 5 % — AB (ref 12–46)
MCH: 30.4 pg (ref 26.0–34.0)
MCHC: 32.2 g/dL (ref 30.0–36.0)
MCV: 94.3 fL (ref 78.0–100.0)
MONO ABS: 0.5 10*3/uL (ref 0.1–1.0)
Monocytes Relative: 4 % (ref 3–12)
Neutro Abs: 10.3 10*3/uL — ABNORMAL HIGH (ref 1.7–7.7)
Neutrophils Relative %: 91 % — ABNORMAL HIGH (ref 43–77)
Platelets: 140 10*3/uL — ABNORMAL LOW (ref 150–400)
RBC: 4.05 MIL/uL (ref 3.87–5.11)
RDW: 13.9 % (ref 11.5–15.5)
WBC: 11.3 10*3/uL — ABNORMAL HIGH (ref 4.0–10.5)

## 2014-06-10 LAB — HEPARIN LEVEL (UNFRACTIONATED)
HEPARIN UNFRACTIONATED: 0.2 [IU]/mL — AB (ref 0.30–0.70)
Heparin Unfractionated: 0.22 IU/mL — ABNORMAL LOW (ref 0.30–0.70)

## 2014-06-10 LAB — TROPONIN I
Troponin I: 5.11 ng/mL (ref <0.30)
Troponin I: 6.53 ng/mL (ref <0.30)
Troponin I: 6.06 ng/mL (ref <0.30)

## 2014-06-10 LAB — INFLUENZA PANEL BY PCR (TYPE A & B)
H1N1 flu by pcr: NOT DETECTED
Influenza A By PCR: NEGATIVE
Influenza B By PCR: NEGATIVE

## 2014-06-10 LAB — MRSA PCR SCREENING: MRSA BY PCR: NEGATIVE

## 2014-06-10 LAB — GLUCOSE, CAPILLARY
GLUCOSE-CAPILLARY: 97 mg/dL (ref 70–99)
GLUCOSE-CAPILLARY: 103 mg/dL — AB (ref 70–99)
Glucose-Capillary: 102 mg/dL — ABNORMAL HIGH (ref 70–99)

## 2014-06-10 LAB — STREP PNEUMONIAE URINARY ANTIGEN: STREP PNEUMO URINARY ANTIGEN: NEGATIVE

## 2014-06-10 MED ORDER — ASPIRIN EC 325 MG PO TBEC
325.0000 mg | DELAYED_RELEASE_TABLET | Freq: Every day | ORAL | Status: DC
  Administered 2014-06-10 – 2014-06-15 (×6): 325 mg via ORAL
  Filled 2014-06-10 (×6): qty 1

## 2014-06-10 MED ORDER — ONDANSETRON HCL 4 MG/2ML IJ SOLN: 4.0000 mg | Freq: Four times a day (QID) | INTRAMUSCULAR | Status: DC | PRN

## 2014-06-10 MED ORDER — ACETAMINOPHEN 650 MG RE SUPP: 650.0000 mg | Freq: Four times a day (QID) | RECTAL | Status: DC | PRN

## 2014-06-10 MED ORDER — INSULIN ASPART 100 UNIT/ML ~~LOC~~ SOLN: 0.0000 [IU] | Freq: Three times a day (TID) | SUBCUTANEOUS | Status: DC

## 2014-06-10 MED ORDER — KETOROLAC TROMETHAMINE 15 MG/ML IJ SOLN
15.0000 mg | Freq: Once | INTRAMUSCULAR | Status: DC
  Filled 2014-06-10: qty 1

## 2014-06-10 MED ORDER — FERROUS SULFATE 325 (65 FE) MG PO TABS
325.0000 mg | ORAL_TABLET | Freq: Every day | ORAL | Status: DC
  Administered 2014-06-10 – 2014-06-15 (×6): 325 mg via ORAL
  Filled 2014-06-10 (×8): qty 1

## 2014-06-10 MED ORDER — OMEGA-3-ACID ETHYL ESTERS 1 G PO CAPS
1000.0000 mg | ORAL_CAPSULE | Freq: Every day | ORAL | Status: DC
  Administered 2014-06-10 – 2014-06-15 (×6): 1000 mg via ORAL
  Filled 2014-06-10 (×6): qty 1

## 2014-06-10 MED ORDER — FENTANYL 50 MCG/HR TD PT72
50.0000 ug | MEDICATED_PATCH | TRANSDERMAL | Status: DC
  Administered 2014-06-11 – 2014-06-14 (×2): 50 ug via TRANSDERMAL
  Filled 2014-06-10 (×2): qty 1

## 2014-06-10 MED ORDER — HYDROCODONE-ACETAMINOPHEN 5-325 MG PO TABS
1.0000 | ORAL_TABLET | Freq: Once | ORAL | Status: AC
  Administered 2014-06-10: 1 via ORAL
  Filled 2014-06-10: qty 1

## 2014-06-10 MED ORDER — VENLAFAXINE HCL ER 75 MG PO CP24
75.0000 mg | ORAL_CAPSULE | Freq: Every day | ORAL | Status: DC
  Administered 2014-06-10 – 2014-06-15 (×6): 75 mg via ORAL
  Filled 2014-06-10 (×8): qty 1

## 2014-06-10 MED ORDER — PANTOPRAZOLE SODIUM 40 MG PO TBEC
40.0000 mg | DELAYED_RELEASE_TABLET | Freq: Every day | ORAL | Status: DC
  Administered 2014-06-10 – 2014-06-15 (×6): 40 mg via ORAL
  Filled 2014-06-10 (×6): qty 1

## 2014-06-10 MED ORDER — INSULIN ASPART 100 UNIT/ML ~~LOC~~ SOLN: 0.0000 [IU] | Freq: Every day | SUBCUTANEOUS | Status: DC

## 2014-06-10 MED ORDER — ACETAMINOPHEN 325 MG PO TABS
650.0000 mg | ORAL_TABLET | Freq: Four times a day (QID) | ORAL | Status: DC | PRN
  Administered 2014-06-13 – 2014-06-15 (×4): 650 mg via ORAL
  Filled 2014-06-10 (×4): qty 2

## 2014-06-10 MED ORDER — POLYETHYLENE GLYCOL 3350 17 G PO PACK
17.0000 g | PACK | Freq: Every day | ORAL | Status: DC | PRN
  Administered 2014-06-14 – 2014-06-15 (×2): 17 g via ORAL
  Filled 2014-06-10 (×7): qty 1

## 2014-06-10 MED ORDER — SODIUM CHLORIDE 0.9 % IV SOLN
INTRAVENOUS | Status: DC
  Administered 2014-06-10: 01:00:00 via INTRAVENOUS

## 2014-06-10 MED ORDER — PREGABALIN 25 MG PO CAPS
50.0000 mg | ORAL_CAPSULE | Freq: Every day | ORAL | Status: DC
  Administered 2014-06-10 – 2014-06-15 (×6): 50 mg via ORAL
  Filled 2014-06-10 (×6): qty 2

## 2014-06-10 MED ORDER — NITROGLYCERIN 0.4 MG SL SUBL
0.4000 mg | SUBLINGUAL_TABLET | SUBLINGUAL | Status: DC | PRN
  Filled 2014-06-10: qty 1

## 2014-06-10 MED ORDER — ALPRAZOLAM 0.25 MG PO TABS: 0.2500 mg | ORAL_TABLET | Freq: Every evening | ORAL | Status: DC | PRN

## 2014-06-10 MED ORDER — ONDANSETRON HCL 4 MG PO TABS: 4.0000 mg | ORAL_TABLET | Freq: Four times a day (QID) | ORAL | Status: DC | PRN

## 2014-06-10 MED ORDER — PRAVASTATIN SODIUM 40 MG PO TABS
40.0000 mg | ORAL_TABLET | Freq: Every evening | ORAL | Status: DC
  Administered 2014-06-10: 40 mg via ORAL
  Filled 2014-06-10 (×2): qty 1

## 2014-06-10 MED ORDER — ADULT MULTIVITAMIN W/MINERALS CH
1.0000 | ORAL_TABLET | Freq: Every day | ORAL | Status: DC
  Administered 2014-06-10 – 2014-06-15 (×6): 1 via ORAL
  Filled 2014-06-10 (×6): qty 1

## 2014-06-10 MED ORDER — METOPROLOL TARTRATE 12.5 MG HALF TABLET
12.5000 mg | ORAL_TABLET | Freq: Two times a day (BID) | ORAL | Status: DC
  Administered 2014-06-10 (×2): 12.5 mg via ORAL
  Filled 2014-06-10 (×3): qty 1

## 2014-06-10 MED ORDER — ZOLPIDEM TARTRATE 5 MG PO TABS
5.0000 mg | ORAL_TABLET | Freq: Every evening | ORAL | Status: DC | PRN
  Administered 2014-06-12 – 2014-06-14 (×4): 5 mg via ORAL
  Filled 2014-06-10 (×4): qty 1

## 2014-06-10 MED ORDER — HYDROCODONE-ACETAMINOPHEN 5-325 MG PO TABS
1.0000 | ORAL_TABLET | Freq: Four times a day (QID) | ORAL | Status: DC | PRN
  Administered 2014-06-10 – 2014-06-14 (×8): 1 via ORAL
  Filled 2014-06-10 (×8): qty 1

## 2014-06-10 MED ORDER — CYCLOBENZAPRINE HCL 5 MG PO TABS
5.0000 mg | ORAL_TABLET | Freq: Once | ORAL | Status: AC
  Administered 2014-06-10: 5 mg via ORAL
  Filled 2014-06-10: qty 1

## 2014-06-10 MED ORDER — DEXTROSE 5 % IV SOLN: 500.0000 mg | INTRAVENOUS | Status: DC

## 2014-06-10 NOTE — Progress Notes (Signed)
ANTICOAGULATION CONSULT NOTE  Pharmacy Consult for heparin Indication: chest pain/ACS  Allergies  Allergen Reactions  . Amoxicillin Nausea Only    Upset stomach with high dose  . Sporanox [Itraconazole] Hives  . Sulfa Antibiotics Nausea Only    Patient Measurements: Height: 5\' 4"  (162.6 cm) Weight: 114 lb 3.2 oz (51.8 kg) IBW/kg (Calculated) : 54.7 Heparin Dosing Weight: 54 kg  Vital Signs: Temp: 98.3 F (36.8 C) (12/02 1200) Temp Source: Oral (12/02 1200) BP: 88/51 mmHg (12/02 1200) Pulse Rate: 72 (12/02 1200)  Labs:  Recent Labs  06/09/14 1655 06/10/14 0020 06/10/14 0337 06/10/14 0905 06/10/14 1234 06/10/14 1522  HGB 13.4  --  12.3  --   --   --   HCT 42.6  --  38.2  --   --   --   PLT 176  --  140*  --   --   --   HEPARINUNFRC  --   --  0.20*  --   --  0.22*  CREATININE 0.74  --  1.07  --   --   --   TROPONINI  --  5.11*  --  6.53* 6.06*  --     Estimated Creatinine Clearance: 26.9 mL/min (by C-G formula based on Cr of 1.07).  Assessment: 80 y.o. female with NSTEMI for heparin. Pt remains subtherapeutic at 0.22 on heparin 800 units/hr. Hgb is normal and Plt slightly low at 140 and there is no noted bleeding.   Goal of Therapy:  Heparin level 0.3-0.7 units/ml Monitor platelets by anticoagulation protocol: Yes   Plan:  Increase heparin to 900 units/hr  Check heparin level in 8 hours Daily CBC/HL  Arlean Hoppingorey M. Newman PiesBall, PharmD Clinical Pharmacist Pager [phone] 06/10/2014,4:03 PM

## 2014-06-10 NOTE — Progress Notes (Addendum)
PROGRESS NOTE  Julia Black UUV:[id number] DOB: 01/21/1919 DOA: 06/09/2014 PCP: Rene PaciValerie Leschber, MD  Assessment/Plan: Pneumonia - -antibiotics  -Follow blood cultures -check influenza PCR  -check urine Legionella and strep antigen.  Demand ischemia versus non-ST elevation MI -  -cardiology consulted.  -cycle cardiac markers.  -heparin infusion -Aspirin -statins  Decompensated CHF diastolic last EF measured was in September 2014, 60-65% -  -patient has been hypotensive  -Lasix on hold  Elevated LFTs - could be from hypotension. Follow LFTs closely. If patient remains febrile LFTs continue to increase many sonogram of the abdomen and other workup.  Hypertension - -patient is currently hypotensive  CAD status post CABG - see #2.  Hyperlipidemia - continue statins  Hyperglycemia- SSI  Acute rep failure -wean O2 as tolerated Code Status: DNR Family Communication:  Disposition Plan:    Consultants:  cardiology  Procedures:      HPI/Subjective: Feeling better this AM Had to be cathed x 1 last PM  Objective: Filed Vitals:   06/10/14 0802  BP: 89/66  Pulse:   Temp: 97.8 F (36.6 C)  Resp:     Intake/Output Summary (Last 24 hours) at 06/10/14 0817 Last data filed at 06/10/14 0600  Gross per 24 hour  Intake   68.5 ml  Output    400 ml  Net -331.5 ml   Filed Weights   06/09/14 1650 06/10/14 0030  Weight: 54.432 kg (120 lb) 51.8 kg (114 lb 3.2 oz)    Exam:   General:  A+Ox3, NAD  Cardiovascular: rrr  Respiratory: coarse breath sounds- strong cough  Abdomen: +Bs, soft  Musculoskeletal: no edema  Data Reviewed: Basic Metabolic Panel:  Recent Labs Lab 06/09/14 1655 06/10/14 0337  NA 139 140  K 4.6 4.4  CL 97 102  CO2 24 23  GLUCOSE 140* 234*  BUN 24* 30*  CREATININE 0.74 1.07  CALCIUM 9.0 8.5   Liver Function Tests:  Recent Labs Lab 06/09/14 1655 06/10/14 0337  AST 47* 53*  ALT 36* 34  ALKPHOS 206* 166*  BILITOT 0.9 0.3    PROT 7.1 6.7  ALBUMIN 3.2* 2.8*   No results for input(s): LIPASE, AMYLASE in the last 168 hours. No results for input(s): AMMONIA in the last 168 hours. CBC:  Recent Labs Lab 06/09/14 1655 06/10/14 0337  WBC 13.7* 11.3*  NEUTROABS 11.3* 10.3*  HGB 13.4 12.3  HCT 42.6 38.2  MCV 94.7 94.3  PLT 176 140*   Cardiac Enzymes:  Recent Labs Lab 06/10/14 0020  TROPONINI 5.11*   BNP (last 3 results)  Recent Labs  09/03/13 1235 06/09/14 1711  PROBNP 929.0* 2199.0*   CBG: No results for input(s): GLUCAP in the last 168 hours.  Recent Results (from the past 240 hour(s))  MRSA PCR Screening     Status: None   Collection Time: 06/10/14 12:39 AM  Result Value Ref Range Status   MRSA by PCR NEGATIVE NEGATIVE Final    Comment:        The GeneXpert MRSA Assay (FDA approved for NASAL specimens only), is one component of a comprehensive MRSA colonization surveillance program. It is not intended to diagnose MRSA infection nor to guide or monitor treatment for MRSA infections.      Studies: Dg Chest 2 View  06/09/2014   CLINICAL DATA:  Cough, shortness of breath, prior coronary bypass .  EXAM: CHEST - 2 VIEW  COMPARISON:  09/03/2013  FINDINGS: diffuse increased vascular and interstitial opacities throughout the lungs compatible with  edema. Heart is enlarged. Findings compatible with mild CHF superimposed on background COPD/ emphysema. Lungs are hyperinflated. Minor associated basilar atelectasis. No large effusion or pneumothorax. Trachea midline. Atherosclerosis of the aorta. Bones are osteopenic. Degenerative changes of the spine with an associated scoliosis. Chronic mid thoracic compression fracture deformity.  IMPRESSION: Mild CHF pattern superimposed on COPD/emphysema   Electronically Signed   By: Ruel Favorsrevor  Shick M.D.   On: 06/09/2014 18:10    Scheduled Meds: . aspirin EC  325 mg Oral Daily  . azithromycin  500 mg Intravenous Q24H  . cefTRIAXone (ROCEPHIN)  IV  1 g  Intravenous Q24H  . [START ON 06/11/2014] fentaNYL  50 mcg Transdermal Q72H  . ferrous sulfate  325 mg Oral Q breakfast  . metoprolol tartrate  12.5 mg Oral BID  . multivitamin with minerals  1 tablet Oral Daily  . omega-3 acid ethyl esters  1,000 mg Oral Daily  . pantoprazole  40 mg Oral Daily  . pravastatin  40 mg Oral QPM  . pregabalin  50 mg Oral Daily  . venlafaxine XR  75 mg Oral Q breakfast   Continuous Infusions: . sodium chloride 10 mL/hr at 06/10/14 0045  . heparin 650 Units/hr (06/09/14 1838)   Antibiotics Given (last 72 hours)    None      Principal Problem:   Community acquired pneumonia Active Problems:   Essential hypertension   Acute respiratory failure   Chest pain   Acute on chronic diastolic heart failure   Pneumonia    Time spent: 35  min    Sherard Sutch  [facility] Pager [phone] If 7PM-7AM, please contact night-coverage at [email or web address], password [email or web address] 06/10/2014, 8:17 AM  LOS: 1 day

## 2014-06-10 NOTE — Progress Notes (Signed)
    Subjective:  Denies CP; dyspnea improving   Objective:  Filed Vitals:   06/09/14 2315 06/09/14 2330 06/10/14 0030 06/10/14 0802  BP: 84/50 89/50 92/67  89/66  Pulse:  96 101   Temp:   98.3 F (36.8 C) 97.8 F (36.6 C)  TempSrc:   Oral Oral  Resp:   22   Height:   5\' 4"  (1.626 m)   Weight:   114 lb 3.2 oz (51.8 kg)   SpO2:  96% 94%     Intake/Output from previous day:  Intake/Output Summary (Last 24 hours) at 06/10/14 0944 Last data filed at 06/10/14 0600  Gross per 24 hour  Intake   68.5 ml  Output    400 ml  Net -331.5 ml    Physical Exam: Physical exam: Well-developed frail in no acute distress.  Skin is warm and dry.  HEENT is normal.  Neck is supple.  Chest with basilar crackles Cardiovascular exam is regular rate and rhythm.  Abdominal exam nontender or distended. No masses palpated. Extremities show no edema. neuro grossly intact    Lab Results: Basic Metabolic Panel:  Recent Labs  41/32/4410/07/24 1655 06/10/14 0337  NA 139 140  K 4.6 4.4  CL 97 102  CO2 24 23  GLUCOSE 140* 234*  BUN 24* 30*  CREATININE 0.74 1.07  CALCIUM 9.0 8.5   CBC:  Recent Labs  06/09/14 1655 06/10/14 0337  WBC 13.7* 11.3*  NEUTROABS 11.3* 10.3*  HGB 13.4 12.3  HCT 42.6 38.2  MCV 94.7 94.3  PLT 176 140*   Cardiac Enzymes:  Recent Labs  06/10/14 0020  TROPONINI 5.11*     Assessment/Plan:  1 non-ST elevation myocardial infarction-this is potentially related to demand ischemia from pneumonia. Given her age we will not pursue further ischemia evaluation and instead treat medically. Note she is a no CODE BLUE. Continue aspirin, heparin and statin. Her blood pressure is decreased. Discontinue metoprolol. 2 pneumonia-continue antibiotics. 3 history of atrial fibrillation-patient in sinus rhythm at present. 4 chronic diastolic congestive heart failure-blood pressure is borderline. I will hold Lasix for now.  Olga MillersBrian Crenshaw 06/10/2014, 9:44 AM

## 2014-06-10 NOTE — Progress Notes (Signed)
Pt OOB in chair with improved appetite and stronger cough. Strict I & O with intermittant bladder scanning (see flow sheet). Patient made 25 cc urine on shift. Will continue to monitor closely

## 2014-06-10 NOTE — Progress Notes (Signed)
CRITICAL VALUE ALERT  Critical value received:  Troponin 5.11  Date of notification:  06/10/2014   Time of notification:  0317  Critical value read back: yes  Nurse who received alert:  Birdena CrandallAmanda Anisa Leanos RN  MD notified (1st page):    Time of first page:  0319  MD notified (2nd page):  Time of second page:  Responding MD:  Attending [email or web address] and consulting cardiologist notified  Time MD responded:  [phone], [phone]

## 2014-06-10 NOTE — Progress Notes (Signed)
ANTICOAGULATION CONSULT NOTE  Pharmacy Consult for heparin Indication: chest pain/ACS  Allergies  Allergen Reactions  . Amoxicillin Nausea Only    Upset stomach with high dose  . Sporanox [Itraconazole] Hives  . Sulfa Antibiotics Nausea Only    Patient Measurements: Height: 5\' 4"  (162.6 cm) Weight: 114 lb 3.2 oz (51.8 kg) IBW/kg (Calculated) : 54.7 Heparin Dosing Weight: 54 kg  Vital Signs: Temp: 98.3 F (36.8 C) (12/02 0030) Temp Source: Oral (12/02 0030) BP: 92/67 mmHg (12/02 0030) Pulse Rate: 101 (12/02 0030)  Labs:  Recent Labs  06/09/14 1655 06/10/14 0020 06/10/14 0337  HGB 13.4  --  12.3  HCT 42.6  --  38.2  PLT 176  --  140*  HEPARINUNFRC  --   --  0.20*  CREATININE 0.74  --  1.07  TROPONINI  --  5.11*  --     Estimated Creatinine Clearance: 26.9 mL/min (by C-G formula based on Cr of 1.07).  Assessment: 80 y.o. female with chest pain for heparin   Goal of Therapy:  Heparin level 0.3-0.7 units/ml Monitor platelets by anticoagulation protocol: Yes   Plan:  Increase Heparin 800 units/hr Check heparin level in 8 hours.   Eddie Candlebbott, Julia Black 06/10/2014,6:27 AM

## 2014-06-10 NOTE — Plan of Care (Signed)
Problem: Phase I Progression Outcomes Goal: Dyspnea controlled at rest (HF) Outcome: Completed/Met Date Met:  06/10/14 Goal: EF % per last Echo/documented,Core Reminder form on chart Outcome: Completed/Met Date Met:  06/10/14 Goal: Up in chair, BRP Outcome: Completed/Met Date Met:  06/10/14 Goal: Voiding-avoid urinary catheter unless indicated Outcome: Completed/Met Date Met:  06/10/14

## 2014-06-10 NOTE — Care Management Note (Addendum)
    Page 1 of 1   06/15/2014     8:11:02 PM CARE MANAGEMENT NOTE 06/15/2014  Patient:  Julia Black,Julia Black   Account Number:  [id number]  Date Initiated:  06/10/2014  Documentation initiated by:  MAYO,HENRIETTA  Subjective/Objective Assessment:   dx PNA/diastolic failure; lives @ [facility]    PCP  Rene PaciValerie Leschber     Action/Plan:   Anticipated DC Date:  06/15/2014   Anticipated DC Plan:  HOME W HOME HEALTH SERVICES      DC Planning Services  CM consult      [facility]   Choice offered to / List presented to:          HH arranged  HH-2 PT      HH agency  OTHER - SEE NOTE   Status of service:  Completed, signed off Medicare Important Message given?  YES (If response is "NO", the following Medicare IM given date fields will be blank) Date Medicare IM given:  06/15/2014 Medicare IM given by:  Letha CapeAYLOR,Esteven Overfelt Date Additional Medicare IM given:   Additional Medicare IM given by:    Discharge Disposition:  HOME W HOME HEALTH SERVICES  Per UR Regulation:  Reviewed for med. necessity/level of care/duration of stay  If discussed at Long Length of Stay Meetings, dates discussed:    Comments:  06/15/14 Letha Capeeborah Mazelle Huebert RN ,BSN [phone] patient for dc to [facility] with outp pt physical therapy with [facility] at her facility. [facility] also put in patient's packet.

## 2014-06-11 LAB — COMPREHENSIVE METABOLIC PANEL
ALT: 39 U/L — AB (ref 0–35)
AST: 65 U/L — AB (ref 0–37)
Albumin: 2.5 g/dL — ABNORMAL LOW (ref 3.5–5.2)
Alkaline Phosphatase: 167 U/L — ABNORMAL HIGH (ref 39–117)
Anion gap: 12 (ref 5–15)
BUN: 32 mg/dL — ABNORMAL HIGH (ref 6–23)
CALCIUM: 8.7 mg/dL (ref 8.4–10.5)
CO2: 24 meq/L (ref 19–32)
Chloride: 100 mEq/L (ref 96–112)
Creatinine, Ser: 0.78 mg/dL (ref 0.50–1.10)
GFR calc Af Amer: 81 mL/min — ABNORMAL LOW (ref >90)
GFR, EST NON AFRICAN AMERICAN: 70 mL/min — AB (ref >90)
Glucose, Bld: 85 mg/dL (ref 70–99)
Potassium: 4.9 mEq/L (ref 3.7–5.3)
Sodium: 136 mEq/L — ABNORMAL LOW (ref 137–147)
TOTAL PROTEIN: 6 g/dL (ref 6.0–8.3)
Total Bilirubin: 0.3 mg/dL (ref 0.3–1.2)

## 2014-06-11 LAB — CBC
HCT: 37.1 % (ref 36.0–46.0)
Hemoglobin: 11.7 g/dL — ABNORMAL LOW (ref 12.0–15.0)
MCH: 29.6 pg (ref 26.0–34.0)
MCHC: 31.5 g/dL (ref 30.0–36.0)
MCV: 93.9 fL (ref 78.0–100.0)
Platelets: 146 10*3/uL — ABNORMAL LOW (ref 150–400)
RBC: 3.95 MIL/uL (ref 3.87–5.11)
RDW: 13.8 % (ref 11.5–15.5)
WBC: 9.5 10*3/uL (ref 4.0–10.5)

## 2014-06-11 LAB — GLUCOSE, CAPILLARY
GLUCOSE-CAPILLARY: 63 mg/dL — AB (ref 70–99)
GLUCOSE-CAPILLARY: 52 mg/dL — AB (ref 70–99)

## 2014-06-11 LAB — HEPARIN LEVEL (UNFRACTIONATED)
Heparin Unfractionated: 0.24 IU/mL — ABNORMAL LOW (ref 0.30–0.70)
Heparin Unfractionated: 0.46 IU/mL (ref 0.30–0.70)

## 2014-06-11 MED ORDER — METOPROLOL TARTRATE 12.5 MG HALF TABLET
12.5000 mg | ORAL_TABLET | Freq: Two times a day (BID) | ORAL | Status: DC
  Administered 2014-06-11 – 2014-06-15 (×9): 12.5 mg via ORAL
  Filled 2014-06-11 (×10): qty 1

## 2014-06-11 MED ORDER — ALBUTEROL SULFATE (2.5 MG/3ML) 0.083% IN NEBU: 2.5000 mg | INHALATION_SOLUTION | RESPIRATORY_TRACT | Status: DC | PRN

## 2014-06-11 MED ORDER — GUAIFENESIN ER 600 MG PO TB12
600.0000 mg | ORAL_TABLET | Freq: Two times a day (BID) | ORAL | Status: DC
Start: 2014-06-11 — End: 2014-06-15
  Administered 2014-06-11 – 2014-06-15 (×9): 600 mg via ORAL
  Filled 2014-06-11 (×10): qty 1

## 2014-06-11 MED ORDER — LEVOFLOXACIN 750 MG PO TABS
750.0000 mg | ORAL_TABLET | ORAL | Status: DC
  Administered 2014-06-11 – 2014-06-13 (×2): 750 mg via ORAL
  Filled 2014-06-11 (×3): qty 1

## 2014-06-11 MED ORDER — AZITHROMYCIN 500 MG PO TABS
500.0000 mg | ORAL_TABLET | Freq: Every day | ORAL | Status: DC
  Filled 2014-06-11: qty 1

## 2014-06-11 NOTE — Progress Notes (Signed)
Called and gave report to the nurse on 5 west. Updated on pt history, current status, and plan of care. Pt is stable and able to transfer at this time.   Caralee AtesJason Meighan Treto, RN

## 2014-06-11 NOTE — Clinical Documentation Improvement (Signed)
Admission diagnosis pneumonia; presented with tachycardia, hypotension, and WBC 13.7.  Please clarify any additional diagnoses present on admission, if any, and document in your progress note and discharge summary.  Possible Clinical Conditions: -Sepsis (if present, please document if present on admission) -Other Clinical Condition -Unable to determine at present  Thank you, Doy MinceVangela Aleeha Boline, RN [phone] Clinical Documentation Specialist

## 2014-06-11 NOTE — Progress Notes (Signed)
Patient is wondering if she can have a different medication other than Levaquin/

## 2014-06-11 NOTE — Progress Notes (Addendum)
12:47 AM       HL 0.24  After increase to 900 units/hr. No bleeding noted. Increase to 1050 units/hr and move am HL to 0900  Janice CoffinEarl, Dres[place] Ament Jonathan

## 2014-06-11 NOTE — Progress Notes (Signed)
PROGRESS NOTE  Julia Black JXB:[id number] DOB: 08/20/1918 DOA: 06/09/2014 PCP: Rene PaciValerie Leschber, MD  Assessment/Plan: Pneumonia - -antibiotics  -Follow blood cultures -influenza PCR negative -urine Legionella pending and strep antigen negative. -mucinex added  Demand ischemia versus non-ST elevation MI -  -cardiology consulted.  -cycle cardiac markers.  -heparin infusion -Aspirin -statins  Decompensated CHF diastolic last EF measured was in September 2014, 60-65% -  -patient has been hypotensive  -Lasix on hold- BP slightly better  Elevated LFTs - Trend -d/c statins - could be congestion from heart failure  Hypertension - -patient is currently hypotensive -meds on hold  CAD status post CABG - see above  Hyperlipidemia - statins on hold due to liver enzymes  Hyperglycemia- SSI  Acute rep failure -wean O2 as tolerated  Leukocytosis -resolved  Code Status: DNR Family Communication: daughter at bedside (12/2) Disposition Plan:    Consultants:  cardiology  Procedures:      HPI/Subjective: Slept well last night Having trouble bringing up what she has coughed  Objective: Filed Vitals:   06/11/14 0736  BP: 120/87  Pulse: 88  Temp: 97.7 F (36.5 C)  Resp: 19    Intake/Output Summary (Last 24 hours) at 06/11/14 0810 Last data filed at 06/11/14 0751  Gross per 24 hour  Intake 1163.5 ml  Output    975 ml  Net  188.5 ml   Filed Weights   06/09/14 1650 06/10/14 0030  Weight: 54.432 kg (120 lb) 51.8 kg (114 lb 3.2 oz)    Exam:   General:  A+Ox3, NAD  Cardiovascular: rrr  Respiratory: rhonci b/l  Abdomen: +Bs, soft  Musculoskeletal: no edema  Data Reviewed: Basic Metabolic Panel:  Recent Labs Lab 06/09/14 1655 06/10/14 0337 06/11/14 0533  NA 139 140 136*  K 4.6 4.4 4.9  CL 97 102 100  CO2 24 23 24   GLUCOSE 140* 234* 85  BUN 24* 30* 32*  CREATININE 0.74 1.07 0.78  CALCIUM 9.0 8.5 8.7   Liver Function Tests:  Recent  Labs Lab 06/09/14 1655 06/10/14 0337 06/11/14 0533  AST 47* 53* 65*  ALT 36* 34 39*  ALKPHOS 206* 166* 167*  BILITOT 0.9 0.3 0.3  PROT 7.1 6.7 6.0  ALBUMIN 3.2* 2.8* 2.5*   No results for input(s): LIPASE, AMYLASE in the last 168 hours. No results for input(s): AMMONIA in the last 168 hours. CBC:  Recent Labs Lab 06/09/14 1655 06/10/14 0337 06/11/14 0533  WBC 13.7* 11.3* 9.5  NEUTROABS 11.3* 10.3*  --   HGB 13.4 12.3 11.7*  HCT 42.6 38.2 37.1  MCV 94.7 94.3 93.9  PLT 176 140* 146*   Cardiac Enzymes:  Recent Labs Lab 06/10/14 0020 06/10/14 0905 06/10/14 1234  TROPONINI 5.11* 6.53* 6.06*   BNP (last 3 results)  Recent Labs  09/03/13 1235 06/09/14 1711  PROBNP 929.0* 2199.0*   CBG:  Recent Labs Lab 06/10/14 1159 06/10/14 1607 06/10/14 2000  GLUCAP 97 103* 102*    Recent Results (from the past 240 hour(s))  MRSA PCR Screening     Status: None   Collection Time: 06/10/14 12:39 AM  Result Value Ref Range Status   MRSA by PCR NEGATIVE NEGATIVE Final    Comment:        The GeneXpert MRSA Assay (FDA approved for NASAL specimens only), is one component of a comprehensive MRSA colonization surveillance program. It is not intended to diagnose MRSA infection nor to guide or monitor treatment for MRSA infections.  Studies: Dg Chest 2 View  06/09/2014   CLINICAL DATA:  Cough, shortness of breath, prior coronary bypass .  EXAM: CHEST - 2 VIEW  COMPARISON:  09/03/2013  FINDINGS: diffuse increased vascular and interstitial opacities throughout the lungs compatible with edema. Heart is enlarged. Findings compatible with mild CHF superimposed on background COPD/ emphysema. Lungs are hyperinflated. Minor associated basilar atelectasis. No large effusion or pneumothorax. Trachea midline. Atherosclerosis of the aorta. Bones are osteopenic. Degenerative changes of the spine with an associated scoliosis. Chronic mid thoracic compression fracture deformity.   IMPRESSION: Mild CHF pattern superimposed on COPD/emphysema   Electronically Signed   By: Ruel Favorsrevor  Shick M.D.   On: 06/09/2014 18:10    Scheduled Meds: . aspirin EC  325 mg Oral Daily  . azithromycin  500 mg Intravenous Q24H  . cefTRIAXone (ROCEPHIN)  IV  1 g Intravenous Q24H  . fentaNYL  50 mcg Transdermal Q72H  . ferrous sulfate  325 mg Oral Q breakfast  . insulin aspart  0-5 Units Subcutaneous QHS  . insulin aspart  0-9 Units Subcutaneous TID WC  . ketorolac  15 mg Intravenous Once  . multivitamin with minerals  1 tablet Oral Daily  . omega-3 acid ethyl esters  1,000 mg Oral Daily  . pantoprazole  40 mg Oral Daily  . pravastatin  40 mg Oral QPM  . pregabalin  50 mg Oral Daily  . venlafaxine XR  75 mg Oral Q breakfast   Continuous Infusions: . sodium chloride 10 mL/hr at 06/10/14 0045  . heparin 1,050 Units/hr (06/11/14 0113)   Antibiotics Given (last 72 hours)    None      Principal Problem:   Community acquired pneumonia Active Problems:   Essential hypertension   Acute respiratory failure   Chest pain   Acute on chronic diastolic heart failure   Pneumonia   Hyperglycemia   NSTEMI (non-ST elevated myocardial infarction)    Time spent: 25  min    Cai Anfinson  [facility] Pager [phone] If 7PM-7AM, please contact night-coverage at [email or web address], password [email or web address] 06/11/2014, 8:10 AM  LOS: 2 days

## 2014-06-11 NOTE — Progress Notes (Signed)
Patient complained of back pain, which was treated but unrelieved.  On further assessment, patient did endorse back pain with mild radiation to her chest at a 2 on the pain scale.  Administered 1 sl nitroglycerin and obtained ekg.  Patient reported complete resolution of chest discomfort but with no change in back pain.  MD made aware and additional orders made.  Patient is now resting comfortably.  Will continue to monitor.

## 2014-06-11 NOTE — Plan of Care (Signed)
Problem: Phase I Progression Outcomes Goal: Pain controlled with appropriate interventions Outcome: Progressing     

## 2014-06-11 NOTE — Progress Notes (Addendum)
Patient's urine antigen for legionella came back as +.  Abx changed to levaquin.  Updated family. Marlin CanaryJessica Vann DO

## 2014-06-11 NOTE — Progress Notes (Addendum)
ANTICOAGULATION CONSULT NOTE  Pharmacy Consult for heparin Indication: chest pain/ACS  Allergies  Allergen Reactions  . Amoxicillin Nausea Only    Upset stomach with high dose  . Sporanox [Itraconazole] Hives  . Sulfa Antibiotics Nausea Only    Patient Measurements: Height: 5\' 4"  (162.6 cm) Weight: 114 lb 3.2 oz (51.8 kg) IBW/kg (Calculated) : 54.7 Heparin Dosing Weight: 54 kg  Vital Signs: Temp: 98.2 F (36.8 C) (12/03 1204) Temp Source: Oral (12/03 1204) BP: 115/66 mmHg (12/03 1204) Pulse Rate: 95 (12/03 1204)  Labs:  Recent Labs  06/09/14 1655 06/10/14 0020  06/10/14 0337 06/10/14 0905 06/10/14 1234 06/10/14 1522 06/10/14 2354 06/11/14 0533 06/11/14 1120  HGB 13.4  --   --  12.3  --   --   --   --  11.7*  --   HCT 42.6  --   --  38.2  --   --   --   --  37.1  --   PLT 176  --   --  140*  --   --   --   --  146*  --   HEPARINUNFRC  --   --   < > 0.20*  --   --  0.22* 0.24*  --  0.46  CREATININE 0.74  --   --  1.07  --   --   --   --  0.78  --   TROPONINI  --  5.11*  --   --  6.53* 6.06*  --   --   --   --   < > = values in this interval not displayed.  Estimated Creatinine Clearance: 35.9 mL/min (by C-G formula based on Cr of 0.78).  Assessment: [age] y.o. female on heparin for NSTEMI. Not a candidate for cath. Decision made to proceed with medical management. Heparin level is therapeutic today. hgb 11.7, plts 146, no s/sx bleeding.  Goal of Therapy:  Heparin level 0.3-0.7 units/ml Monitor platelets by anticoagulation protocol: Yes   Plan:  Continue heparin at 1050 units/hr Daily CBC/HL Pt has been on heparin gtt for 48 hours, given proceeding with medical management, consider discontinuing heparin   Agapito GamesAlison Ramandeep Arington, PharmD, BCPS Clinical Pharmacist Pager: [phone] 06/11/2014 1:28 PM    Legionella (+), start levaquin 750 mg po q48h, treat for a total of 7 days   Baldemar FridayMasters, Adisa Litt M 06/11/2014 2:16 PM

## 2014-06-11 NOTE — Progress Notes (Signed)
Patient Name: Julia Black Date of Encounter: 06/11/2014     Principal Problem:   Community acquired pneumonia Active Problems:   Essential hypertension   Acute respiratory failure   Chest pain   Acute on chronic diastolic heart failure   Pneumonia   Hyperglycemia   NSTEMI (non-ST elevated myocardial infarction)    SUBJECTIVE  Still has significant cough and some SOB.   CURRENT MEDS . aspirin EC  325 mg Oral Daily  . azithromycin  500 mg Intravenous Q24H  . cefTRIAXone (ROCEPHIN)  IV  1 g Intravenous Q24H  . fentaNYL  50 mcg Transdermal Q72H  . ferrous sulfate  325 mg Oral Q breakfast  . guaiFENesin  600 mg Oral BID  . ketorolac  15 mg Intravenous Once  . multivitamin with minerals  1 tablet Oral Daily  . omega-3 acid ethyl esters  1,000 mg Oral Daily  . pantoprazole  40 mg Oral Daily  . pregabalin  50 mg Oral Daily  . venlafaxine XR  75 mg Oral Q breakfast    OBJECTIVE  Filed Vitals:   06/11/14 0605 06/11/14 0736 06/11/14 0900 06/11/14 0956  BP: 102/57 120/87    Pulse: 74 88 103   Temp: 97.8 F (36.6 C) 97.7 F (36.5 C)    TempSrc: Oral Oral    Resp: 15 19 22 15   Height:      Weight:      SpO2: 98% 98% 100% 100%    Intake/Output Summary (Last 24 hours) at 06/11/14 1007 Last data filed at 06/11/14 0935  Gross per 24 hour  Intake   1402 ml  Output    975 ml  Net    427 ml   Filed Weights   06/09/14 1650 06/10/14 0030  Weight: 120 lb (54.432 kg) 114 lb 3.2 oz (51.8 kg)    PHYSICAL EXAM  General: Pleasant, NAD. Neuro: Alert and oriented X 3. Moves all extremities spontaneously. Psych: Normal affect. HEENT:  Normal  Neck: Supple without bruits or JVD. Lungs:  Resp regular and unlabored, bilateral rale and rhonchi Heart: regular tachycardic no s3, s4, or murmurs. Abdomen: Soft, non-tender, non-distended, BS + x 4.  Extremities: No clubbing, cyanosis or edema. DP/PT/Radials 2+ and equal bilaterally.  Accessory Clinical Findings  CBC  Recent  Labs  06/09/14 1655 06/10/14 0337 06/11/14 0533  WBC 13.7* 11.3* 9.5  NEUTROABS 11.3* 10.3*  --   HGB 13.4 12.3 11.7*  HCT 42.6 38.2 37.1  MCV 94.7 94.3 93.9  PLT 176 140* 146*   Basic Metabolic Panel  Recent Labs  06/10/14 0337 06/11/14 0533  NA 140 136*  K 4.4 4.9  CL 102 100  CO2 23 24  GLUCOSE 234* 85  BUN 30* 32*  CREATININE 1.07 0.78  CALCIUM 8.5 8.7   Liver Function Tests  Recent Labs  06/10/14 0337 06/11/14 0533  AST 53* 65*  ALT 34 39*  ALKPHOS 166* 167*  BILITOT 0.3 0.3  PROT 6.7 6.0  ALBUMIN 2.8* 2.5*   Cardiac Enzymes  Recent Labs  06/10/14 0020 06/10/14 0905 06/10/14 1234  TROPONINI 5.11* 6.53* 6.06*    TELE Sinus tach with intermittent irregular rhythm    ECG  06/10/2014 EKG Sinus tach with anterolateral TWI  Echocardiogram 04/04/2013  LV EF: 60% -  65%  ------------------------------------------------------------ Indications:   CHF - 428.0.  ------------------------------------------------------------ History:  PMH: Acquired from the patient and from the patient's chart. Atrial fibrillation. Coronary artery disease. Congestive heart failure. Risk factors:  Former tobacco use. Hypertension. Dyslipidemia.  ------------------------------------------------------------ Study Conclusions  - Left ventricle: The cavity size was normal. There was mild focal basal hypertrophy of the septum. Systolic function was normal. The estimated ejection fraction was in the range of 60% to 65%. Wall motion was normal; there were no regional wall motion abnormalities. Features are consistent with a pseudonormal left ventricular filling pattern, with concomitant abnormal relaxation and increased filling pressure (grade 2 diastolic dysfunction). E/medial e' > 15 suggestive of LV end diastolic pressure at least 20 mmHg. - Aortic valve: There was no stenosis. - Mitral valve: Mildly to moderately calcified  annulus. Moderately calcified leaflets . Mild bileaflet prolapse. Mild to moderate regurgitation. - Left atrium: The atrium was mildly to moderately dilated. - Right ventricle: The cavity size was normal. Systolic function was normal. - Right atrium: The atrium was mildly dilated. - Tricuspid valve: Peak RV-RA gradient: 43mm Hg (S). - Pulmonary arteries: PA peak pressure: 48mm Hg (S). - Inferior vena cava: The vessel was normal in size; the respirophasic diameter changes were in the normal range (= 50%); findings are consistent with normal central venous pressure. Impressions:  - Normal LV size with mild focal basal septal hypertrophy. EF 60-65%. Moderate diastolic dysfunction with evidence for elevated LV filling pressure. Normal RV size and systolic function. Mild to moderate pulmonary hypertension. Mild to moderate mitral regurgitation with mild bileaflet prolapse.     Radiology/Studies  Dg Chest 2 View  06/09/2014   CLINICAL DATA:  Cough, shortness of breath, prior coronary bypass .  EXAM: CHEST - 2 VIEW  COMPARISON:  09/03/2013  FINDINGS: diffuse increased vascular and interstitial opacities throughout the lungs compatible with edema. Heart is enlarged. Findings compatible with mild CHF superimposed on background COPD/ emphysema. Lungs are hyperinflated. Minor associated basilar atelectasis. No large effusion or pneumothorax. Trachea midline. Atherosclerosis of the aorta. Bones are osteopenic. Degenerative changes of the spine with an associated scoliosis. Chronic mid thoracic compression fracture deformity.  IMPRESSION: Mild CHF pattern superimposed on COPD/emphysema   Electronically Signed   By: Ruel Favorsrevor  Shick M.D.   On: 06/09/2014 18:10    ASSESSMENT AND PLAN  1 non-ST elevation myocardial infarction - likely demand ischemia in the setting of pneumonia, will not pursue further ischemia evaluation and instead treat medically. Note she is a no CODE BLUE.    - Continue aspirin, heparin and statin.   - BP improving after stopping metoprolol.  - new EKG last night shows sinus tach with TWI in anterolateral leads which is new when compare to 12/1, however trop trending down. Would continue to observe for now. Consider repeat echo since last echo in Sep 2014  2 pneumonia-continue antibiotics.  3 history of atrial fibrillation-patient in sinus tach at present.  - continue ASA 4 chronic diastolic congestive heart failure  - consider restart PO lasix.   Signed, Azalee CourseMeng, Hao PA-C Pager: [phone] As above, patient seen and examined. She remains mildly dyspneic but no chest pain. She has ruled in for a non-ST elevation myocardial infarction. She only wants conservative measures and this is certainly appropriate. Plan aspirin, heparin for another 24 hours, statin. Add low-dose metoprolol as her blood pressure has improved. Continue antibiotics for probable pneumonia. Her volume status is difficult to assess but she does not appear to be significantly volume overloaded on examination. We will most likely resume low-dose Lasix tomorrow morning. Repeat chest x-ray and BNP tomorrow morning. We'll resume low-dose diuresis if needed. Given that she is a no CODE BLUE and  wants only conservative measures I would favor discontinuing telemetry. Echo to assess LV function. Olga MillersBrian Crenshaw

## 2014-06-12 ENCOUNTER — Inpatient Hospital Stay (HOSPITAL_COMMUNITY): Payer: Medicare Other

## 2014-06-12 DIAGNOSIS — I214 Non-ST elevation (NSTEMI) myocardial infarction: Secondary | ICD-10-CM

## 2014-06-12 DIAGNOSIS — I379 Nonrheumatic pulmonary valve disorder, unspecified: Secondary | ICD-10-CM

## 2014-06-12 LAB — BASIC METABOLIC PANEL
Anion gap: 13 (ref 5–15)
BUN: 19 mg/dL (ref 6–23)
CHLORIDE: 99 meq/L (ref 96–112)
CO2: 25 mEq/L (ref 19–32)
Calcium: 9.1 mg/dL (ref 8.4–10.5)
Creatinine, Ser: 0.6 mg/dL (ref 0.50–1.10)
GFR calc Af Amer: 88 mL/min — ABNORMAL LOW (ref >90)
GFR, EST NON AFRICAN AMERICAN: 76 mL/min — AB (ref >90)
GLUCOSE: 93 mg/dL (ref 70–99)
Potassium: 4.2 mEq/L (ref 3.7–5.3)
SODIUM: 137 meq/L (ref 137–147)

## 2014-06-12 LAB — LEGIONELLA ANTIGEN, URINE

## 2014-06-12 LAB — HEPARIN LEVEL (UNFRACTIONATED): Heparin Unfractionated: 0.21 IU/mL — ABNORMAL LOW (ref 0.30–0.70)

## 2014-06-12 LAB — CBC
HEMATOCRIT: 42.7 % (ref 36.0–46.0)
HEMOGLOBIN: 13.4 g/dL (ref 12.0–15.0)
MCH: 29.6 pg (ref 26.0–34.0)
MCHC: 31.4 g/dL (ref 30.0–36.0)
MCV: 94.3 fL (ref 78.0–100.0)
PLATELETS: 146 10*3/uL — AB (ref 150–400)
RBC: 4.53 MIL/uL (ref 3.87–5.11)
RDW: 13.8 % (ref 11.5–15.5)
WBC: 7.5 10*3/uL (ref 4.0–10.5)

## 2014-06-12 LAB — PRO B NATRIURETIC PEPTIDE: Pro B Natriuretic peptide (BNP): 31241 pg/mL — ABNORMAL HIGH (ref 0–450)

## 2014-06-12 MED ORDER — FUROSEMIDE 10 MG/ML IJ SOLN
INTRAMUSCULAR | Status: AC
  Filled 2014-06-12: qty 4

## 2014-06-12 MED ORDER — FUROSEMIDE 10 MG/ML IJ SOLN: 40.0000 mg | Freq: Two times a day (BID) | INTRAMUSCULAR | Status: DC

## 2014-06-12 MED ORDER — ATORVASTATIN CALCIUM 10 MG PO TABS
10.0000 mg | ORAL_TABLET | Freq: Every day | ORAL | Status: DC
  Administered 2014-06-12 – 2014-06-14 (×3): 10 mg via ORAL
  Filled 2014-06-12 (×4): qty 1

## 2014-06-12 MED ORDER — HEPARIN SODIUM (PORCINE) 5000 UNIT/ML IJ SOLN
5000.0000 [IU] | Freq: Three times a day (TID) | INTRAMUSCULAR | Status: DC
  Administered 2014-06-12 – 2014-06-14 (×7): 5000 [IU] via SUBCUTANEOUS
  Filled 2014-06-12 (×10): qty 1

## 2014-06-12 MED ORDER — FUROSEMIDE 10 MG/ML IJ SOLN
40.0000 mg | Freq: Every day | INTRAMUSCULAR | Status: DC
  Administered 2014-06-12: 40 mg via INTRAVENOUS
  Filled 2014-06-12: qty 4

## 2014-06-12 NOTE — Progress Notes (Addendum)
ANTICOAGULATION CONSULT NOTE  Pharmacy Consult for heparin Indication: chest pain/ACS  Allergies  Allergen Reactions  . Amoxicillin Nausea Only    Upset stomach with high dose  . Sporanox [Itraconazole] Hives  . Sulfa Antibiotics Nausea Only    Patient Measurements: Height: 5\' 4"  (162.6 cm) Weight: 110 lb 10.7 oz (50.2 kg) IBW/kg (Calculated) : 54.7 Heparin Dosing Weight: 54 kg  Vital Signs: Temp: 98 F (36.7 C) (12/04 0643) Temp Source: Oral (12/04 0643) BP: 145/81 mmHg (12/04 0643) Pulse Rate: 78 (12/04 0643)  Labs:  Recent Labs  06/10/14 0020 06/10/14 0337 06/10/14 0905 06/10/14 1234  06/10/14 2354 06/11/14 0533 06/11/14 1120 06/12/14 0638  HGB  --  12.3  --   --   --   --  11.7*  --  13.4  HCT  --  38.2  --   --   --   --  37.1  --  42.7  PLT  --  140*  --   --   --   --  146*  --  146*  HEPARINUNFRC  --  0.20*  --   --   < > 0.24*  --  0.46 0.21*  CREATININE  --  1.07  --   --   --   --  0.78  --  0.60  TROPONINI 5.11*  --  6.53* 6.06*  --   --   --   --   --   < > = values in this interval not displayed.  Estimated Creatinine Clearance: 34.8 mL/min (by C-G formula based on Cr of 0.6).  Assessment: 80 y.o. female on heparin for NSTEMI. Not a candidate for cath. Decision made to proceed with medical management. Heparin level is.sub therapeutic today 0.21. hgb 13.4, plts 146, no s/sx bleeding.  Goal of Therapy:  Heparin level 0.3-0.7 units/ml Monitor platelets by anticoagulation protocol: Yes   Plan:  Increase heparin 2 units/kg/hr ( 1150 units/hr) Repeat Heparin level at 1600 Pt has been on heparin gtt for 48 hours, given proceeding with medical management, consider discontinuing heparin     Legionella (+), start levaquin 750 mg po q48h, treat for a total of 7 days   Elder CyphersLorie Poole, Pharm D, BCPS 06/12/2014 8:29 AM

## 2014-06-12 NOTE — Progress Notes (Signed)
Patient Name: Julia Black Date of Encounter: 06/12/2014      SUBJECTIVE  Mild dyspnea; no chest pain; continued productive cough.  CURRENT MEDS . aspirin EC  325 mg Oral Daily  . fentaNYL  50 mcg Transdermal Q72H  . ferrous sulfate  325 mg Oral Q breakfast  . furosemide  40 mg Intravenous Q12H  . guaiFENesin  600 mg Oral BID  . ketorolac  15 mg Intravenous Once  . levofloxacin  750 mg Oral Q48H  . metoprolol tartrate  12.5 mg Oral BID  . multivitamin with minerals  1 tablet Oral Daily  . omega-3 acid ethyl esters  1,000 mg Oral Daily  . pantoprazole  40 mg Oral Daily  . pregabalin  50 mg Oral Daily  . venlafaxine XR  75 mg Oral Q breakfast    OBJECTIVE  Filed Vitals:   06/11/14 1529 06/11/14 2300 06/12/14 0643 06/12/14 0702  BP: 115/62 105/66 145/81   Pulse: 88 78 78   Temp: 98 F (36.7 C) 97.8 F (36.6 C) 98 F (36.7 C)   TempSrc: Oral Oral Oral   Resp: 20 18 20    Height:      Weight:    110 lb 10.7 oz (50.2 kg)  SpO2: 95% 95% 96%     Intake/Output Summary (Last 24 hours) at 06/12/14 1234 Last data filed at 06/12/14 0140  Gross per 24 hour  Intake  571.5 ml  Output    550 ml  Net   21.5 ml   Filed Weights   06/09/14 1650 06/10/14 0030 06/12/14 0702  Weight: 120 lb (54.432 kg) 114 lb 3.2 oz (51.8 kg) 110 lb 10.7 oz (50.2 kg)    PHYSICAL EXAM  General: Pleasant, NAD. Neuro: Alert and oriented X 3. Moves all extremities spontaneously. HEENT:  Normal  Neck: Supple Lungs:  Bibasilar crackles Heart: regular tachycardic no s3, s4, or murmurs. Abdomen: Soft, non-tender, non-distended Extremities: No edema.  Neuro: grossly intact  Accessory Clinical Findings  CBC  Recent Labs  06/09/14 1655 06/10/14 0337 06/11/14 0533 06/12/14 0638  WBC 13.7* 11.3* 9.5 7.5  NEUTROABS 11.3* 10.3*  --   --   HGB 13.4 12.3 11.7* 13.4  HCT 42.6 38.2 37.1 42.7  MCV 94.7 94.3 93.9 94.3  PLT 176 140* 146* 146*   Basic Metabolic Panel  Recent Labs   40/98/1110/09/22 0533 06/12/14 0638  NA 136* 137  K 4.9 4.2  CL 100 99  CO2 24 25  GLUCOSE 85 93  BUN 32* 19  CREATININE 0.78 0.60  CALCIUM 8.7 9.1   Liver Function Tests  Recent Labs  06/10/14 0337 06/11/14 0533  AST 53* 65*  ALT 34 39*  ALKPHOS 166* 167*  BILITOT 0.3 0.3  PROT 6.7 6.0  ALBUMIN 2.8* 2.5*   Cardiac Enzymes  Recent Labs  06/10/14 0020 06/10/14 0905 06/10/14 1234  TROPONINI 5.11* 6.53* 6.06*    Echocardiogram 04/04/2013  LV EF: 60% -  65%  ------------------------------------------------------------ Indications:   CHF - 428.0.  ------------------------------------------------------------ History:  PMH: Acquired from the patient and from the patient's chart. Atrial fibrillation. Coronary artery disease. Congestive heart failure. Risk factors: Former tobacco use. Hypertension. Dyslipidemia.  ------------------------------------------------------------ Study Conclusions  - Left ventricle: The cavity size was normal. There was mild focal basal hypertrophy of the septum. Systolic function was normal. The estimated ejection fraction was in the range of 60% to 65%. Wall motion was normal; there were no regional wall motion abnormalities. Features are consistent  with a pseudonormal left ventricular filling pattern, with concomitant abnormal relaxation and increased filling pressure (grade 2 diastolic dysfunction). E/medial e' > 15 suggestive of LV end diastolic pressure at least 20 mmHg. - Aortic valve: There was no stenosis. - Mitral valve: Mildly to moderately calcified annulus. Moderately calcified leaflets . Mild bileaflet prolapse. Mild to moderate regurgitation. - Left atrium: The atrium was mildly to moderately dilated. - Right ventricle: The cavity size was normal. Systolic function was normal. - Right atrium: The atrium was mildly dilated. - Tricuspid valve: Peak RV-RA gradient: 43mm Hg (S). - Pulmonary  arteries: PA peak pressure: 48mm Hg (S). - Inferior vena cava: The vessel was normal in size; the respirophasic diameter changes were in the normal range (= 50%); findings are consistent with normal central venous pressure. Impressions:  - Normal LV size with mild focal basal septal hypertrophy. EF 60-65%. Moderate diastolic dysfunction with evidence for elevated LV filling pressure. Normal RV size and systolic function. Mild to moderate pulmonary hypertension. Mild to moderate mitral regurgitation with mild bileaflet prolapse.     Radiology/Studies  Dg Chest 2 View  06/09/2014   CLINICAL DATA:  Cough, shortness of breath, prior coronary bypass .  EXAM: CHEST - 2 VIEW  COMPARISON:  09/03/2013  FINDINGS: diffuse increased vascular and interstitial opacities throughout the lungs compatible with edema. Heart is enlarged. Findings compatible with mild CHF superimposed on background COPD/ emphysema. Lungs are hyperinflated. Minor associated basilar atelectasis. No large effusion or pneumothorax. Trachea midline. Atherosclerosis of the aorta. Bones are osteopenic. Degenerative changes of the spine with an associated scoliosis. Chronic mid thoracic compression fracture deformity.  IMPRESSION: Mild CHF pattern superimposed on COPD/emphysema   Electronically Signed   By: Ruel Favorsrevor  Shick M.D.   On: 06/09/2014 18:10   Dg Chest 2v Repeat Same Day  06/12/2014   CLINICAL DATA:  Shortness of breath and wheezing for 6 days.  EXAM: CHEST - 2 VIEW SAME DAY  COMPARISON:  PA and lateral chest 06/09/2014.  FINDINGS: The lungs are emphysematous. There is cardiomegaly. The patient is status post CABG. Pulmonary edema persists without marked change. Small bilateral pleural effusions are identified and slightly increased. No pneumothorax.  IMPRESSION: Slight increase in small bilateral pleural effusions.  Cardiomegaly and pulmonary edema.  Emphysema.   Electronically Signed   By: Drusilla Kannerhomas  Dalessio M.D.    On: 06/12/2014 07:48    ASSESSMENT AND PLAN  1 non-ST elevation myocardial infarction - likely demand ischemia in the setting of pneumonia, will not pursue further ischemia evaluation and instead treat medically. Note she is a no CODE BLUE.   - Continue aspirin and resume statin. Change heparin to SQ  - BP improving.  - resume low dose lopressor  2 pneumonia-continue antibiotics.  3 history of atrial fibrillation-patient in sinus on exam.  - continue ASA 4 chronic diastolic congestive heart failure  - Chest xray with pulmonary edema and BNP elevated; resume lasix and follow renal function. Await FU echo. Signed, Milus HeightBrian CrenshawMD

## 2014-06-12 NOTE — Progress Notes (Addendum)
PROGRESS NOTE  Julia Black ZOX:[id number] DOB: 05-21-1919 DOA: 06/09/2014 PCP: Rene Paci, MD  Assessment/Plan: Pneumonia -legionalla -antibiotics - change to levaquin  -Follow blood cultures -influenza PCR negative -urine Legionella pisitive -strep antigen negative. -mucinex added  non-ST elevation MI -  -cardiology consulted.  -cycle cardiac markers.  -heparin infusion per cards -Aspirin -statins  Decompensated CHF diastolic last EF measured was in September 2014, 60-65% -  -patient has been hypotensive  -resume IV lasix at 40 mg- defer to cardiology -echo pending  Elevated LFTs - Trend -d/c statins - could be congestion from heart failure  Hypertension - -metoprolol added  CAD status post CABG - see above  Hyperlipidemia - statins on hold due to liver enzymes  Hyperglycemia- SSI  Acute rep failure -wean O2 as tolerated  Leukocytosis -resolved  Code Status: DNR Family Communication: daughter at bedside (12/4) Disposition Plan: PT eval- from ILF ([facility])   Consultants:  cardiology  Procedures:      HPI/Subjective: Doing well this AM Pani with coughing  Objective: Filed Vitals:   06/12/14 0643  BP: 145/81  Pulse: 78  Temp: 98 F (36.7 C)  Resp: 20    Intake/Output Summary (Last 24 hours) at 06/12/14 0903 Last data filed at 06/12/14 0140  Gross per 24 hour  Intake    843 ml  Output    550 ml  Net    293 ml   Filed Weights   06/09/14 1650 06/10/14 0030 06/12/14 0702  Weight: 54.432 kg (120 lb) 51.8 kg (114 lb 3.2 oz) 50.2 kg (110 lb 10.7 oz)    Exam:   General:  A+Ox3, NAD  Cardiovascular: rrr  Respiratory: rhonci b/l  Abdomen: +Bs, soft  Musculoskeletal: min edema  Data Reviewed: Basic Metabolic Panel:  Recent Labs Lab 06/09/14 1655 06/10/14 0337 06/11/14 0533 06/12/14 0638  NA 139 140 136* 137  K 4.6 4.4 4.9 4.2  CL 97 102 100 99  CO2 24 23 24 25   GLUCOSE 140* 234* 85 93  BUN 24* 30* 32* 19    CREATININE 0.74 1.07 0.78 0.60  CALCIUM 9.0 8.5 8.7 9.1   Liver Function Tests:  Recent Labs Lab 06/09/14 1655 06/10/14 0337 06/11/14 0533  AST 47* 53* 65*  ALT 36* 34 39*  ALKPHOS 206* 166* 167*  BILITOT 0.9 0.3 0.3  PROT 7.1 6.7 6.0  ALBUMIN 3.2* 2.8* 2.5*   No results for input(s): LIPASE, AMYLASE in the last 168 hours. No results for input(s): AMMONIA in the last 168 hours. CBC:  Recent Labs Lab 06/09/14 1655 06/10/14 0337 06/11/14 0533 06/12/14 0638  WBC 13.7* 11.3* 9.5 7.5  NEUTROABS 11.3* 10.3*  --   --   HGB 13.4 12.3 11.7* 13.4  HCT 42.6 38.2 37.1 42.7  MCV 94.7 94.3 93.9 94.3  PLT 176 140* 146* 146*   Cardiac Enzymes:  Recent Labs Lab 06/10/14 0020 06/10/14 0905 06/10/14 1234  TROPONINI 5.11* 6.53* 6.06*   BNP (last 3 results)  Recent Labs  09/03/13 1235 06/09/14 1711 06/12/14 0638  PROBNP 929.0* 2199.0* 31241.0*   CBG:  Recent Labs Lab 06/10/14 1159 06/10/14 1607 06/10/14 2000 06/11/14 0750 06/11/14 0908  GLUCAP 97 103* 102* 63* 52*    Recent Results (from the past 240 hour(s))  Culture, blood (routine x 2)     Status: None (Preliminary result)   Collection Time: 06/09/14  6:07 PM  Result Value Ref Range Status   Specimen Description BLOOD RIGHT ARM  Final   Special  Requests BOTTLES DRAWN AEROBIC AND ANAEROBIC 5CC  Final   Culture  Setup Time   Final    06/10/2014 05:24 Performed at [facility]    Culture   Final           BLOOD CULTURE RECEIVED NO GROWTH TO DATE CULTURE WILL BE HELD FOR 5 DAYS BEFORE ISSUING A FINAL NEGATIVE REPORT Performed at [facility]    Report Status PENDING  Incomplete  Culture, blood (routine x 2)     Status: None (Preliminary result)   Collection Time: 06/09/14  9:25 PM  Result Value Ref Range Status   Specimen Description BLOOD HAND RIGHT  Final   Special Requests   Final    BOTTLES DRAWN AEROBIC AND ANAEROBIC 10CC PT ON ROCEPHIN, ARITHROMYCIN   Culture  Setup Time   Final     06/10/2014 05:23 Performed at [facility]    Culture   Final           BLOOD CULTURE RECEIVED NO GROWTH TO DATE CULTURE WILL BE HELD FOR 5 DAYS BEFORE ISSUING A FINAL NEGATIVE REPORT Performed at [facility]    Report Status PENDING  Incomplete  MRSA PCR Screening     Status: None   Collection Time: 06/10/14 12:39 AM  Result Value Ref Range Status   MRSA by PCR NEGATIVE NEGATIVE Final    Comment:        The GeneXpert MRSA Assay (FDA approved for NASAL specimens only), is one component of a comprehensive MRSA colonization surveillance program. It is not intended to diagnose MRSA infection nor to guide or monitor treatment for MRSA infections.      Studies: Dg Chest 2v Repeat Same Day  06/12/2014   CLINICAL DATA:  Shortness of breath and wheezing for 6 days.  EXAM: CHEST - 2 VIEW SAME DAY  COMPARISON:  PA and lateral chest 06/09/2014.  FINDINGS: The lungs are emphysematous. There is cardiomegaly. The patient is status post CABG. Pulmonary edema persists without marked change. Small bilateral pleural effusions are identified and slightly increased. No pneumothorax.  IMPRESSION: Slight increase in small bilateral pleural effusions.  Cardiomegaly and pulmonary edema.  Emphysema.   Electronically Signed   By: Drusilla Kannerhomas  Dalessio M.D.   On: 06/12/2014 07:48    Scheduled Meds: . aspirin EC  325 mg Oral Daily  . fentaNYL  50 mcg Transdermal Q72H  . ferrous sulfate  325 mg Oral Q breakfast  . guaiFENesin  600 mg Oral BID  . ketorolac  15 mg Intravenous Once  . levofloxacin  750 mg Oral Q48H  . metoprolol tartrate  12.5 mg Oral BID  . multivitamin with minerals  1 tablet Oral Daily  . omega-3 acid ethyl esters  1,000 mg Oral Daily  . pantoprazole  40 mg Oral Daily  . pregabalin  50 mg Oral Daily  . venlafaxine XR  75 mg Oral Q breakfast   Continuous Infusions: . heparin 1,050 Units/hr (06/12/14 0128)   Antibiotics Given (last 72 hours)    Date/Time Action  Medication Dose   06/11/14 1446 Given   levofloxacin (LEVAQUIN) tablet 750 mg 750 mg      Principal Problem:   Community acquired pneumonia Active Problems:   Essential hypertension   Acute respiratory failure   Chest pain   Acute on chronic diastolic heart failure   Pneumonia   Hyperglycemia   NSTEMI (non-ST elevated myocardial infarction)    Time spent: 25  min  Marlin CanaryVANN, Kery Batzel  [facility] Pager [phone] If 7PM-7AM, please contact night-coverage at [email or web address], password [email or web address] 06/12/2014, 9:03 AM  LOS: 3 days

## 2014-06-12 NOTE — Plan of Care (Signed)
Problem: Phase II Progression Outcomes Goal: Pain controlled Outcome: Completed/Met Date Met:  06/12/14 Goal: Tolerating diet Outcome: Completed/Met Date Met:  06/12/14

## 2014-06-12 NOTE — Progress Notes (Signed)
  Echocardiogram 2D Echocardiogram has been performed.  Julia Black FRANCES 06/12/2014, 12:08 PM

## 2014-06-12 NOTE — Plan of Care (Signed)
Problem: Phase I Progression Outcomes Goal: Pain controlled with appropriate interventions Outcome: Progressing     

## 2014-06-12 NOTE — Evaluation (Signed)
Physical Therapy Evaluation Patient Details Name: Julia Black A Sanabia MRN: [id number] DOB: 01/19/1919 Today's Date: 06/12/2014   History of Present Illness  Patient is an 80 y/o female admitted with SOB and cough, found to have CAP and NSTEMI, now with UTI.  PMH of CAD s/p CABG 10 years ago, hyperlipidemia, HTN.  Clinical Impression  Patient presents with decreased independence and safety with mobility due to deficits listed in PT problem list.  Specifically concern for return to ILF if she needs to go home on O2 and an anticoagulant with her h/o macular degeneration and now generalized weakness.  She would be high fall risk.  Could potentially return to ALF with HHPT ([facility] in [facility]  May need STSNF rehab if unable to get into ALF.    Follow Up Recommendations Home health PT;SNF;Supervision for mobility/OOB (could do HHPT in ALF if able to get into [facility])    Equipment Recommendations  None recommended by PT    Recommendations for Other Services       Precautions / Restrictions Precautions Precautions: Fall Precaution Comments: patient with macular degeneration, now using O2 and likely to remain on anticoagulant      Mobility  Bed Mobility Overal bed mobility: Modified Independent                Transfers Overall transfer level: Needs assistance Equipment used: Rolling walker (2 wheeled) Transfers: Sit to/from Stand Sit to Stand: Supervision         General transfer comment: for safety with O2 tubing and due to increased time for push off from recliner  Ambulation/Gait Ambulation/Gait assistance: Supervision Ambulation Distance (Feet): 150 Feet Assistive device: Rolling walker (2 wheeled) Gait Pattern/deviations: Step-through pattern;Decreased stride length     General Gait Details: hip hike and mild circumduction on left for swing; reports has done since her hip surgery  Stairs            Wheelchair Mobility     Modified Rankin (Stroke Patients Only)       Balance Overall balance assessment: Needs assistance           Standing balance-Leahy Scale: Poor Standing balance comment: standing to pull up panties after toileting, noted knees braced tightly against BSC                             Pertinent Vitals/Pain Pain Assessment: 0-10 Pain Score: 5  Pain Descriptors / Indicators: Headache Pain Intervention(s): Monitored during session    Home Living Family/patient expects to be discharged to:: Private residence Living Arrangements: Alone   Type of Home: Independent living facility Home Access: Level entry     Home Layout: One level Home Equipment: Environmental consultantWalker - 4 wheels;Bedside commode;Shower seat - built in;Grab bars - tub/shower      Prior Function Level of Independence: Independent with assistive device(s)         Comments: takes meals in dining room 1x/day     Hand Dominance        Extremity/Trunk Assessment               Lower Extremity Assessment: Generalized weakness         Communication   Communication: HOH  Cognition Arousal/Alertness: Awake/alert Behavior During Therapy: WFL for tasks assessed/performed Overall Cognitive Status: Within Functional Limits for tasks assessed  General Comments      Exercises        Assessment/Plan    PT Assessment Patient needs continued PT services  PT Diagnosis Generalized weakness   PT Problem List Decreased strength;Decreased activity tolerance;Decreased balance;Decreased safety awareness;Decreased mobility  PT Treatment Interventions DME instruction;Balance training;Gait training;Functional mobility training;Patient/family education;Therapeutic activities;Therapeutic exercise   PT Goals (Current goals can be found in the Care Plan section) Acute Rehab PT Goals Patient Stated Goal: To return to independent safely PT Goal Formulation: With patient/family Time For  Goal Achievement: 06/26/14 Potential to Achieve Goals: Good    Frequency Min 3X/week   Barriers to discharge        Co-evaluation               End of Session Equipment Utilized During Treatment: Gait belt;Oxygen Activity Tolerance: Patient limited by fatigue Patient left: in chair;with call bell/phone within reach;with family/visitor present           Time: 1455-1533 PT Time Calculation (min) (ACUTE ONLY): 38 min   Charges:   PT Evaluation $Initial PT Evaluation Tier I: 1 Procedure PT Treatments $Gait Training: 8-22 mins $Therapeutic Activity: 8-22 mins   PT G Codes:          Kouper Spinella,CYNDI 06/12/2014, 4:22 PM Sheran Lawlessyndi Rudy Domek, PT [phone] 06/12/2014

## 2014-06-13 DIAGNOSIS — A481 Legionnaires' disease: Principal | ICD-10-CM

## 2014-06-13 LAB — BASIC METABOLIC PANEL
Anion gap: 14 (ref 5–15)
BUN: 21 mg/dL (ref 6–23)
CHLORIDE: 100 meq/L (ref 96–112)
CO2: 26 mEq/L (ref 19–32)
Calcium: 9.3 mg/dL (ref 8.4–10.5)
Creatinine, Ser: 0.68 mg/dL (ref 0.50–1.10)
GFR, EST AFRICAN AMERICAN: 85 mL/min — AB (ref >90)
GFR, EST NON AFRICAN AMERICAN: 73 mL/min — AB (ref >90)
Glucose, Bld: 93 mg/dL (ref 70–99)
POTASSIUM: 4.1 meq/L (ref 3.7–5.3)
Sodium: 140 mEq/L (ref 137–147)

## 2014-06-13 MED ORDER — FUROSEMIDE 20 MG PO TABS
20.0000 mg | ORAL_TABLET | Freq: Every day | ORAL | Status: DC
  Administered 2014-06-13 – 2014-06-15 (×3): 20 mg via ORAL
  Filled 2014-06-13 (×3): qty 1

## 2014-06-13 NOTE — Progress Notes (Signed)
Chart reviewed.   PROGRESS NOTE  Julia Black A Cid ZOX:[id number] DOB: 10/18/1918 DOA: 06/09/2014 PCP: Rene PaciValerie Leschber, MD  Assessment/Plan: Pneumonia -legionella Continue levofloxacin. Will treat for 10 days total.   non-ST elevation MI -  Awaiting echo report. Aspirin, statin, beta blocker  Decompensated CHF diastolic last EF measured was in September 2014, 60-65% -  -echo pending. Yesterday's proBNP and CXR worse, but clinically looks good today  Elevated LFTs, mild. Could be from legionella pneumonia v. Hepatic congestion. monitor  Hypertension -  CAD status post CABG - see above  Hyperlipidemia   Hyperglycemia-not diabetic. None further  Acute rep failure Check RA sat. D/c O2 if ok  Code Status: DNR Family Communication: daughter in law at bedside (12/4) [facility] Family will be able to stay with her. And get PT, OT, RN  Consultants:  cardiology  Procedures:      HPI/Subjective: No dyspnea. Cough improved. Appetite not great, but no N/V/D. No CP  Objective: Filed Vitals:   06/13/14 1108  BP: 101/56  Pulse: 88  Temp:   Resp:     Intake/Output Summary (Last 24 hours) at 06/13/14 1359 Last data filed at 06/13/14 0910  Gross per 24 hour  Intake    680 ml  Output    600 ml  Net     80 ml   Filed Weights   06/10/14 0030 06/12/14 0702 06/13/14 0500  Weight: 51.8 kg (114 lb 3.2 oz) 50.2 kg (110 lb 10.7 oz) 52.164 kg (115 lb)    Exam:   General:  A+Ox3, NAD  Cardiovascular: rrr without MGR  Respiratory: CTA without WRR  Abdomen: +Bs, soft, NT, ND  Musculoskeletal: min edema, no clubbing cyanosis  Data Reviewed: Basic Metabolic Panel:  Recent Labs Lab 06/09/14 1655 06/10/14 0337 06/11/14 0533 06/12/14 0638 06/13/14 0625  NA 139 140 136* 137 140  K 4.6 4.4 4.9 4.2 4.1  CL 97 102 100 99 100  CO2 24 23 24 25 26   GLUCOSE 140* 234* 85 93 93  BUN 24* 30* 32* 19 21  CREATININE 0.74 1.07 0.78 0.60 0.68    CALCIUM 9.0 8.5 8.7 9.1 9.3   Liver Function Tests:  Recent Labs Lab 06/09/14 1655 06/10/14 0337 06/11/14 0533  AST 47* 53* 65*  ALT 36* 34 39*  ALKPHOS 206* 166* 167*  BILITOT 0.9 0.3 0.3  PROT 7.1 6.7 6.0  ALBUMIN 3.2* 2.8* 2.5*   No results for input(s): LIPASE, AMYLASE in the last 168 hours. No results for input(s): AMMONIA in the last 168 hours. CBC:  Recent Labs Lab 06/09/14 1655 06/10/14 0337 06/11/14 0533 06/12/14 0638  WBC 13.7* 11.3* 9.5 7.5  NEUTROABS 11.3* 10.3*  --   --   HGB 13.4 12.3 11.7* 13.4  HCT 42.6 38.2 37.1 42.7  MCV 94.7 94.3 93.9 94.3  PLT 176 140* 146* 146*   Cardiac Enzymes:  Recent Labs Lab 06/10/14 0020 06/10/14 0905 06/10/14 1234  TROPONINI 5.11* 6.53* 6.06*   BNP (last 3 results)  Recent Labs  09/03/13 1235 06/09/14 1711 06/12/14 0638  PROBNP 929.0* 2199.0* 31241.0*   CBG:  Recent Labs Lab 06/10/14 1159 06/10/14 1607 06/10/14 2000 06/11/14 0750 06/11/14 0908  GLUCAP 97 103* 102* 63* 52*    Recent Results (from the past 240 hour(s))  Culture, blood (routine x 2)     Status: None (Preliminary result)   Collection Time: 06/09/14  6:07 PM  Result Value Ref Range Status   Specimen  Description BLOOD RIGHT ARM  Final   Special Requests BOTTLES DRAWN AEROBIC AND ANAEROBIC 5CC  Final   Culture  Setup Time   Final    06/10/2014 05:24 Performed at [facility]    Culture   Final           BLOOD CULTURE RECEIVED NO GROWTH TO DATE CULTURE WILL BE HELD FOR 5 DAYS BEFORE ISSUING A FINAL NEGATIVE REPORT Performed at [facility]    Report Status PENDING  Incomplete  Culture, blood (routine x 2)     Status: None (Preliminary result)   Collection Time: 06/09/14  9:25 PM  Result Value Ref Range Status   Specimen Description BLOOD HAND RIGHT  Final   Special Requests   Final    BOTTLES DRAWN AEROBIC AND ANAEROBIC 10CC PT ON ROCEPHIN, ARITHROMYCIN   Culture  Setup Time   Final    06/10/2014  05:23 Performed at [facility]    Culture   Final           BLOOD CULTURE RECEIVED NO GROWTH TO DATE CULTURE WILL BE HELD FOR 5 DAYS BEFORE ISSUING A FINAL NEGATIVE REPORT Performed at [facility]    Report Status PENDING  Incomplete  MRSA PCR Screening     Status: None   Collection Time: 06/10/14 12:39 AM  Result Value Ref Range Status   MRSA by PCR NEGATIVE NEGATIVE Final    Comment:        The GeneXpert MRSA Assay (FDA approved for NASAL specimens only), is one component of a comprehensive MRSA colonization surveillance program. It is not intended to diagnose MRSA infection nor to guide or monitor treatment for MRSA infections.      Studies: Dg Chest 2v Repeat Same Day  06/12/2014   CLINICAL DATA:  Shortness of breath and wheezing for 6 days.  EXAM: CHEST - 2 VIEW SAME DAY  COMPARISON:  PA and lateral chest 06/09/2014.  FINDINGS: The lungs are emphysematous. There is cardiomegaly. The patient is status post CABG. Pulmonary edema persists without marked change. Small bilateral pleural effusions are identified and slightly increased. No pneumothorax.  IMPRESSION: Slight increase in small bilateral pleural effusions.  Cardiomegaly and pulmonary edema.  Emphysema.   Electronically Signed   By: Drusilla Kannerhomas  Dalessio M.D.   On: 06/12/2014 07:48    Scheduled Meds: . aspirin EC  325 mg Oral Daily  . atorvastatin  10 mg Oral q1800  . fentaNYL  50 mcg Transdermal Q72H  . ferrous sulfate  325 mg Oral Q breakfast  . furosemide  20 mg Oral Daily  . guaiFENesin  600 mg Oral BID  . heparin subcutaneous  5,000 Units Subcutaneous 3 times per day  . ketorolac  15 mg Intravenous Once  . levofloxacin  750 mg Oral Q48H  . metoprolol tartrate  12.5 mg Oral BID  . multivitamin with minerals  1 tablet Oral Daily  . omega-3 acid ethyl esters  1,000 mg Oral Daily  . pantoprazole  40 mg Oral Daily  . pregabalin  50 mg Oral Daily  . venlafaxine XR  75 mg Oral Q breakfast    Continuous Infusions:   Antibiotics Given (last 72 hours)    Date/Time Action Medication Dose   06/11/14 1446 Given   levofloxacin (LEVAQUIN) tablet 750 mg 750 mg     Time spent: 25  min  Eulan Heyward L  [facility] Text page [email or web address], password [email or web address] 06/13/2014, 1:59 PM  LOS: 4 days

## 2014-06-13 NOTE — Clinical Social Work Psychosocial (Addendum)
Clinical Social Work Department BRIEF PSYCHOSOCIAL ASSESSMENT 06/13/2014  Patient:  Julia Black, Julia Black     Account Number:  [id number]     Admit date:  06/09/2014  Clinical Social Worker:  Hubert Azure  Date/Time:  06/13/2014 04:38 PM  Referred by:  Physician  Date Referred:  06/13/2014 Referred for  SNF Placement   Other Referral:   Interview type:  Patient Other interview type:   CSW spoke with patient's daughter Tonette Bihari    PSYCHOSOCIAL DATA Living Status:  ALONE Admitted from facility:  [facility] Level of care:  Independent Living Primary support name:  Tonette Bihari ([phone]) Primary support relationship to patient:  CHILD, ADULT Degree of support available:   Good    CURRENT CONCERNS Current Concerns  Post-Acute Placement   Other Concerns:    SOCIAL WORK ASSESSMENT / PLAN CSW met with patient who was alert and oriented. Patient was cooperative and pleasant. CSW introduced self and explained role. Patient reports she has been living at [facility] [place] [place]. Per patient, she has received therapy at [facility] through [facility], an in-house service. Patient reports she is agreeable to receiving PT/OT and RN services through [facility]. Patient states her daughter is currently working on coordination of therapeutic services with [facility]. CSW asked patient for permission to contact daughter. Patient is agreeable.    CSW contacted patient's daughter Manuela Schwartz. Per Manuela Schwartz, she intends to contact staff at [facility] on Sunday 12/6 or Monday 12/7 to arrange for patient to receive PT/OT with [facility]. Manuela Schwartz reports [facility] is contracted with [facility] to provide rehab services to residents. Manuela Schwartz states patient knows the rehab staff with [facility] and would prefer to receive therapy through the agency again. Manuela Schwartz also states patient will have a family in the home with her to provide the supervision assistance needed.   Assessment/plan  status:  Other - See comment Other assessment/ plan:   CSW to contact [facility] to confirm PT/OT services.   Information/referral to community resources:    PATIENT'S/FAMILY'S RESPONSE TO PLAN OF CARE: Patient and daughter were cooperative and pleasant. Patient verbalized understanding of rehab services in order to "get better" and is agreeable to receiving the services in the familiarity of her residential community. Patient's daughter is agreeable to patient receiving rehabilitative services with "people she already knows and who know her."    Carrington Clamp, [place] Weekend Clinical Social Worker [phone]

## 2014-06-13 NOTE — Plan of Care (Signed)
Problem: Consults Goal: Diabetes Guidelines if Diabetic/Glucose > 140 If diabetic or lab glucose is > 140 mg/dl - Initiate Diabetes/Hyperglycemia Guidelines & Document Interventions  Outcome: Not Applicable Date Met:  86/75/44  Problem: Phase I Progression Outcomes Goal: Pain controlled with appropriate interventions Outcome: Completed/Met Date Met:  06/13/14 Goal: Hemodynamically stable Outcome: Completed/Met Date Met:  06/13/14 Goal: Other Phase I Outcomes/Goals Outcome: Not Applicable Date Met:  92/01/00  Problem: Phase II Progression Outcomes Goal: Walk in hall or up in chair TID Outcome: Completed/Met Date Met:  06/13/14

## 2014-06-13 NOTE — Progress Notes (Signed)
Patient Name: Julia HarrisonIris A Black Date of Encounter: 06/13/2014      SUBJECTIVE  Mild dyspnea; no chest pain; continued productive cough.  CURRENT MEDS . aspirin EC  325 mg Oral Daily  . atorvastatin  10 mg Oral q1800  . fentaNYL  50 mcg Transdermal Q72H  . ferrous sulfate  325 mg Oral Q breakfast  . furosemide  40 mg Intravenous Daily  . guaiFENesin  600 mg Oral BID  . heparin subcutaneous  5,000 Units Subcutaneous 3 times per day  . ketorolac  15 mg Intravenous Once  . levofloxacin  750 mg Oral Q48H  . metoprolol tartrate  12.5 mg Oral BID  . multivitamin with minerals  1 tablet Oral Daily  . omega-3 acid ethyl esters  1,000 mg Oral Daily  . pantoprazole  40 mg Oral Daily  . pregabalin  50 mg Oral Daily  . venlafaxine XR  75 mg Oral Q breakfast    OBJECTIVE  Filed Vitals:   06/12/14 1358 06/12/14 2205 06/13/14 0500 06/13/14 0615  BP: 118/77 113/66  118/73  Pulse: 106 89  92  Temp: 97.6 F (36.4 C) 98.4 F (36.9 C)  97.4 F (36.3 C)  TempSrc: Oral Oral  Oral  Resp: 18 22  18   Height:      Weight:   115 lb (52.164 kg)   SpO2:  97%  100%    Intake/Output Summary (Last 24 hours) at 06/13/14 1038 Last data filed at 06/13/14 0910  Gross per 24 hour  Intake    560 ml  Output    725 ml  Net   -165 ml   Filed Weights   06/10/14 0030 06/12/14 0702 06/13/14 0500  Weight: 114 lb 3.2 oz (51.8 kg) 110 lb 10.7 oz (50.2 kg) 115 lb (52.164 kg)    PHYSICAL EXAM  General: Pleasant, NAD. Neuro: Alert and oriented X 3. Moves all extremities spontaneously. HEENT:  Normal  Neck: Supple Lungs:  Bibasilar crackles Heart: regular tachycardic no s3, s4, or murmurs. Abdomen: Soft, non-tender, non-distended Extremities: No edema.  Neuro: grossly intact  Accessory Clinical Findings  CBC  Recent Labs  06/11/14 0533 06/12/14 0638  WBC 9.5 7.5  HGB 11.7* 13.4  HCT 37.1 42.7  MCV 93.9 94.3  PLT 146* 146*   Basic Metabolic Panel  Recent Labs  06/12/14 0638  06/13/14 0625  NA 137 140  K 4.2 4.1  CL 99 100  CO2 25 26  GLUCOSE 93 93  BUN 19 21  CREATININE 0.60 0.68  CALCIUM 9.1 9.3   Liver Function Tests  Recent Labs  06/11/14 0533  AST 65*  ALT 39*  ALKPHOS 167*  BILITOT 0.3  PROT 6.0  ALBUMIN 2.5*   Cardiac Enzymes  Recent Labs  06/10/14 1234  TROPONINI 6.06*    Echocardiogram 04/04/2013  LV EF: 60% -  65%  ------------------------------------------------------------ Indications:   CHF - 428.0.  ------------------------------------------------------------ History:  PMH: Acquired from the patient and from the patient's chart. Atrial fibrillation. Coronary artery disease. Congestive heart failure. Risk factors: Former tobacco use. Hypertension. Dyslipidemia.  ------------------------------------------------------------ Study Conclusions  - Left ventricle: The cavity size was normal. There was mild focal basal hypertrophy of the septum. Systolic function was normal. The estimated ejection fraction was in the range of 60% to 65%. Wall motion was normal; there were no regional wall motion abnormalities. Features are consistent with a pseudonormal left ventricular filling pattern, with concomitant abnormal relaxation and increased filling pressure (grade 2 diastolic  dysfunction). E/medial e' > 15 suggestive of LV end diastolic pressure at least 20 mmHg. - Aortic valve: There was no stenosis. - Mitral valve: Mildly to moderately calcified annulus. Moderately calcified leaflets . Mild bileaflet prolapse. Mild to moderate regurgitation. - Left atrium: The atrium was mildly to moderately dilated. - Right ventricle: The cavity size was normal. Systolic function was normal. - Right atrium: The atrium was mildly dilated. - Tricuspid valve: Peak RV-RA gradient: 43mm Hg (S). - Pulmonary arteries: PA peak pressure: 48mm Hg (S). - Inferior vena cava: The vessel was normal in size;  the respirophasic diameter changes were in the normal range (= 50%); findings are consistent with normal central venous pressure. Impressions:  - Normal LV size with mild focal basal septal hypertrophy. EF 60-65%. Moderate diastolic dysfunction with evidence for elevated LV filling pressure. Normal RV size and systolic function. Mild to moderate pulmonary hypertension. Mild to moderate mitral regurgitation with mild bileaflet prolapse.     Radiology/Studies  Dg Chest 2 View  06/09/2014   CLINICAL DATA:  Cough, shortness of breath, prior coronary bypass .  EXAM: CHEST - 2 VIEW  COMPARISON:  09/03/2013  FINDINGS: diffuse increased vascular and interstitial opacities throughout the lungs compatible with edema. Heart is enlarged. Findings compatible with mild CHF superimposed on background COPD/ emphysema. Lungs are hyperinflated. Minor associated basilar atelectasis. No large effusion or pneumothorax. Trachea midline. Atherosclerosis of the aorta. Bones are osteopenic. Degenerative changes of the spine with an associated scoliosis. Chronic mid thoracic compression fracture deformity.  IMPRESSION: Mild CHF pattern superimposed on COPD/emphysema   Electronically Signed   By: Ruel Favorsrevor  Shick M.D.   On: 06/09/2014 18:10   Dg Chest 2v Repeat Same Day  06/12/2014   CLINICAL DATA:  Shortness of breath and wheezing for 6 days.  EXAM: CHEST - 2 VIEW SAME DAY  COMPARISON:  PA and lateral chest 06/09/2014.  FINDINGS: The lungs are emphysematous. There is cardiomegaly. The patient is status post CABG. Pulmonary edema persists without marked change. Small bilateral pleural effusions are identified and slightly increased. No pneumothorax.  IMPRESSION: Slight increase in small bilateral pleural effusions.  Cardiomegaly and pulmonary edema.  Emphysema.   Electronically Signed   By: Drusilla Kannerhomas  Dalessio M.D.   On: 06/12/2014 07:48    ASSESSMENT AND PLAN  1 non-ST elevation myocardial infarction -  likely demand ischemia in the setting of pneumonia, will not pursue further ischemia evaluation and instead treat medically. Note she is a no CODE BLUE.   - Continue aspirin and statin. Continue low dose lopressor  2 pneumonia-continue antibiotics.  3 history of atrial fibrillation-patient in sinus on exam.  - continue ASA 4 chronic diastolic congestive heart failure  - Chest xray with pulmonary edema and BNP elevated yesterday; . Await FU echo. Resume home dose of lasix. Overall much improved. Signed, Milus HeightBrian CrenshawMD

## 2014-06-14 DIAGNOSIS — I248 Other forms of acute ischemic heart disease: Secondary | ICD-10-CM

## 2014-06-14 LAB — COMPREHENSIVE METABOLIC PANEL
ALBUMIN: 2.6 g/dL — AB (ref 3.5–5.2)
ALK PHOS: 170 U/L — AB (ref 39–117)
ALT: 49 U/L — AB (ref 0–35)
AST: 44 U/L — ABNORMAL HIGH (ref 0–37)
Anion gap: 13 (ref 5–15)
BUN: 20 mg/dL (ref 6–23)
CO2: 30 mEq/L (ref 19–32)
Calcium: 9.4 mg/dL (ref 8.4–10.5)
Chloride: 99 mEq/L (ref 96–112)
Creatinine, Ser: 0.75 mg/dL (ref 0.50–1.10)
GFR calc Af Amer: 82 mL/min — ABNORMAL LOW (ref >90)
GFR calc non Af Amer: 71 mL/min — ABNORMAL LOW (ref >90)
GLUCOSE: 88 mg/dL (ref 70–99)
POTASSIUM: 3.9 meq/L (ref 3.7–5.3)
Sodium: 142 mEq/L (ref 137–147)
Total Bilirubin: 0.5 mg/dL (ref 0.3–1.2)
Total Protein: 6.3 g/dL (ref 6.0–8.3)

## 2014-06-14 MED ORDER — HYDROCODONE-ACETAMINOPHEN 5-325 MG PO TABS
1.0000 | ORAL_TABLET | Freq: Four times a day (QID) | ORAL | Status: DC | PRN
  Administered 2014-06-14 – 2014-06-15 (×4): 1 via ORAL
  Filled 2014-06-14 (×4): qty 1

## 2014-06-14 NOTE — Plan of Care (Signed)
Problem: Phase II Progression Outcomes Goal: Dyspnea controlled with activity Outcome: Completed/Met Date Met:  06/14/14 Goal: Fluid volume status improved Outcome: Completed/Met Date Met:  06/14/14

## 2014-06-14 NOTE — Progress Notes (Addendum)
Patient Name: Julia Black Date of Encounter: 06/14/2014      SUBJECTIVE  No dyspnea; no chest pain;minimal cough, improved. Spoke to daughter in room.   Feeling much better.   CURRENT MEDS . aspirin EC  325 mg Oral Daily  . atorvastatin  10 mg Oral q1800  . fentaNYL  50 mcg Transdermal Q72H  . ferrous sulfate  325 mg Oral Q breakfast  . furosemide  20 mg Oral Daily  . guaiFENesin  600 mg Oral BID  . heparin subcutaneous  5,000 Units Subcutaneous 3 times per day  . levofloxacin  750 mg Oral Q48H  . metoprolol tartrate  12.5 mg Oral BID  . multivitamin with minerals  1 tablet Oral Daily  . omega-3 acid ethyl esters  1,000 mg Oral Daily  . pantoprazole  40 mg Oral Daily  . pregabalin  50 mg Oral Daily  . venlafaxine XR  75 mg Oral Q breakfast    OBJECTIVE  Filed Vitals:   06/13/14 1408 06/13/14 2120 06/14/14 0513 06/14/14 0526  BP: 105/62 100/61  105/68  Pulse: 73 93  98  Temp: 97.8 F (36.6 C) 98.2 F (36.8 C)  97.6 F (36.4 C)  TempSrc: Oral Oral  Oral  Resp: 16 18  16   Height:      Weight:   112 lb 3.2 oz (50.894 kg)   SpO2: 90% 94%  96%    Intake/Output Summary (Last 24 hours) at 06/14/14 1217 Last data filed at 06/14/14 1046  Gross per 24 hour  Intake    200 ml  Output     75 ml  Net    125 ml   Filed Weights   06/12/14 0702 06/13/14 0500 06/14/14 0513  Weight: 110 lb 10.7 oz (50.2 kg) 115 lb (52.164 kg) 112 lb 3.2 oz (50.894 kg)    PHYSICAL EXAM  General: Pleasant, NAD. Neuro: Alert and oriented X 3. Moves all extremities spontaneously. HEENT:  Normal  Neck: Supple Lungs:  Bibasilar crackles Heart: regular tachycardic no s3, s4, or murmurs. Abdomen: Soft, non-tender, non-distended Extremities: No edema.  Neuro: grossly intact  Accessory Clinical Findings  CBC  Recent Labs  06/12/14 0638  WBC 7.5  HGB 13.4  HCT 42.7  MCV 94.3  PLT 146*   Basic Metabolic Panel  Recent Labs  06/13/14 0625 06/14/14 0634  NA 140 142  K 4.1 3.9   CL 100 99  CO2 26 30  GLUCOSE 93 88  BUN 21 20  CREATININE 0.68 0.75  CALCIUM 9.3 9.4   Liver Function Tests  Recent Labs  06/14/14 0634  AST 44*  ALT 49*  ALKPHOS 170*  BILITOT 0.5  PROT 6.3  ALBUMIN 2.6*   Cardiac Enzymes No results for input(s): CKTOTAL, CKMB, CKMBINDEX, TROPONINI in the last 72 hours.  Echocardiogram 04/04/2013  LV EF: 60% -  65%  ------------------------------------------------------------ Indications:   CHF - 428.0.  ------------------------------------------------------------ History:  PMH: Acquired from the patient and from the patient's chart. Atrial fibrillation. Coronary artery disease. Congestive heart failure. Risk factors: Former tobacco use. Hypertension. Dyslipidemia.  ------------------------------------------------------------ Study Conclusions  - Left ventricle: The cavity size was normal. There was mild focal basal hypertrophy of the septum. Systolic function was normal. The estimated ejection fraction was in the range of 60% to 65%. Wall motion was normal; there were no regional wall motion abnormalities. Features are consistent with a pseudonormal left ventricular filling pattern, with concomitant abnormal relaxation and increased filling pressure (grade 2  diastolic dysfunction). E/medial e' > 15 suggestive of LV end diastolic pressure at least 20 mmHg. - Aortic valve: There was no stenosis. - Mitral valve: Mildly to moderately calcified annulus. Moderately calcified leaflets . Mild bileaflet prolapse. Mild to moderate regurgitation. - Left atrium: The atrium was mildly to moderately dilated. - Right ventricle: The cavity size was normal. Systolic function was normal. - Right atrium: The atrium was mildly dilated. - Tricuspid valve: Peak RV-RA gradient: 43mm Hg (S). - Pulmonary arteries: PA peak pressure: 48mm Hg (S). - Inferior vena cava: The vessel was normal in size;  the respirophasic diameter changes were in the normal range (= 50%); findings are consistent with normal central venous pressure. Impressions:  - Normal LV size with mild focal basal septal hypertrophy. EF 60-65%. Moderate diastolic dysfunction with evidence for elevated LV filling pressure. Normal RV size and systolic function. Mild to moderate pulmonary hypertension. Mild to moderate mitral regurgitation with mild bileaflet prolapse.     Radiology/Studies  Dg Chest 2 View  06/09/2014   CLINICAL DATA:  Cough, shortness of breath, prior coronary bypass .  EXAM: CHEST - 2 VIEW  COMPARISON:  09/03/2013  FINDINGS: diffuse increased vascular and interstitial opacities throughout the lungs compatible with edema. Heart is enlarged. Findings compatible with mild CHF superimposed on background COPD/ emphysema. Lungs are hyperinflated. Minor associated basilar atelectasis. No large effusion or pneumothorax. Trachea midline. Atherosclerosis of the aorta. Bones are osteopenic. Degenerative changes of the spine with an associated scoliosis. Chronic mid thoracic compression fracture deformity.  IMPRESSION: Mild CHF pattern superimposed on COPD/emphysema   Electronically Signed   By: Ruel Favorsrevor  Shick M.D.   On: 06/09/2014 18:10   Dg Chest 2v Repeat Same Day  06/12/2014   CLINICAL DATA:  Shortness of breath and wheezing for 6 days.  EXAM: CHEST - 2 VIEW SAME DAY  COMPARISON:  PA and lateral chest 06/09/2014.  FINDINGS: The lungs are emphysematous. There is cardiomegaly. The patient is status post CABG. Pulmonary edema persists without marked change. Small bilateral pleural effusions are identified and slightly increased. No pneumothorax.  IMPRESSION: Slight increase in small bilateral pleural effusions.  Cardiomegaly and pulmonary edema.  Emphysema.   Electronically Signed   By: Drusilla Kannerhomas  Dalessio M.D.   On: 06/12/2014 07:48    ASSESSMENT AND PLAN  1 non-ST elevation myocardial infarction -  likely demand ischemia in the setting of pneumonia, will not pursue further ischemia evaluation and instead treat medically. Note she is a no CODE BLUE.   - Continue aspirin and statin. Continue low dose lopressor  2 pneumonia-continue antibiotics. 10 day course  3 history of atrial fibrillation-patient in sinus on exam.  - continue ASA 4 chronic diastolic congestive heart failure  - Chest xray with pulmonary edema and BNP elevated yesterday; . Await FU echo.(There is IT issue with downloading)   Resumedhome dose of lasix.   Overall much improved.  Lonna DuvalSigned, SKAINS, MARKMD

## 2014-06-14 NOTE — Progress Notes (Signed)
PROGRESS NOTE  Georgeanna Harrisonris A [facility] ZOX:[id number] DOB: 07/01/1919 DOA: 06/09/2014 PCP: Rene PaciValerie Leschber, MD  Assessment/Plan: Pneumonia -legionella Continue levofloxacin. Will treat for 10 days total.   non-ST elevation MI -  Awaiting echo report. Aspirin, statin, beta blocker  Decompensated CHF diastolic last EF measured was in September 2014, 60-65% -  -echo pending. Clinically stable  Elevated LFTs, mild. Could be from legionella pneumonia v. Hepatic congestion  Hypertension -  CAD status post CABG - see above  Hyperlipidemia   Hyperglycemia-not diabetic. None further  Acute rep failure sats ok off oxygen  Constipation: taking miralax  Likely home tomorrow.  Code Status: DNR Family Communication: daughter in law at bedside (12/4) [facility] Plan[facility]. Family will be able to stay with her. And get PT, OT, RN  Consultants:  cardiology  Procedures:      HPI/Subjective: No dyspnea. Cough improved. Appetite not great, but no N/V/D. No CP  Objective: Filed Vitals:   06/14/14 0526  BP: 105/68  Pulse: 98  Temp: 97.6 F (36.4 C)  Resp: 16    Intake/Output Summary (Last 24 hours) at 06/14/14 1253 Last data filed at 06/14/14 1046  Gross per 24 hour  Intake    200 ml  Output     75 ml  Net    125 ml   Filed Weights   06/12/14 0702 06/13/14 0500 06/14/14 0513  Weight: 50.2 kg (110 lb 10.7 oz) 52.164 kg (115 lb) 50.894 kg (112 lb 3.2 oz)    Exam:   General:  A+Ox3, NAD  Cardiovascular: rrr without MGR  Respiratory: CTA without WRR  Abdomen: +Bs, soft, NT, ND  Musculoskeletal: min edema, no clubbing cyanosis  Data Reviewed: Basic Metabolic Panel:  Recent Labs Lab 06/10/14 0337 06/11/14 0533 06/12/14 0638 06/13/14 0625 06/14/14 0634  NA 140 136* 137 140 142  K 4.4 4.9 4.2 4.1 3.9  CL 102 100 99 100 99  CO2 23 24 25 26 30   GLUCOSE 234* 85 93 93 88  BUN 30* 32* 19 21 20   CREATININE 1.07 0.78 0.60 0.68 0.75  CALCIUM 8.5 8.7  9.1 9.3 9.4   Liver Function Tests:  Recent Labs Lab 06/09/14 1655 06/10/14 0337 06/11/14 0533 06/14/14 0634  AST 47* 53* 65* 44*  ALT 36* 34 39* 49*  ALKPHOS 206* 166* 167* 170*  BILITOT 0.9 0.3 0.3 0.5  PROT 7.1 6.7 6.0 6.3  ALBUMIN 3.2* 2.8* 2.5* 2.6*   No results for input(s): LIPASE, AMYLASE in the last 168 hours. No results for input(s): AMMONIA in the last 168 hours. CBC:  Recent Labs Lab 06/09/14 1655 06/10/14 0337 06/11/14 0533 06/12/14 0638  WBC 13.7* 11.3* 9.5 7.5  NEUTROABS 11.3* 10.3*  --   --   HGB 13.4 12.3 11.7* 13.4  HCT 42.6 38.2 37.1 42.7  MCV 94.7 94.3 93.9 94.3  PLT 176 140* 146* 146*   Cardiac Enzymes:  Recent Labs Lab 06/10/14 0020 06/10/14 0905 06/10/14 1234  TROPONINI 5.11* 6.53* 6.06*   BNP (last 3 results)  Recent Labs  09/03/13 1235 06/09/14 1711 06/12/14 0638  PROBNP 929.0* 2199.0* 31241.0*   CBG:  Recent Labs Lab 06/10/14 1159 06/10/14 1607 06/10/14 2000 06/11/14 0750 06/11/14 0908  GLUCAP 97 103* 102* 63* 52*    Recent Results (from the past 240 hour(s))  Culture, blood (routine x 2)     Status: None (Preliminary result)   Collection Time: 06/09/14  6:07 PM  Result Value Ref Range Status  Specimen Description BLOOD RIGHT ARM  Final   Special Requests BOTTLES DRAWN AEROBIC AND ANAEROBIC 5CC  Final   Culture  Setup Time   Final    06/10/2014 05:24 Performed at [facility]    Culture   Final           BLOOD CULTURE RECEIVED NO GROWTH TO DATE CULTURE WILL BE HELD FOR 5 DAYS BEFORE ISSUING A FINAL NEGATIVE REPORT Performed at [facility]    Report Status PENDING  Incomplete  Culture, blood (routine x 2)     Status: None (Preliminary result)   Collection Time: 06/09/14  9:25 PM  Result Value Ref Range Status   Specimen Description BLOOD HAND RIGHT  Final   Special Requests   Final    BOTTLES DRAWN AEROBIC AND ANAEROBIC 10CC PT ON ROCEPHIN, ARITHROMYCIN   Culture  Setup Time   Final     06/10/2014 05:23 Performed at [facility]    Culture   Final           BLOOD CULTURE RECEIVED NO GROWTH TO DATE CULTURE WILL BE HELD FOR 5 DAYS BEFORE ISSUING A FINAL NEGATIVE REPORT Performed at [facility]    Report Status PENDING  Incomplete  MRSA PCR Screening     Status: None   Collection Time: 06/10/14 12:39 AM  Result Value Ref Range Status   MRSA by PCR NEGATIVE NEGATIVE Final    Comment:        The GeneXpert MRSA Assay (FDA approved for NASAL specimens only), is one component of a comprehensive MRSA colonization surveillance program. It is not intended to diagnose MRSA infection nor to guide or monitor treatment for MRSA infections.      Studies: No results found.  Scheduled Meds: . aspirin EC  325 mg Oral Daily  . atorvastatin  10 mg Oral q1800  . fentaNYL  50 mcg Transdermal Q72H  . ferrous sulfate  325 mg Oral Q breakfast  . furosemide  20 mg Oral Daily  . guaiFENesin  600 mg Oral BID  . heparin subcutaneous  5,000 Units Subcutaneous 3 times per day  . levofloxacin  750 mg Oral Q48H  . metoprolol tartrate  12.5 mg Oral BID  . multivitamin with minerals  1 tablet Oral Daily  . omega-3 acid ethyl esters  1,000 mg Oral Daily  . pantoprazole  40 mg Oral Daily  . pregabalin  50 mg Oral Daily  . venlafaxine XR  75 mg Oral Q breakfast   Continuous Infusions:   Antibiotics Given (last 72 hours)    Date/Time Action Medication Dose   06/11/14 1446 Given   levofloxacin (LEVAQUIN) tablet 750 mg 750 mg   06/13/14 1608 Given   levofloxacin (LEVAQUIN) tablet 750 mg 750 mg     Time spent: 25  min  Jayah Balthazar L  [facility] Text page [email or web address], password [email or web address] 06/14/2014, 12:53 PM  LOS: 5 days

## 2014-06-15 MED ORDER — LEVOFLOXACIN 500 MG PO TABS: 500.0000 mg | ORAL_TABLET | Freq: Every day | ORAL | Status: DC

## 2014-06-15 NOTE — Clinical Social Work Note (Signed)
Clinical Social Worker attempted to contact the admissions office at [facility] and left a message in reference to patient's dc plans for today. CSW awaiting a returned phone call. CSW will follow pt for disposition.   Derenda FennelBashira Marykathleen Russi, MSW, LCSWA [phone] 06/15/2014 11:25 AM

## 2014-06-15 NOTE — Plan of Care (Signed)
Problem: Phase I Progression Outcomes Goal: Dyspnea controlled at rest Outcome: Completed/Met Date Met:  06/15/14 Goal: Pain controlled with appropriate interventions Outcome: Completed/Met Date Met:  06/15/14 Goal: OOB as tolerated unless otherwise ordered Outcome: Completed/Met Date Met:  06/15/14 Goal: First antibiotic given within 6hrs of admit Outcome: Completed/Met Date Met:  06/15/14 Goal: Voiding-avoid urinary catheter unless indicated Outcome: Completed/Met Date Met:  06/15/14 Goal: Other Phase I Outcomes/Goals Outcome: Not Applicable Date Met:  06/15/14  Problem: Phase II Progression Outcomes Goal: Encourage coughing & deep breathing Outcome: Completed/Met Date Met:  06/15/14 Goal: Pain controlled Outcome: Completed/Met Date Met:  06/15/14 Goal: Tolerating diet Outcome: Completed/Met Date Met:  06/15/14 Goal: Other Phase II Outcomes/Goals Outcome: Not Applicable Date Met:  06/15/14  Problem: Phase III Progression Outcomes Goal: Pain controlled on oral analgesia Outcome: Completed/Met Date Met:  06/15/14 Goal: Tolerating diet Outcome: Completed/Met Date Met:  06/15/14 Goal: Convert IV antibiotics to PO Outcome: Completed/Met Date Met:  06/15/14 Goal: Other Phase III Outcomes/Goals Outcome: Not Applicable Date Met:  06/15/14  Problem: Discharge Progression Outcomes Goal: Barriers To Progression Addressed/Resolved Outcome: Not Applicable Date Met:  06/15/14 Goal: Tolerating diet Outcome: Completed/Met Date Met:  06/15/14 Goal: Other Discharge Outcomes/Goals Outcome: Not Applicable Date Met:  06/15/14     

## 2014-06-15 NOTE — Progress Notes (Signed)
Subjective: Feels much better.  No PND, orthopnea.No CP  Objective: Vital signs in last 24 hours: Temp:  [97.6 F (36.4 C)-98.3 F (36.8 C)] 98.3 F (36.8 C) (12/07 0549) Pulse Rate:  [80-95] 91 (12/07 0549) Resp:  [15-16] 15 (12/07 0549) BP: (104-136)/(58-92) 113/62 mmHg (12/07 0549) SpO2:  [91 %-95 %] 94 % (12/07 0549) Weight:  [113 lb 1.6 oz (51.302 kg)] 113 lb 1.6 oz (51.302 kg) (12/07 0549) Last BM Date: 06/12/14  Intake/Output from previous day: 12/06 0701 - 12/07 0700 In: 350 [P.O.:350] Out: 550 [Urine:550] Intake/Output this shift: Total I/O In: 120 [P.O.:120] Out: 75 [Urine:75]  Medications Current Facility-Administered Medications  Medication Dose Route Frequency Provider Last Rate Last Dose  . acetaminophen (TYLENOL) tablet 650 mg  650 mg Oral Q6H PRN Eduard ClosArshad N Kakrakandy, MD   650 mg at 06/15/14 0557   Or  . acetaminophen (TYLENOL) suppository 650 mg  650 mg Rectal Q6H PRN Eduard ClosArshad N Kakrakandy, MD      . albuterol (PROVENTIL) (2.5 MG/3ML) 0.083% nebulizer solution 2.5 mg  2.5 mg Nebulization Q2H PRN Joseph ArtJessica U Vann, DO      . ALPRAZolam Prudy Feeler(XANAX) tablet 0.25 mg  0.25 mg Oral QHS PRN Eduard ClosArshad N Kakrakandy, MD      . aspirin EC tablet 325 mg  325 mg Oral Daily Eduard ClosArshad N Kakrakandy, MD   325 mg at 06/15/14 0943  . atorvastatin (LIPITOR) tablet 10 mg  10 mg Oral q1800 Lewayne BuntingBrian S Crenshaw, MD   10 mg at 06/14/14 1714  . fentaNYL (DURAGESIC - dosed mcg/hr) 50 mcg  50 mcg Transdermal Q72H Eduard ClosArshad N Kakrakandy, MD   50 mcg at 06/14/14 1009  . ferrous sulfate tablet 325 mg  325 mg Oral Q breakfast Eduard ClosArshad N Kakrakandy, MD   325 mg at 06/15/14 0847  . furosemide (LASIX) tablet 20 mg  20 mg Oral Daily Lewayne BuntingBrian S Crenshaw, MD   20 mg at 06/15/14 0942  . guaiFENesin (MUCINEX) 12 hr tablet 600 mg  600 mg Oral BID Joseph ArtJessica U Vann, DO   600 mg at 06/15/14 0943  . heparin injection 5,000 Units  5,000 Units Subcutaneous 3 times per day Lewayne BuntingBrian S Crenshaw, MD   5,000 Units at 06/14/14 2137  .  HYDROcodone-acetaminophen (NORCO/VICODIN) 5-325 MG per tablet 1-2 tablet  1-2 tablet Oral Q6H PRN Christiane Haorinna L Sullivan, MD   1 tablet at 06/15/14 [phone]  . levofloxacin (LEVAQUIN) tablet 750 mg  750 mg Oral Q48H Darl Householderlison M Masters, RPH   750 mg at 06/13/14 1608  . metoprolol tartrate (LOPRESSOR) tablet 12.5 mg  12.5 mg Oral BID Lewayne BuntingBrian S Crenshaw, MD   12.5 mg at 06/15/14 0943  . multivitamin with minerals tablet 1 tablet  1 tablet Oral Daily Eduard ClosArshad N Kakrakandy, MD   1 tablet at 06/15/14 [phone]  . nitroGLYCERIN (NITROSTAT) SL tablet 0.4 mg  0.4 mg Sublingual Q5 min PRN Eduard ClosArshad N Kakrakandy, MD      . omega-3 acid ethyl esters (LOVAZA) capsule 1,000 mg  1,000 mg Oral Daily Eduard ClosArshad N Kakrakandy, MD   1,000 mg at 06/15/14 0943  . ondansetron (ZOFRAN) tablet 4 mg  4 mg Oral Q6H PRN Eduard ClosArshad N Kakrakandy, MD       Or  . ondansetron [facility](ZOFRAN) injection 4 mg  4 mg Intravenous Q6H PRN Eduard ClosArshad N Kakrakandy, MD      . pantoprazole (PROTONIX) EC tablet 40 mg  40 mg Oral Daily Eduard ClosArshad N Kakrakandy, MD   40 mg  at 06/15/14 0942  . polyethylene glycol (MIRALAX / GLYCOLAX) packet 17 g  17 g Oral Daily PRN Eduard ClosArshad N Kakrakandy, MD   17 g at 06/15/14 0943  . pregabalin (LYRICA) capsule 50 mg  50 mg Oral Daily Eduard ClosArshad N Kakrakandy, MD   50 mg at 06/15/14 0942  . venlafaxine XR (EFFEXOR-XR) 24 hr capsule 75 mg  75 mg Oral Q breakfast Eduard ClosArshad N Kakrakandy, MD   75 mg at 06/15/14 0847  . zolpidem (AMBIEN) tablet 5 mg  5 mg Oral QHS PRN Eduard ClosArshad N Kakrakandy, MD   5 mg at 06/14/14 2159    PE: General appearance: alert, cooperative and no distress Lungs: Crackles on the right Heart: regular rate and rhythm, S1, S2 normal, no murmur, click, rub or gallop. JVP 9 Extremities: 1+ LEE Pulses: 2+ and symmetric Skin: Warm and dry Neurologic: Grossly normal  BMET  Recent Labs  06/13/14 0625 06/14/14 0634  NA 140 142  K 4.1 3.9  CL 100 99  CO2 26 30  GLUCOSE 93 88  BUN 21 20  CREATININE 0.68 0.75  CALCIUM 9.3 9.4     Assessment/Plan  1 non-ST elevation myocardial infarction - likely demand ischemia in the setting of pneumonia, will not pursue further ischemia evaluation and instead treat medically. Note she is a no CODE BLUE.  - Continue aspirin and statin. Continue low dose lopressor  2 pneumonia-continue antibiotics. 10 day course  3 history of atrial fibrillation-patient in sinus on exam. - continue ASA 4 chronic diastolic congestive heart failure - Chest xray with pulmonary edema and BNP elevated yesterday;  Net fluids:  Not charted. Await FU echo.(System still down)Back on home dose of lasix.  Being discharged today. She has a follow up appt with Dr. Jens Somrenshaw on 12/14 @ 9:30   LOS: 6 days    Wilburt FinlayHAGER, BRYAN PA-C 06/15/2014 11:31 AM  Patient seen and examined with Wilburt FinlayBryan Hager, PA-C. We discussed all aspects of the encounter. I agree with the assessment and plan as stated above.   She is much improved but volume status is mildly elevated. I have asked her to take an extra dose of lasix and KCL when she gets home today. I have also instructed her to take extra lasix any time her weight is 111 or greater. (home weight 108-110). Felt not to be candidate for anticoagulation.   Reuel BoomDaniel Bensimhon,MD 12:53 PM

## 2014-06-15 NOTE — Plan of Care (Signed)
Problem: Consults Goal: Pneumonia Patient Education See Patient Educatio Module for education specifics.  Outcome: Completed/Met Date Met:  06/15/14

## 2014-06-15 NOTE — Progress Notes (Signed)
Patient was discharged Independent Living by MD order; discharged instructions  review and give to patient and her daughter with care notes and prescriptions; IV DIC; skin intact; patient will be escorted to the car by a volunteer via wheelchair.

## 2014-06-15 NOTE — Progress Notes (Signed)
PT Cancellation Note  Patient Details Name: Julia Black MRN: [id number] DOB: 10/05/1918   Cancelled Treatment:    Reason Eval/Treat Not Completed: Other (comment); patient walking in hallway with daughter.  Report ready for d/c back to [facility] with PT following there and they will provide assist in her home there.  No further acute PT needs at this time.   WYNN,CYNDI 06/15/2014, 11:10 AM

## 2014-06-15 NOTE — Discharge Summary (Signed)
Physician Discharge Summary  Julia Black A Milazzo BMW:[id number] DOB: 11/22/1918 DOA: 06/09/2014  PCP: Rene PaciValerie Leschber, MD  Admit date: 06/09/2014 Discharge date: 06/15/2014  Time spent: Greater than 30 minutes.  Recommendations for Outpatient Follow-up:  1. Follow-up echocardiogram 2. Home physical therapy, nurse, aide have been arranged  Discharge Diagnoses:  Principal Problem:   Legionella pneumonia Active Problems:   Essential hypertension   Macular degeneration   Acute respiratory failure   Acute on chronic diastolic heart failure   Hyperglycemia   NSTEMI (non-ST elevated myocardial infarction)   Discharge Condition: stable  Filed Weights   06/13/14 0500 06/14/14 0513 06/15/14 0549  Weight: 52.164 kg (115 lb) 50.894 kg (112 lb 3.2 oz) 51.302 kg (113 lb 1.6 oz)    History of present illness:  80 y.o. female with history of CAD status post CABG, diastolic CHF last EF measured in September 2014 was 60-65%, hyperlipidemia presents to the ER from the nursing home because of shortness of breath and productive cough. Patient states that she has been having productive cough for last 2 days and became short of breath today suddenly. Patient also was found to have fever chills. In the ER patient was found to be febrile with blood work showing leukocytosis and chest x-ray showing congestion. On exam patient does have left lower lung field crepitations. Patient's troponin was found to be elevated and on-call cardiologist was consulted. EKG was showing sinus tachycardia. Patient initially was given Lasix 1 dose for CHF. Cardiologist at this time feels that patient's decompensated CHF and elevated troponin probably secondary to patient's pneumonia causing demand ischemia and decompensated CHF. Patient otherwise denies any nausea vomiting abdominal pain. Patient denies any chest pain though she does have some chest tightness. No chest pain. EKG showed new flipped T waves laterally.  Hospital Course:   Admitted to tele. Started on Rocephin, azithromycin, diuretics.  Ruled in for NSTEMI. Cardiology consulted. Was started on heparin drip. Aspirin, beta blocker, statin continued. Urine legionella antigen was positive.  Antibiotics changed to Levaquin. MI was felt to be secondary to demand ischemia in the setting of acute pneumonia. Echocardiogram has been done, but the system is down so it has not been read. Cardiology has deferred catheterization. CODE STATUS is DO NOT RESUSCITATE. By discharge, cough, dyspnea much improved. She is able to ambulate with assistance down the hall. She is euvolemic and back on her home dose of diuretic. She had mildly increased LFTs which may be related to legionella pneumonia. Patient is from independent living facility. There've been no other cases of legionella pneumonia there. Physical therapy has recommended intermittent supervision for ambulation as well as home physical therapy. We have arranged a nurse and an aide as well. Family reports they will be available to stay with patient for the next few weeks. She will need follow-up of her echocardiogram report which is pending. She has been cleared by cardiology for discharge.  Procedures:  None  Consultations:  [facility]  Discharge Exam: Filed Vitals:   06/15/14 0549  BP: 113/62  Pulse: 91  Temp: 98.3 F (36.8 C)  Resp: 15    General: Comfortable. Alert oriented. Breathing nonlabored. Cardiovascular: Regular rate rhythm without murmurs gallops rubs Respiratory: Clear to auscultation bilaterally without wheezes rhonchi or rales Abdomen soft nontender nondistended Extremities no clubbing cyanosis or edema  Discharge Instructions    Diet - low sodium heart healthy    Complete by:  As directed      Walk with  assistance    Complete by:  As directed      Walker     Complete by:  As directed           Current Discharge Medication List    START taking these medications    Details  levofloxacin (LEVAQUIN) 500 MG tablet Take 1 tablet (500 mg total) by mouth daily. Qty: 4 tablet, Refills: 0      CONTINUE these medications which have NOT CHANGED   Details  ALPRAZolam (XANAX) 0.25 MG tablet TAKE ONE TABLET AT BEDTIME AS NEEDED ONLY FOR SLEEP OR ANXIETY Qty: 30 tablet, Refills: 1    Ascorbic Acid (VITAMIN C) 500 MG tablet Take 1,000 mg by mouth daily.     aspirin 81 MG tablet Take 81 mg by mouth at bedtime.     Cholecalciferol (VITAMIN D3) 1000 UNITS CAPS Take 1 capsule by mouth daily.     DOK 100 MG capsule TAKE ONE CAPSULE TWICE A DAY Qty: 60 capsule, Refills: 11    fentaNYL (DURAGESIC) 50 MCG/HR Place 1 patch (50 mcg total) onto the skin every 3 (three) days. Qty: 10 patch, Refills: 0    ferrous sulfate 325 (65 FE) MG tablet Take 325 mg by mouth daily with breakfast.    furosemide (LASIX) 20 MG tablet 1 TABLET DAILY Qty: 30 tablet, Refills: 11   Associated Diagnoses: Atrial fibrillation    HYDROcodone-acetaminophen (NORCO/VICODIN) 5-325 MG per tablet Take 1 tablet by mouth every 6 (six) hours as needed. Qty: 120 tablet, Refills: 0    LYRICA 50 MG capsule TAKE ONE CAPSULE THREE TIMES DAILY Qty: 90 capsule, Refills: 5    metoprolol tartrate (LOPRESSOR) 25 MG tablet TAKE 1/2 TABLET TWICE DAILY Qty: 30 tablet, Refills: 5    Multiple Vitamin (MULTIVITAMIN WITH MINERALS) TABS Take 1 tablet by mouth daily.    nitroGLYCERIN (NITROSTAT) 0.4 MG SL tablet Place 1 tablet (0.4 mg total) under the tongue every 5 (five) minutes as needed for chest pain (upt o 3 doses). Qty: 25 tablet, Refills: 4    Omega-3 Fatty Acids (FISH OIL) 1000 MG CAPS Take 1 capsule by mouth daily.     pantoprazole (PROTONIX) 40 MG tablet TAKE ONE TABLET BY MOUTH ONCE DAILY Qty: 30 tablet, Refills: 4    polyethylene glycol (MIRALAX / GLYCOLAX) packet Take 17 g by mouth daily as needed (constipation).     potassium chloride SA (K-DUR,KLOR-CON) 20 MEQ tablet Take 20 mEq by mouth  daily.    pravastatin (PRAVACHOL) 40 MG tablet Take 1 tablet (40 mg total) by mouth every evening. Qty: 90 tablet, Refills: 3   Associated Diagnoses: Other and unspecified hyperlipidemia    Probiotic Product (PROBIOTIC DAILY PO) Take 1 each by mouth daily as needed (for GI tract).    venlafaxine XR (EFFEXOR-XR) 75 MG 24 hr capsule TAKE ONE CAPSULE EACH DAY Qty: 30 capsule, Refills: 5    zolpidem (AMBIEN) 5 MG tablet Take 5 mg by mouth at bedtime as needed for sleep.      STOP taking these medications     simvastatin (ZOCOR) 40 MG tablet        Allergies  Allergen Reactions  . Amoxicillin Nausea Only    Upset stomach with high dose  . Sporanox [Itraconazole] Hives  . Sulfa Antibiotics Nausea Only   Follow-up Information    Follow up with Rene PaciValerie Leschber, MD In 4 weeks.   Specialty:  Internal Medicine   Contact information:   [address]  [address] [place] [address] [phone]        The results of significant diagnostics from this hospitalization (including imaging, microbiology, ancillary and laboratory) are listed below for reference.    Significant Diagnostic Studies: Dg Chest 2 View  06/09/2014   CLINICAL DATA:  Cough, shortness of breath, prior coronary bypass .  EXAM: CHEST - 2 VIEW  COMPARISON:  09/03/2013  FINDINGS: diffuse increased vascular and interstitial opacities throughout the lungs compatible with edema. Heart is enlarged. Findings compatible with mild CHF superimposed on background COPD/ emphysema. Lungs are hyperinflated. Minor associated basilar atelectasis. No large effusion or pneumothorax. Trachea midline. Atherosclerosis of the aorta. Bones are osteopenic. Degenerative changes of the spine with an associated scoliosis. Chronic mid thoracic compression fracture deformity.  IMPRESSION: Mild CHF pattern superimposed on COPD/emphysema   Electronically Signed   By: Ruel Favors M.D.   On: 06/09/2014 18:10   Dg Chest 2v Repeat  Same Day  06/12/2014   CLINICAL DATA:  Shortness of breath and wheezing for 6 days.  EXAM: CHEST - 2 VIEW SAME DAY  COMPARISON:  PA and lateral chest 06/09/2014.  FINDINGS: The lungs are emphysematous. There is cardiomegaly. The patient is status post CABG. Pulmonary edema persists without marked change. Small bilateral pleural effusions are identified and slightly increased. No pneumothorax.  IMPRESSION: Slight increase in small bilateral pleural effusions.  Cardiomegaly and pulmonary edema.  Emphysema.   Electronically Signed   By: Drusilla Kanner M.D.   On: 06/12/2014 07:48   Echocardiogram pending  EKG Sinus tachycardia with 1st degree A-V block with Premature supraventricular complexes LVH with repolarization abnormality, cannot exclude ischemia Since previous EKG ST changes are new  Microbiology: Recent Results (from the past 240 hour(s))  Culture, blood (routine x 2)     Status: None (Preliminary result)   Collection Time: 06/09/14  6:07 PM  Result Value Ref Range Status   Specimen Description BLOOD RIGHT ARM  Final   Special Requests BOTTLES DRAWN AEROBIC AND ANAEROBIC 5CC  Final   Culture  Setup Time   Final    06/10/2014 05:24 Performed at [facility]    Culture   Final           BLOOD CULTURE RECEIVED NO GROWTH TO DATE CULTURE WILL BE HELD FOR 5 DAYS BEFORE ISSUING A FINAL NEGATIVE REPORT Performed at [facility]    Report Status PENDING  Incomplete  Culture, blood (routine x 2)     Status: None (Preliminary result)   Collection Time: 06/09/14  9:25 PM  Result Value Ref Range Status   Specimen Description BLOOD HAND RIGHT  Final   Special Requests   Final    BOTTLES DRAWN AEROBIC AND ANAEROBIC 10CC PT ON ROCEPHIN, ARITHROMYCIN   Culture  Setup Time   Final    06/10/2014 05:23 Performed at [facility]    Culture   Final           BLOOD CULTURE RECEIVED NO GROWTH TO DATE CULTURE WILL BE HELD FOR 5 DAYS BEFORE ISSUING A FINAL NEGATIVE  REPORT Performed at [facility]    Report Status PENDING  Incomplete  MRSA PCR Screening     Status: None   Collection Time: 06/10/14 12:39 AM  Result Value Ref Range Status   MRSA by PCR NEGATIVE NEGATIVE Final    Comment:        The GeneXpert MRSA Assay (FDA approved for NASAL specimens only), is  one component of a comprehensive MRSA colonization surveillance program. It is not intended to diagnose MRSA infection nor to guide or monitor treatment for MRSA infections.      Labs: Basic Metabolic Panel:  Recent Labs Lab 06/10/14 0337 06/11/14 0533 06/12/14 9562 06/13/14 0625 06/14/14 0634  NA 140 136* 137 140 142  K 4.4 4.9 4.2 4.1 3.9  CL 102 100 99 100 99  CO2 23 24 25 26 30   GLUCOSE 234* 85 93 93 88  BUN 30* 32* 19 21 20   CREATININE 1.07 0.78 0.60 0.68 0.75  CALCIUM 8.5 8.7 9.1 9.3 9.4   Liver Function Tests:  Recent Labs Lab 06/09/14 1655 06/10/14 0337 06/11/14 0533 06/14/14 0634  AST 47* 53* 65* 44*  ALT 36* 34 39* 49*  ALKPHOS 206* 166* 167* 170*  BILITOT 0.9 0.3 0.3 0.5  PROT 7.1 6.7 6.0 6.3  ALBUMIN 3.2* 2.8* 2.5* 2.6*   No results for input(s): LIPASE, AMYLASE in the last 168 hours. No results for input(s): AMMONIA in the last 168 hours. CBC:  Recent Labs Lab 06/09/14 1655 06/10/14 0337 06/11/14 0533 06/12/14 0638  WBC 13.7* 11.3* 9.5 7.5  NEUTROABS 11.3* 10.3*  --   --   HGB 13.4 12.3 11.7* 13.4  HCT 42.6 38.2 37.1 42.7  MCV 94.7 94.3 93.9 94.3  PLT 176 140* 146* 146*   Cardiac Enzymes:  Recent Labs Lab 06/10/14 0020 06/10/14 0905 06/10/14 1234  TROPONINI 5.11* 6.53* 6.06*   BNP: BNP (last 3 results)  Recent Labs  09/03/13 1235 06/09/14 1711 06/12/14 0638  PROBNP 929.0* 2199.0* 31241.0*   CBG:  Recent Labs Lab 06/10/14 1159 06/10/14 1607 06/10/14 2000 06/11/14 0750 06/11/14 0908  GLUCAP 97 103* 102* 63* 52*       Signed:  Carri Spillers L  [facility] 06/15/2014, 10:47 AM

## 2014-06-15 NOTE — Plan of Care (Signed)
Problem: Consults Goal: Skin Care Protocol Initiated - if Braden Score 18 or less If consults are not indicated, leave blank or document N/A  Outcome: Not Applicable Date Met:  93/73/42 Goal: Nutrition Consult-if indicated Outcome: Not Applicable Date Met:  87/68/11 Goal: Diabetes Guidelines if Diabetic/Glucose > 140 If diabetic or lab glucose is > 140 mg/dl - Initiate Diabetes/Hyperglycemia Guidelines & Document Interventions  Outcome: Not Applicable Date Met:  57/26/20  Problem: Phase I Progression Outcomes Goal: Confirm chest x-ray completed Outcome: Completed/Met Date Met:  06/15/14 Goal: Consider Infectious Disease Consult Outcome: Not Applicable Date Met:  35/59/74 Goal: Consider pulmonary consult Outcome: Not Applicable Date Met:  16/38/45 Goal: Code status addressed with pt/family Outcome: Not Applicable Date Met:  36/46/80 Goal: Initial discharge plan identified Outcome: Completed/Met Date Met:  06/15/14 Goal: Hemodynamically stable Outcome: Completed/Met Date Met:  06/15/14  Problem: Phase II Progression Outcomes Goal: Wean O2 if indicated Outcome: Not Applicable Date Met:  32/12/24 Goal: Review culture results with MD if needed Outcome: Not Applicable Date Met:  82/50/03 Goal: Progress activity as tolerated unless otherwise ordered Outcome: Completed/Met Date Met:  06/15/14 Goal: Discharge plan established Outcome: Completed/Met Date Met:  06/15/14 Goal: If HF, initiate HF Core Reminder Form Outcome: Not Applicable Date Met:  70/48/88  Problem: Phase III Progression Outcomes Goal: O2 sats > or equal to 93% on room air Outcome: Completed/Met Date Met:  06/15/14 Goal: Activity at appropriate level-compared to baseline (UP IN CHAIR FOR HEMODIALYSIS)  Outcome: Completed/Met Date Met:  06/15/14 Goal: Discharge plan remains appropriate-arrangements made Outcome: Completed/Met Date Met:  06/15/14  Problem: Discharge Progression Outcomes Goal: Discharge plan in place  and appropriate Outcome: Completed/Met Date Met:  06/15/14 Goal: O2 sats at patient's baseline Outcome: Completed/Met Date Met:  06/15/14 Goal: Pain controlled with appropriate interventions Outcome: Completed/Met Date Met:  06/15/14 Goal: Hemodynamically stable Outcome: Completed/Met Date Met:  91/69/45 Goal: Complications resolved/controlled Outcome: Not Applicable Date Met:  03/88/82 Goal: Activity appropriate for discharge plan Outcome: Completed/Met Date Met:  06/15/14 Goal: Vaccine documented on D/C instructions Outcome: Not Applicable Date Met:  80/03/49

## 2014-06-15 NOTE — Plan of Care (Signed)
Problem: Phase II Progression Outcomes Goal: Other Phase II Outcomes/Goals Outcome: Not Applicable Date Met:  64/31/42  Problem: Phase III Progression Outcomes Goal: Pain controlled on oral analgesia Outcome: Completed/Met Date Met:  06/15/14 Goal: Activity at appropriate level-compared to baseline (UP IN CHAIR FOR HEMODIALYSIS)  Outcome: Completed/Met Date Met:  06/15/14 Goal: Tolerating diet Outcome: Completed/Met Date Met:  06/15/14 Goal: Other Phase III Outcomes/Goals Outcome: Not Applicable Date Met:  76/70/11  Problem: Discharge Progression Outcomes Goal: Barriers To Progression Addressed/Resolved Outcome: Not Applicable Date Met:  00/34/96 Goal: Complications resolved/controlled Outcome: Not Applicable Date Met:  11/64/35 Goal: Tolerating diet Outcome: Completed/Met Date Met:  06/15/14 Goal: Other Discharge Outcomes/Goals Outcome: Not Applicable Date Met:  39/12/25

## 2014-06-15 NOTE — Progress Notes (Signed)
Medicare Important Message given?  YES (If response is "NO", the following Medicare IM given date fields will be blank) Date Medicare IM given:  06/15/14 Medicare IM given by:  Tranesha Lessner 

## 2014-06-16 LAB — CULTURE, BLOOD (ROUTINE X 2)
CULTURE: NO GROWTH
Culture: NO GROWTH

## 2014-06-20 NOTE — Progress Notes (Signed)
HPI: FU CAD s/p CABG in 2004, CHF, carotid stenosis, HTN, HL, atrial fibrillation. She was admitted in 02/2013 with elevated troponin in the setting of CHF. EF was 25-30% with anteroseptal and apical AK on echo. This was felt to represent stress-induced cardiomyopathy. Conservative measures were felt to be appropriate if possible given advanced age. She improved with medical Rx. Myoview 02/2013 was low risk without ischemia. She did have evidence of anterior scar. EF was 49%. Follow up echo in 03/2013 demonstrated normal LV function with moderate diastolic dysfunction, mild to moderate MR with bileaflet mitral valve prolapse and biatrial enlargement. She is not anticoagulated for atrial fibrillation due to history of fall risk. Admitted 12/15 with pneumonia and NSTEMI; treated conservatively for MI at her request. Since DC, she has mild dyspnea on exertion but back to baseline. No orthopnea, PND, pedal edema, chest pain or syncope.  Current Outpatient Prescriptions  Medication Sig Dispense Refill  . ALPRAZolam (XANAX) 0.25 MG tablet TAKE ONE TABLET AT BEDTIME AS NEEDED ONLY FOR SLEEP OR ANXIETY 30 tablet 1  . Ascorbic Acid (VITAMIN C) 500 MG tablet Take 1,000 mg by mouth daily.     [name]. aspirin 81 MG tablet Take 81 mg by mouth at bedtime.     . Cholecalciferol (VITAMIN D3) 1000 UNITS CAPS Take 1 capsule by mouth daily.     [name]. DOK 100 MG capsule TAKE ONE CAPSULE TWICE A DAY 60 capsule 11  . fentaNYL (DURAGESIC) 50 MCG/HR Place 1 patch (50 mcg total) onto the skin every 3 (three) days. 10 patch 0  . ferrous sulfate 325 (65 FE) MG tablet Take 325 mg by mouth daily with breakfast.    . furosemide (LASIX) 20 MG tablet 1 TABLET DAILY (Patient taking differently: Take 20 mg by mouth daily. ) 30 tablet 11  . HYDROcodone-acetaminophen (NORCO/VICODIN) 5-325 MG per tablet Take 1 tablet by mouth every 6 (six) hours as needed. (Patient taking differently: Take 1 tablet by mouth every 6 (six) hours as needed  (pain). ) 120 tablet 0  . levofloxacin (LEVAQUIN) 500 MG tablet Take 1 tablet (500 mg total) by mouth daily. 4 tablet 0  . LYRICA 50 MG capsule TAKE ONE CAPSULE THREE TIMES DAILY 90 capsule 5  . metoprolol tartrate (LOPRESSOR) 25 MG tablet TAKE 1/2 TABLET TWICE DAILY 30 tablet 5  . Multiple Vitamin (MULTIVITAMIN WITH MINERALS) TABS Take 1 tablet by mouth daily.    . nitroGLYCERIN (NITROSTAT) 0.4 MG SL tablet Place 1 tablet (0.4 mg total) under the tongue every 5 (five) minutes as needed for chest pain (upt o 3 doses). 25 tablet 4  . Omega-3 Fatty Acids (FISH OIL) 1000 MG CAPS Take 1 capsule by mouth daily.     . pantoprazole (PROTONIX) 40 MG tablet TAKE ONE TABLET BY MOUTH ONCE DAILY 30 tablet 4  . polyethylene glycol (MIRALAX / GLYCOLAX) packet Take 17 g by mouth daily as needed (constipation).     . potassium chloride SA (K-DUR,KLOR-CON) 20 MEQ tablet Take 20 mEq by mouth daily.    . pravastatin (PRAVACHOL) 40 MG tablet Take 1 tablet (40 mg total) by mouth every evening. 90 tablet 3  . Probiotic Product (PROBIOTIC DAILY PO) Take 1 each by mouth daily as needed (for GI tract).    . venlafaxine XR (EFFEXOR-XR) 75 MG 24 hr capsule TAKE ONE CAPSULE EACH DAY 30 capsule 5  . zolpidem (AMBIEN) 5 MG tablet Take 5 mg by mouth at bedtime as needed  for sleep.     No current facility-administered medications for this visit.     Past Medical History  Diagnosis Date  . SPINAL STENOSIS   . LOW BACK PAIN     chronic, follows with Nsurg for ESI  . FRACTURE, PELVIS, RIGHT 12/2009  . HIP FRACTURE, LEFT 09/2009    s/p L THA  . ALLERGIC RHINITIS   . Carpal tunnel syndrome   . Hypertension   . HYPERLIPIDEMIA   . GERD   . Arthritis   . Osteoporosis   . Atrial fibrillation     no anticoag due to fall risk/age  . DEPRESSION   . CORONARY ARTERY DISEASE 2004    a.  LHC (01/2003):  3 v CAD => s/p CABG.;   b.  Myoview (02/2013):  Anterior scar; no ischemia; EF 49%.    . Systolic congestive heart failure   .  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 Echo (02/2013):  EF 25-30%.;   b.  Echo (04/04/13):  Mild focal basal septal hypertrophy, EF 60-65%, no RWMA, Gr 2 DD, mild to mod MAC, mild bileaflet MVP, mild to mod MR, mild to mod LAE, mild RAE, PASP 48.  [name]. H/O Doppler ultrasound     Renal Artery US (09/2010):  No RAS.  . Carotid artery occlusion     Carotid US (05/2010):  RICA 70-99%; LICA 0-49%  . Anemia     Past Surgical History  Procedure Laterality Date  . (l) hip hemiarthroplasty  09/2009  . Appendectomy  1940  . Eye surgery      both cataracts  . Coronary artery bypass graft  2004  . Carpal tunnel release  2004    right and left-dsc  . Ulnar nerve transposition  2009    lt   . Vein ligation      legs  . Colonoscopy    . I&d extremity Right 12/26/2012    Procedure: IRRIGATION AND DEBRIDEMENT OF RIGHT LEG, SURGICAL PREP PLACEMENT OF A CELL AND VAC   ;  Surgeon: Wayland Denislaire Sanger, DO;  Location: [facility];  Service: Plastics;  Laterality: Right;    History   Social History  . Marital Status: Divorced    Spouse Name: N/A    Number of Children: N/A  . Years of Education: N/A   Occupational History  . Not on file.   Social History Main Topics  . Smoking status: Former Smoker    Quit date: 07/10/1978  . Smokeless tobacco: Never Used     Comment: Lives alone in indep living facility @ [place]. Active ADLS but does not drive dur to macular degen. supportive family members nearby  . Alcohol Use: No  . Drug Use: No  . Sexual Activity: No   Other Topics Concern  . Not on file   Social History Narrative   ACP - HCPOA [name] (c) [phone]; No CPR,  No mechanical ventilation.     ROS: no fevers or chills, productive cough, hemoptysis, dysphasia, odynophagia, melena, hematochezia, dysuria, hematuria, rash, seizure activity, orthopnea, PND, pedal edema, claudication. Remaining systems are negative.  Physical Exam: Well-developed frail in no acute  distress.  Skin is warm and dry.  HEENT is normal.  Neck is supple.  Chest is clear to auscultation with normal expansion.  Cardiovascular exam is regular rate and rhythm.  Abdominal exam nontender or distended. No masses palpated. Extremities show no edema. neuro grossly intact

## 2014-06-22 ENCOUNTER — Encounter: Payer: Self-pay | Admitting: Cardiology

## 2014-06-22 ENCOUNTER — Ambulatory Visit (INDEPENDENT_AMBULATORY_CARE_PROVIDER_SITE_OTHER): Payer: Medicare Other | Admitting: Cardiology

## 2014-06-22 ENCOUNTER — Other Ambulatory Visit: Payer: Self-pay | Admitting: Internal Medicine

## 2014-06-22 VITALS — BP 112/60 | HR 60 | Ht 62.0 in | Wt 111.3 lb

## 2014-06-22 DIAGNOSIS — I480 Paroxysmal atrial fibrillation: Secondary | ICD-10-CM

## 2014-06-22 DIAGNOSIS — I10 Essential (primary) hypertension: Secondary | ICD-10-CM

## 2014-06-22 DIAGNOSIS — I679 Cerebrovascular disease, unspecified: Secondary | ICD-10-CM

## 2014-06-22 DIAGNOSIS — I5023 Acute on chronic systolic (congestive) heart failure: Secondary | ICD-10-CM

## 2014-06-22 DIAGNOSIS — I2510 Atherosclerotic heart disease of native coronary artery without angina pectoris: Secondary | ICD-10-CM

## 2014-06-22 NOTE — Assessment & Plan Note (Signed)
Continue aspirin and statin. I discussed with patient previously. Given age we will not pursue further carotid Dopplers and she is in agreement.

## 2014-06-22 NOTE — Assessment & Plan Note (Signed)
Patient remains in sinus rhythm on examination. Continue aspirin. Continue metoprolol. Not on Coumadin given fall risk.

## 2014-06-22 NOTE — Assessment & Plan Note (Signed)
Continue aspirin and statin. Patient wants only conservative measures. She did rule in for an infarct recently in the setting of pneumonia. She had an echocardiogram. We will try and obtain the results.

## 2014-06-22 NOTE — Assessment & Plan Note (Signed)
Blood pressure control. Continue present medications. 

## 2014-06-22 NOTE — Patient Instructions (Signed)
Your physician recommends that you schedule a follow-up appointment in: 3 MONTHS WITH DR CRENSHAW  

## 2014-06-22 NOTE — Assessment & Plan Note (Signed)
Continue statin. 

## 2014-06-22 NOTE — Assessment & Plan Note (Signed)
Patient's volume status is much improved. Continue present dose of Lasix. Take an additional 20 mg daily as needed for weight gain of 2 pounds or edema. Low-sodium diet.

## 2014-06-23 ENCOUNTER — Telehealth: Payer: Self-pay | Admitting: Internal Medicine

## 2014-06-23 NOTE — Telephone Encounter (Signed)
emmi emailed °

## 2014-07-13 ENCOUNTER — Ambulatory Visit: Payer: Medicare Other | Admitting: Internal Medicine

## 2014-07-13 ENCOUNTER — Other Ambulatory Visit: Payer: Self-pay | Admitting: Internal Medicine

## 2014-07-13 DIAGNOSIS — R4689 Other symptoms and signs involving appearance and behavior: Secondary | ICD-10-CM | POA: Insufficient documentation

## 2014-07-15 ENCOUNTER — Other Ambulatory Visit: Payer: Medicare Other

## 2014-07-15 ENCOUNTER — Ambulatory Visit (INDEPENDENT_AMBULATORY_CARE_PROVIDER_SITE_OTHER): Payer: Medicare Other | Admitting: Family

## 2014-07-15 ENCOUNTER — Encounter: Payer: Self-pay | Admitting: Family

## 2014-07-15 VITALS — BP 140/82 | HR 82 | Temp 97.8°F | Resp 18 | Ht 62.0 in | Wt 111.1 lb

## 2014-07-15 DIAGNOSIS — R300 Dysuria: Secondary | ICD-10-CM | POA: Insufficient documentation

## 2014-07-15 DIAGNOSIS — R350 Frequency of micturition: Secondary | ICD-10-CM

## 2014-07-15 DIAGNOSIS — A481 Legionnaires' disease: Secondary | ICD-10-CM

## 2014-07-15 LAB — POCT URINALYSIS DIPSTICK
Bilirubin, UA: NEGATIVE
Blood, UA: NEGATIVE
GLUCOSE UA: NEGATIVE
Ketones, UA: NEGATIVE
LEUKOCYTES UA: NEGATIVE
NITRITE UA: NEGATIVE
PH UA: 6.5
Protein, UA: NEGATIVE
Spec Grav, UA: 1.015
Urobilinogen, UA: 4

## 2014-07-15 MED ORDER — CIPROFLOXACIN HCL 250 MG PO TABS: 250.0000 mg | ORAL_TABLET | Freq: Two times a day (BID) | ORAL | Status: DC

## 2014-07-15 NOTE — Progress Notes (Signed)
Subjective:    Patient ID: Julia Black, female    DOB: 1919-01-11, 81 y.o.   MRN: [id number]  Chief Complaint  Patient presents with  . Follow-up    from the hospital had pneumonia, says doing better, brought in a urine specimen says she thinks she has a uti.    HPI:  Julia Black is a 81 y.o. female who presents today for a hospital follow up and discuss a possible UTI.   Patient was recently admitted to the hospital with shortness of breath and productive cough.  She was also noted to have fever and chills. Workup showed leukocytosis and chest x-ray revealed pneumonia. It was determined she had legionnaires pneumonia. She was treated with multiple antibiotics and was also noted to have a potential N-STEMI. Since discharge she has been attending physical therapy. Hospital records were reviewed in detail.  Patient presents today for follow-up of that hospitalization. Indicates that she is feeling fine with no residual symptoms of the pneumonia that she notes. She does continue to work with physical therapy to increase her strength at this time. Otherwise she denies any fevers, chills, shortness of breath, or cough.   2) Urinary frequency started about 2 days ago. Indicates she does have some dysuria and may have some costovertebral tenderness on the left side. Denies fevers.   Allergies  Allergen Reactions  . Amoxicillin Nausea Only    Upset stomach with high dose  . Sporanox [Itraconazole] Hives  . Sulfa Antibiotics Nausea Only    Current Outpatient Prescriptions on File Prior to Visit  Medication Sig Dispense Refill  . ALPRAZolam (XANAX) 0.25 MG tablet Take 1 tablet (0.25 mg total) by mouth at bedtime as needed for sleep. 30 tablet 5  . Ascorbic Acid (VITAMIN C) 500 MG tablet Take 1,000 mg by mouth daily.     [name] aspirin 81 MG tablet Take 81 mg by mouth at bedtime.     . Cholecalciferol (VITAMIN D3) 1000 UNITS CAPS Take 1 capsule by mouth daily.     [name] DOK 100 MG capsule TAKE ONE  CAPSULE TWICE A DAY 60 capsule 11  . fentaNYL (DURAGESIC) 50 MCG/HR Place 1 patch (50 mcg total) onto the skin every 3 (three) days. 10 patch 0  . ferrous sulfate 325 (65 FE) MG tablet Take 325 mg by mouth daily with breakfast.    . furosemide (LASIX) 20 MG tablet 1 TABLET DAILY (Patient taking differently: Take 20 mg by mouth daily. ) 30 tablet 11  . HYDROcodone-acetaminophen (NORCO/VICODIN) 5-325 MG per tablet Take 1 tablet by mouth every 6 (six) hours as needed. (Patient taking differently: Take 1 tablet by mouth every 6 (six) hours as needed (pain). ) 120 tablet 0  . levofloxacin (LEVAQUIN) 500 MG tablet Take 1 tablet (500 mg total) by mouth daily. 4 tablet 0  . LYRICA 50 MG capsule TAKE ONE CAPSULE THREE TIMES DAILY 90 capsule 5  . metoprolol tartrate (LOPRESSOR) 25 MG tablet TAKE 1/2 TABLET TWICE DAILY 30 tablet 5  . Multiple Vitamin (MULTIVITAMIN WITH MINERALS) TABS Take 1 tablet by mouth daily.    . nitroGLYCERIN (NITROSTAT) 0.4 MG SL tablet Place 1 tablet (0.4 mg total) under the tongue every 5 (five) minutes as needed for chest pain (upt o 3 doses). 25 tablet 4  . Omega-3 Fatty Acids (FISH OIL) 1000 MG CAPS Take 1 capsule by mouth daily.     . pantoprazole (PROTONIX) 40 MG tablet TAKE ONE TABLET BY MOUTH ONCE  DAILY 30 tablet 4  . polyethylene glycol (MIRALAX / GLYCOLAX) packet Take 17 g by mouth daily as needed (constipation).     . potassium chloride SA (K-DUR,KLOR-CON) 20 MEQ tablet Take 20 mEq by mouth daily.    . pravastatin (PRAVACHOL) 40 MG tablet Take 1 tablet (40 mg total) by mouth every evening. 90 tablet 3  . Probiotic Product (PROBIOTIC DAILY PO) Take 1 each by mouth daily as needed (for GI tract).    . venlafaxine XR (EFFEXOR-XR) 75 MG 24 hr capsule TAKE ONE CAPSULE EACH DAY 30 capsule 5  . zolpidem (AMBIEN) 5 MG tablet Take 5 mg by mouth at bedtime as needed for sleep.     No current facility-administered medications on file prior to visit.    Past Medical History    Diagnosis Date  . SPINAL STENOSIS   . LOW BACK PAIN     chronic, follows with Nsurg for ESI  . FRACTURE, PELVIS, RIGHT 12/2009  . HIP FRACTURE, LEFT 09/2009    s/p L THA  . ALLERGIC RHINITIS   . Carpal tunnel syndrome   . Hypertension   . HYPERLIPIDEMIA   . GERD   . Arthritis   . Osteoporosis   . Atrial fibrillation     no anticoag due to fall risk/age  . DEPRESSION   . CORONARY ARTERY DISEASE 2004    a.  LHC (01/2003):  3 v CAD => s/p CABG.;   b.  Myoview (02/2013):  Anterior scar; no ischemia; EF 49%.    . Systolic congestive heart failure   . 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 Echo (02/2013):  EF 25-30%.;   b.  Echo (04/04/13):  Mild focal basal septal hypertrophy, EF 60-65%, no RWMA, Gr 2 DD, mild to mod MAC, mild bileaflet MVP, mild to mod MR, mild to mod LAE, mild RAE, PASP 48.  [name]. H/O Doppler ultrasound     Renal Artery US (09/2010):  No RAS.  . Carotid artery occlusion     Carotid US (05/2010):  RICA 70-99%; LICA 0-49%  . Anemia     Review of Systems  Constitutional: Negative for fever and chills.  Respiratory: Negative for cough, chest tightness and shortness of breath.   Genitourinary: Positive for dysuria and frequency. Negative for urgency.      Objective:    BP 140/82 mmHg  Pulse 82  Temp(Src) 97.8 F (36.6 C) (Oral)  Resp 18  Ht 5\' 2"  (1.575 m)  Wt 111 lb 1.9 oz (50.404 kg)  BMI 20.32 kg/m2  SpO2 86% Nursing note and vital signs reviewed.  Physical Exam  Constitutional: She is oriented to person, place, and time. She appears well-developed and well-nourished. No distress.  Cardiovascular: Normal rate, regular rhythm, normal heart sounds and intact distal pulses.   Pulmonary/Chest: Effort normal and breath sounds normal.  Abdominal: There is CVA tenderness.  Neurological: She is alert and oriented to person, place, and time.  Skin: Skin is warm and dry.  Psychiatric: She has a normal mood and affect. Her behavior is normal. Judgment and  thought content normal.       Assessment & Plan:

## 2014-07-15 NOTE — Progress Notes (Signed)
Pre visit review using our clinic review tool, if applicable. No additional management support is needed unless otherwise documented below in the visit note. 

## 2014-07-15 NOTE — Assessment & Plan Note (Signed)
Symptoms consistent with urinary tract infection. In office urinalysis reveals no leukocytes or nitrites. Will treat for urinary tract infection secondary to symptoms experienced. Start ciprofloxacin 250 mg. Follow up if symptoms worsen or fail to improve.

## 2014-07-15 NOTE — Assessment & Plan Note (Signed)
Patient indicates she feels well and exam confirms status post recovery from pneumonia. No further treatment is needed at this time. Follow-up if symptoms return.

## 2014-07-15 NOTE — Patient Instructions (Addendum)
Thank you for choosing [facility].  Summary/Instructions:  Your prescription(s) have been submitted to your pharmacy or been printed and provided for you. Please take as directed and contact our office if you believe you are having problem(s) with the medication(s) or have any questions.  If your symptoms worsen or fail to improve, please contact our office for further instruction, or in case of emergency go directly to the emergency room at the closest medical facility.   Urinary Tract Infection Urinary tract infections (UTIs) can develop anywhere along your urinary tract. Your urinary tract is your body's drainage system for removing wastes and extra water. Your urinary tract includes two kidneys, two ureters, a bladder, and a urethra. Your kidneys are a pair of bean-shaped organs. Each kidney is about the size of your fist. They are located below your ribs, one on each side of your spine. CAUSES Infections are caused by microbes, which are microscopic organisms, including fungi, viruses, and bacteria. These organisms are so small that they can only be seen through a microscope. Bacteria are the microbes that most commonly cause UTIs. SYMPTOMS  Symptoms of UTIs may vary by age and gender of the patient and by the location of the infection. Symptoms in young women typically include a frequent and intense urge to urinate and a painful, burning feeling in the bladder or urethra during urination. Older women and men are more likely to be tired, shaky, and weak and have muscle aches and abdominal pain. A fever may mean the infection is in your kidneys. Other symptoms of a kidney infection include pain in your back or sides below the ribs, nausea, and vomiting. DIAGNOSIS To diagnose a UTI, your caregiver will ask you about your symptoms. Your caregiver also will ask to provide a urine sample. The urine sample will be tested for bacteria and white blood cells. White blood cells are made by your  body to help fight infection. TREATMENT  Typically, UTIs can be treated with medication. Because most UTIs are caused by a bacterial infection, they usually can be treated with the use of antibiotics. The choice of antibiotic and length of treatment depend on your symptoms and the type of bacteria causing your infection. HOME CARE INSTRUCTIONS  If you were prescribed antibiotics, take them exactly as your caregiver instructs you. Finish the medication even if you feel better after you have only taken some of the medication.  Drink enough water and fluids to keep your urine clear or pale yellow.  Avoid caffeine, tea, and carbonated beverages. They tend to irritate your bladder.  Empty your bladder often. Avoid holding urine for long periods of time.  Empty your bladder before and after sexual intercourse.  After a bowel movement, women should cleanse from front to back. Use each tissue only once. SEEK MEDICAL CARE IF:   You have back pain.  You develop a fever.  Your symptoms do not begin to resolve within 3 days. SEEK IMMEDIATE MEDICAL CARE IF:   You have severe back pain or lower abdominal pain.  You develop chills.  You have nausea or vomiting.  You have continued burning or discomfort with urination. MAKE SURE YOU:   Understand these instructions.  Will watch your condition.  Will get help right away if you are not doing well or get worse. Document Released: 04/05/2005 Document Revised: 12/26/2011 Document Reviewed: 08/04/2011 Exit[employer] Patient Information 2015 [employer]. This information is not intended to replace advice given to you by your health care provider.   Make sure you discuss any questions you have with your health care provider.   

## 2014-07-16 ENCOUNTER — Other Ambulatory Visit: Payer: Medicare Other

## 2014-07-16 DIAGNOSIS — R350 Frequency of micturition: Secondary | ICD-10-CM

## 2014-07-16 NOTE — Addendum Note (Signed)
Addended by: Mercer PodWRENN, Akeelah Seppala E on: 07/16/2014 08:53 AM   Modules accepted: Orders

## 2014-07-17 LAB — URINE CULTURE: Colony Count: 50000

## 2014-07-20 ENCOUNTER — Other Ambulatory Visit: Payer: Self-pay | Admitting: Internal Medicine

## 2014-07-22 ENCOUNTER — Telehealth: Payer: Self-pay | Admitting: Internal Medicine

## 2014-07-22 ENCOUNTER — Other Ambulatory Visit: Payer: Self-pay

## 2014-07-22 MED ORDER — HYDROCODONE-ACETAMINOPHEN 5-325 MG PO TABS: 1.0000 | ORAL_TABLET | Freq: Four times a day (QID) | ORAL | Status: DC | PRN

## 2014-07-22 MED ORDER — PREGABALIN 50 MG PO CAPS: 50.0000 mg | ORAL_CAPSULE | Freq: Three times a day (TID) | ORAL | Status: DC

## 2014-07-22 MED ORDER — FENTANYL 50 MCG/HR TD PT72: 50.0000 ug | MEDICATED_PATCH | TRANSDERMAL | Status: DC

## 2014-07-22 NOTE — Telephone Encounter (Signed)
Pt daughter  called in about the refills that was sent in on the 11th to Dr Felicity Coyerleschber for both meds.

## 2014-07-22 NOTE — Telephone Encounter (Signed)
Called pt to let her know that pain rx are printed.   Informed that there were 2 rx sent in on Monday, Lyrica and the KlorCon. Pt stated that she believed that those have been picked up.

## 2014-08-10 ENCOUNTER — Other Ambulatory Visit: Payer: Self-pay | Admitting: Physician Assistant

## 2014-08-13 DIAGNOSIS — M6281 Muscle weakness (generalized): Secondary | ICD-10-CM | POA: Diagnosis not present

## 2014-08-21 ENCOUNTER — Ambulatory Visit (INDEPENDENT_AMBULATORY_CARE_PROVIDER_SITE_OTHER): Payer: Medicare Other | Admitting: Internal Medicine

## 2014-08-21 ENCOUNTER — Encounter: Payer: Self-pay | Admitting: Internal Medicine

## 2014-08-21 VITALS — BP 142/74 | HR 80 | Temp 98.9°F | Ht 62.0 in | Wt 112.5 lb

## 2014-08-21 DIAGNOSIS — R351 Nocturia: Secondary | ICD-10-CM

## 2014-08-21 DIAGNOSIS — R300 Dysuria: Secondary | ICD-10-CM

## 2014-08-21 DIAGNOSIS — J209 Acute bronchitis, unspecified: Secondary | ICD-10-CM

## 2014-08-21 MED ORDER — BENZONATATE 100 MG PO CAPS: 100.0000 mg | ORAL_CAPSULE | Freq: Three times a day (TID) | ORAL | Status: DC

## 2014-08-21 MED ORDER — DOXYCYCLINE HYCLATE 100 MG PO TABS: 100.0000 mg | ORAL_TABLET | Freq: Two times a day (BID) | ORAL | Status: DC

## 2014-08-21 NOTE — Patient Instructions (Addendum)
Carry room temperature water and sip liberally after coughing. If urinary symptoms persist ; please collect urine @ home & bring to lab.

## 2014-08-21 NOTE — Progress Notes (Signed)
   Subjective:    Patient ID: Julia Black, female    DOB: 05/27/1919, [age] y.o.   MRN: [id number]  HPI  Symptoms began yesterday as some burning in her throat and paroxysms of cough lasting up to 2 minutes. The cough is rattly &  nonproductive.  Her concern is that she had Legionnaires pneumonia in the summer 2015.  She's used Hall's cough drops with only partial response. The cough is worse supine.   She has chronic shortness of breath.  She also has some dyspepsia which is mild.  She has not smoked for decades.  Review of Systems Frontal headache, facial pain , nasal purulence, dental pain, sore throat , otic pain or otic discharge denied. No fever , chills or sweats. Some dysuria & nocturia last night. She brought in specimen in empty pill bottle.    Objective:   Physical Exam   Pertinent or positive findings include: O2 sats don't register. Apparently this was issue in hospital also. Appears younger than stated age She has complete dentures Breath sounds are decreased posteriorly; anteriorly minor  rales are noted with deep inspiration in the parasternal areas. No increased work of breathing She does exhibit a rattly cough. She has severe mixed DIP/DIP joint changes. The skin over lower extremities tissue paper thin. Pedal pulses are decreased.  General appearance:Adequately nourished; no acute distress .  No  lymphadenopathy about the head, neck, or axilla noted.  Eyes: No conjunctival inflammation or lid edema is present. There is no scleral icterus. Ears:  External ear exam shows no significant lesions or deformities.  Otoscopic examination reveals clear canals, tympanic membranes are intact bilaterally without bulging, retraction, inflammation or discharge. Nose:  External nasal examination shows no deformity or inflammation. Nasal mucosa are dry without lesions or exudates. No septal dislocation or deviation.No obstruction to airflow.  Oral exam:  lips and gums are  healthy appearing.There is no oropharyngeal erythema or exudate noted.  Neck:  No deformities, thyromegaly, masses, or tenderness noted.   Heart:  Normal rate and regular rhythm. S1 and S2 normal without gallop, murmur, click, rub or other extra sounds.  Extremities:  No cyanosis, edema, or clubbing  noted  Skin: Warm & dry w/o jaundice or tenting.      Assessment & Plan:  #1 acute bronchitis #2 dysuria & nocturia ; unable to provide CCU. Specimen bottle& sterile towelette provided #3 chronic pain. She requested early hydrocodone refill; I deferred to Dr Felicity CoyerLeschber as she is on Fentanyl also.  Plan: See orders recommendations

## 2014-08-21 NOTE — Progress Notes (Signed)
Pre visit review using our clinic review tool, if applicable. No additional management support is needed unless otherwise documented below in the visit note. 

## 2014-08-24 ENCOUNTER — Encounter: Payer: Self-pay | Admitting: Internal Medicine

## 2014-08-24 ENCOUNTER — Other Ambulatory Visit: Payer: Self-pay | Admitting: Internal Medicine

## 2014-08-24 ENCOUNTER — Other Ambulatory Visit: Payer: Self-pay

## 2014-08-24 MED ORDER — HYDROCODONE-ACETAMINOPHEN 5-325 MG PO TABS: 1.0000 | ORAL_TABLET | Freq: Four times a day (QID) | ORAL | Status: DC | PRN

## 2014-08-24 MED ORDER — FENTANYL 50 MCG/HR TD PT72: 50.0000 ug | MEDICATED_PATCH | TRANSDERMAL | Status: DC

## 2014-09-14 ENCOUNTER — Other Ambulatory Visit: Payer: Self-pay | Admitting: Internal Medicine

## 2014-09-21 NOTE — Progress Notes (Signed)
HPI: FU CAD s/p CABG in 2004, CHF, carotid stenosis, HTN, HL, atrial fibrillation. She was admitted in 02/2013 with elevated troponin in the setting of CHF. EF was 25-30% with anteroseptal and apical AK on echo. This was felt to represent stress-induced cardiomyopathy. Conservative measures were felt to be appropriate if possible given advanced age. She improved with medical Rx. Myoview 02/2013 was low risk without ischemia. She did have evidence of anterior scar. EF was 49%. Follow up echo in 03/2013 demonstrated normal LV function with moderate diastolic dysfunction, mild to moderate MR with bileaflet mitral valve prolapse and biatrial enlargement. She is not anticoagulated for atrial fibrillation due to history of fall risk. Admitted 12/15 with pneumonia and NSTEMI; treated conservatively for MI at her request. Since last seen, she denies dyspnea, chest pain, palpitations or syncope.  Current Outpatient Prescriptions  Medication Sig Dispense Refill  . ALPRAZolam (XANAX) 0.25 MG tablet Take 1 tablet (0.25 mg total) by mouth at bedtime as needed for sleep. 30 tablet 5  . Ascorbic Acid (VITAMIN C) 500 MG tablet Take 1,000 mg by mouth daily.     [name]. aspirin 81 MG tablet Take 81 mg by mouth at bedtime.     [name]. DOK 100 MG capsule TAKE ONE CAPSULE TWICE A DAY 60 capsule 11  . fentaNYL (DURAGESIC) 50 MCG/HR Place 1 patch (50 mcg total) onto the skin every 3 (three) days. 10 patch 0  . ferrous sulfate 325 (65 FE) MG tablet Take 325 mg by mouth daily with breakfast.    . furosemide (LASIX) 20 MG tablet TAKE ONE TABLET EVERY DAY 30 tablet 3  . HYDROcodone-acetaminophen (NORCO/VICODIN) 5-325 MG per tablet Take 1 tablet by mouth every 6 (six) hours as needed. 120 tablet 0  . metoprolol tartrate (LOPRESSOR) 25 MG tablet TAKE 1/2 TABLET TWICE DAILY 30 tablet 5  . Multiple Vitamin (MULTIVITAMIN WITH MINERALS) TABS Take 1 tablet by mouth daily.    . nitroGLYCERIN (NITROSTAT) 0.4 MG SL tablet Place 1 tablet (0.4  mg total) under the tongue every 5 (five) minutes as needed for chest pain (upt o 3 doses). 25 tablet 4  . Omega-3 Fatty Acids (FISH OIL) 1000 MG CAPS Take 1 capsule by mouth daily.     . pantoprazole (PROTONIX) 40 MG tablet TAKE ONE TABLET BY MOUTH ONCE DAILY 30 tablet 4  . polyethylene glycol (MIRALAX / GLYCOLAX) packet Take 17 g by mouth daily as needed (constipation).     . potassium chloride SA (K-DUR,KLOR-CON) 20 MEQ tablet Take 1 tablet (20 mEq total) by mouth once. 30 tablet 5  . pravastatin (PRAVACHOL) 40 MG tablet Take 1 tablet (40 mg total) by mouth every evening. 90 tablet 3  . pregabalin (LYRICA) 50 MG capsule Take 1 capsule (50 mg total) by mouth 3 (three) times daily. 270 capsule 1  . venlafaxine XR (EFFEXOR-XR) 75 MG 24 hr capsule TAKE ONE CAPSULE EACH DAY 30 capsule 5  . zolpidem (AMBIEN) 5 MG tablet Take 5 mg by mouth at bedtime as needed for sleep.     No current facility-administered medications for this visit.     Past Medical History  Diagnosis Date  . SPINAL STENOSIS   . LOW BACK PAIN     chronic, follows with Nsurg for ESI  . FRACTURE, PELVIS, RIGHT 12/2009  . HIP FRACTURE, LEFT 09/2009    s/p L THA  . ALLERGIC RHINITIS   . Carpal tunnel syndrome   . Hypertension   .  HYPERLIPIDEMIA   . GERD   . Arthritis   . Osteoporosis   . Atrial fibrillation     no anticoag due to fall risk/age  . DEPRESSION   . CORONARY ARTERY DISEASE 2004    a.  LHC (01/2003):  3 v CAD => s/p CABG.;   b.  Myoview (02/2013):  Anterior scar; no ischemia; EF 49%.    . Systolic congestive heart failure   . 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 Echo (02/2013):  EF 25-30%.;   b.  Echo (04/04/13):  Mild focal basal septal hypertrophy, EF 60-65%, no RWMA, Gr 2 DD, mild to mod MAC, mild bileaflet MVP, mild to mod MR, mild to mod LAE, mild RAE, PASP 48.  [name] H/O Doppler ultrasound     Renal Artery [place] (09/2010):  No RAS.  . Carotid artery occlusion     Carotid US (05/2010):  RICA 70-99%; LICA  0-49%  . Anemia     Past Surgical History  Procedure Laterality Date  . (l) hip hemiarthroplasty  09/2009  . Appendectomy  1940  . Eye surgery      both cataracts  . Coronary artery bypass graft  2004  . Carpal tunnel release  2004    right and left-dsc  . Ulnar nerve transposition  2009    lt   . Vein ligation      legs  . Colonoscopy    . I&d extremity Right 12/26/2012    Procedure: IRRIGATION AND DEBRIDEMENT OF RIGHT LEG, SURGICAL PREP PLACEMENT OF A CELL AND VAC   ;  Surgeon: Wayland Denis, DO;  Location: [facility];  Service: Plastics;  Laterality: Right;    History   Social History  . Marital Status: Divorced    Spouse Name: N/A  . Number of Children: N/A  . Years of Education: N/A   Occupational History  . Not on file.   Social History Main Topics  . Smoking status: Former Smoker    Quit date: 07/10/1978  . Smokeless tobacco: Never Used     Comment: Lives alone in indep living facility @ [place]. Active ADLS but does not drive dur to macular degen. supportive family members nearby  . Alcohol Use: No  . Drug Use: No  . Sexual Activity: No   Other Topics Concern  . Not on file   Social History Narrative   ACP - HCPOA Margarette Asal (c) [phone]; No CPR,  No mechanical ventilation.     ROS: shoulder arthralgias but no fevers or chills, productive cough, hemoptysis, dysphasia, odynophagia, melena, hematochezia, dysuria, hematuria, rash, seizure activity, orthopnea, PND, pedal edema, claudication. Remaining systems are negative.  Physical Exam: Well-developed frail in no acute distress.  Skin is warm and dry.  HEENT is normal.  Neck is supple.  Chest is clear to auscultation with normal expansion.  Cardiovascular exam is regular rate and rhythm.  Abdominal exam nontender or distended. No masses palpated. Extremities show no edema. neuro grossly intact

## 2014-09-22 ENCOUNTER — Encounter: Payer: Self-pay | Admitting: Cardiology

## 2014-09-22 ENCOUNTER — Ambulatory Visit (INDEPENDENT_AMBULATORY_CARE_PROVIDER_SITE_OTHER): Payer: Medicare Other | Admitting: Cardiology

## 2014-09-22 VITALS — BP 128/64 | HR 60 | Ht 62.0 in | Wt 112.0 lb

## 2014-09-22 DIAGNOSIS — I10 Essential (primary) hypertension: Secondary | ICD-10-CM

## 2014-09-22 DIAGNOSIS — I5033 Acute on chronic diastolic (congestive) heart failure: Secondary | ICD-10-CM | POA: Diagnosis not present

## 2014-09-22 DIAGNOSIS — I2510 Atherosclerotic heart disease of native coronary artery without angina pectoris: Secondary | ICD-10-CM

## 2014-09-22 DIAGNOSIS — I679 Cerebrovascular disease, unspecified: Secondary | ICD-10-CM | POA: Diagnosis not present

## 2014-09-22 LAB — BASIC METABOLIC PANEL WITH GFR
BUN: 23 mg/dL (ref 6–23)
CALCIUM: 8.9 mg/dL (ref 8.4–10.5)
CHLORIDE: 104 meq/L (ref 96–112)
CO2: 29 mEq/L (ref 19–32)
Creat: 0.74 mg/dL (ref 0.50–1.10)
GFR, Est African American: 80 mL/min
GFR, Est Non African American: 70 mL/min
GLUCOSE: 70 mg/dL (ref 70–99)
POTASSIUM: 4.6 meq/L (ref 3.5–5.3)
Sodium: 142 mEq/L (ref 135–145)

## 2014-09-22 NOTE — Assessment & Plan Note (Signed)
Continue present blood pressure medications. 

## 2014-09-22 NOTE — Assessment & Plan Note (Signed)
Continue statin. 

## 2014-09-22 NOTE — Patient Instructions (Signed)
Your physician wants you to follow-up in: 6 MONTHS WITH DR CRENSHAW You will receive a reminder letter in the mail two months in advance. If you don't receive a letter, please call our office to schedule the follow-up appointment.   Your physician has requested that you have an echocardiogram. Echocardiography is a painless test that uses sound waves to create images of your heart. It provides your doctor with information about the size and shape of your heart and how well your heart's chambers and valves are working. This procedure takes approximately one hour. There are no restrictions for this procedure.   Your physician recommends that you HAVE LAB WORK TODAY 

## 2014-09-22 NOTE — Assessment & Plan Note (Signed)
Continue present dose of Lasix.taken additional 20 mg daily for weight gain of 2 pounds. Repeat echocardiogram. Check potassium and renal function.

## 2014-09-22 NOTE — Assessment & Plan Note (Signed)
Continue aspirin and statin. I discussed with patient previously. Given age we will not pursue further carotid Dopplers and she is in agreement. 

## 2014-09-22 NOTE — Assessment & Plan Note (Signed)
Continue aspirin and statin. 

## 2014-09-22 NOTE — Assessment & Plan Note (Signed)
Not on Coumadin because of fall risk. Continue aspirin. Continue beta blocker.

## 2014-09-24 ENCOUNTER — Encounter: Payer: Self-pay | Admitting: Internal Medicine

## 2014-09-24 ENCOUNTER — Other Ambulatory Visit: Payer: Self-pay | Admitting: Internal Medicine

## 2014-09-24 NOTE — Telephone Encounter (Signed)
MD out of office pls advise on refill.../lmb 

## 2014-09-24 NOTE — Telephone Encounter (Signed)
Done hardcopy to Cherina  

## 2014-09-24 NOTE — Telephone Encounter (Signed)
Done

## 2014-09-25 ENCOUNTER — Telehealth: Payer: Self-pay | Admitting: Internal Medicine

## 2014-09-25 ENCOUNTER — Other Ambulatory Visit: Payer: Self-pay

## 2014-09-25 MED ORDER — FENTANYL 50 MCG/HR TD PT72: 50.0000 ug | MEDICATED_PATCH | TRANSDERMAL | Status: DC

## 2014-09-25 MED ORDER — HYDROCODONE-ACETAMINOPHEN 5-325 MG PO TABS: 1.0000 | ORAL_TABLET | Freq: Four times a day (QID) | ORAL | Status: DC | PRN

## 2014-09-25 NOTE — Telephone Encounter (Signed)
Pt daughter called in and would like to beable to to get pt hydrocodone script before the week end    Best number [phone]

## 2014-09-25 NOTE — Telephone Encounter (Signed)
rx printed, okay. Pt informed they are ready for pick up.

## 2014-09-25 NOTE — Telephone Encounter (Signed)
MD is out of office pls advise on refill.../lmb 

## 2014-09-30 ENCOUNTER — Ambulatory Visit (HOSPITAL_COMMUNITY)
Admission: RE | Admit: 2014-09-30 | Discharge: 2014-09-30 | Disposition: A | Discharge status: Home / Self Care | Payer: Medicare Other | Source: Ambulatory Visit | Attending: Cardiology | Admitting: Cardiology

## 2014-09-30 DIAGNOSIS — I2510 Atherosclerotic heart disease of native coronary artery without angina pectoris: Secondary | ICD-10-CM | POA: Diagnosis not present

## 2014-09-30 LAB — 2D ECHOCARDIOGRAM WO CONTRAST

## 2014-09-30 NOTE — Progress Notes (Signed)
2D Echo Performed 09/30/2014    Noah Pelaez, RCS  

## 2014-10-09 ENCOUNTER — Other Ambulatory Visit: Payer: Self-pay | Admitting: Internal Medicine

## 2014-10-09 ENCOUNTER — Other Ambulatory Visit: Payer: Medicare Other

## 2014-10-09 DIAGNOSIS — R300 Dysuria: Secondary | ICD-10-CM

## 2014-10-10 ENCOUNTER — Encounter: Payer: Self-pay | Admitting: Internal Medicine

## 2014-10-12 ENCOUNTER — Encounter: Payer: Self-pay | Admitting: Internal Medicine

## 2014-10-12 LAB — UA/M W/RFLX CULTURE, ROUTINE
Bilirubin, UA: NEGATIVE
GLUCOSE, UA: NEGATIVE
Ketones, UA: NEGATIVE
NITRITE UA: NEGATIVE
PH UA: 6.5 (ref 5.0–7.5)
Specific Gravity, UA: 1.021 (ref 1.005–1.030)
Urobilinogen, Ur: 0.2 mg/dL (ref 0.2–1.0)

## 2014-10-12 LAB — MICROSCOPIC EXAMINATION
CASTS: NONE SEEN /LPF
Epithelial Cells (non renal): NONE SEEN /hpf (ref 0–10)
WBC, UA: >30 /hpf — AB (ref 0–?)

## 2014-10-12 LAB — URINE CULTURE, REFLEX

## 2014-10-13 ENCOUNTER — Other Ambulatory Visit: Payer: Self-pay

## 2014-10-13 ENCOUNTER — Other Ambulatory Visit: Payer: Self-pay | Admitting: Internal Medicine

## 2014-10-13 MED ORDER — CIPROFLOXACIN HCL 250 MG PO TABS: 250.0000 mg | ORAL_TABLET | Freq: Two times a day (BID) | ORAL | Status: DC

## 2014-10-21 ENCOUNTER — Other Ambulatory Visit: Payer: Self-pay | Admitting: Cardiology

## 2014-10-21 ENCOUNTER — Other Ambulatory Visit: Payer: Self-pay | Admitting: Internal Medicine

## 2014-10-22 ENCOUNTER — Encounter: Payer: Self-pay | Admitting: Internal Medicine

## 2014-10-22 MED ORDER — HYDROCODONE-ACETAMINOPHEN 5-325 MG PO TABS: 1.0000 | ORAL_TABLET | Freq: Four times a day (QID) | ORAL | Status: DC | PRN

## 2014-10-22 NOTE — Telephone Encounter (Signed)
Printed rx place on md desk to sign tomorrow...[name]/lmb

## 2014-10-22 NOTE — Telephone Encounter (Signed)
Please print so I can sign for pick up - then call Darl PikesSusan (dtr) to let her know same thanks

## 2014-10-23 ENCOUNTER — Telehealth: Payer: Self-pay | Admitting: Internal Medicine

## 2014-10-23 MED ORDER — HYDROCODONE-ACETAMINOPHEN 5-325 MG PO TABS: 1.0000 | ORAL_TABLET | Freq: Four times a day (QID) | ORAL | Status: DC | PRN

## 2014-10-23 NOTE — Telephone Encounter (Signed)
Printed and signed. Please make sure that duplicate prescriptions are not given.

## 2014-10-23 NOTE — Telephone Encounter (Signed)
Dr. Felicity CoyerLeschber approved rx and was suppose to come by the office to sign script. Wanting another md to sign script she will be out of meds weekend...[name]/lmb

## 2014-10-23 NOTE — Telephone Encounter (Signed)
Julia Black is calling to follow up on norco request. script shows pending with dr Felicity Coyerleschber. Is another provider going to sign off on this? Please advise.

## 2014-10-23 NOTE — Telephone Encounter (Signed)
Julia Black notified pt daughter rx ready for pick-up. Shredded script that Dr. Felicity CoyerLeschber ok...lmb

## 2014-11-12 ENCOUNTER — Other Ambulatory Visit: Payer: Self-pay | Admitting: Cardiology

## 2014-11-16 ENCOUNTER — Encounter: Payer: Self-pay | Admitting: Internal Medicine

## 2014-11-16 DIAGNOSIS — R300 Dysuria: Secondary | ICD-10-CM

## 2014-11-17 ENCOUNTER — Other Ambulatory Visit: Payer: Medicare Other

## 2014-11-17 DIAGNOSIS — R300 Dysuria: Secondary | ICD-10-CM

## 2014-11-17 LAB — URINALYSIS WITH CULTURE, IF INDICATED
BILIRUBIN URINE: NEGATIVE
Hgb urine dipstick: NEGATIVE
KETONES UR: NEGATIVE
Leukocytes, UA: NEGATIVE
Nitrite: NEGATIVE
PH: 6 (ref 5.0–8.0)
RBC / HPF: NONE SEEN (ref 0–?)
Specific Gravity, Urine: 1.02 (ref 1.000–1.030)
TOTAL PROTEIN, URINE-UPE24: NEGATIVE
Urine Glucose: NEGATIVE
Urobilinogen, UA: 0.2 (ref 0.0–1.0)
WBC, UA: NONE SEEN (ref 0–?)

## 2014-11-23 ENCOUNTER — Telehealth: Payer: Self-pay | Admitting: Internal Medicine

## 2014-11-23 MED ORDER — HYDROCODONE-ACETAMINOPHEN 5-325 MG PO TABS: 1.0000 | ORAL_TABLET | Freq: Four times a day (QID) | ORAL | Status: DC | PRN

## 2014-11-23 NOTE — Telephone Encounter (Signed)
Patient is requesting a refill of   HYDROcodone-acetaminophen (NORCO/VICODIN) 5-325 MG per tablet [[id number]][[id number]]      . She states that she has enough for today.

## 2014-11-23 NOTE — Telephone Encounter (Signed)
Called pt lm that script is in cabinet (up front) to pick up

## 2014-11-23 NOTE — Addendum Note (Signed)
Addended by: Tonye BecketAIRRIKIER, Roseann Kees M on: 11/23/2014 09:27 AM   Modules accepted: Orders

## 2014-11-27 ENCOUNTER — Other Ambulatory Visit: Payer: Self-pay | Admitting: Cardiology

## 2014-11-27 NOTE — Telephone Encounter (Signed)
Could you please clarify what current therapy should be? Snapshot has that she takes 20mg  qd, but it associated with her pcp. Please advise. Thanks, MI

## 2014-12-04 ENCOUNTER — Other Ambulatory Visit: Payer: Self-pay | Admitting: Internal Medicine

## 2014-12-04 NOTE — Telephone Encounter (Signed)
MD out of office pls advise on refill.../lmb 

## 2014-12-04 NOTE — Telephone Encounter (Signed)
Rx faxed to pharmacy  

## 2014-12-09 NOTE — Telephone Encounter (Signed)
Will need to make contact with the patient regarding current dosage.

## 2014-12-10 ENCOUNTER — Encounter: Payer: Self-pay | Admitting: Internal Medicine

## 2014-12-10 NOTE — Telephone Encounter (Signed)
Patients daughter-in-law, Jossie NgBarbara Diskin, called back and she stated that the patient takes pravastatin 40mg  qd. She does not recall the pcp reducing the dose and she has continued to take the 40mg . Ok to refill?

## 2014-12-10 NOTE — Telephone Encounter (Signed)
Late entry. Spoke with patient yesterday afternoon and she is not sure of the dose, but will have daughter call me to discuss. Daughter in law returned my call and stated that she handles patients medications, but she too is unsure of dose. She stated that she would call the pharmacy and find out and then call me back.

## 2014-12-11 NOTE — Telephone Encounter (Signed)
Yes, okay to refill 

## 2014-12-12 MED ORDER — DOCUSATE SODIUM 100 MG PO CAPS: 100.0000 mg | ORAL_CAPSULE | Freq: Every day | ORAL | Status: DC

## 2014-12-14 ENCOUNTER — Telehealth: Payer: Self-pay | Admitting: Internal Medicine

## 2014-12-14 ENCOUNTER — Other Ambulatory Visit: Payer: Medicare Other

## 2014-12-14 DIAGNOSIS — R300 Dysuria: Secondary | ICD-10-CM

## 2014-12-14 NOTE — Telephone Encounter (Signed)
She states that patient complains of a UTI. Can an order be placed so external sample be brought in? Please call Britta Mccreedybarbara to advise

## 2014-12-14 NOTE — Telephone Encounter (Signed)
LVM for dtr to call back.   RE: orders have been placed for UTI sx.

## 2014-12-15 LAB — URINALYSIS W MICROSCOPIC + REFLEX CULTURE
Bilirubin Urine: NEGATIVE
CRYSTALS: NONE SEEN
Casts: NONE SEEN
GLUCOSE, UA: NEGATIVE mg/dL
Hgb urine dipstick: NEGATIVE
Ketones, ur: NEGATIVE mg/dL
Leukocytes, UA: NEGATIVE
NITRITE: NEGATIVE
Protein, ur: NEGATIVE mg/dL
SPECIFIC GRAVITY, URINE: 1.022 (ref 1.005–1.030)
Squamous Epithelial / LPF: NONE SEEN
Urobilinogen, UA: 0.2 mg/dL (ref 0.0–1.0)
pH: 6 (ref 5.0–8.0)

## 2014-12-18 ENCOUNTER — Telehealth: Payer: Self-pay | Admitting: Internal Medicine

## 2014-12-18 NOTE — Telephone Encounter (Signed)
MD is out of office pls advise../lmb 

## 2014-12-18 NOTE — Telephone Encounter (Signed)
Called pt no answer LMOM with md response.../lmb 

## 2014-12-18 NOTE — Telephone Encounter (Signed)
Early to fill pain medicine (not due until next Thursday. Can she wait until Tuesday and pick up both then?

## 2014-12-18 NOTE — Telephone Encounter (Signed)
Pt called in and said that she needs her HYDROcodone-acetaminophen (NORCO/VICODIN) 5-325 MG per tablet [[id number]]  And her pain patch

## 2014-12-21 ENCOUNTER — Telehealth: Payer: Self-pay | Admitting: Internal Medicine

## 2014-12-21 NOTE — Telephone Encounter (Signed)
Informed that [facility] did not do the cx due to the criteria not being met.

## 2014-12-21 NOTE — Telephone Encounter (Signed)
States Dr. Felicity Coyer did give both scripts for hydrocodone to [facility].  States that [facility] lost 2nd script.

## 2014-12-21 NOTE — Telephone Encounter (Signed)
Disregard rx request, this is already taken care of.

## 2014-12-21 NOTE — Telephone Encounter (Signed)
Would like to pick up anther script.

## 2014-12-21 NOTE — Telephone Encounter (Signed)
Spoke to pt dtr and she asked about the UCX results. No results in lab tab or in abstract folder to enter.  Called [facility] and they are not sure why a cx was not done. The order was for UA micro with reflex cx.  Loney Loh is to call me back with more information regarding the cx.

## 2014-12-21 NOTE — Telephone Encounter (Signed)
Pt called in and wanted to see if she can pick up her scrip tomorrow the 13th?  She said that she would like for it to be ready?  Told her i would send message have nurse call her when it was ready to pick up.

## 2014-12-21 NOTE — Telephone Encounter (Signed)
Called Julia Black and they were able to locate the rx.

## 2014-12-21 NOTE — Telephone Encounter (Signed)
Patient stated that she called about her medication Hydrocodone, Friday and was told it would be ready today. please advise

## 2014-12-24 ENCOUNTER — Ambulatory Visit (INDEPENDENT_AMBULATORY_CARE_PROVIDER_SITE_OTHER): Payer: Medicare Other | Admitting: Internal Medicine

## 2014-12-24 ENCOUNTER — Encounter: Payer: Self-pay | Admitting: Internal Medicine

## 2014-12-24 VITALS — BP 130/74 | HR 84 | Temp 98.0°F | Resp 16 | Ht 62.0 in | Wt 104.0 lb

## 2014-12-24 DIAGNOSIS — R300 Dysuria: Secondary | ICD-10-CM

## 2014-12-24 DIAGNOSIS — I2510 Atherosclerotic heart disease of native coronary artery without angina pectoris: Secondary | ICD-10-CM

## 2014-12-24 DIAGNOSIS — R197 Diarrhea, unspecified: Secondary | ICD-10-CM

## 2014-12-24 DIAGNOSIS — M545 Low back pain: Secondary | ICD-10-CM

## 2014-12-24 MED ORDER — HYDROCORTISONE 2.5 % RE CREA: 1.0000 "application " | TOPICAL_CREAM | Freq: Two times a day (BID) | RECTAL | Status: DC

## 2014-12-24 MED ORDER — FENTANYL 50 MCG/HR TD PT72: 50.0000 ug | MEDICATED_PATCH | TRANSDERMAL | Status: DC

## 2014-12-24 NOTE — Patient Instructions (Addendum)
We have given you the pain patch back again and you can start to use it. This may slow down the gut some and I would give it 1-2 days until you try anything else for it.   We need a stool sample and urine so we can run the test for the bad infection called C dif in the stool.   If in 2 days you are still having a lot of urgency you can try taking 1/2 of an imodium pill in the morning to see if this decreases the urgency.   It is possible that the antibiotics you have had caused you to have some irritable stomach and gut that means your reflex to move the gut when eating is much stronger than it used to be.  We have also sent in a cream for the hemorrhoids.   Diet and Irritable Bowel Syndrome  No cure has been found for irritable bowel syndrome (IBS). Many options are available to treat the symptoms. Your caregiver will give you the best treatments available for your symptoms. He or she will also encourage you to manage stress and to make changes to your diet. You need to work with your caregiver and Registered Dietician to find the best combination of medicine, diet, counseling, and support to control your symptoms. The following are some diet suggestions. FOODS THAT MAKE IBS WORSE  Fatty foods, such as [place] fries.  Milk products, such as cheese or ice cream.  Chocolate.  Alcohol.  Caffeine (found in coffee and some sodas).  Carbonated drinks, such as soda. If certain foods cause symptoms, you should eat less of them or stop eating them. FOOD JOURNAL   Keep a journal of the foods that seem to cause distress. Write down:  What you are eating during the day and when.  What problems you are having after eating.  When the symptoms occur in relation to your meals.  What foods always make you feel badly.  Take your notes with you to your caregiver to see if you should stop eating certain foods. FOODS THAT MAKE IBS BETTER Fiber reduces IBS symptoms, especially constipation, because  it makes stools soft, bulky, and easier to pass. Fiber is found in bran, bread, cereal, beans, fruit, and vegetables. Examples of foods with fiber include:  Apples.  Peaches.  Pears.  Berries.  Figs.  Broccoli, raw.  Cabbage.  Carrots.  Raw peas.  Kidney beans.  Lima beans.  Whole-grain bread.  Whole-grain cereal. Add foods with fiber to your diet a little at a time. This will let your body get used to them. Too much fiber at once might cause gas and swelling of your abdomen. This can trigger symptoms in a person with IBS. Caregivers usually recommend a diet with enough fiber to produce soft, painless bowel movements. High fiber diets may cause gas and bloating. However, these symptoms often go away within a few weeks, as your body adjusts. In many cases, dietary fiber may lessen IBS symptoms, particularly constipation. However, it may not help pain or diarrhea. High fiber diets keep the colon mildly enlarged (distended) with the added fiber. This may help prevent spasms in the colon. Some forms of fiber also keep water in the stool, thereby preventing hard stools that are difficult to pass.  Besides telling you to eat more foods with fiber, your caregiver may also tell you to get more fiber by taking a fiber pill or drinking water mixed with a special high fiber powder. An  example of this is a natural fiber laxative containing psyllium seed.  TIPS  Large meals can cause cramping and diarrhea in people with IBS. If this happens to you, try eating 4 or 5 small meals a day, or try eating less at each of your usual 3 meals. It may also help if your meals are low in fat and high in carbohydrates. Examples of carbohydrates are pasta, rice, whole-grain breads and cereals, fruits, and vegetables.  If dairy products cause your symptoms to flare up, you can try eating less of those foods. You might be able to handle yogurt better than other dairy products, because it contains bacteria that  helps with digestion. Dairy products are an important source of calcium and other nutrients. If you need to avoid dairy products, be sure to talk with a Registered Dietitian about getting these nutrients through other food sources.  Drink enough water and fluids to keep your urine clear or pale yellow. This is important, especially if you have diarrhea. FOR MORE INFORMATION  International Foundation for Functional Gastrointestinal Disorders: www.iffgd.org  National Digestive Diseases Information Clearinghouse: digestive.StageSync.si Document Released: 09/16/2003 Document Revised: 09/18/2011 Document Reviewed: 09/26/2013 [facility] Patient Information 2015 [place], [place]. This information is not intended to replace advice given to you by your health care provider. Make sure you discuss any questions you have with your health care provider.

## 2014-12-24 NOTE — Progress Notes (Signed)
Pre visit review using our clinic review tool, if applicable. No additional management support is needed unless otherwise documented below in the visit note. 

## 2014-12-25 ENCOUNTER — Other Ambulatory Visit (INDEPENDENT_AMBULATORY_CARE_PROVIDER_SITE_OTHER): Payer: Medicare Other

## 2014-12-25 DIAGNOSIS — R300 Dysuria: Secondary | ICD-10-CM | POA: Diagnosis not present

## 2014-12-25 DIAGNOSIS — R197 Diarrhea, unspecified: Secondary | ICD-10-CM | POA: Insufficient documentation

## 2014-12-25 LAB — URINALYSIS, ROUTINE W REFLEX MICROSCOPIC
Bilirubin Urine: NEGATIVE
Ketones, ur: NEGATIVE
Leukocytes, UA: NEGATIVE
Nitrite: NEGATIVE
PH: 7.5 (ref 5.0–8.0)
SPECIFIC GRAVITY, URINE: 1.015 (ref 1.000–1.030)
TOTAL PROTEIN, URINE-UPE24: NEGATIVE
URINE GLUCOSE: NEGATIVE
Urobilinogen, UA: 0.2 (ref 0.0–1.0)

## 2014-12-25 NOTE — Progress Notes (Signed)
   Subjective:    Patient ID: Julia Black, female    DOB: [birth year], 81 y.o.   MRN: [id number]  HPI The patient is coming in for an acute visit for increased stools and dysuria. She has had the dysuria off and on for several weeks and has brought a sample to the clinic which was negative for infection. Denies fevers or chills. Also having increased frequency (although she often goes a lot). Has not tried anything for it.  She is also having increased stools. Notices when she eats she has to run to the bathroom to go. They are not watery but just loose. She has not lost any weight over this time. She has also stopped her fentanyl patches during that time for unclear reason. She does not have much changes in her diet. Previously she struggled more with constipation. She has stopped her miralax and is on colace only once per day (from twice) without improvement in her symptoms. She is not sure they were helping. But now her back pain is very severe and the hydrocodone is not helping much. Also making her hemorrhoids much worse. They are swollen and painful with some mild bleeding.   Review of Systems  Constitutional: Positive for activity change and appetite change. Negative for fever, fatigue and unexpected weight change.  Respiratory: Negative for cough, chest tightness, shortness of breath and wheezing.   Cardiovascular: Negative for chest pain, palpitations and leg swelling.  Gastrointestinal: Positive for diarrhea and rectal pain. Negative for nausea, abdominal pain, constipation and abdominal distention.  Musculoskeletal: Positive for back pain and arthralgias.  Skin: Negative.   Neurological: Negative.       Objective:   Physical Exam  Constitutional: She is oriented to person, place, and time. She appears well-developed and well-nourished.  HENT:  Head: Normocephalic and atraumatic.  Eyes: EOM are normal.  Cardiovascular: Normal rate and regular rhythm.   Pulmonary/Chest: Effort normal.  No respiratory distress. She has no wheezes.  Abdominal: Soft. Bowel sounds are normal. She exhibits no distension. There is no tenderness.  Neurological: She is alert and oriented to person, place, and time. Coordination abnormal.  Skin: Skin is warm and dry.   Filed Vitals:   12/24/14 1320  BP: 130/74  Pulse: 84  Temp: 98 F (36.7 C)  TempSrc: Oral  Resp: 16  Height: 5\' 2"  (1.575 m)  Weight: 104 lb (47.174 kg)      Assessment & Plan:

## 2014-12-25 NOTE — Assessment & Plan Note (Signed)
Previously on fentanyl patch for pain and stopped several months ago. Will refill today on the condition that she return to our clinic for visit to discuss if working for pain control. Rx for fentanyl 50 mcg patch #10 no refills given today. She also has hydrocodone for breakthrough pain. If no improvement in pain at follow up needs to be discontinued.

## 2014-12-25 NOTE — Assessment & Plan Note (Signed)
Checking U/A given her symptoms although suspect that some of the frequency is chronic and not new. If signs of infection will treat but would like to limit antibiotics if she does not have an infection given her poor bowel flora situation right now.

## 2014-12-25 NOTE — Assessment & Plan Note (Signed)
Likely subacute at this point. Checking C dif since she has had antibiotics in the last 6 months. She is taking probiotics and colace. Advised since we are restarting the fentanyl patch would not add anything for diarrhea today. If in 3 days she is still having the problem she can take 1/2 imodium daily to see if this delays her impulse to go after meals. We did discuss the reflex with moving bowels and eating that can be normal although sounds like hers is more active. Does not sound to be infectious in nature and not tied to particular foods. Given suggestions for IBS diet to see if this helps her symptoms.

## 2014-12-28 ENCOUNTER — Other Ambulatory Visit: Payer: Self-pay | Admitting: Internal Medicine

## 2014-12-30 ENCOUNTER — Telehealth: Payer: Self-pay | Admitting: Geriatric Medicine

## 2014-12-30 ENCOUNTER — Encounter: Payer: Self-pay | Admitting: Internal Medicine

## 2014-12-30 NOTE — Telephone Encounter (Signed)
Spoke to [place] in the lab. The C-diff test was cancelled by Myrtie Neither. The stool may not have been the right consistency for the test. Julia Black is off today. Lynnette will speak with her and call us back tomorrow. We may have to ask the patient to bring in another sample.

## 2014-12-31 ENCOUNTER — Telehealth: Payer: Self-pay | Admitting: Geriatric Medicine

## 2014-12-31 NOTE — Telephone Encounter (Signed)
Pt dtr is requesting the results of the stool card. Please advise pt/ pt dtr on what the next step Dr. Dorise Hiss would like the pt to do.

## 2014-12-31 NOTE — Telephone Encounter (Signed)
Spoke with patient's daughter and informed her that the stool sample was hard and it needs to be soft in order to test for C-diff. She will bring in another sample when she has diarrhea again.

## 2015-01-04 ENCOUNTER — Other Ambulatory Visit: Payer: Self-pay

## 2015-01-12 ENCOUNTER — Other Ambulatory Visit: Payer: Self-pay | Admitting: Internal Medicine

## 2015-01-13 ENCOUNTER — Other Ambulatory Visit: Payer: Self-pay

## 2015-01-13 ENCOUNTER — Telehealth: Payer: Self-pay | Admitting: Internal Medicine

## 2015-01-13 MED ORDER — ZOLPIDEM TARTRATE 5 MG PO TABS: 5.0000 mg | ORAL_TABLET | Freq: Every evening | ORAL | Status: DC | PRN

## 2015-01-13 NOTE — Telephone Encounter (Signed)
[phone]   They called in, inquiring about the zolpidem (AMBIEN) 5 MG tablet [[id number]][[id number]]  Script?  She said that they have sent it 3 times and havent gotten a response

## 2015-01-13 NOTE — Telephone Encounter (Signed)
Message routed for approval .

## 2015-01-14 NOTE — Telephone Encounter (Signed)
Called into pharmacy

## 2015-01-14 NOTE — Telephone Encounter (Signed)
Received call from [facility] advising that they have not received fax. I verified that the fax # we have on the pharmacy list is correct. She asks that you fax it in again. Patient has inquired with them asking for status.

## 2015-01-14 NOTE — Telephone Encounter (Signed)
rx faxed to pharmacy per Delaney Meigsamara.

## 2015-01-17 ENCOUNTER — Emergency Department (HOSPITAL_COMMUNITY): Payer: Medicare Other

## 2015-01-17 ENCOUNTER — Emergency Department (HOSPITAL_COMMUNITY)
Admission: EM | Admit: 2015-01-17 | Discharge: 2015-01-17 | Disposition: A | Discharge status: Home / Self Care | Payer: Medicare Other | Attending: Emergency Medicine | Admitting: Emergency Medicine

## 2015-01-17 DIAGNOSIS — Z862 Personal history of diseases of the blood and blood-forming organs and certain disorders involving the immune mechanism: Secondary | ICD-10-CM | POA: Diagnosis not present

## 2015-01-17 DIAGNOSIS — S32502A Unspecified fracture of left pubis, initial encounter for closed fracture: Secondary | ICD-10-CM | POA: Insufficient documentation

## 2015-01-17 DIAGNOSIS — S32592A Other specified fracture of left pubis, initial encounter for closed fracture: Secondary | ICD-10-CM

## 2015-01-17 DIAGNOSIS — Y999 Unspecified external cause status: Secondary | ICD-10-CM | POA: Insufficient documentation

## 2015-01-17 DIAGNOSIS — Z87891 Personal history of nicotine dependence: Secondary | ICD-10-CM | POA: Insufficient documentation

## 2015-01-17 DIAGNOSIS — Z8709 Personal history of other diseases of the respiratory system: Secondary | ICD-10-CM | POA: Diagnosis not present

## 2015-01-17 DIAGNOSIS — Z880 Allergy status to penicillin: Secondary | ICD-10-CM | POA: Insufficient documentation

## 2015-01-17 DIAGNOSIS — M1990 Unspecified osteoarthritis, unspecified site: Secondary | ICD-10-CM | POA: Insufficient documentation

## 2015-01-17 DIAGNOSIS — K219 Gastro-esophageal reflux disease without esophagitis: Secondary | ICD-10-CM | POA: Diagnosis not present

## 2015-01-17 DIAGNOSIS — I2510 Atherosclerotic heart disease of native coronary artery without angina pectoris: Secondary | ICD-10-CM | POA: Diagnosis not present

## 2015-01-17 DIAGNOSIS — E785 Hyperlipidemia, unspecified: Secondary | ICD-10-CM | POA: Insufficient documentation

## 2015-01-17 DIAGNOSIS — Y929 Unspecified place or not applicable: Secondary | ICD-10-CM | POA: Diagnosis not present

## 2015-01-17 DIAGNOSIS — I10 Essential (primary) hypertension: Secondary | ICD-10-CM | POA: Diagnosis not present

## 2015-01-17 DIAGNOSIS — F329 Major depressive disorder, single episode, unspecified: Secondary | ICD-10-CM | POA: Insufficient documentation

## 2015-01-17 DIAGNOSIS — Z7982 Long term (current) use of aspirin: Secondary | ICD-10-CM | POA: Insufficient documentation

## 2015-01-17 DIAGNOSIS — S3992XA Unspecified injury of lower back, initial encounter: Secondary | ICD-10-CM | POA: Diagnosis present

## 2015-01-17 DIAGNOSIS — Y939 Activity, unspecified: Secondary | ICD-10-CM | POA: Insufficient documentation

## 2015-01-17 DIAGNOSIS — W19XXXA Unspecified fall, initial encounter: Secondary | ICD-10-CM

## 2015-01-17 DIAGNOSIS — M810 Age-related osteoporosis without current pathological fracture: Secondary | ICD-10-CM | POA: Diagnosis not present

## 2015-01-17 DIAGNOSIS — W1839XA Other fall on same level, initial encounter: Secondary | ICD-10-CM | POA: Diagnosis not present

## 2015-01-17 DIAGNOSIS — Z79899 Other long term (current) drug therapy: Secondary | ICD-10-CM | POA: Insufficient documentation

## 2015-01-17 DIAGNOSIS — I5020 Unspecified systolic (congestive) heart failure: Secondary | ICD-10-CM | POA: Insufficient documentation

## 2015-01-17 MED ORDER — HYDROCODONE-ACETAMINOPHEN 5-325 MG PO TABS
1.0000 | ORAL_TABLET | Freq: Once | ORAL | Status: AC
  Administered 2015-01-17: 1 via ORAL
  Filled 2015-01-17: qty 1

## 2015-01-17 NOTE — ED Provider Notes (Signed)
CSN: [id number]     Arrival date & time 01/17/15  1424 History   First MD Initiated Contact with Patient 01/17/15 1625     Chief Complaint  Patient presents with  . Fall     (Consider location/radiation/quality/duration/timing/severity/associated sxs/prior Treatment) Patient is a 82 y.o. female presenting with fall. The history is provided by the patient and a relative. No language interpreter was used.  Fall This is a new problem. The current episode started yesterday. The problem occurs rarely. The problem has not changed since onset.Pertinent negatives include no chest pain, no abdominal pain, no headaches and no shortness of breath. The symptoms are aggravated by walking. The symptoms are relieved by rest. Treatments tried: norco. The treatment provided no relief.    Past Medical History  Diagnosis Date  . SPINAL STENOSIS   . LOW BACK PAIN     chronic, follows with Nsurg for ESI  . FRACTURE, PELVIS, RIGHT 12/2009  . HIP FRACTURE, LEFT 09/2009    s/p L THA  . ALLERGIC RHINITIS   . Carpal tunnel syndrome   . Hypertension   . HYPERLIPIDEMIA   . GERD   . Arthritis   . Osteoporosis   . Atrial fibrillation     no anticoag due to fall risk/age  . DEPRESSION   . CORONARY ARTERY DISEASE 2004    a.  LHC (01/2003):  3 v CAD => s/p CABG.;   b.  Myoview (02/2013):  Anterior scar; no ischemia; EF 49%.    . Systolic congestive heart failure   . 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 Echo (02/2013):  EF 25-30%.;   b.  Echo (04/04/13):  Mild focal basal septal hypertrophy, EF 60-65%, no RWMA, Gr 2 DD, mild to mod MAC, mild bileaflet MVP, mild to mod MR, mild to mod LAE, mild RAE, PASP 48.  [name] H/O Doppler ultrasound     Renal Artery [place] (09/2010):  No RAS.  . Carotid artery occlusion     Carotid US (05/2010):  RICA 70-99%; LICA 0-49%  . Anemia    Past Surgical History  Procedure Laterality Date  . (l) hip hemiarthroplasty  09/2009  . Appendectomy  1940  . Eye surgery      both  cataracts  . Coronary artery bypass graft  2004  . Carpal tunnel release  2004    right and left-dsc  . Ulnar nerve transposition  2009    lt   . Vein ligation      legs  . Colonoscopy    . I&d extremity Right 12/26/2012    Procedure: IRRIGATION AND DEBRIDEMENT OF RIGHT LEG, SURGICAL PREP PLACEMENT OF A CELL AND VAC   ;  Surgeon: Wayland Denis, DO;  Location: [facility];  Service: Plastics;  Laterality: Right;   Family History  Problem Relation Age of Onset  . Arthritis Mother   . Breast cancer Mother   . Heart disease Mother   . Hypertension Mother   . Arthritis Father   . Heart disease Father   . Hypertension Father   . Arthritis Maternal Grandmother   . Arthritis Maternal Grandfather   . Arthritis Paternal Grandmother   . Arthritis Paternal Grandfather   . Breast cancer Daughter    History  Substance Use Topics  . Smoking status: Former Smoker    Quit date: 07/10/1978  . Smokeless tobacco: Never Used     Comment: Lives alone in indep living facility @ [facility]. Active ADLS  but does not drive dur to macular degen. supportive family members nearby  . Alcohol Use: No   OB History    No data available     Review of Systems  Constitutional: Negative for fever, chills, diaphoresis, activity change, appetite change and fatigue.  HENT: Negative for congestion, facial swelling, rhinorrhea and sore throat.   Eyes: Negative for photophobia and discharge.  Respiratory: Negative for cough, chest tightness and shortness of breath.   Cardiovascular: Negative for chest pain, palpitations and leg swelling.  Gastrointestinal: Negative for nausea, vomiting, abdominal pain and diarrhea.  Endocrine: Negative for polydipsia and polyuria.  Genitourinary: Negative for dysuria, frequency, difficulty urinating and pelvic pain.  Musculoskeletal: Negative for back pain, arthralgias, neck pain and neck stiffness.  Skin: Negative for color change and wound.    Allergic/Immunologic: Negative for immunocompromised state.  Neurological: Negative for facial asymmetry, weakness, numbness and headaches.  Hematological: Does not bruise/bleed easily.  Psychiatric/Behavioral: Negative for confusion and agitation.      Allergies  Amoxicillin; Sporanox; and Sulfa antibiotics  Home Medications   Prior to Admission medications   Medication Sig Start Date End Date Taking? Authorizing Provider  ALPRAZolam (XANAX) 0.25 MG tablet Take 1 tablet (0.25 mg total) by mouth at bedtime as needed for sleep. 07/14/14  Yes Newt Lukes, MD  aspirin 81 MG tablet Take 81 mg by mouth at bedtime.    Yes Historical Provider, MD  docusate sodium (DOK) 100 MG capsule Take 1 capsule (100 mg total) by mouth daily. 12/12/14  Yes Newt Lukes, MD  fentaNYL (DURAGESIC) 50 MCG/HR Place 1 patch (50 mcg total) onto the skin every 3 (three) days. 12/24/14 12/22/15 Yes Judie Bonus, MD  furosemide (LASIX) 20 MG tablet TAKE ONE TABLET EACH DAY Patient taking differently: TAKE 20 MG BY MOUTH DAILY 11/12/14  Yes Lewayne Bunting, MD  HYDROcodone-acetaminophen (NORCO/VICODIN) 5-325 MG per tablet Take 1 tablet by mouth every 6 (six) hours as needed. Patient taking differently: Take 2 tablets by mouth 2 (two) times daily.  11/23/14  Yes Newt Lukes, MD  metoprolol tartrate (LOPRESSOR) 25 MG tablet TAKE 1/2 TABLET TWICE DAILY Patient taking differently: TAKE 12.5 MG BY MOUTH TWICE DAILY 10/21/14  Yes Lewayne Bunting, MD  Multiple Vitamin (MULTIVITAMIN WITH MINERALS) TABS Take 1 tablet by mouth daily.   Yes Historical Provider, MD  Omega-3 Fatty Acids (FISH OIL) 1000 MG CAPS Take 1,000 mg by mouth daily.    Yes Historical Provider, MD  pantoprazole (PROTONIX) 40 MG tablet TAKE ONE TABLET BY MOUTH ONCE DAILY Patient taking differently: TAKE 40 MG BY MOUTH DAILY 10/21/14  Yes Lewayne Bunting, MD  potassium chloride SA (K-DUR,KLOR-CON) 20 MEQ tablet Take 1 tablet (20 mEq total)  by mouth daily. 12/28/14  Yes Newt Lukes, MD  pravastatin (PRAVACHOL) 40 MG tablet TAKE ONE TABLET EVERY EVENING Patient taking differently: TAKE 40 MG BY MOUTH EVERY EVENING 12/11/14  Yes Lewayne Bunting, MD  pregabalin (LYRICA) 50 MG capsule Take 1 capsule (50 mg total) by mouth 3 (three) times daily. 09/14/14  Yes Newt Lukes, MD  PROCTOSOL HC 2.5 % rectal cream APPLY RECTALLY 2 TIMES DAILY 01/12/15  Yes Judie Bonus, MD  venlafaxine XR (EFFEXOR-XR) 75 MG 24 hr capsule Take 1 capsule (75 mg total) by mouth daily with breakfast. 12/28/14  Yes Newt Lukes, MD  zolpidem (AMBIEN) 5 MG tablet Take 1 tablet (5 mg total) by mouth at bedtime as needed. Patient  taking differently: Take 5 mg by mouth at bedtime as needed for sleep.  01/13/15  Yes Veryl Speak, FNP  nitroGLYCERIN (NITROSTAT) 0.4 MG SL tablet Place 1 tablet (0.4 mg total) under the tongue every 5 (five) minutes as needed for chest pain (upt o 3 doses). Patient not taking: Reported on 01/17/2015 02/12/13   Hillis Range, MD   BP 138/54 mmHg  Pulse 91  Temp(Src) 98.8 F (37.1 C) (Oral)  Resp 16  SpO2 91% Physical Exam  Constitutional: She is oriented to person, place, and time. She appears well-developed and well-nourished. No distress.  HENT:  Head: Normocephalic and atraumatic.  Mouth/Throat: No oropharyngeal exudate.  Eyes: Pupils are equal, round, and reactive to light.  Neck: Normal range of motion. Neck supple.  Cardiovascular: Normal rate, regular rhythm and normal heart sounds.  Exam reveals no gallop and no friction rub.   No murmur heard. Pulmonary/Chest: Effort normal and breath sounds normal. No respiratory distress. She has no wheezes. She has no rales.  Abdominal: Soft. Bowel sounds are normal. She exhibits no distension and no mass. There is no tenderness. There is no rebound and no guarding.  Musculoskeletal: She exhibits no edema.       Left hip: She exhibits decreased range of motion and  tenderness (with passive flexion). She exhibits normal strength.       Legs: Neurological: She is alert and oriented to person, place, and time.  Skin: Skin is warm and dry.  Psychiatric: She has a normal mood and affect.    ED Course  Procedures (including critical care time) Labs Review Labs Reviewed - No data to display  Imaging Review Dg Pelvis 1-2 Views  01/17/2015   CLINICAL DATA:  Fall at home yesterday.  Left hip pain.  EXAM: PELVIS - 1-2 VIEW  COMPARISON:  01/02/2010  FINDINGS: Severe bony demineralization. Pelvic deformity from old fractures most notable in the right pubic rami. Heterotopic ossification in the vicinity of the left hip, stable. Left hip hemiarthroplasty observed. The stem of the hemiarthroplasty is only partially visualized.  Prominent lower lumbar degenerative disc disease and spondylosis with rotary levoconvex scoliosis.  The bony 10 demineralization and superimposed loops of bowel and bowel contents obscure the sacrum and parts of the iliac bones.  IMPRESSION: 1. I do not see an acute fracture. Old right pelvic fractures. Left hip hemiarthroplasty ; the stem of the hemi arthroplasty is not completely included on today's image, and left femur radiography may be warranted if the patient is having left hip pain. 2. Heterotopic ossification along the left hip, chronic. 3. Considerable bony demineralization. 4. Lumbar spondylosis, scoliosis, and degenerative disc disease.   Electronically Signed   By: Gaylyn Rong M.D.   On: 01/17/2015 17:11   Dg Sacrum/coccyx  01/17/2015   CLINICAL DATA:  81 year old female status post fall  EXAM: SACRUM AND COCCYX - 2+ VIEW  COMPARISON:  Radiograph dated 01/17/2015  FINDINGS: Evaluation is limited due to osteopenia and extensive degenerative changes.  There is a total left hip arthroplasty. There is osteopenia with degenerative changes of the spine. Old right pubic bone fracture noted. No definite evidence of acute fracture.   IMPRESSION: Osteopenia with degenerative changes.  No definite acute fracture.   Electronically Signed   By: Elgie Collard M.D.   On: 01/17/2015 17:37   Ct Pelvis Wo Contrast  01/17/2015   CLINICAL DATA:  Fall yesterday.  Pain in the left hip.  EXAM: CT PELVIS WITHOUT CONTRAST  TECHNIQUE:  Multidetector CT imaging of the pelvis was performed following the standard protocol without intravenous contrast.  COMPARISON:  Multiple exams, including 09/29/2009  FINDINGS: Injection granulomas along the buttocks. Degenerative findings along the sacroiliac joints. Bony deformity and irregularity in the fifth sacral segment compatible with age-indeterminate fracture, not present on 09/29/2009.  Acute left inferior pubic ramus nondisplaced fracture. Suspected fracture in the vicinity of the anterior wall left acetabulum or medial portion of the left superior pubic ramus, although this is less well seen.  Grade 1 anterolisthesis at L4-5 and prominent degenerative disc disease and spondylosis at L4-5 and L5-S1, with osseous foraminal stenosis being relatively severe at L5-S1 on the left.  Aortoiliac atherosclerotic vascular disease. Cholelithiasis is present. Sigmoid diverticulosis. Injection granulomas are present. Heterotopic ossification extending along the left distal gluteus medius.  Left hip hemiarthroplasty in place. We did not include the entire stem of the hemiarthroplasty.  IMPRESSION: 1. Acute nondisplaced fracture of the left inferior pubic ramus. Suspected probable nondisplaced fracture of the superior pubic ramus medially, or anterior wall left acetabulum. Deformity in the S5 vertebral segment of the sacrum compatible with age-indeterminate fracture. 2. Left hip hemiarthroplasty in place. The entire stem of the hemiarthroplasty was not included today. 3. Old right pubic ramus deformities from old fractures. 4. Lumbar spondylosis and degenerative disc disease, causing severe left foraminal stenosis at L5-S1.    Electronically Signed   By: Gaylyn RongWalter  Liebkemann M.D.   On: 01/17/2015 18:15     EKG Interpretation None      MDM   Final diagnoses:  Fracture of multiple pubic rami, left, closed, initial encounter  Fall from standing, initial encounter    Pt is a 81 y.o. female with Pmhx as above who presents with L hip/groin pain since mechanical fall yesterday.  She denies head injury, loss of consciousness.  She also denies recent illness including fevers, chills, cough, chest pain, shortness of breath, palpitations.  On physical exam, vital signs are stable.  She is in no acute distress.  She has no reproducible pain on physical exam, though does have pain in her left groin with passive flexion of the left hip.  She is a history of a left-sided partial hip replacement due to a fall.  7:45 PM Pt ambulated with walker in hall w/o difficulty.  I spoke to the on-call social worker a face-to-face evaluation with PT/INR and has been placed.  Patient is an independent living facility that has leg is CPT in-house.  On-call social worker will fax over information to get set up with us tomorrow morning.  Patient and family happy with the plan.  They will call Dr. Greig RightMurphy's office tomorrow to set up follow-up appointment.  Lajoyce A Parzych evaluation in the Emergency Department is complete. It has been determined that no acute conditions requiring further emergency intervention are present at this time. The patient/guardian have been advised of the diagnosis and plan. We have discussed signs and symptoms that warrant return to the ED, such as changes or worsening in symptoms, uncontrolled pain, fever, numbness, weakness      Toy CookeyMegan Docherty, MD 01/17/15 2040

## 2015-01-17 NOTE — Care Management Note (Signed)
Case Management Note  Patient Details  Name: Julia Black MRN: [id number] Date of Birth: 12/16/1918  Subjective/Objective:   81 y.o. F who [facility] experiencing L groin and hip pain after fall on 01/16/15. Ambulated in room while in ED w/o difficulty. Would like HHRN, HHPT with [facility] who provides these services in house at that particular facility. Will fax paperwork first thing 01/18/15.    Action/Plan: HHRN, HHPT via [facility]. No further needs at present.    Expected Discharge Date:                  Expected Discharge Plan:  Home w Home Health Services  In-House Referral:     Discharge planning Services  CM Consult  Post Acute Care Choice:    Choice offered to:  Patient (pt lives in [place] [facility])  DME Arranged:    DME Agency:     HH Arranged:  RN, PT HH Agency:     Status of Service:  In process, will continue to follow  Medicare Important Message Given:    Date Medicare IM Given:    Medicare IM give by:    Date Additional Medicare IM Given:    Additional Medicare Important Message give by:     If discussed at Long Length of Stay Meetings, dates discussed:    Additional Comments:  Julia Black, Julia Kleve M, RN 01/17/2015, 9:53 PM

## 2015-01-17 NOTE — Care Management Note (Addendum)
Case Management Note  Patient Details  Name: Julia Black MRN: [id number] Date of Birth: 09/03/1918  Subjective/Objective:                    Action/Plan:   Expected Discharge Date:                  Expected Discharge Plan:  Home w Home Health Services  In-House Referral:     Discharge planning Services  CM Consult  Post Acute Care Choice:    Choice offered to:  [facility]  DME Arranged:    DME Agency:     HH Arranged:  RN, PT HH Agency:     Status of Service:  In process, will continue to follow  Medicare Important Message Given:    Date Medicare IM Given:    Medicare IM give by:    Date Additional Medicare IM Given:    Additional Medicare Important Message give by:     If discussed at Long Length of Stay Meetings, dates discussed:    Additional Comments:  Julia Black, Julia Denardo M, RN 01/17/2015, 10:00 PM

## 2015-01-17 NOTE — Discharge Instructions (Signed)
Stable Pelvic Fracture You have one or more fractures (this means there is a break in the bones) of the pelvis. The pelvis is the ring of bones that make up your hipbones. These are the bones you sit on and the lower part of the spine. It is like a boney ring where your legs attach and which supports your upper body. You have an undisplaced fracture. This means the bones are in good position. The pelvic fracture you have is a simple (uncomplicated) fracture. DIAGNOSIS  X-rays usually diagnose these fractures. TREATMENT  The goal of treating pelvic fractures is to get the bones to heal in a good position. The patient should return to normal activities as soon as possible. Such fractures are often treated with normal bed rest and conservative measures.  HOME CARE INSTRUCTIONS   You should be on bed rest for as long as directed by your caregiver. Change positions of your legs every 1-2 hours to maintain good blood flow. You may sit as long as is tolerable. Following this, you may do usual activities, but avoid strenuous activities for as long as directed by your caregiver.  Only take over-the-counter or prescription medicines for pain, discomfort, or fever as directed by your caregiver.  Bed rest may also be used for discomfort.  Resume your activities when you are able. Use a cane or crutch on the injured side to reduce pain while walking, as needed.  If you develop increased pain or discomfort not relieved with medications, contact your caregiver.  Warning: Do not drive a car or operate a motor vehicle until your caregiver specifically tells you it is safe to do so. SEEK IMMEDIATE MEDICAL CARE IF:   You feel light-headed or faint, develop chest pain or shortness of breath.  An unexplained oral temperature above 102 F (38.9 C) develops.  You develop blood in the urine or in the stools.  There is difficulty urinating, and/or having a bowel movement, or pain with these efforts.  There is a  difficulty or increased pain with walking.  There is swelling in one or both legs that is not normal. Document Released: 09/04/2001 Document Revised: 11/10/2013 Document Reviewed: 02/07/2008 [facility] Patient Information 2015 [employer]. This information is not intended to replace advice given to you by your health care provider. Make sure you discuss any questions you have with your health care provider.   Fall Prevention and Home Safety Falls cause injuries and can affect all age groups. It is possible to use preventive measures to significantly decrease the likelihood of falls. There are many simple measures which can make your home safer and prevent falls. OUTDOORS  Repair cracks and edges of walkways and driveways.  Remove high doorway thresholds.  Trim shrubbery on the main path into your home.  Have good outside lighting.  Clear walkways of tools, rocks, debris, and clutter.  Check that handrails are not broken and are securely fastened. Both sides of steps should have handrails.  Have leaves, snow, and ice cleared regularly.  Use sand or salt on walkways during winter months.  In the garage, clean up grease or oil spills. BATHROOM  Install night lights.  Install grab bars by the toilet and in the tub and shower.  Use non-skid mats or decals in the tub or shower.  Place a plastic non-slip stool in the shower to sit on, if needed.  Keep floors dry and clean up all water on the floor immediately.  Remove soap buildup in the tub  or shower on a regular basis.  Secure bath mats with non-slip, double-sided rug tape.  Remove throw rugs and tripping hazards from the floors. BEDROOMS  Install night lights.  Make sure a bedside light is easy to reach.  Do not use oversized bedding.  Keep a telephone by your bedside.  Have a firm chair with side arms to use for getting dressed.  Remove throw rugs and tripping hazards from the floor. KITCHEN  Keep handles on  pots and pans turned toward the center of the stove. Use back burners when possible.  Clean up spills quickly and allow time for drying.  Avoid walking on wet floors.  Avoid hot utensils and knives.  Position shelves so they are not too high or low.  Place commonly used objects within easy reach.  If necessary, use a sturdy step stool with a grab bar when reaching.  Keep electrical cables out of the way.  Do not use floor polish or wax that makes floors slippery. If you must use wax, use non-skid floor wax.  Remove throw rugs and tripping hazards from the floor. STAIRWAYS  Never leave objects on stairs.  Place handrails on both sides of stairways and use them. Fix any loose handrails. Make sure handrails on both sides of the stairways are as long as the stairs.  Check carpeting to make sure it is firmly attached along stairs. Make repairs to worn or loose carpet promptly.  Avoid placing throw rugs at the top or bottom of stairways, or properly secure the rug with carpet tape to prevent slippage. Get rid of throw rugs, if possible.  Have an electrician put in a light switch at the top and bottom of the stairs. OTHER FALL PREVENTION TIPS  Wear low-heel or rubber-soled shoes that are supportive and fit well. Wear closed toe shoes.  When using a stepladder, make sure it is fully opened and both spreaders are firmly locked. Do not climb a closed stepladder.  Add color or contrast paint or tape to grab bars and handrails in your home. Place contrasting color strips on first and last steps.  Learn and use mobility aids as needed. Install an electrical emergency response system.  Turn on lights to avoid dark areas. Replace light bulbs that burn out immediately. Get light switches that glow.  Arrange furniture to create clear pathways. Keep furniture in the same place.  Firmly attach carpet with non-skid or double-sided tape.  Eliminate uneven floor surfaces.  Select a carpet  pattern that does not visually hide the edge of steps.  Be aware of all pets. OTHER HOME SAFETY TIPS  Set the water temperature for 120 F (48.8 C).  Keep emergency numbers on or near the telephone.  Keep smoke detectors on every level of the home and near sleeping areas. Document Released: 06/16/2002 Document Revised: 12/26/2011 Document Reviewed: 09/15/2011 [facility] Patient Information 2015 [place], [place]. This information is not intended to replace advice given to you by your health care provider. Make sure you discuss any questions you have with your health care provider.

## 2015-01-17 NOTE — ED Notes (Signed)
Pt was able to ambulate for about 15 feet with walker.

## 2015-01-17 NOTE — ED Notes (Signed)
Pt tripped and fell to her bottom while "stooped over to pick up something" yesterday, now c/o buttocks pain and left lower abdomen/groin pain. Pt states she landed in a sitting position and never fell fully to the ground. Denies n/v/diarrhea.

## 2015-01-18 ENCOUNTER — Encounter: Payer: Self-pay | Admitting: Internal Medicine

## 2015-01-18 ENCOUNTER — Ambulatory Visit: Payer: Medicare Other | Admitting: Internal Medicine

## 2015-01-18 NOTE — Care Management Note (Addendum)
Case Management Note  Patient Details  Name: Julia Black MRN: [id number] Date of Birth: 05/19/1919  Subjective/Objective:      [facility] for fall noted orders for HHRN/PT with face to face               Action/Plan:  CM contact with weekend Cm.  [phone] [facility]  CM left message for Joesph Fillersim H of [facility] [phone] for home health service referral  [id number] Confirmation from Tim H Referral received  Expected Discharge Date:                  Expected Discharge Plan:  Home w Home Health Services  In-House Referral:     Discharge planning Services  CM Consult  Post Acute Care Choice:    Choice offered to:  Patient (pt lives in [place] [facility])   HH Arranged:  RN, PT [facility] Agency:   [facility]  Status of Service: completed  Ophelia ShoulderGibbs, Kit Mollett Louise, RN 01/18/2015, 10:34 AM

## 2015-01-20 ENCOUNTER — Encounter: Payer: Self-pay | Admitting: Internal Medicine

## 2015-01-21 ENCOUNTER — Telehealth: Payer: Self-pay | Admitting: Internal Medicine

## 2015-01-21 ENCOUNTER — Other Ambulatory Visit: Payer: Self-pay | Admitting: Internal Medicine

## 2015-01-21 MED ORDER — HYDROCODONE-ACETAMINOPHEN 5-325 MG PO TABS: 1.0000 | ORAL_TABLET | Freq: Four times a day (QID) | ORAL | Status: DC | PRN

## 2015-01-21 NOTE — Telephone Encounter (Signed)
Printed and signed, please remind about the 24-48 hours narcotic policy and please in the future not to call when already close to out of medicine.

## 2015-01-21 NOTE — Telephone Encounter (Signed)
Admitting for nursing services 2 times this week and then 1 time the next 4 wks and  medication teaching [facility] [phone]

## 2015-01-21 NOTE — Telephone Encounter (Signed)
Daughter has called back in regards.

## 2015-01-21 NOTE — Telephone Encounter (Signed)
Patients daughter sent an email last night about requesting a refill for her HYDROcodone-acetaminophen (NORCO/VICODIN) 5-325 MG per tablet [[id number]][[id number]]  She had fallen and had a couple broken bones in her hip and along with her regular back pain she is hurting pretty bad  She only has enough for today and is hoping to get this prescription this afternoon.

## 2015-01-22 ENCOUNTER — Ambulatory Visit: Payer: Medicare Other | Admitting: Internal Medicine

## 2015-01-22 NOTE — Telephone Encounter (Signed)
Yes - vo as above thanks

## 2015-01-25 ENCOUNTER — Encounter: Payer: Self-pay | Admitting: Internal Medicine

## 2015-01-25 MED ORDER — FENTANYL 50 MCG/HR TD PT72: 50.0000 ug | MEDICATED_PATCH | TRANSDERMAL | Status: DC

## 2015-01-26 ENCOUNTER — Encounter: Payer: Self-pay | Admitting: Internal Medicine

## 2015-01-29 LAB — 2D ECHOCARDIOGRAM WO CONTRAST

## 2015-02-02 DIAGNOSIS — S3282XD Multiple fractures of pelvis without disruption of pelvic ring, subsequent encounter for fracture with routine healing: Secondary | ICD-10-CM

## 2015-02-08 ENCOUNTER — Ambulatory Visit: Payer: Medicare Other | Admitting: Internal Medicine

## 2015-02-17 ENCOUNTER — Other Ambulatory Visit: Payer: Self-pay | Admitting: Internal Medicine

## 2015-02-17 ENCOUNTER — Encounter: Payer: Self-pay | Admitting: Internal Medicine

## 2015-02-17 ENCOUNTER — Telehealth: Payer: Self-pay | Admitting: *Deleted

## 2015-02-17 MED ORDER — FENTANYL 50 MCG/HR TD PT72: 50.0000 ug | MEDICATED_PATCH | TRANSDERMAL | Status: DC

## 2015-02-17 MED ORDER — HYDROCODONE-ACETAMINOPHEN 5-325 MG PO TABS: 1.0000 | ORAL_TABLET | Freq: Four times a day (QID) | ORAL | Status: DC | PRN

## 2015-02-17 NOTE — Telephone Encounter (Signed)
A user error has taken place: wrong chart

## 2015-02-17 NOTE — Telephone Encounter (Signed)
Pt been trying to get refills on her ambien. Is this ok...[name]

## 2015-02-18 ENCOUNTER — Encounter: Payer: Self-pay | Admitting: *Deleted

## 2015-02-18 MED ORDER — ZOLPIDEM TARTRATE 5 MG PO TABS: 10.0000 mg | ORAL_TABLET | Freq: Every evening | ORAL | Status: DC | PRN

## 2015-02-18 NOTE — Telephone Encounter (Signed)
Couldn't locate printed script so i call refill into pharmacy. Also notified pt rx has been call to [facility]...[name]

## 2015-02-18 NOTE — Telephone Encounter (Signed)
done

## 2015-02-18 NOTE — Telephone Encounter (Signed)
Receive call from pt checking status on her ambien...Julia Black

## 2015-02-22 DIAGNOSIS — S3289XS Fracture of other parts of pelvis, sequela: Secondary | ICD-10-CM

## 2015-02-23 ENCOUNTER — Ambulatory Visit: Payer: Medicare Other | Admitting: Internal Medicine

## 2015-02-27 ENCOUNTER — Other Ambulatory Visit: Payer: Self-pay | Admitting: Internal Medicine

## 2015-03-11 ENCOUNTER — Encounter: Payer: Self-pay | Admitting: Internal Medicine

## 2015-03-16 ENCOUNTER — Ambulatory Visit (INDEPENDENT_AMBULATORY_CARE_PROVIDER_SITE_OTHER)
Admission: RE | Admit: 2015-03-16 | Discharge: 2015-03-16 | Disposition: A | Discharge status: Home / Self Care | Payer: Medicare Other | Source: Ambulatory Visit | Attending: Internal Medicine | Admitting: Internal Medicine

## 2015-03-16 ENCOUNTER — Ambulatory Visit (INDEPENDENT_AMBULATORY_CARE_PROVIDER_SITE_OTHER): Payer: Medicare Other | Admitting: Internal Medicine

## 2015-03-16 ENCOUNTER — Encounter: Payer: Self-pay | Admitting: Internal Medicine

## 2015-03-16 VITALS — BP 124/64 | HR 74 | Temp 98.2°F | Wt 105.5 lb

## 2015-03-16 DIAGNOSIS — R059 Cough, unspecified: Secondary | ICD-10-CM

## 2015-03-16 DIAGNOSIS — R197 Diarrhea, unspecified: Secondary | ICD-10-CM

## 2015-03-16 DIAGNOSIS — I2510 Atherosclerotic heart disease of native coronary artery without angina pectoris: Secondary | ICD-10-CM

## 2015-03-16 DIAGNOSIS — R300 Dysuria: Secondary | ICD-10-CM | POA: Diagnosis not present

## 2015-03-16 DIAGNOSIS — R05 Cough: Secondary | ICD-10-CM | POA: Diagnosis not present

## 2015-03-16 MED ORDER — FUROSEMIDE 20 MG PO TABS: ORAL_TABLET | ORAL | Status: DC

## 2015-03-16 MED ORDER — PRAVASTATIN SODIUM 40 MG PO TABS: 40.0000 mg | ORAL_TABLET | Freq: Every evening | ORAL | Status: AC

## 2015-03-16 MED ORDER — HYDROCODONE-ACETAMINOPHEN 5-325 MG PO TABS: 1.0000 | ORAL_TABLET | Freq: Four times a day (QID) | ORAL | Status: DC | PRN

## 2015-03-16 MED ORDER — NITROGLYCERIN 0.4 MG SL SUBL: 0.4000 mg | SUBLINGUAL_TABLET | SUBLINGUAL | Status: AC | PRN

## 2015-03-16 MED ORDER — PANTOPRAZOLE SODIUM 40 MG PO TBEC
40.0000 mg | DELAYED_RELEASE_TABLET | Freq: Every day | ORAL | Status: AC
Start: 2015-03-16 — End: ?

## 2015-03-16 MED ORDER — ALPRAZOLAM 0.25 MG PO TABS: 0.2500 mg | ORAL_TABLET | Freq: Two times a day (BID) | ORAL | Status: AC | PRN

## 2015-03-16 MED ORDER — METOPROLOL TARTRATE 25 MG PO TABS: 12.5000 mg | ORAL_TABLET | Freq: Two times a day (BID) | ORAL | Status: AC

## 2015-03-16 NOTE — Progress Notes (Signed)
Subjective:    Patient ID: Julia Black, female    DOB: 1919-01-26, 81 y.o.   MRN: [id number]  HPI  Patient here for follow-up. Reviewed chronic issues, interval events and current concerns including cough, fatigue, need for refills, diarrhea and possible urinary tract infection  Past Medical History  Diagnosis Date  . SPINAL STENOSIS   . LOW BACK PAIN     chronic, follows with Nsurg for ESI  . FRACTURE, PELVIS, RIGHT 12/2009  . HIP FRACTURE, LEFT 09/2009    s/p L THA  . ALLERGIC RHINITIS   . Carpal tunnel syndrome   . Hypertension   . HYPERLIPIDEMIA   . GERD   . Arthritis   . Osteoporosis   . Atrial fibrillation     no anticoag due to fall risk/age  . DEPRESSION   . CORONARY ARTERY DISEASE 2004    a.  LHC (01/2003):  3 v CAD => s/p CABG.;   b.  Myoview (02/2013):  Anterior scar; no ischemia; EF 49%.    . Systolic congestive heart failure   . 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 Echo (02/2013):  EF 25-30%.;   b.  Echo (04/04/13):  Mild focal basal septal hypertrophy, EF 60-65%, no RWMA, Gr 2 DD, mild to mod MAC, mild bileaflet MVP, mild to mod MR, mild to mod LAE, mild RAE, PASP 48.  [name] H/O Doppler ultrasound     Renal Artery [place] (09/2010):  No RAS.  . Carotid artery occlusion     Carotid US (05/2010):  RICA 70-99%; LICA 0-49%  . Anemia     Review of Systems  Constitutional: Positive for appetite change and fatigue. Negative for fever and unexpected weight change.  Respiratory: Positive for cough (x 2 days). Negative for wheezing.   Cardiovascular: Negative for chest pain.  Gastrointestinal: Positive for diarrhea (frequency, not liquid form) and constipation. Negative for vomiting and blood in stool. Abdominal pain: fully.  Genitourinary: Positive for frequency.  Psychiatric/Behavioral: The patient is nervous/anxious (occ).        Objective:    Physical Exam  Constitutional: She is oriented to person, place, and time. She appears well-developed and well-nourished.  No distress.  Ill and fatigued appearing  Cardiovascular: Normal rate, regular rhythm and normal heart sounds.   No murmur heard. Pulmonary/Chest: Effort normal. No respiratory distress. She has no wheezes. She has rales (deep congestion w/ cough).  Abdominal: Soft. Bowel sounds are normal. She exhibits no distension. There is no rebound and no guarding.  Musculoskeletal: She exhibits no edema.  Neurological: She is alert and oriented to person, place, and time.    BP 124/64 mmHg  Pulse 74  Temp(Src) 98.2 F (36.8 C) (Oral)  Wt 105 lb 8 oz (47.854 kg)  SpO2 89% Wt Readings from Last 3 Encounters:  03/16/15 105 lb 8 oz (47.854 kg)  12/24/14 104 lb (47.174 kg)  09/22/14 112 lb (50.803 kg)     Lab Results  Component Value Date   WBC 7.5 06/12/2014   HGB 13.4 06/12/2014   HCT 42.7 06/12/2014   PLT 146* 06/12/2014   GLUCOSE 70 09/22/2014   CHOL 131 01/08/2014   TRIG 95.0 01/08/2014   HDL 55.80 01/08/2014   LDLCALC 56 01/08/2014   ALT 49* 06/14/2014   AST 44* 06/14/2014   NA 142 09/22/2014   K 4.6 09/22/2014   CL 104 09/22/2014   CREATININE 0.74 09/22/2014   BUN 23 09/22/2014   CO2 29  09/22/2014   TSH 1.480 02/08/2013   INR 0.98 02/08/2013   HGBA1C  08/11/2010    5.4 (NOTE)                                                                       According to the ADA Clinical Practice Recommendations for 2011, when HbA1c is used as a screening test:   >=6.5%   Diagnostic of Diabetes Mellitus           (if abnormal result  is confirmed)  5.7-6.4%   Increased risk of developing Diabetes Mellitus  References:Diagnosis and Classification of Diabetes Mellitus,Diabetes Care,2011,34(Suppl 1):S62-S69 and Standards of Medical Care in         Diabetes - 2011,Diabetes Care,2011,34  (Suppl 1):S11-S61.    Dg Pelvis 1-2 Views  01/17/2015   CLINICAL DATA:  Fall at home yesterday.  Left hip pain.  EXAM: PELVIS - 1-2 VIEW  COMPARISON:  01/02/2010  FINDINGS: Severe bony demineralization.  Pelvic deformity from old fractures most notable in the right pubic rami. Heterotopic ossification in the vicinity of the left hip, stable. Left hip hemiarthroplasty observed. The stem of the hemiarthroplasty is only partially visualized.  Prominent lower lumbar degenerative disc disease and spondylosis with rotary levoconvex scoliosis.  The bony 10 demineralization and superimposed loops of bowel and bowel contents obscure the sacrum and parts of the iliac bones.  IMPRESSION: 1. I do not see an acute fracture. Old right pelvic fractures. Left hip hemiarthroplasty ; the stem of the hemi arthroplasty is not completely included on today's image, and left femur radiography may be warranted if the patient is having left hip pain. 2. Heterotopic ossification along the left hip, chronic. 3. Considerable bony demineralization. 4. Lumbar spondylosis, scoliosis, and degenerative disc disease.   Electronically Signed   By: Gaylyn Rong M.D.   On: 01/17/2015 17:11   Dg Sacrum/coccyx  01/17/2015   CLINICAL DATA:  81 year old female status post fall  EXAM: SACRUM AND COCCYX - 2+ VIEW  COMPARISON:  Radiograph dated 01/17/2015  FINDINGS: Evaluation is limited due to osteopenia and extensive degenerative changes.  There is a total left hip arthroplasty. There is osteopenia with degenerative changes of the spine. Old right pubic bone fracture noted. No definite evidence of acute fracture.  IMPRESSION: Osteopenia with degenerative changes.  No definite acute fracture.   Electronically Signed   By: Elgie Collard M.D.   On: 01/17/2015 17:37   Ct Pelvis Wo Contrast  01/17/2015   CLINICAL DATA:  Fall yesterday.  Pain in the left hip.  EXAM: CT PELVIS WITHOUT CONTRAST  TECHNIQUE: Multidetector CT imaging of the pelvis was performed following the standard protocol without intravenous contrast.  COMPARISON:  Multiple exams, including 09/29/2009  FINDINGS: Injection granulomas along the buttocks. Degenerative findings along  the sacroiliac joints. Bony deformity and irregularity in the fifth sacral segment compatible with age-indeterminate fracture, not present on 09/29/2009.  Acute left inferior pubic ramus nondisplaced fracture. Suspected fracture in the vicinity of the anterior wall left acetabulum or medial portion of the left superior pubic ramus, although this is less well seen.  Grade 1 anterolisthesis at L4-5 and prominent degenerative disc disease and spondylosis at L4-5 and L5-S1, with osseous foraminal stenosis being relatively  severe at L5-S1 on the left.  Aortoiliac atherosclerotic vascular disease. Cholelithiasis is present. Sigmoid diverticulosis. Injection granulomas are present. Heterotopic ossification extending along the left distal gluteus medius.  Left hip hemiarthroplasty in place. We did not include the entire stem of the hemiarthroplasty.  IMPRESSION: 1. Acute nondisplaced fracture of the left inferior pubic ramus. Suspected probable nondisplaced fracture of the superior pubic ramus medially, or anterior wall left acetabulum. Deformity in the S5 vertebral segment of the sacrum compatible with age-indeterminate fracture. 2. Left hip hemiarthroplasty in place. The entire stem of the hemiarthroplasty was not included today. 3. Old right pubic ramus deformities from old fractures. 4. Lumbar spondylosis and degenerative disc disease, causing severe left foraminal stenosis at L5-S1.   Electronically Signed   By: Gaylyn Rong M.D.   On: 01/17/2015 18:15       Assessment & Plan:   Cough x 48h - suspect viral syndrome given fatigue. O2 ok, Bisalar fine crackles - check CXR r/o CHF - consider abx given risk pulm infx in adv age, but rec only antitussive OTC for now pending CXR  Dysuria - check UA - tx for UTI if +  "Diarrhea" - frequent small hard "pellets" through the day - suspect constipation related to high dose narcotics and immobility - bowel sounds present on exam, NT, feb - advise MIralax until +BM  (full evacuation, even liquid), then follow up as needed if unimproved, sooner if worse  Problem List Items Addressed This Visit    Diarrhea   Dysuria   Relevant Orders   Urinalysis, Routine w reflex microscopic (not at [facility])    Other Visit Diagnoses    Cough    -  Primary    Relevant Orders    DG Chest 2 View        Rene Paci, MD

## 2015-03-16 NOTE — Progress Notes (Signed)
Pre visit review using our clinic review tool, if applicable. No additional management support is needed unless otherwise documented below in the visit note. 

## 2015-03-16 NOTE — Patient Instructions (Signed)
It was good to see you today.  We have reviewed your prior records including labs and tests today  Test(s) ordered today - xray and urine. Your results will be released to MyChart (or called to you) after review, usually within 72hours after test completion. If any changes need to be made, you will be notified at that same time.  Medications reviewed and updated, no changes recommended at this time. Refill on medication(s) as discussed today.  Please schedule followup in 3-4 months, call sooner if problems.

## 2015-03-17 ENCOUNTER — Other Ambulatory Visit (INDEPENDENT_AMBULATORY_CARE_PROVIDER_SITE_OTHER): Payer: Medicare Other

## 2015-03-17 DIAGNOSIS — R300 Dysuria: Secondary | ICD-10-CM

## 2015-03-17 LAB — URINALYSIS, ROUTINE W REFLEX MICROSCOPIC
Bilirubin Urine: NEGATIVE
LEUKOCYTES UA: NEGATIVE
Nitrite: NEGATIVE
Specific Gravity, Urine: <=1.005 — AB (ref 1.000–1.030)
URINE GLUCOSE: NEGATIVE
UROBILINOGEN UA: 0.2 (ref 0.0–1.0)
pH: 7 (ref 5.0–8.0)

## 2015-03-18 ENCOUNTER — Encounter: Payer: Self-pay | Admitting: Internal Medicine

## 2015-03-18 ENCOUNTER — Telehealth: Payer: Self-pay | Admitting: Cardiology

## 2015-03-18 ENCOUNTER — Other Ambulatory Visit: Payer: Self-pay | Admitting: Internal Medicine

## 2015-03-18 DIAGNOSIS — R3589 Other polyuria: Secondary | ICD-10-CM

## 2015-03-18 DIAGNOSIS — R300 Dysuria: Secondary | ICD-10-CM

## 2015-03-18 DIAGNOSIS — R358 Other polyuria: Secondary | ICD-10-CM

## 2015-03-18 NOTE — Telephone Encounter (Signed)
Communicated to Mia OK for patient to wait until 9/12 for evaluation/workup. She voiced understanding.

## 2015-03-18 NOTE — Telephone Encounter (Signed)
Fu as scheduled Julia Black   

## 2015-03-18 NOTE — Telephone Encounter (Signed)
Julia Black is calling because the nurse(Mia Salter)  Julia Black is asking for an order to do an EKG. Please call [phone]  Thanks

## 2015-03-18 NOTE — Telephone Encounter (Signed)
Returned call to Debbe Bales, RN w/ [facility].  She states pt assessment done today, pt BP 130/50s, HR 56 - noted HR felt "very irregular" on assessment.  Pt also has cough and basular crackling in lungs. CXR done yesterday which she reports was unremarkable.  Mia ask if advised to do EKG over concern about irreg HR - could have mobile team come out to perform as is hard for patient to leave home.  No active concerns from patient.  Note: Patient has appt w/ Dr. Jens Som on 9/12. Will route for advice.

## 2015-03-19 ENCOUNTER — Other Ambulatory Visit: Payer: Medicare Other

## 2015-03-19 DIAGNOSIS — R300 Dysuria: Secondary | ICD-10-CM

## 2015-03-19 DIAGNOSIS — R358 Other polyuria: Secondary | ICD-10-CM

## 2015-03-19 DIAGNOSIS — R3589 Other polyuria: Secondary | ICD-10-CM

## 2015-03-19 NOTE — Telephone Encounter (Signed)
Spoke to pt and she is having worsening sx for UTI. Advised that we could get her a cup to collect another sample if she felt that she needed to have another UA ran. Orders entered for same.

## 2015-03-19 NOTE — Progress Notes (Signed)
HPI: FU CAD s/p CABG in 2004, CHF, carotid stenosis, HTN, HL, atrial fibrillation. She was admitted in 02/2013 with elevated troponin in the setting of CHF. EF was 25-30% with anteroseptal and apical AK on echo. This was felt to represent stress-induced cardiomyopathy. Conservative measures were felt to be appropriate if possible given advanced age. She improved with medical Rx. Myoview 02/2013 was low risk without ischemia. She did have evidence of anterior scar. EF was 49%. She is not anticoagulated for atrial fibrillation due to history of fall risk. Admitted 12/15 with pneumonia and NSTEMI; treated conservatively for MI at her request. Last echo 3/16 showed normal LV function, grade 1 diastolic dysfunction, mild MR, moderate LAE, mild RAE. Since last seen, she has developed a recent cold. She has minimal cough that's associated with shortness of breath and chest tightness. Otherwise she denies dyspnea, chest pain, palpitations, syncope or increasing pedal edema.  Current Outpatient Prescriptions  Medication Sig Dispense Refill  . ALPRAZolam (XANAX) 0.25 MG tablet Take 1 tablet (0.25 mg total) by mouth 2 (two) times daily as needed for anxiety. 30 tablet 5  . aspirin 81 MG tablet Take 81 mg by mouth at bedtime.     . docusate sodium (DOK) 100 MG capsule Take 1 capsule (100 mg total) by mouth daily. 60 capsule 11  . fentaNYL (DURAGESIC) 50 MCG/HR Place 1 patch (50 mcg total) onto the skin every 3 (three) days. 10 patch 0  . furosemide (LASIX) 20 MG tablet TAKE 20 MG BY MOUTH DAILY 30 tablet 5  . HYDROcodone-acetaminophen (NORCO/VICODIN) 5-325 MG per tablet Take 1 tablet by mouth every 6 (six) hours as needed. 120 tablet 0  . metoprolol tartrate (LOPRESSOR) 25 MG tablet Take 0.5 tablets (12.5 mg total) by mouth 2 (two) times daily. 30 tablet 6  . Multiple Vitamin (MULTIVITAMIN WITH MINERALS) TABS Take 1 tablet by mouth daily.    . nitroGLYCERIN (NITROSTAT) 0.4 MG SL tablet Place 1 tablet (0.4 mg  total) under the tongue every 5 (five) minutes as needed for chest pain (upt o 3 doses). 25 tablet 4  . pantoprazole (PROTONIX) 40 MG tablet Take 1 tablet (40 mg total) by mouth daily. 30 tablet 6  . potassium chloride SA (K-DUR,KLOR-CON) 20 MEQ tablet Take 1 tablet (20 mEq total) by mouth daily. 90 tablet 3  . pravastatin (PRAVACHOL) 40 MG tablet Take 1 tablet (40 mg total) by mouth every evening. 90 tablet 1  . pregabalin (LYRICA) 50 MG capsule Take 1 capsule (50 mg total) by mouth 3 (three) times daily. 270 capsule 1  . PROCTOSOL HC 2.5 % rectal cream APPLY RECTALLY TWICE DAILY 28.35 g 3  . venlafaxine XR (EFFEXOR-XR) 75 MG 24 hr capsule Take 1 capsule (75 mg total) by mouth daily with breakfast. 90 capsule 0  . zolpidem (AMBIEN) 5 MG tablet Take 2 tablets (10 mg total) by mouth at bedtime as needed for sleep. 60 tablet 0   No current facility-administered medications for this visit.     Past Medical History  Diagnosis Date  . SPINAL STENOSIS   . LOW BACK PAIN     chronic, follows with Nsurg for ESI  . FRACTURE, PELVIS, RIGHT 12/2009  . HIP FRACTURE, LEFT 09/2009    s/p L THA  . ALLERGIC RHINITIS   . Carpal tunnel syndrome   . Hypertension   . HYPERLIPIDEMIA   . GERD   . Arthritis   . Osteoporosis   . Atrial fibrillation  no anticoag due to fall risk/age  . DEPRESSION   . CORONARY ARTERY DISEASE 2004    a.  LHC (01/2003):  3 v CAD => s/p CABG.;   b.  Myoview (02/2013):  Anterior scar; no ischemia; EF 49%.    . Systolic congestive heart failure   . 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 Echo (02/2013):  EF 25-30%.;   b.  Echo (04/04/13):  Mild focal basal septal hypertrophy, EF 60-65%, no RWMA, Gr 2 DD, mild to mod MAC, mild bileaflet MVP, mild to mod MR, mild to mod LAE, mild RAE, PASP 48.  [name] H/O Doppler ultrasound     Renal Artery [place] (09/2010):  No RAS.  . Carotid artery occlusion     Carotid US (05/2010):  RICA 70-99%; LICA 0-49%  . Anemia     Past Surgical History   Procedure Laterality Date  . (l) hip hemiarthroplasty  09/2009  . Appendectomy  1940  . Eye surgery      both cataracts  . Coronary artery bypass graft  2004  . Carpal tunnel release  2004    right and left-dsc  . Ulnar nerve transposition  2009    lt   . Vein ligation      legs  . Colonoscopy    . I&d extremity Right 12/26/2012    Procedure: IRRIGATION AND DEBRIDEMENT OF RIGHT LEG, SURGICAL PREP PLACEMENT OF A CELL AND VAC   ;  Surgeon: Wayland Denis, DO;  Location: [facility];  Service: Plastics;  Laterality: Right;    Social History   Social History  . Marital Status: Divorced    Spouse Name: N/A  . Number of Children: N/A  . Years of Education: N/A   Occupational History  . Not on file.   Social History Main Topics  . Smoking status: Never Smoker   . Smokeless tobacco: Not on file  . Alcohol Use: No  . Drug Use: No  . Sexual Activity: No   Other Topics Concern  . Not on file   Social History Narrative   ** Merged History Encounter **       ACP - HCPOA Margarette Asal (c) [phone]; No CPR,  No mechanical ventilation.     ROS: mildly productive cough but no fevers or chills, hemoptysis, dysphasia, odynophagia, melena, hematochezia, dysuria, hematuria, rash, seizure activity, orthopnea, PND, pedal edema, claudication. Remaining systems are negative.  Physical Exam: Well-developed well-nourished in no acute distress.  Skin is warm and dry.  HEENT is normal.  Neck is supple.  Chest diminished breath sounds throughout Cardiovascular exam is regular rate and rhythm.  Abdominal exam nontender or distended. No masses palpated. Extremities show trace edema. neuro grossly intact  ECG sinus rhythm with first-degree AV block. Normal axis. No ST changes.

## 2015-03-20 LAB — URINALYSIS W MICROSCOPIC + REFLEX CULTURE
Bacteria, UA: NONE SEEN [HPF]
Bilirubin Urine: NEGATIVE
Casts: NONE SEEN [LPF]
GLUCOSE, UA: NEGATIVE
Hgb urine dipstick: NEGATIVE
KETONES UR: NEGATIVE
NITRITE: NEGATIVE
RBC / HPF: NONE SEEN RBC/HPF (ref <=2)
SPECIFIC GRAVITY, URINE: 1.022 (ref 1.001–1.035)
Yeast: NONE SEEN [HPF]
pH: 5.5 (ref 5.0–8.0)

## 2015-03-22 ENCOUNTER — Ambulatory Visit (INDEPENDENT_AMBULATORY_CARE_PROVIDER_SITE_OTHER): Payer: Medicare Other | Admitting: Cardiology

## 2015-03-22 ENCOUNTER — Encounter: Payer: Self-pay | Admitting: Cardiology

## 2015-03-22 VITALS — BP 100/60 | HR 70 | Ht 62.0 in | Wt 107.4 lb

## 2015-03-22 DIAGNOSIS — I679 Cerebrovascular disease, unspecified: Secondary | ICD-10-CM

## 2015-03-22 DIAGNOSIS — I10 Essential (primary) hypertension: Secondary | ICD-10-CM | POA: Diagnosis not present

## 2015-03-22 DIAGNOSIS — I480 Paroxysmal atrial fibrillation: Secondary | ICD-10-CM

## 2015-03-22 DIAGNOSIS — I2510 Atherosclerotic heart disease of native coronary artery without angina pectoris: Secondary | ICD-10-CM

## 2015-03-22 DIAGNOSIS — R609 Edema, unspecified: Secondary | ICD-10-CM

## 2015-03-22 NOTE — Patient Instructions (Signed)
Your physician wants you to follow-up in: 6 MONTHS WITH DR CRENSHAW You will receive a reminder letter in the mail two months in advance. If you don't receive a letter, please call our office to schedule the follow-up appointment.  

## 2015-03-22 NOTE — Assessment & Plan Note (Signed)
Continue aspirin and statin. 

## 2015-03-22 NOTE — Assessment & Plan Note (Signed)
Blood pressure controlled. Continue present medications. 

## 2015-03-22 NOTE — Assessment & Plan Note (Signed)
Patient is not volume overloaded on examination. continue present dose of diuretics.

## 2015-03-22 NOTE — Assessment & Plan Note (Signed)
Not on Coumadin because of fall risk. Continue aspirin. Continue beta blocker. 

## 2015-03-22 NOTE — Assessment & Plan Note (Signed)
Continue aspirin and statin. Because of age we have elected not to pursue further carotid Dopplers.

## 2015-03-22 NOTE — Assessment & Plan Note (Signed)
Continue statin. 

## 2015-03-24 ENCOUNTER — Encounter: Payer: Self-pay | Admitting: Cardiology

## 2015-03-25 ENCOUNTER — Other Ambulatory Visit: Payer: Self-pay

## 2015-03-25 ENCOUNTER — Encounter: Payer: Self-pay | Admitting: Internal Medicine

## 2015-03-25 LAB — URINE CULTURE

## 2015-03-25 MED ORDER — CIPROFLOXACIN HCL 250 MG PO TABS: 250.0000 mg | ORAL_TABLET | Freq: Two times a day (BID) | ORAL | Status: DC

## 2015-03-29 ENCOUNTER — Telehealth: Payer: Self-pay | Admitting: Internal Medicine

## 2015-03-29 NOTE — Telephone Encounter (Signed)
Is there any advise you can give for the below.

## 2015-03-29 NOTE — Telephone Encounter (Signed)
Called pharmacy and they have delivered the rx on 03/25/15

## 2015-03-29 NOTE — Telephone Encounter (Signed)
Pt informed of MD advise below.

## 2015-03-29 NOTE — Telephone Encounter (Signed)
[facility] is calling to report that her BP is extremely low today for their visit. 96/48 Patient is more fatigued lately

## 2015-03-29 NOTE — Telephone Encounter (Signed)
Offer her some fluids with electrolytes

## 2015-04-08 ENCOUNTER — Telehealth: Payer: Self-pay | Admitting: Internal Medicine

## 2015-04-08 NOTE — Telephone Encounter (Signed)
Checking on a script refill that have sent over a few times for effexor xr

## 2015-04-09 MED ORDER — VENLAFAXINE HCL ER 75 MG PO CP24: 75.0000 mg | ORAL_CAPSULE | Freq: Every day | ORAL | Status: DC

## 2015-04-09 NOTE — Telephone Encounter (Signed)
Erx done.  

## 2015-04-19 ENCOUNTER — Telehealth: Payer: Self-pay | Admitting: Internal Medicine

## 2015-04-19 ENCOUNTER — Other Ambulatory Visit: Payer: Self-pay | Admitting: Internal Medicine

## 2015-04-19 MED ORDER — FENTANYL 50 MCG/HR TD PT72: 50.0000 ug | MEDICATED_PATCH | TRANSDERMAL | Status: DC

## 2015-04-19 MED ORDER — HYDROCODONE-ACETAMINOPHEN 5-325 MG PO TABS: 1.0000 | ORAL_TABLET | Freq: Four times a day (QID) | ORAL | Status: DC | PRN

## 2015-04-19 NOTE — Telephone Encounter (Signed)
Patient requesting refill for HYDROcodone-acetaminophen (NORCO/VICODIN) 5-325 MG per tablet [[id number] and fentaNYL (DURAGESIC) 50 MCG/HR [[id number]  Can you please give her a call when ready

## 2015-04-19 NOTE — Telephone Encounter (Signed)
Will only approve for 5 mg nightly. #30 no refills printed and signed. Can pickup with her other prescriptions.

## 2015-04-19 NOTE — Telephone Encounter (Signed)
Pt called regarding picking up the prescriptions. She wanted her daughter to come in to pick them up and I informed her she will need to come in to get them. Can you please call her when they are ready for pick up

## 2015-04-19 NOTE — Telephone Encounter (Signed)
Will fill but needs UDS as none in records and inconsistent fill record with the fentanyl (not filled since August). Very high risk with all her concurrent medications.

## 2015-04-26 ENCOUNTER — Other Ambulatory Visit: Payer: Self-pay | Admitting: Internal Medicine

## 2015-05-03 ENCOUNTER — Telehealth: Payer: Self-pay | Admitting: Internal Medicine

## 2015-05-03 NOTE — Telephone Encounter (Signed)
[place] [phone]  Home health nurse   Several bumps on her left cheek below her back.  They are not red, no brusing.  They are painful.  This a new situation, she has had them for a couple of weeks.    Does she needs to be seen?  They are unaware of what they are.

## 2015-05-04 NOTE — Telephone Encounter (Signed)
Left message for Home Health Nurse.  Also contacted patient and scheduled appointment with Dr. Lawerance BachBurns on 10/26

## 2015-05-05 ENCOUNTER — Encounter: Payer: Self-pay | Admitting: Internal Medicine

## 2015-05-05 ENCOUNTER — Telehealth: Payer: Self-pay | Admitting: Internal Medicine

## 2015-05-05 ENCOUNTER — Ambulatory Visit (INDEPENDENT_AMBULATORY_CARE_PROVIDER_SITE_OTHER): Payer: Medicare Other | Admitting: Internal Medicine

## 2015-05-05 VITALS — BP 110/62 | HR 78 | Temp 98.3°F | Resp 18 | Wt 109.0 lb

## 2015-05-05 DIAGNOSIS — R229 Localized swelling, mass and lump, unspecified: Secondary | ICD-10-CM | POA: Diagnosis not present

## 2015-05-05 DIAGNOSIS — I5030 Unspecified diastolic (congestive) heart failure: Secondary | ICD-10-CM | POA: Insufficient documentation

## 2015-05-05 DIAGNOSIS — I2510 Atherosclerotic heart disease of native coronary artery without angina pectoris: Secondary | ICD-10-CM | POA: Diagnosis not present

## 2015-05-05 DIAGNOSIS — I5031 Acute diastolic (congestive) heart failure: Secondary | ICD-10-CM | POA: Diagnosis not present

## 2015-05-05 DIAGNOSIS — R600 Localized edema: Secondary | ICD-10-CM

## 2015-05-05 DIAGNOSIS — R110 Nausea: Secondary | ICD-10-CM

## 2015-05-05 DIAGNOSIS — K5900 Constipation, unspecified: Secondary | ICD-10-CM

## 2015-05-05 MED ORDER — SENNA 8.6 MG PO TABS: 2.0000 | ORAL_TABLET | Freq: Every day | ORAL | Status: AC

## 2015-05-05 NOTE — Telephone Encounter (Signed)
erx done

## 2015-05-05 NOTE — Telephone Encounter (Signed)
pls advise at your convenience 

## 2015-05-05 NOTE — Assessment & Plan Note (Signed)
3 large lumps superficial and left buttock 1 has been present for years, the other 2 are more recent Discussed with her and her daughter-in-law doing an ultrasound versus monitoring For now we will just monitor given that there is no significant discomfort and she denies any rapid growth-if either of these occur and would recommend doing an ultrasound

## 2015-05-05 NOTE — Progress Notes (Signed)
Subjective:    Patient ID: Julia Black, female    DOB: 03/07/1919, 81 y.o.   MRN: [id number]  HPI She is here for an acute visit for bump in buttock, increased leg edema and nausea, constipation.  Bumps on buttock region:  One of hte bumps has been there for years and she thinks it may be a cyst.  The new bumps have been there for one month and are slightly sore.  Physical therapy recommended she have them evaluated.  Her daughter in law is with her today and has never heard her complain about this.   Swelling in left leg, increased weight;  she currently follows with cardiology and takes Lasix on a daily basis. She doesn't monitor her weight and has chronic leg swelling. On occasion she has had increased leg swelling and has needed to take an extra Lasix. 3 days ago she noticed increased swelling in her left leg. She also has gained weight. She took an extra Lasix yesterday afternoon and lost 3 pounds since yesterday. It did help the leg swelling slightly, but it is still more than usual. She has noticed some slight shortness of breath. She denies any new chest pain or palpitations.   Nausea in morning, constipation:  She wakes up nauseated.  This started months ago.  The nausea goes away after she eats breakfast.  She feels GERD on a rare occasion.  She has frequent bowel movements and her stool is small balls.  She goes 2-3 times after each meal.  This started about 6 months ago.  If she takes a laxative she may have diarrhea.   She is on chronic pain medication for severe osteoarthritis.   Medications and allergies reviewed with patient and updated if appropriate.  Patient Active Problem List   Diagnosis Date Noted  . Diarrhea 12/25/2014  . Dysuria 07/15/2014  . Hyperglycemia 06/10/2014  . NSTEMI (non-ST elevated myocardial infarction) (HCC)   . Cerebrovascular disease 12/11/2013  . Open wound of leg (except thigh), complicated 12/12/2012  . Knee osteoarthritis   . Macular degeneration  06/28/2011  . Atrial fibrillation (HCC) 09/27/2010  . LOW BACK PAIN 03/17/2010  . EDEMA 01/17/2010  . FRACTURE, PELVIS, RIGHT 01/03/2010  . HIP FRACTURE, LEFT 10/03/2009  . DEPRESSION 07/15/2009  . ALLERGIC RHINITIS 07/15/2009  . GERD 07/15/2009  . HYPERLIPIDEMIA 07/14/2009  . Essential hypertension 07/14/2009  . Coronary atherosclerosis 07/14/2009  . SPINAL STENOSIS 07/14/2009    Past Medical History  Diagnosis Date  . SPINAL STENOSIS   . LOW BACK PAIN     chronic, follows with Nsurg for ESI  . FRACTURE, PELVIS, RIGHT 12/2009  . HIP FRACTURE, LEFT 09/2009    s/p L THA  . ALLERGIC RHINITIS   . Carpal tunnel syndrome   . Hypertension   . HYPERLIPIDEMIA   . GERD   . Arthritis   . Osteoporosis   . Atrial fibrillation     no anticoag due to fall risk/age  . DEPRESSION   . CORONARY ARTERY DISEASE 2004    a.  LHC (01/2003):  3 v CAD => s/p CABG.;   b.  Myoview (02/2013):  Anterior scar; no ischemia; EF 49%.    . Systolic congestive heart failure   . 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 Echo (02/2013):  EF 25-30%.;   b.  Echo (04/04/13):  Mild focal basal septal hypertrophy, EF 60-65%, no RWMA, Gr 2 DD, mild to mod MAC, mild bileaflet  MVP, mild to mod MR, mild to mod LAE, mild RAE, PASP 48.  [name] H/O Doppler ultrasound     Renal Artery [place] (09/2010):  No RAS.  . Carotid artery occlusion     Carotid US (05/2010):  RICA 70-99%; LICA 0-49%  . Anemia     Past Surgical History  Procedure Laterality Date  . (l) hip hemiarthroplasty  09/2009  . Appendectomy  1940  . Eye surgery      both cataracts  . Coronary artery bypass graft  2004  . Carpal tunnel release  2004    right and left-dsc  . Ulnar nerve transposition  2009    lt   . Vein ligation      legs  . Colonoscopy    . I&d extremity Right 12/26/2012    Procedure: IRRIGATION AND DEBRIDEMENT OF RIGHT LEG, SURGICAL PREP PLACEMENT OF A CELL AND VAC   ;  Surgeon: Wayland Denis, DO;  Location: [facility];   Service: Plastics;  Laterality: Right;    Social History   Social History  . Marital Status: Divorced    Spouse Name: N/A  . Number of Children: N/A  . Years of Education: N/A   Social History Main Topics  . Smoking status: Never Smoker   . Smokeless tobacco: Not on file  . Alcohol Use: No  . Drug Use: No  . Sexual Activity: No   Other Topics Concern  . Not on file   Social History Narrative   ** Merged History Encounter **       ACP - HCPOA Margarette Asal (c) [phone]; No CPR,  No mechanical ventilation.     Review of Systems  Constitutional: Negative for fever.  HENT: Negative for congestion and sore throat.   Respiratory: Positive for shortness of breath (increased x few days). Negative for cough and wheezing.   Cardiovascular: Positive for leg swelling. Negative for chest pain and palpitations.  Gastrointestinal: Positive for abdominal pain (due to constipation) and constipation.       Rare GERD  Neurological: Positive for light-headedness and headaches (mild).       Objective:   Filed Vitals:   05/05/15 1435  BP: 110/62  Pulse: 78  Temp: 98.3 F (36.8 C)  Resp: 18   Filed Weights   05/05/15 1435  Weight: 109 lb (49.442 kg)   Body mass index is 19.93 kg/(m^2).   Physical Exam  Constitutional: She appears well-developed and well-nourished. No distress.  HENT:  Head: Normocephalic and atraumatic.  Eyes: Conjunctivae are normal.  Neck: Neck supple. No JVD present. No tracheal deviation present. No thyromegaly present.  Cardiovascular: Normal rate, regular rhythm and normal heart sounds.   No murmur heard. Pulmonary/Chest: Effort normal and breath sounds normal. No respiratory distress. She has no wheezes.  Abdominal: Soft. There is no tenderness.  Musculoskeletal: She exhibits edema (Bilateral lower extremity edema, left 2+-worse than right).  Skin: There is erythema (bilateral lower extremity edema,  left lower extremity worse than right, left leg  with a couple of scabbed lesions, no increased warmth or tenderness with palpation, no breaks in skin).  Left buttock with normal skin, three lump palpable - no-slight tenderness, largest is the size of a injury, but a long in shape, other lumps smaller  Psychiatric: She has a normal mood and affect. Her behavior is normal.        Assessment & Plan:    See Problem List.

## 2015-05-05 NOTE — Patient Instructions (Signed)
Increase your lasix to 40 mg daily for three days to help decrease the edema.  Have blood work done on Monday to check your kidney function.  We will call you with the results.  Start senna or senokot for your constipation.  Your stool softeners may need to be adjusted to help prevent the constipation.  The nausea is likely related to your medications.  Monitor the lumps in your buttock - if they become larger or more painful let us know and we will do an ultrasound.

## 2015-05-05 NOTE — Telephone Encounter (Signed)
LVM for pt to call back as soon as possible. \  RE: MD response below.  

## 2015-05-05 NOTE — Progress Notes (Signed)
Pre visit review using our clinic review tool, if applicable. No additional management support is needed unless otherwise documented below in the visit note. 

## 2015-05-05 NOTE — Assessment & Plan Note (Signed)
Increased edema, especially left leg. Her daughter-in-law states this is usual to have it in one leg more than the other Likely related to fluid retention, mild heart failure Will double Lasix dose to 40 mg daily for 3 days Check BMP on Monday

## 2015-05-05 NOTE — Assessment & Plan Note (Signed)
Likely related to her chronic pain medication Currently taking Colace 100 mg twice daily and increase to 200 mg once daily and 100 mg at night Start Senokot 2 tabs at bedtime If this does not improve the constipation may need to give her a mild laxative or consider other prescription medication for chronic constipation  Follow-up if no improvement

## 2015-05-05 NOTE — Assessment & Plan Note (Signed)
Chronic nausea when she wakes up in the morning-resolves once she eats something Likely related to medication, her constipation may also be contributing Advised her to eat first thing in the morning-at this age this seems like the easiest and least harmful remedy Follow-up if nausea worsens

## 2015-05-05 NOTE — Telephone Encounter (Signed)
She can try to take an additional 1/2 - 1 furosemide pill and if that does not help than I would recommend an OV.

## 2015-05-05 NOTE — Assessment & Plan Note (Signed)
Grade 1 diastolic dysfunction on last echocardiogram Current symptoms include increased shortness of breath, edema in legs and weight gain Increase Lasix to 40 mg in the morning for 3 days Check BMP on Monday If no improvement will continue increased Lasix dose and monitor kidney function

## 2015-05-05 NOTE — Telephone Encounter (Signed)
Left leg is swelling and pitting.  States patient noticed this about Sunday or Monday.  There is no pain and is not warm to the touch.

## 2015-05-10 ENCOUNTER — Other Ambulatory Visit (INDEPENDENT_AMBULATORY_CARE_PROVIDER_SITE_OTHER): Payer: Medicare Other

## 2015-05-10 DIAGNOSIS — R600 Localized edema: Secondary | ICD-10-CM | POA: Diagnosis not present

## 2015-05-10 LAB — BASIC METABOLIC PANEL
BUN: 21 mg/dL (ref 6–23)
CHLORIDE: 101 meq/L (ref 96–112)
CO2: 33 mEq/L — ABNORMAL HIGH (ref 19–32)
Calcium: 9.3 mg/dL (ref 8.4–10.5)
Creatinine, Ser: 0.61 mg/dL (ref 0.40–1.20)
GFR: 96.92 mL/min (ref >60.00)
Glucose, Bld: 72 mg/dL (ref 70–99)
POTASSIUM: 4.1 meq/L (ref 3.5–5.1)
Sodium: 141 mEq/L (ref 135–145)

## 2015-05-11 ENCOUNTER — Encounter: Payer: Self-pay | Admitting: Internal Medicine

## 2015-05-14 ENCOUNTER — Other Ambulatory Visit: Payer: Self-pay | Admitting: Emergency Medicine

## 2015-05-14 ENCOUNTER — Telehealth: Payer: Self-pay | Admitting: Internal Medicine

## 2015-05-14 ENCOUNTER — Encounter: Payer: Self-pay | Admitting: Internal Medicine

## 2015-05-14 DIAGNOSIS — I872 Venous insufficiency (chronic) (peripheral): Secondary | ICD-10-CM

## 2015-05-14 NOTE — Telephone Encounter (Signed)
[facility], faxed over RX for Compression stockings.

## 2015-05-14 NOTE — Telephone Encounter (Signed)
Patient is having increased swelling in right leg.  Can move fluid in leg.  Inquiring on compression stockings or what other options could be used to reduce swelling.

## 2015-05-14 NOTE — Telephone Encounter (Signed)
Please advise 

## 2015-05-14 NOTE — Telephone Encounter (Signed)
Yes I would recommend compression stockings in addition to elevating her legs when sitting. If that does not help enough we may need to consider increasing her lasix, but that may injury her kidneys so we would need to monitor that closely

## 2015-05-17 ENCOUNTER — Telehealth: Payer: Self-pay | Admitting: Cardiology

## 2015-05-17 NOTE — Telephone Encounter (Signed)
Daughter states pt has been having swelling noted in her left calf. She is not having any noticeable swelling elsewhere. She has no pain assoc w/ this. Pt did not have any change noted after temp increase of lasix. Currently, she sees physical therapy a couple times a week at [facility].   Daughter is aware they have suggested compression socks/stockings for her to wear. Notes these would have to fit loosely for her to be able to put on.  Wants to know if anything else suggested. I advised to pursue the stocking option under direction of physical therapist, & would see if Dr. Jens Somrenshaw recommended anything further.  Pt not having any symptoms w/ left calf swelling - no SOB, no other swelling, etc.

## 2015-05-17 NOTE — Telephone Encounter (Signed)
Ok for compression hose Julia MillersBrian Crenshaw

## 2015-05-17 NOTE — Telephone Encounter (Signed)
Pt is having retention of fluid in her left calf of her leg.This have been going on for a week just about 2 weeks.Her primary doctor had recommended doubling on her Laisix, She did this for 3 days,it did not help. Her Physical therapist is recommending compression hose.Please call to advise.

## 2015-05-18 ENCOUNTER — Telehealth: Payer: Self-pay | Admitting: Internal Medicine

## 2015-05-18 NOTE — Telephone Encounter (Signed)
Spoke with susan, Aware of dr Ludwig Clarkscrenshaw's recommendations.

## 2015-05-18 NOTE — Telephone Encounter (Signed)
Patient requesting refills for HYDROcodone-acetaminophen (NORCO/VICODIN) 5-325 MG tablet [[id number]][[id number]]  And fentaNYL (DURAGESIC) 50 MCG/HR [[id number] Needs today

## 2015-05-19 ENCOUNTER — Telehealth: Payer: Self-pay | Admitting: Internal Medicine

## 2015-05-19 MED ORDER — HYDROCODONE-ACETAMINOPHEN 5-325 MG PO TABS: 1.0000 | ORAL_TABLET | Freq: Four times a day (QID) | ORAL | Status: DC | PRN

## 2015-05-19 NOTE — Telephone Encounter (Signed)
rx printed for PCP.  

## 2015-05-20 ENCOUNTER — Other Ambulatory Visit: Payer: Self-pay

## 2015-05-20 MED ORDER — FENTANYL 50 MCG/HR TD PT72: 50.0000 ug | MEDICATED_PATCH | TRANSDERMAL | Status: DC

## 2015-05-24 ENCOUNTER — Other Ambulatory Visit: Payer: Self-pay | Admitting: Internal Medicine

## 2015-05-25 ENCOUNTER — Other Ambulatory Visit (INDEPENDENT_AMBULATORY_CARE_PROVIDER_SITE_OTHER): Payer: Medicare Other

## 2015-05-25 ENCOUNTER — Encounter: Payer: Self-pay | Admitting: Internal Medicine

## 2015-05-25 ENCOUNTER — Ambulatory Visit (INDEPENDENT_AMBULATORY_CARE_PROVIDER_SITE_OTHER): Payer: Medicare Other | Admitting: Internal Medicine

## 2015-05-25 VITALS — BP 124/72 | HR 72 | Temp 98.2°F | Resp 16 | Wt 111.0 lb

## 2015-05-25 DIAGNOSIS — I2510 Atherosclerotic heart disease of native coronary artery without angina pectoris: Secondary | ICD-10-CM

## 2015-05-25 DIAGNOSIS — R609 Edema, unspecified: Secondary | ICD-10-CM

## 2015-05-25 DIAGNOSIS — R600 Localized edema: Secondary | ICD-10-CM

## 2015-05-25 DIAGNOSIS — R195 Other fecal abnormalities: Secondary | ICD-10-CM | POA: Insufficient documentation

## 2015-05-25 DIAGNOSIS — M1990 Unspecified osteoarthritis, unspecified site: Secondary | ICD-10-CM | POA: Diagnosis not present

## 2015-05-25 DIAGNOSIS — R0602 Shortness of breath: Secondary | ICD-10-CM

## 2015-05-25 DIAGNOSIS — M549 Dorsalgia, unspecified: Secondary | ICD-10-CM | POA: Diagnosis not present

## 2015-05-25 DIAGNOSIS — R300 Dysuria: Secondary | ICD-10-CM | POA: Diagnosis not present

## 2015-05-25 DIAGNOSIS — R079 Chest pain, unspecified: Secondary | ICD-10-CM

## 2015-05-25 DIAGNOSIS — M4800 Spinal stenosis, site unspecified: Secondary | ICD-10-CM

## 2015-05-25 LAB — COMPREHENSIVE METABOLIC PANEL
ALK PHOS: 186 U/L — AB (ref 39–117)
ALT: 25 U/L (ref 0–35)
AST: 33 U/L (ref 0–37)
Albumin: 3.5 g/dL (ref 3.5–5.2)
BILIRUBIN TOTAL: 0.5 mg/dL (ref 0.2–1.2)
BUN: 20 mg/dL (ref 6–23)
CALCIUM: 9.7 mg/dL (ref 8.4–10.5)
CO2: 34 mEq/L — ABNORMAL HIGH (ref 19–32)
Chloride: 103 mEq/L (ref 96–112)
Creatinine, Ser: 0.57 mg/dL (ref 0.40–1.20)
GFR: 104.8 mL/min (ref >60.00)
GLUCOSE: 102 mg/dL — AB (ref 70–99)
POTASSIUM: 4.3 meq/L (ref 3.5–5.1)
Sodium: 141 mEq/L (ref 135–145)
TOTAL PROTEIN: 7 g/dL (ref 6.0–8.3)

## 2015-05-25 LAB — POCT URINALYSIS DIPSTICK
Bilirubin, UA: NEGATIVE
Blood, UA: NEGATIVE
Glucose, UA: NEGATIVE
KETONES UA: NEGATIVE
LEUKOCYTES UA: NEGATIVE
Nitrite, UA: NEGATIVE
PH UA: 7.5
PROTEIN UA: NEGATIVE
SPEC GRAV UA: 1.015
UROBILINOGEN UA: 0.2

## 2015-05-25 LAB — BASIC METABOLIC PANEL
BUN: 20 mg/dL (ref 6–23)
CHLORIDE: 103 meq/L (ref 96–112)
CO2: 34 mEq/L — ABNORMAL HIGH (ref 19–32)
Calcium: 9.7 mg/dL (ref 8.4–10.5)
Creatinine, Ser: 0.57 mg/dL (ref 0.40–1.20)
GFR: 104.8 mL/min (ref >60.00)
Glucose, Bld: 102 mg/dL — ABNORMAL HIGH (ref 70–99)
POTASSIUM: 4.3 meq/L (ref 3.5–5.1)
Sodium: 141 mEq/L (ref 135–145)

## 2015-05-25 LAB — BRAIN NATRIURETIC PEPTIDE: Pro B Natriuretic peptide (BNP): 215 pg/mL — ABNORMAL HIGH (ref 0.0–100.0)

## 2015-05-25 LAB — TROPONIN I: TNIDX: 0.02 ug/L (ref 0.00–0.06)

## 2015-05-25 MED ORDER — FUROSEMIDE 20 MG PO TABS: ORAL_TABLET | ORAL | Status: DC

## 2015-05-25 NOTE — Assessment & Plan Note (Signed)
Experiencing some dysuria and increased urinary frequency along with lower abdominal pain Urine sample collected-Will rule out UTI

## 2015-05-25 NOTE — Assessment & Plan Note (Signed)
Chronic lower extremity edema is much worse, especially in the left leg She has gained some weight, is experiencing shortness of breath and there is some concern for acute diastolic heart failure Check BNP Check CMP Increase Lasix to 40 mg daily Recheck BMP next Monday Recommended following up with her cardiologist sooner since her balance seems to be more of an issue and she is experiencing some chest pain

## 2015-05-25 NOTE — Patient Instructions (Signed)
Have blood work done today.   Increase the lasix to 40 mg daily in morning.  Recheck blood work on Monday.  Ultrasound of your leg ordered.  EKG today.  Call cardiology and schedule a follow up appointment.  Decreased stool softener medications   A referral was ordered pain management

## 2015-05-25 NOTE — Telephone Encounter (Signed)
rx faxed to [facility].

## 2015-05-25 NOTE — Assessment & Plan Note (Signed)
Left leg edema is usually worse than right, but today it is much worse  Increase lasix to 40 mg dialy US of leg to rule out dvt

## 2015-05-25 NOTE — Progress Notes (Signed)
Pre visit review using our clinic review tool, if applicable. No additional management support is needed unless otherwise documented below in the visit note. 

## 2015-05-25 NOTE — Assessment & Plan Note (Signed)
Chronic pain from back pain and arthritis Not controlled on current pain medicaitons Will refer to palliative care - her daughter also has some concerns about her ability to live alone - she is very independent

## 2015-05-25 NOTE — Assessment & Plan Note (Signed)
Several days of loose stools Her last visit we increased her stool softeners and she may be on too much-decrease and reevaluate stool consistency No recent antibiotics so I do not think C. difficile concern and will hold off on further evaluation to see how she responds to the decreased dose in stool softeners

## 2015-05-25 NOTE — Progress Notes (Signed)
Subjective:    Patient ID: Julia Black, female    DOB: [birth year], 81 y.o.   MRN: [id number]  HPI She is here today for an acute visit for left leg swelling.  She has chronic edema and takes lasix daily.   She will take an extra lasix if the leg swelling increases.  Periodically she needs to take an extra dose of Lasix. She also has a history of diastolic heart failure, atrial fibrillation and coronary artery disease. She follows with cardiology and last saw them a couple of months ago. Echo 01/2015: EF 55-60%.  She has been experiencing increased leg edema, the left leg is worse than the right and typically is worse than the right leg. Her left leg is more swollen now than at her has been in the past. She took an extra dose of Lasix 2 days ago because her weight was also increased. Her weight was approximately 6 pounds over her baseline. She was also experiencing some shortness of breath and she states some chest pain. She denies any results from the extra dose of Lasix. There has been no improvement in her symptoms. She is experiencing some pain in her left leg swelling, but denies any increased warmth. She denies any fevers or chills.   She states the chest pain is intermittent and located in different locations throughout her chest. She describes it as a burning sensation. She states it is relatively new and her family was not aware of this. They do not think that she discussed it with her cardiologist.  Chronic pain: She has chronic back pain and chronic arthritic pain. She currently has a fentanyl patch and is taking Vicodin daily. She states her pain is not controlled and she wonders what else can be done. Her daughter states she is in constant pain.  Her daughter worries about her being able to live home alone. She is concerned whether she is eating as much as she should. She thinks the pain might be inhibiting her from making her meals. She is very independent and wants to remain living  alone.     loose stools: She has loose stools 5 times a day in the morning and  often in evening.  We increased  the stool softeners at her last visit because of constipation.  She has some abdominal pain at times and this is often better after a bowel movement. She states some burning with urination and has had increased urination. She does have a history of urinary tract infections. She did bring a sample of urination today.   Medications and allergies reviewed with patient and updated if appropriate.  Patient Active Problem List   Diagnosis Date Noted  . Lump of skin 05/05/2015  . Constipation 05/05/2015  . Nausea without vomiting 05/05/2015  . Diastolic dysfunction with heart failure (HCC) 05/05/2015  . Diarrhea 12/25/2014  . Dysuria 07/15/2014  . Hyperglycemia 06/10/2014  . NSTEMI (non-ST elevated myocardial infarction) (HCC)   . Cerebrovascular disease 12/11/2013  . Open wound of leg (except thigh), complicated 12/12/2012  . Knee osteoarthritis   . Macular degeneration 06/28/2011  . Atrial fibrillation (HCC) 09/27/2010  . LOW BACK PAIN 03/17/2010  . EDEMA 01/17/2010  . FRACTURE, PELVIS, RIGHT 01/03/2010  . HIP FRACTURE, LEFT 10/03/2009  . DEPRESSION 07/15/2009  . ALLERGIC RHINITIS 07/15/2009  . GERD 07/15/2009  . HYPERLIPIDEMIA 07/14/2009  . Essential hypertension 07/14/2009  . Coronary atherosclerosis 07/14/2009  . SPINAL STENOSIS 07/14/2009    Current Outpatient  Prescriptions on File Prior to Visit  Medication Sig Dispense Refill  . ALPRAZolam (XANAX) 0.25 MG tablet Take 1 tablet (0.25 mg total) by mouth 2 (two) times daily as needed for anxiety. 30 tablet 5  . aspirin 81 MG tablet Take 81 mg by mouth at bedtime.     . Cholecalciferol (VITAMIN D PO) Take 500 Units by mouth daily.    [name] docusate sodium (DOK) 100 MG capsule Take 1 capsule (100 mg total) by mouth daily. (Patient taking differently: Take 100 mg by mouth daily. 2 Tablets in AM, 1 Tablet in PM) 60 capsule 11    . fentaNYL (DURAGESIC) 50 MCG/HR Place 1 patch (50 mcg total) onto the skin every 3 (three) days. 10 patch 0  . furosemide (LASIX) 20 MG tablet TAKE 20 MG BY MOUTH DAILY 30 tablet 5  . HYDROcodone-acetaminophen (NORCO/VICODIN) 5-325 MG tablet Take 1 tablet by mouth every 6 (six) hours as needed. (Patient taking differently: Take 2 tablets by mouth every 6 (six) hours as needed. ) 120 tablet 0  . LYRICA 50 MG capsule TAKE ONE CAPSULE THREE TIMES DAILY 270 capsule 0  . metoprolol tartrate (LOPRESSOR) 25 MG tablet Take 0.5 tablets (12.5 mg total) by mouth 2 (two) times daily. 30 tablet 6  . Multiple Vitamin (MULTIVITAMIN WITH MINERALS) TABS Take 1 tablet by mouth daily.    . nitroGLYCERIN (NITROSTAT) 0.4 MG SL tablet Place 1 tablet (0.4 mg total) under the tongue every 5 (five) minutes as needed for chest pain (upt o 3 doses). 25 tablet 4  . pantoprazole (PROTONIX) 40 MG tablet Take 1 tablet (40 mg total) by mouth daily. 30 tablet 6  . potassium chloride SA (K-DUR,KLOR-CON) 20 MEQ tablet Take 1 tablet (20 mEq total) by mouth daily. 90 tablet 3  . pravastatin (PRAVACHOL) 40 MG tablet Take 1 tablet (40 mg total) by mouth every evening. 90 tablet 1  . PROCTOSOL HC 2.5 % rectal cream APPLY RECTALLY TWICE DAILY 28.35 g 3  . senna (SENOKOT) 8.6 MG TABS tablet Take 2 tablets (17.2 mg total) by mouth at bedtime. 120 each 0  . venlafaxine XR (EFFEXOR-XR) 75 MG 24 hr capsule Take 1 capsule (75 mg total) by mouth daily with breakfast. 90 capsule 0  . vitamin C (ASCORBIC ACID) 500 MG tablet Take 500 mg by mouth daily.    [name] zolpidem (AMBIEN) 5 MG tablet TAKE ONE TABLET AT BEDTIME AS NEEDED FOR SLEEP 30 tablet 0   No current facility-administered medications on file prior to visit.    Past Medical History  Diagnosis Date  . SPINAL STENOSIS   . LOW BACK PAIN     chronic, follows with Nsurg for ESI  . FRACTURE, PELVIS, RIGHT 12/2009  . HIP FRACTURE, LEFT 09/2009    s/p L THA  . ALLERGIC RHINITIS   . Carpal  tunnel syndrome   . Hypertension   . HYPERLIPIDEMIA   . GERD   . Arthritis   . Osteoporosis   . Atrial fibrillation (HCC)     no anticoag due to fall risk/age  . DEPRESSION   . CORONARY ARTERY DISEASE 2004    a.  LHC (01/2003):  3 v CAD => s/p CABG.;   b.  Myoview (02/2013):  Anterior scar; no ischemia; EF 49%.    . Systolic congestive heart failure (HCC)   . 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 Echo (02/2013):  EF 25-30%.;   b.  Echo (04/04/13):  Mild focal basal septal hypertrophy, EF 60-65%, no RWMA, Gr 2 DD, mild to mod MAC, mild bileaflet MVP, mild to mod MR, mild to mod LAE, mild RAE, PASP 48.  [name] H/O Doppler ultrasound     Renal Artery [place] (09/2010):  No RAS.  . Carotid artery occlusion     Carotid US (05/2010):  RICA 70-99%; LICA 0-49%  . Anemia     Past Surgical History  Procedure Laterality Date  . (l) hip hemiarthroplasty  09/2009  . Appendectomy  1940  . Eye surgery      both cataracts  . Coronary artery bypass graft  2004  . Carpal tunnel release  2004    right and left-dsc  . Ulnar nerve transposition  2009    lt   . Vein ligation      legs  . Colonoscopy    . I&d extremity Right 12/26/2012    Procedure: IRRIGATION AND DEBRIDEMENT OF RIGHT LEG, SURGICAL PREP PLACEMENT OF A CELL AND VAC   ;  Surgeon: Wayland Denis, DO;  [facility];  Service: Plastics;  Laterality: Right;    Social History   Social History  . Marital Status: Divorced    Spouse Name: N/A  . Number of Children: N/A  . Years of Education: N/A   Social History Main Topics  . Smoking status: Never Smoker   . Smokeless tobacco: Not on file  . Alcohol Use: No  . Drug Use: No  . Sexual Activity: No   Other Topics Concern  . Not on file   Social History Narrative   ** Merged History Encounter **       ACP - HCPOA Margarette Asal (c) [phone]; No CPR,  No mechanical ventilation.     Review of Systems  Constitutional: Positive for fatigue. Negative for  fever and chills.  Respiratory: Positive for shortness of breath and wheezing. Negative for cough.   Cardiovascular: Positive for chest pain and leg swelling. Negative for palpitations.  Gastrointestinal: Positive for abdominal pain.       Frequent loose stools  Genitourinary: Positive for dysuria and frequency.  Musculoskeletal: Positive for back pain and arthralgias.  Skin: Positive for color change (lower legs appear darker than usual). Negative for rash.       Objective:   Filed Vitals:   05/25/15 1507  BP: 124/72  Pulse: 72  Temp: 98.2 F (36.8 C)  Resp: 16   Filed Weights   05/25/15 1507  Weight: 111 lb (50.349 kg)   Body mass index is 20.3 kg/(m^2).   Physical Exam  Constitutional: She is oriented to person, place, and time. She appears well-developed and well-nourished. No distress.  Neck: Neck supple. No JVD present. No tracheal deviation present. No thyromegaly present.  Cardiovascular: Normal rate and regular rhythm.   Murmur (2/6 systolic ) heard. Pulmonary/Chest: Effort normal and breath sounds normal. No respiratory distress. She has no wheezes. She has no rales.  Abdominal: Soft. She exhibits no distension. There is tenderness (minimal lower abd pain). There is no rebound and no guarding.  Musculoskeletal: She exhibits edema (left 2 + edema with pockets of fluids - no open wounds, right leg iwth 1+ edmea,  chornic mild erythema, no wamrth or tenderness).  Neurological: She is alert and oriented to person, place, and time.  Skin: Skin is warm and dry. There is erythema (lower legs, no warmth or pain, no calf pain).        Assessment &  Plan:   See Problem List.

## 2015-05-25 NOTE — Assessment & Plan Note (Signed)
Atypical sounding chest pain does not sound cardiac in nature EKG here today and change Check CMP, BNP Advised to follow-up with her cardiologist We'll increase Lasix for fluid overload

## 2015-05-26 ENCOUNTER — Other Ambulatory Visit: Payer: Self-pay | Admitting: Internal Medicine

## 2015-05-26 ENCOUNTER — Ambulatory Visit (HOSPITAL_COMMUNITY)
Admission: RE | Admit: 2015-05-26 | Discharge: 2015-05-26 | Disposition: A | Discharge status: Home / Self Care | Payer: Medicare Other | Source: Ambulatory Visit | Attending: Internal Medicine | Admitting: Internal Medicine

## 2015-05-26 ENCOUNTER — Telehealth: Payer: Self-pay | Admitting: Internal Medicine

## 2015-05-26 DIAGNOSIS — I10 Essential (primary) hypertension: Secondary | ICD-10-CM | POA: Insufficient documentation

## 2015-05-26 DIAGNOSIS — R0600 Dyspnea, unspecified: Secondary | ICD-10-CM | POA: Diagnosis not present

## 2015-05-26 DIAGNOSIS — R600 Localized edema: Secondary | ICD-10-CM

## 2015-05-26 DIAGNOSIS — E785 Hyperlipidemia, unspecified: Secondary | ICD-10-CM | POA: Insufficient documentation

## 2015-05-26 LAB — URINE CULTURE
Colony Count: NO GROWTH
Organism ID, Bacteria: NO GROWTH

## 2015-05-26 NOTE — Telephone Encounter (Signed)
[place], she stated that she thought it would be best if the patient came in the with daughter and you told them about the idea of Palliative care. She stated that the daughter does not see anything wrong with the pt and would not feel this is necessary. Please advise on when the pt should return to the office for a visit

## 2015-05-26 NOTE — Telephone Encounter (Signed)
Can schedule f/u next week for leg swelling and we can address then -- make it a 30 min appt.

## 2015-05-26 NOTE — Telephone Encounter (Signed)
States Dr. Lawerance BachBurns will have to bring up Palliative Care to patient.  States it needs to come from doctor and not family because patient is very independent.  Is requesting to stop palliative care.

## 2015-05-27 NOTE — Telephone Encounter (Signed)
Appt is scheduled for 11.23.2016

## 2015-05-31 ENCOUNTER — Other Ambulatory Visit (INDEPENDENT_AMBULATORY_CARE_PROVIDER_SITE_OTHER): Payer: Medicare Other

## 2015-05-31 DIAGNOSIS — R600 Localized edema: Secondary | ICD-10-CM | POA: Diagnosis not present

## 2015-05-31 LAB — BASIC METABOLIC PANEL
BUN: 19 mg/dL (ref 6–23)
CHLORIDE: 101 meq/L (ref 96–112)
CO2: 32 mEq/L (ref 19–32)
Calcium: 9.5 mg/dL (ref 8.4–10.5)
Creatinine, Ser: 0.63 mg/dL (ref 0.40–1.20)
GFR: 93.37 mL/min (ref >60.00)
Glucose, Bld: 110 mg/dL — ABNORMAL HIGH (ref 70–99)
POTASSIUM: 4.7 meq/L (ref 3.5–5.1)
Sodium: 139 mEq/L (ref 135–145)

## 2015-06-02 ENCOUNTER — Encounter: Payer: Self-pay | Admitting: Internal Medicine

## 2015-06-02 NOTE — Progress Notes (Signed)
Subjective:    Patient ID: Julia Black, female    DOB: 10/11/1918, 81 y.o.   MRN: [id number]  HPI error   Medications and allergies reviewed with patient and updated if appropriate.  Patient Active Problem List   Diagnosis Date Noted  . Loose stools 05/25/2015  . Pain in the chest 05/25/2015  . Leg edema, left 05/25/2015  . Lump of skin 05/05/2015  . Constipation 05/05/2015  . Nausea without vomiting 05/05/2015  . Diastolic dysfunction with heart failure (HCC) 05/05/2015  . Dysuria 07/15/2014  . Hyperglycemia 06/10/2014  . NSTEMI (non-ST elevated myocardial infarction) (HCC)   . Cerebrovascular disease 12/11/2013  . Knee osteoarthritis   . Macular degeneration 06/28/2011  . Atrial fibrillation (HCC) 09/27/2010  . LOW BACK PAIN 03/17/2010  . EDEMA 01/17/2010  . FRACTURE, PELVIS, RIGHT 01/03/2010  . HIP FRACTURE, LEFT 10/03/2009  . DEPRESSION 07/15/2009  . ALLERGIC RHINITIS 07/15/2009  . GERD 07/15/2009  . HYPERLIPIDEMIA 07/14/2009  . Essential hypertension 07/14/2009  . Coronary atherosclerosis 07/14/2009  . Spinal stenosis 07/14/2009    Current Outpatient Prescriptions on File Prior to Visit  Medication Sig Dispense Refill  . ALPRAZolam (XANAX) 0.25 MG tablet Take 1 tablet (0.25 mg total) by mouth 2 (two) times daily as needed for anxiety. 30 tablet 5  . aspirin 81 MG tablet Take 81 mg by mouth at bedtime.     . Cholecalciferol (VITAMIN D PO) Take 500 Units by mouth daily.    [name] docusate sodium (DOK) 100 MG capsule Take 1 capsule (100 mg total) by mouth daily. (Patient taking differently: Take 100 mg by mouth daily. 2 Tablets in AM, 1 Tablet in PM) 60 capsule 11  . fentaNYL (DURAGESIC) 50 MCG/HR Place 1 patch (50 mcg total) onto the skin every 3 (three) days. 10 patch 0  . furosemide (LASIX) 20 MG tablet TAKE 40 MG BY MOUTH DAILY 60 tablet 5  . HYDROcodone-acetaminophen (NORCO/VICODIN) 5-325 MG tablet Take 1 tablet by mouth every 6 (six) hours as needed. (Patient  taking differently: Take 2 tablets by mouth every 6 (six) hours as needed. ) 120 tablet 0  . LYRICA 50 MG capsule TAKE ONE CAPSULE THREE TIMES DAILY 270 capsule 0  . metoprolol tartrate (LOPRESSOR) 25 MG tablet Take 0.5 tablets (12.5 mg total) by mouth 2 (two) times daily. 30 tablet 6  . Multiple Vitamin (MULTIVITAMIN WITH MINERALS) TABS Take 1 tablet by mouth daily.    . nitroGLYCERIN (NITROSTAT) 0.4 MG SL tablet Place 1 tablet (0.4 mg total) under the tongue every 5 (five) minutes as needed for chest pain (upt o 3 doses). 25 tablet 4  . pantoprazole (PROTONIX) 40 MG tablet Take 1 tablet (40 mg total) by mouth daily. 30 tablet 6  . potassium chloride SA (K-DUR,KLOR-CON) 20 MEQ tablet Take 1 tablet (20 mEq total) by mouth daily. 90 tablet 3  . pravastatin (PRAVACHOL) 40 MG tablet Take 1 tablet (40 mg total) by mouth every evening. 90 tablet 1  . PROCTOSOL HC 2.5 % rectal cream APPLY RECTALLY TWICE DAILY 28.35 g 3  . senna (SENOKOT) 8.6 MG TABS tablet Take 2 tablets (17.2 mg total) by mouth at bedtime. 120 each 0  . venlafaxine XR (EFFEXOR-XR) 75 MG 24 hr capsule Take 1 capsule (75 mg total) by mouth daily with breakfast. 90 capsule 0  . vitamin C (ASCORBIC ACID) 500 MG tablet Take 500 mg by mouth daily.    [name] zolpidem (AMBIEN) 5 MG tablet TAKE ONE  TABLET AT BEDTIME AS NEEDED FOR SLEEP 30 tablet 0   No current facility-administered medications on file prior to visit.    Past Medical History  Diagnosis Date  . SPINAL STENOSIS   . LOW BACK PAIN     chronic, follows with Nsurg for ESI  . FRACTURE, PELVIS, RIGHT 12/2009  . HIP FRACTURE, LEFT 09/2009    s/p L THA  . ALLERGIC RHINITIS   . Carpal tunnel syndrome   . Hypertension   . HYPERLIPIDEMIA   . GERD   . Arthritis   . Osteoporosis   . Atrial fibrillation (HCC)     no anticoag due to fall risk/age  . DEPRESSION   . CORONARY ARTERY DISEASE 2004    a.  LHC (01/2003):  3 v CAD => s/p CABG.;   b.  Myoview (02/2013):  Anterior scar; no  ischemia; EF 49%.    . Systolic congestive heart failure (HCC)   . 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 Echo (02/2013):  EF 25-30%.;   b.  Echo (04/04/13):  Mild focal basal septal hypertrophy, EF 60-65%, no RWMA, Gr 2 DD, mild to mod MAC, mild bileaflet MVP, mild to mod MR, mild to mod LAE, mild RAE, PASP 48.  [name]. H/O Doppler ultrasound     Renal Artery US (09/2010):  No RAS.  . Carotid artery occlusion     Carotid US (05/2010):  RICA 70-99%; LICA 0-49%  . Anemia     Past Surgical History  Procedure Laterality Date  . (l) hip hemiarthroplasty  09/2009  . Appendectomy  1940  . Eye surgery      both cataracts  . Coronary artery bypass graft  2004  . Carpal tunnel release  2004    right and left-dsc  . Ulnar nerve transposition  2009    lt   . Vein ligation      legs  . Colonoscopy    . I&d extremity Right 12/26/2012    Procedure: IRRIGATION AND DEBRIDEMENT OF RIGHT LEG, SURGICAL PREP PLACEMENT OF A CELL AND VAC   ;  Surgeon: Wayland Denislaire Sanger, DO;  Location: [facility];  Service: Plastics;  Laterality: Right;    Social History   Social History  . Marital Status: Divorced    Spouse Name: N/A  . Number of Children: N/A  . Years of Education: N/A   Social History Main Topics  . Smoking status: Never Smoker   . Smokeless tobacco: Not on file  . Alcohol Use: No  . Drug Use: No  . Sexual Activity: No   Other Topics Concern  . Not on file   Social History Narrative   ** Merged History Encounter **       ACP - HCPOA Margarette AsalSusan Black (c) [phone]; No CPR,  No mechanical ventilation.     Review of Systems     Objective:  There were no vitals filed for this visit. There were no vitals filed for this visit. There is no weight on file to calculate BMI.   Physical Exam        Assessment & Plan:    This encounter was created in error - please disregard.

## 2015-06-08 ENCOUNTER — Encounter: Payer: Self-pay | Admitting: Physician Assistant

## 2015-06-08 ENCOUNTER — Ambulatory Visit (INDEPENDENT_AMBULATORY_CARE_PROVIDER_SITE_OTHER): Payer: Medicare Other | Admitting: Physician Assistant

## 2015-06-08 VITALS — BP 124/72 | HR 70 | Ht 63.0 in | Wt 107.5 lb

## 2015-06-08 DIAGNOSIS — I5042 Chronic combined systolic (congestive) and diastolic (congestive) heart failure: Secondary | ICD-10-CM

## 2015-06-08 DIAGNOSIS — I2510 Atherosclerotic heart disease of native coronary artery without angina pectoris: Secondary | ICD-10-CM

## 2015-06-08 NOTE — Patient Instructions (Signed)
Weigh daily Call [phone] if weight climbs more than 3 pounds in a day or 5 pounds in a week. No salt to very little salt in your diet.  No more than 2000 mg in a day. Call if increased shortness of breath or increased swelling.   INCREASE ACTIVITY AS TOLERATED.  YOU MAY TAKE A EXTRA FUROSEMIDE AS NEEDED FOR SWELLING.  Your physician recommends that you schedule a follow-up appointment in: January with Dr. Jens Somrenshaw.

## 2015-06-08 NOTE — Progress Notes (Signed)
Cardiology Office Note   Date:  06/08/2015   ID:  Julia Black, DOB 1919-03-30, MRN [id number]  PCP:  Pincus Sanes, MD  Cardiologist:  Dr Rosemarie Beath, PA-C   Chief Complaint  Patient presents with  . Follow-up    pt daughter states for the past couple days she has felt horrible, every now and then when she first gets up  . Chest Pain    pt states not really pain but maybe a sensation   . Shortness of Breath    only when walking just for a few minutes  . Edema    left leg has a lot of edema with a little pain     History of Present Illness: Julia Black is a 81 y.o. female with a history of CABG in 2004, S-CHF w/ EF 30% 2014>60% 2016, MV w/ EF 49% and no ischemia, carotid stenosis, HTN, HL, atrial fibrillation not anticoagulated due to fall risk/age.   Julia Black presents for evaluation of several issues.  1. Chest pain: She has occasional episodes of chest pain. They are not exertional and did not last very long. They're not severe. She has not tried any medication for them.  2. Dyspnea on exertion: She has had some lower extremity edema, that has improved with Lasix. She is compliant with the medication. She has been much better about keeping her legs elevated when she sits. She does not walk very much. She does not add sodium to her food, but routinely eats things such as soup for many cheese for lunch and has a dinner prepared at [place] where she lives. Her dry weight is believed to be 105 and her current weight is 107, but she has eaten and is wearing heavier clothes. Her legs have had edema and there is still trace edema left. However, it is greatly improved by the Lasix that she has been taking. She has had a BMET checked since being started on the Lasix and it was reportedly okay.  3. Generalized weakness: She walks with a walker. She has not been weighing herself because she is unsteady on her feet and has trouble when she first gets out of bed. However,  she now has an aide and the aide will help her every morning. The family states they can instruct the aide to weigh her first thing every morning.   Past Medical History  Diagnosis Date  . SPINAL STENOSIS   . LOW BACK PAIN     chronic, follows with Nsurg for ESI  . FRACTURE, PELVIS, RIGHT 12/2009  . HIP FRACTURE, LEFT 09/2009    s/p L THA  . ALLERGIC RHINITIS   . Carpal tunnel syndrome   . Hypertension   . HYPERLIPIDEMIA   . GERD   . Arthritis   . Osteoporosis   . Atrial fibrillation (HCC)     no anticoag due to fall risk/age  . DEPRESSION   . CORONARY ARTERY DISEASE 2004    a.  LHC (01/2003):  3 v CAD => s/p CABG.;   b.  Myoview (02/2013):  Anterior scar; no ischemia; EF 49%.    . Systolic congestive heart failure (HCC)   . 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 Echo (02/2013):  EF 25-30%.;   b.  Echo (04/04/13):  Mild focal basal septal hypertrophy, EF 60-65%, no RWMA, Gr 2 DD, mild to mod MAC, mild bileaflet MVP, mild to mod MR, mild  to mod LAE, mild RAE, PASP 48.  [name] H/O Doppler ultrasound     Renal Artery [place] (09/2010):  No RAS.  . Carotid artery occlusion     Carotid US (05/2010):  RICA 70-99%; LICA 0-49%  . Anemia     Past Surgical History  Procedure Laterality Date  . (l) hip hemiarthroplasty  09/2009  . Appendectomy  1940  . Eye surgery      both cataracts  . Coronary artery bypass graft  2004  . Carpal tunnel release  2004    right and left-dsc  . Ulnar nerve transposition  2009    lt   . Vein ligation      legs  . Colonoscopy    . I&d extremity Right 12/26/2012    Procedure: IRRIGATION AND DEBRIDEMENT OF RIGHT LEG, SURGICAL PREP PLACEMENT OF A CELL AND VAC   ;  Surgeon: Wayland Denis, DO;  Location: [facility];  Service: Plastics;  Laterality: Right;    Current Outpatient Prescriptions  Medication Sig Dispense Refill  . ALPRAZolam (XANAX) 0.25 MG tablet Take 1 tablet (0.25 mg total) by mouth 2 (two) times daily as needed for anxiety.  30 tablet 5  . aspirin 81 MG tablet Take 81 mg by mouth at bedtime.     . Cholecalciferol (VITAMIN D PO) Take 500 Units by mouth daily.    [name] docusate sodium (DOK) 100 MG capsule Take 1 capsule (100 mg total) by mouth daily. (Patient taking differently: Take 100 mg by mouth daily. 2 Tablets in AM, 1 Tablet in PM) 60 capsule 11  . fentaNYL (DURAGESIC) 50 MCG/HR Place 1 patch (50 mcg total) onto the skin every 3 (three) days. 10 patch 0  . furosemide (LASIX) 20 MG tablet TAKE 40 MG BY MOUTH DAILY 60 tablet 5  . HYDROcodone-acetaminophen (NORCO/VICODIN) 5-325 MG tablet Take 1 tablet by mouth every 6 (six) hours as needed. (Patient taking differently: Take 2 tablets by mouth every 6 (six) hours as needed. ) 120 tablet 0  . LYRICA 50 MG capsule TAKE ONE CAPSULE THREE TIMES DAILY 270 capsule 0  . metoprolol tartrate (LOPRESSOR) 25 MG tablet Take 0.5 tablets (12.5 mg total) by mouth 2 (two) times daily. 30 tablet 6  . Multiple Vitamin (MULTIVITAMIN WITH MINERALS) TABS Take 1 tablet by mouth daily.    . nitroGLYCERIN (NITROSTAT) 0.4 MG SL tablet Place 1 tablet (0.4 mg total) under the tongue every 5 (five) minutes as needed for chest pain (upt o 3 doses). 25 tablet 4  . pantoprazole (PROTONIX) 40 MG tablet Take 1 tablet (40 mg total) by mouth daily. 30 tablet 6  . potassium chloride SA (K-DUR,KLOR-CON) 20 MEQ tablet Take 1 tablet (20 mEq total) by mouth daily. 90 tablet 3  . pravastatin (PRAVACHOL) 40 MG tablet Take 1 tablet (40 mg total) by mouth every evening. 90 tablet 1  . PROCTOSOL HC 2.5 % rectal cream APPLY RECTALLY TWICE DAILY 28.35 g 3  . senna (SENOKOT) 8.6 MG TABS tablet Take 2 tablets (17.2 mg total) by mouth at bedtime. 120 each 0  . venlafaxine XR (EFFEXOR-XR) 75 MG 24 hr capsule Take 1 capsule (75 mg total) by mouth daily with breakfast. 90 capsule 0  . vitamin C (ASCORBIC ACID) 500 MG tablet Take 500 mg by mouth daily.    [name] zolpidem (AMBIEN) 5 MG tablet TAKE ONE TABLET AT BEDTIME AS NEEDED  FOR SLEEP 30 tablet 0   No current facility-administered medications for this  visit.    Allergies:   Amoxicillin; Sporanox; and Sulfa antibiotics    Social History:  The patient  reports that she has never smoked. She does not have any smokeless tobacco history on file. She reports that she does not drink alcohol or use illicit drugs.   Family History:  The patient's family history includes Arthritis in her father, maternal grandfather, maternal grandmother, mother, paternal grandfather, and paternal grandmother; Breast cancer in her daughter and mother; Cancer (age of onset: 2682) in her mother; Emphysema (age of onset: 7178) in her father; Heart attack (age of onset: 7865) in her brother; Heart disease in her father and mother; Hypertension in her father and mother.    ROS:  Please see the history of present illness. All other systems are reviewed and negative.   PHYSICAL EXAM: VS:  BP 124/72 mmHg  Pulse 70  Ht 5\' 3"  (1.6 m)  Wt 107 lb 8 oz (48.762 kg)  BMI 19.05 kg/m2 , BMI Body mass index is 19.05 kg/(m^2). GEN: Well nourished, well developed, female in no acute distress HEENT: normal for age  Neck: JVD at 8 cm, bilateral carotid bruits that are likely radiation of her murmur, no masses; mildly positive hepatojugular reflux Cardiac: RRR; 2/6 murmur, no rubs, or gallops Respiratory: Rales bases bilaterally, normal work of breathing GI: soft, nontender, nondistended, + BS MS: no deformity or atrophy; no edema; distal pulses are 2+ in all 4 extremities  Skin: warm and dry, no rash Neuro:  Strength and sensation are intact Psych: euthymic mood, full affect   EKG:  EKG is not ordered today.  Recent Labs: 06/12/2014: Hemoglobin 13.4; Platelets 146* 05/25/2015: ALT 25; Pro B Natriuretic peptide (BNP) 215.0* 05/31/2015: BUN 19; Creatinine, Ser 0.63; Potassium 4.7; Sodium 139    Lipid Panel    Component Value Date/Time   CHOL 131 01/08/2014 1008   TRIG 95.0 01/08/2014 1008   TRIG 78  08/11/2010   HDL 55.80 01/08/2014 1008   CHOLHDL 2 01/08/2014 1008   VLDL 19.0 01/08/2014 1008   LDLCALC 56 01/08/2014 1008     Wt Readings from Last 3 Encounters:  06/08/15 107 lb 8 oz (48.762 kg)  05/25/15 111 lb (50.349 kg)  05/05/15 109 lb (49.442 kg)     Other studies Reviewed: Additional studies/ records that were reviewed today include: Previous office visits and labs.  ASSESSMENT AND PLAN:  1.  Chronic diastolic and systolic CHF: She has not been weighing herself recently. Her volume status was improved after taking the Lasix prescribed by her family physician. She occasionally feels lightheaded and her blood pressure is well controlled today. She has some dyspnea on exertion but she also has significant deconditioning because her activity level has been poor for a long time.  For now, no medication changes. She is to start tracking her weight. If her weight starts trending up, she can increase her Lasix temporarily. Her kidney function and electrolytes have tolerated diuresis well. She understands the need to limit her salt and is also encouraged to get protein with every meal.  2. PAF: No palpitations and her pulse rate is regular today. Her last ECG was 2 weeks ago and she was in sinus rhythm at that time.  Current medicines are reviewed at length with the patient today.  The patient does not have concerns regarding medicines.  The following changes have been made:  no change  Labs/ tests ordered today include:  No orders of the defined types were placed in  this encounter.   Disposition:   FU with Dr Jens Som  Signed, Leanna Battles  06/08/2015 4:06 PM    [facility] [address] [place], [place], [place]  [address] Phone: [phone]; Fax: [phone]

## 2015-06-09 ENCOUNTER — Ambulatory Visit (INDEPENDENT_AMBULATORY_CARE_PROVIDER_SITE_OTHER): Payer: Medicare Other | Admitting: Internal Medicine

## 2015-06-09 ENCOUNTER — Encounter: Payer: Self-pay | Admitting: Internal Medicine

## 2015-06-09 VITALS — BP 110/72 | HR 114 | Temp 98.2°F | Resp 20 | Ht 63.0 in | Wt 110.5 lb

## 2015-06-09 DIAGNOSIS — R609 Edema, unspecified: Secondary | ICD-10-CM

## 2015-06-09 DIAGNOSIS — K5909 Other constipation: Secondary | ICD-10-CM | POA: Diagnosis not present

## 2015-06-09 DIAGNOSIS — I2510 Atherosclerotic heart disease of native coronary artery without angina pectoris: Secondary | ICD-10-CM

## 2015-06-09 MED ORDER — DOCUSATE SODIUM 100 MG PO CAPS
100.0000 mg | ORAL_CAPSULE | Freq: Every day | ORAL | Status: AC
Start: 2015-06-09 — End: ?

## 2015-06-09 NOTE — Progress Notes (Signed)
Subjective:    Patient ID: Julia Black, female    DOB: Mar 24, 1919, 81 y.o.   MRN: [id number]  HPI  She is here for follow up of leg edema and her chronic back pain.   She now has help that will come in first thing in the morning to help her get breakfast and will be able to help her track her weight.     Leg edema:  She is taking the lasix daily.  When I saw her last week her leg edema was worse and she was having some sob and chest pain.  We increased her lasix to 40 mg daily (was taking 20 mg daily) and I asked her to see her cardiologist.  Blood work showed a BNP of only 215.  Her kidney function was normal and a repeat BMP was normal a couple of days ago. She saw cardiology yesterday and they advised her to continue the 40 mg of Lasix daily. They advised to increase only if needed and for a short time period only. She was under the assumption she was supposed to return to the 20 mg daily and this morning only took one pill (20 mg). This morning she had no edema and then recently she noticed increased edema in both legs. She states some mild discomfort. She states occasional shortness of breath and occasional chest pain, both of which are chronic and not new. She denies any fevers.  Physical therapy order to her loose fitting compression stockings and she was wondering if she can start using them.   Constipation: When I saw her last she was having some constipation. We adjusted her stool softener/laxative since she injured having loose stools. We just (now she states she is having constipation again. There is no change in diet or other change in medications. She states some mild stomach discomfort and she is constipated. I'll   Medications and allergies reviewed with patient and updated if appropriate.  Patient Active Problem List   Diagnosis Date Noted  . Loose stools 05/25/2015  . Pain in the chest 05/25/2015  . Leg edema, left 05/25/2015  . Lump of skin 05/05/2015  . Constipation  05/05/2015  . Nausea without vomiting 05/05/2015  . Diastolic dysfunction with heart failure (HCC) 05/05/2015  . Dysuria 07/15/2014  . Hyperglycemia 06/10/2014  . NSTEMI (non-ST elevated myocardial infarction) (HCC)   . Cerebrovascular disease 12/11/2013  . Knee osteoarthritis   . Macular degeneration 06/28/2011  . Atrial fibrillation (HCC) 09/27/2010  . LOW BACK PAIN 03/17/2010  . EDEMA 01/17/2010  . FRACTURE, PELVIS, RIGHT 01/03/2010  . HIP FRACTURE, LEFT 10/03/2009  . DEPRESSION 07/15/2009  . ALLERGIC RHINITIS 07/15/2009  . GERD 07/15/2009  . HYPERLIPIDEMIA 07/14/2009  . Essential hypertension 07/14/2009  . Coronary atherosclerosis 07/14/2009  . Spinal stenosis 07/14/2009    Current Outpatient Prescriptions on File Prior to Visit  Medication Sig Dispense Refill  . ALPRAZolam (XANAX) 0.25 MG tablet Take 1 tablet (0.25 mg total) by mouth 2 (two) times daily as needed for anxiety. 30 tablet 5  . aspirin 81 MG tablet Take 81 mg by mouth at bedtime.     . Cholecalciferol (VITAMIN D PO) Take 500 Units by mouth daily.    [name] docusate sodium (DOK) 100 MG capsule Take 1 capsule (100 mg total) by mouth daily. (Patient taking differently: Take 100 mg by mouth daily. 2 Tablets in AM, 1 Tablet in PM) 60 capsule 11  . fentaNYL (DURAGESIC) 50 MCG/HR Place 1  patch (50 mcg total) onto the skin every 3 (three) days. 10 patch 0  . furosemide (LASIX) 20 MG tablet TAKE 40 MG BY MOUTH DAILY 60 tablet 5  . HYDROcodone-acetaminophen (NORCO/VICODIN) 5-325 MG tablet Take 1 tablet by mouth every 6 (six) hours as needed. (Patient taking differently: Take 2 tablets by mouth every 6 (six) hours as needed. ) 120 tablet 0  . LYRICA 50 MG capsule TAKE ONE CAPSULE THREE TIMES DAILY 270 capsule 0  . metoprolol tartrate (LOPRESSOR) 25 MG tablet Take 0.5 tablets (12.5 mg total) by mouth 2 (two) times daily. 30 tablet 6  . Multiple Vitamin (MULTIVITAMIN WITH MINERALS) TABS Take 1 tablet by mouth daily.    .  nitroGLYCERIN (NITROSTAT) 0.4 MG SL tablet Place 1 tablet (0.4 mg total) under the tongue every 5 (five) minutes as needed for chest pain (upt o 3 doses). 25 tablet 4  . pantoprazole (PROTONIX) 40 MG tablet Take 1 tablet (40 mg total) by mouth daily. 30 tablet 6  . potassium chloride SA (K-DUR,KLOR-CON) 20 MEQ tablet Take 1 tablet (20 mEq total) by mouth daily. 90 tablet 3  . pravastatin (PRAVACHOL) 40 MG tablet Take 1 tablet (40 mg total) by mouth every evening. 90 tablet 1  . PROCTOSOL HC 2.5 % rectal cream APPLY RECTALLY TWICE DAILY 28.35 g 3  . senna (SENOKOT) 8.6 MG TABS tablet Take 2 tablets (17.2 mg total) by mouth at bedtime. 120 each 0  . venlafaxine XR (EFFEXOR-XR) 75 MG 24 hr capsule Take 1 capsule (75 mg total) by mouth daily with breakfast. 90 capsule 0  . vitamin C (ASCORBIC ACID) 500 MG tablet Take 500 mg by mouth daily.    [name] zolpidem (AMBIEN) 5 MG tablet TAKE ONE TABLET AT BEDTIME AS NEEDED FOR SLEEP 30 tablet 0   No current facility-administered medications on file prior to visit.    Past Medical History  Diagnosis Date  . SPINAL STENOSIS   . LOW BACK PAIN     chronic, follows with Nsurg for ESI  . FRACTURE, PELVIS, RIGHT 12/2009  . HIP FRACTURE, LEFT 09/2009    s/p L THA  . ALLERGIC RHINITIS   . Carpal tunnel syndrome   . Hypertension   . HYPERLIPIDEMIA   . GERD   . Arthritis   . Osteoporosis   . Atrial fibrillation (HCC)     no anticoag due to fall risk/age  . DEPRESSION   . CORONARY ARTERY DISEASE 2004    a.  LHC (01/2003):  3 v CAD => s/p CABG.;   b.  Myoview (02/2013):  Anterior scar; no ischemia; EF 49%.    . Systolic congestive heart failure (HCC)   . 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 Echo (02/2013):  EF 25-30%.;   b.  Echo (04/04/13):  Mild focal basal septal hypertrophy, EF 60-65%, no RWMA, Gr 2 DD, mild to mod MAC, mild bileaflet MVP, mild to mod MR, mild to mod LAE, mild RAE, PASP 48.  [name] H/O Doppler ultrasound     Renal Artery [place] (09/2010):   No RAS.  . Carotid artery occlusion     Carotid US (05/2010):  RICA 70-99%; LICA 0-49%  . Anemia     Past Surgical History  Procedure Laterality Date  . (l) hip hemiarthroplasty  09/2009  . Appendectomy  1940  . Eye surgery      both cataracts  . Coronary artery bypass graft  2004  . Carpal tunnel  release  2004    right and left-dsc  . Ulnar nerve transposition  2009    lt   . Vein ligation      legs  . Colonoscopy    . I&d extremity Right 12/26/2012    Procedure: IRRIGATION AND DEBRIDEMENT OF RIGHT LEG, SURGICAL PREP PLACEMENT OF A CELL AND VAC   ;  Surgeon: Wayland Denislaire Sanger, DO;  Location: [facility];  Service: Plastics;  Laterality: Right;    Social History   Social History  . Marital Status: Divorced    Spouse Name: N/A  . Number of Children: N/A  . Years of Education: N/A   Social History Main Topics  . Smoking status: Never Smoker   . Smokeless tobacco: None  . Alcohol Use: No  . Drug Use: No  . Sexual Activity: No   Other Topics Concern  . None   Social History Narrative   ** Merged History Encounter **       ACP - HCPOA [name] (c) [phone]; No CPR,  No mechanical ventilation.     Review of Systems  Constitutional: Negative for fever and chills.  Respiratory: Positive for shortness of breath (occasional, chronic no change). Negative for cough and wheezing.   Cardiovascular: Positive for chest pain (occasional, chronic - no change) and leg swelling. Negative for palpitations.  Gastrointestinal: Positive for abdominal pain and constipation.       Objective:   Filed Vitals:   06/09/15 1444  BP: 110/72  Pulse: 114  Temp: 98.2 F (36.8 C)  Resp: 20   Filed Weights   06/09/15 1444  Weight: 110 lb 8 oz (50.122 kg)   Body mass index is 19.58 kg/(m^2).   Physical Exam  Constitutional: No distress.  Elderly female  Cardiovascular: Normal rate and regular rhythm.   Murmur (2/6 systolic murmur) heard. Pulmonary/Chest: Effort  normal. No respiratory distress.  Bibasilar crackles  Abdominal: Soft. She exhibits no distension. There is no tenderness.  Musculoskeletal: She exhibits edema (1+ pitting edema b/l LE, no warmth, nontender).          Assessment & Plan:   See Problem List.   Follow up in 4 months sooner if needed

## 2015-06-09 NOTE — Assessment & Plan Note (Signed)
Bilateral left> right She did have some confusion regarding her dose of Lasix-she will continue 40 mg of Lasix daily Low-sodium diet stressed Monitor weights daily Can increase dose of Lasix for a short period of time if needed Has cardiology follow-up in January Follow-up with me in 4 months, sooner if needed

## 2015-06-09 NOTE — Progress Notes (Signed)
Pre visit review using our clinic review tool, if applicable. No additional management support is needed unless otherwise documented below in the visit note. 

## 2015-06-09 NOTE — Patient Instructions (Signed)
  We have reviewed your prior records including labs and tests today.   Medications reviewed and updated. Changes include adjusting your stool softeners/laxative to get a more regular soft bowel movement.  Return to taking 40 mg of lasix daily and monitor your weight if you are able.  Maintain a low sodium diet.    Please schedule followup in 4 months

## 2015-06-09 NOTE — Assessment & Plan Note (Signed)
Currently has constipation Bowel movements have varied over the last month or so due to trying to adjust senna and Colace I think we need to continue to adjust these medications so that she has regular soft bowel movements. They will try to assist her in this and call with any questions or concerns

## 2015-06-15 ENCOUNTER — Encounter: Payer: Self-pay | Admitting: Internal Medicine

## 2015-06-16 ENCOUNTER — Telehealth: Payer: Self-pay | Admitting: Internal Medicine

## 2015-06-16 ENCOUNTER — Telehealth: Payer: Self-pay | Admitting: Emergency Medicine

## 2015-06-16 ENCOUNTER — Other Ambulatory Visit: Payer: Self-pay | Admitting: Internal Medicine

## 2015-06-16 DIAGNOSIS — M545 Low back pain: Secondary | ICD-10-CM

## 2015-06-16 DIAGNOSIS — G894 Chronic pain syndrome: Secondary | ICD-10-CM

## 2015-06-16 MED ORDER — HYDROCODONE-ACETAMINOPHEN 5-325 MG PO TABS: 2.0000 | ORAL_TABLET | Freq: Four times a day (QID) | ORAL | Status: DC | PRN

## 2015-06-16 NOTE — Telephone Encounter (Signed)
ordered

## 2015-06-16 NOTE — Telephone Encounter (Signed)
Darl PikesSusan is calling to follow uop on mychart message from yesterday. Requesting a refill for   HYDROcodone-acetaminophen (NORCO/VICODIN) 5-325 MG tablet [[id number]     Please give susan a call today to follow up. She states that it is needed today. Thanks so much

## 2015-06-16 NOTE — Telephone Encounter (Signed)
Prescription printed. Daughter will pick up.

## 2015-06-16 NOTE — Telephone Encounter (Signed)
Please advise 

## 2015-06-16 NOTE — Telephone Encounter (Signed)
Spoke with pt's daughter to inform that RX was ready for pick-up. Informed daughter that Dr Lawerance BachBurns would like pt to start with pain management. Pts daughter was okay with this and would like to get the referral started.

## 2015-06-17 NOTE — Telephone Encounter (Signed)
[facility] status on [facility] for [facility]ien. Called pharmacy back inform rx was fax to them on yesterday. Pharmacist stated they did not get rx. Gave dr. Lawerance BachBurns authorization [facility]...[name]/lmb

## 2015-06-22 ENCOUNTER — Other Ambulatory Visit: Payer: Self-pay | Admitting: Internal Medicine

## 2015-06-24 ENCOUNTER — Encounter: Payer: Self-pay | Admitting: Internal Medicine

## 2015-06-29 ENCOUNTER — Telehealth: Payer: Self-pay | Admitting: Emergency Medicine

## 2015-06-29 NOTE — Telephone Encounter (Signed)
Spoke with Sharia ReeveJosh, NP from Palliative Care. He was requesting auth to increase pts pain meds. Per Dr Lawerance BachBurns, Sharia ReeveJosh, NP can increase meds to his knowledge. Fentanyl patch increased to 75 mcg, and will allow pt to take Hydrocodone, 2 tablets every 6 hours as needed. NP will follow-up with pt in a week to monitor pain.

## 2015-07-08 ENCOUNTER — Telehealth: Payer: Self-pay | Admitting: *Deleted

## 2015-07-08 NOTE — Telephone Encounter (Signed)
Received call pt is needing refill on her hydrocodone...[name]/lmb

## 2015-07-09 MED ORDER — HYDROCODONE-ACETAMINOPHEN 5-325 MG PO TABS: 2.0000 | ORAL_TABLET | ORAL | Status: DC | PRN

## 2015-07-09 MED ORDER — FENTANYL 50 MCG/HR TD PT72: 50.0000 ug | MEDICATED_PATCH | TRANSDERMAL | Status: DC

## 2015-07-09 NOTE — Telephone Encounter (Signed)
Daughter called back she states mom also need refill on her Fentanyl Patches...[name]/lmb

## 2015-07-09 NOTE — Telephone Encounter (Signed)
pls advise on msg below.../lmb 

## 2015-07-09 NOTE — Telephone Encounter (Signed)
printed

## 2015-07-09 NOTE — Telephone Encounter (Signed)
Ok - can you just confirm the dose - I know palliative care increased the dose within the past month

## 2015-07-09 NOTE — Telephone Encounter (Signed)
Notified daughter rx's ready for pick-up...[name]/lmb

## 2015-07-09 NOTE — Telephone Encounter (Signed)
Called daughter she stated that the dose is still 5/325, they increase the quantity she can take a day. Can take 2 every 4-6 hours. She take 6 pills a day...[name]/lmb

## 2015-07-13 ENCOUNTER — Other Ambulatory Visit: Payer: Self-pay | Admitting: Internal Medicine

## 2015-07-13 ENCOUNTER — Telehealth: Payer: Self-pay | Admitting: *Deleted

## 2015-07-13 MED ORDER — FENTANYL 25 MCG/HR TD PT72: 25.0000 ug | MEDICATED_PATCH | TRANSDERMAL | Status: DC

## 2015-07-13 NOTE — Telephone Encounter (Signed)
Daughter left msg on [facility] vm stating the Fentanyl patch was not correct. Called Darl PikesSusan back no answer [facility]...[name]/lmb

## 2015-07-13 NOTE — Telephone Encounter (Signed)
Prescription printed

## 2015-07-13 NOTE — Telephone Encounter (Signed)
Daughter states [facility] MD up mom dosage to 75 mg on her Fentanyl patches she uses 1 of the 50 & 1 of the 25 together. She stated the rx that was filled for the Fentanyl 50 mg will last her 2 months wantting to see can she get the same for the Fentanyl 25 mg...[name]/lmb

## 2015-07-13 NOTE — Telephone Encounter (Signed)
Notified Susan rx ready for pick-up.../lmb 

## 2015-07-19 ENCOUNTER — Ambulatory Visit: Payer: Self-pay | Admitting: Internal Medicine

## 2015-07-21 NOTE — Telephone Encounter (Signed)
Refill request sent to [facility]

## 2015-07-21 NOTE — Telephone Encounter (Signed)
Patient is following up on this prescription request for ambien Can you please give her a call when its ready

## 2015-07-30 ENCOUNTER — Other Ambulatory Visit: Payer: Self-pay | Admitting: Emergency Medicine

## 2015-07-30 NOTE — Telephone Encounter (Signed)
Pt told facility that she is supposed to be taking Lasix, 2xs daily. Is this correct? Please advise

## 2015-07-31 MED ORDER — FUROSEMIDE 20 MG PO TABS: ORAL_TABLET | ORAL | Status: AC

## 2015-07-31 NOTE — Telephone Encounter (Signed)
She has lasix 20 mg pills and should be taking two pills (40 mg) once a day.  She could take 20 mg twice a day, but it is more effective taking both pills once a day in the morning - it also contains the increased urination from the medication to the morning.  rx sent to [facility].

## 2015-08-04 ENCOUNTER — Encounter: Payer: Self-pay | Admitting: Internal Medicine

## 2015-08-06 NOTE — Progress Notes (Signed)
HPI: FU CAD s/p CABG in 2004, CHF, carotid stenosis, HTN, HL, atrial fibrillation. She was admitted 8/14 with elevated troponin in the setting of CHF. EF was 25-30% with anteroseptal and apical AK on echo. This was felt to represent stress-induced cardiomyopathy. Conservative measures were felt to be appropriate if possible given advanced age. She improved with medical Rx. Myoview 02/2013 was low risk without ischemia. She did have evidence of anterior scar. EF was 49%. She is not anticoagulated for atrial fibrillation due to history of fall risk. Admitted 12/15 with pneumonia and NSTEMI; treated conservatively for MI at her request. Last echo 3/16 showed normal LV function, grade 1 diastolic dysfunction, mild MR, moderate LAE, mild RAE. Since last seen, She has mild dyspnea on exertion but no orthopnea, PND or pedal edema. Occasional chest tightness for 5 minutes that resolved spontaneously. Not exertional. No syncope.  Current Outpatient Prescriptions  Medication Sig Dispense Refill  . ALPRAZolam (XANAX) 0.25 MG tablet Take 1 tablet (0.25 mg total) by mouth 2 (two) times daily as needed for anxiety. 30 tablet 5  . aspirin 81 MG tablet Take 81 mg by mouth at bedtime.     . Cholecalciferol (VITAMIN D PO) Take 500 Units by mouth daily.    [name] docusate sodium (DOK) 100 MG capsule Take 1 capsule (100 mg total) by mouth daily. 60 capsule 11  . fentaNYL (DURAGESIC) 25 MCG/HR patch Place 1 patch (25 mcg total) onto the skin every 3 (three) days. In addition to 50 mcg patch 10 patch 0  . fentaNYL (DURAGESIC) 50 MCG/HR Place 1 patch (50 mcg total) onto the skin every 3 (three) days. 10 patch 0  . furosemide (LASIX) 20 MG tablet TAKE 40 MG BY MOUTH DAILY 60 tablet 5  . HYDROcodone-acetaminophen (NORCO/VICODIN) 5-325 MG tablet Take 2 tablets by mouth every 4 (four) hours as needed. 180 tablet 0  . LYRICA 50 MG capsule TAKE ONE CAPSULE THREE TIMES DAILY 270 capsule 0  . metoprolol tartrate (LOPRESSOR) 25 MG  tablet Take 0.5 tablets (12.5 mg total) by mouth 2 (two) times daily. 30 tablet 6  . Multiple Vitamin (MULTIVITAMIN WITH MINERALS) TABS Take 1 tablet by mouth daily.    . nitroGLYCERIN (NITROSTAT) 0.4 MG SL tablet Place 1 tablet (0.4 mg total) under the tongue every 5 (five) minutes as needed for chest pain (upt o 3 doses). 25 tablet 4  . Omega-3 Fatty Acids (FISH OIL) 1000 MG CAPS Take 1 capsule by mouth daily.    . pantoprazole (PROTONIX) 40 MG tablet Take 1 tablet (40 mg total) by mouth daily. 30 tablet 6  . potassium chloride SA (K-DUR,KLOR-CON) 20 MEQ tablet Take 1 tablet (20 mEq total) by mouth daily. 90 tablet 3  . pravastatin (PRAVACHOL) 40 MG tablet Take 1 tablet (40 mg total) by mouth every evening. 90 tablet 1  . PROCTOSOL HC 2.5 % rectal cream APPLY RECTALLY TWICE DAILY 28.35 g 3  . senna (SENOKOT) 8.6 MG TABS tablet Take 2 tablets (17.2 mg total) by mouth at bedtime. 120 each 0  . venlafaxine XR (EFFEXOR-XR) 75 MG 24 hr capsule TAKE ONE CAPSULE EACH DAY WITH BREAKFAST 90 capsule 1  . vitamin C (ASCORBIC ACID) 500 MG tablet Take 500 mg by mouth daily.    [name] zolpidem (AMBIEN) 5 MG tablet TAKE ONE TABLET AT BEDTIME AS NEEDED FOR SLEEP 30 tablet 0   No current facility-administered medications for this visit.     Past Medical History  Diagnosis Date  . SPINAL STENOSIS   . LOW BACK PAIN     chronic, follows with Nsurg for ESI  . FRACTURE, PELVIS, RIGHT 12/2009  . HIP FRACTURE, LEFT 09/2009    s/p L THA  . ALLERGIC RHINITIS   . Carpal tunnel syndrome   . Hypertension   . HYPERLIPIDEMIA   . GERD   . Arthritis   . Osteoporosis   . Atrial fibrillation (HCC)     no anticoag due to fall risk/age  . DEPRESSION   . CORONARY ARTERY DISEASE 2004    a.  LHC (01/2003):  3 v CAD => s/p CABG.;   b.  Myoview (02/2013):  Anterior scar; no ischemia; EF 49%.    . Systolic congestive heart failure (HCC)   . 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 Echo (02/2013):  EF 25-30%.;   b.   Echo (04/04/13):  Mild focal basal septal hypertrophy, EF 60-65%, no RWMA, Gr 2 DD, mild to mod MAC, mild bileaflet MVP, mild to mod MR, mild to mod LAE, mild RAE, PASP 48.  [name] H/O Doppler ultrasound     Renal Artery [place] (09/2010):  No RAS.  . Carotid artery occlusion     Carotid US (05/2010):  RICA 70-99%; LICA 0-49%  . Anemia     Past Surgical History  Procedure Laterality Date  . (l) hip hemiarthroplasty  09/2009  . Appendectomy  1940  . Eye surgery      both cataracts  . Coronary artery bypass graft  2004  . Carpal tunnel release  2004    right and left-dsc  . Ulnar nerve transposition  2009    lt   . Vein ligation      legs  . Colonoscopy    . I&d extremity Right 12/26/2012    Procedure: IRRIGATION AND DEBRIDEMENT OF RIGHT LEG, SURGICAL PREP PLACEMENT OF A CELL AND VAC   ;  Surgeon: Wayland Denis, DO;  Location: [facility];  Service: Plastics;  Laterality: Right;    Social History   Social History  . Marital Status: Divorced    Spouse Name: N/A  . Number of Children: N/A  . Years of Education: N/A   Occupational History  . Not on file.   Social History Main Topics  . Smoking status: Former Smoker    Types: Cigarettes  . Smokeless tobacco: Never Used  . Alcohol Use: No  . Drug Use: No  . Sexual Activity: No   Other Topics Concern  . Not on file   Social History Narrative   ** Merged History Encounter **       ACP - HCPOA Margarette Asal (c) [phone]; No CPR,  No mechanical ventilation.     Family History  Problem Relation Age of Onset  . Arthritis Mother   . Breast cancer Mother   . Heart disease Mother   . Hypertension Mother   . Arthritis Father   . Heart disease Father   . Hypertension Father   . Arthritis Maternal Grandmother   . Arthritis Maternal Grandfather   . Arthritis Paternal Grandmother   . Arthritis Paternal Grandfather   . Breast cancer Daughter   . Cancer Mother 91  . Emphysema Father 62  . Heart attack Brother 65     ROS: no fevers or chills, productive cough, hemoptysis, dysphasia, odynophagia, melena, hematochezia, dysuria, hematuria, rash, seizure activity, orthopnea, PND, pedal edema, claudication. Remaining systems are negative.  Physical Exam: Well-developed frail in no acute distress.  Skin is warm and dry.  HEENT is normal.  Neck is supple.  Chest is clear to auscultation with normal expansion.  Cardiovascular exam is irregular Abdominal exam nontender or distended. No masses palpated. Extremities show no edema. neuro grossly intact

## 2015-08-09 ENCOUNTER — Ambulatory Visit (INDEPENDENT_AMBULATORY_CARE_PROVIDER_SITE_OTHER): Payer: Medicare Other | Admitting: Cardiology

## 2015-08-09 ENCOUNTER — Telehealth: Payer: Self-pay | Admitting: Emergency Medicine

## 2015-08-09 ENCOUNTER — Encounter: Payer: Self-pay | Admitting: Cardiology

## 2015-08-09 VITALS — BP 110/56 | HR 66 | Ht 63.0 in | Wt 109.7 lb

## 2015-08-09 DIAGNOSIS — I480 Paroxysmal atrial fibrillation: Secondary | ICD-10-CM

## 2015-08-09 DIAGNOSIS — I679 Cerebrovascular disease, unspecified: Secondary | ICD-10-CM | POA: Diagnosis not present

## 2015-08-09 DIAGNOSIS — I10 Essential (primary) hypertension: Secondary | ICD-10-CM

## 2015-08-09 DIAGNOSIS — I2510 Atherosclerotic heart disease of native coronary artery without angina pectoris: Secondary | ICD-10-CM | POA: Diagnosis not present

## 2015-08-09 MED ORDER — HYDROCODONE-ACETAMINOPHEN 5-325 MG PO TABS: 2.0000 | ORAL_TABLET | ORAL | Status: DC | PRN

## 2015-08-09 MED ORDER — FENTANYL 25 MCG/HR TD PT72: 25.0000 ug | MEDICATED_PATCH | TRANSDERMAL | Status: DC

## 2015-08-09 MED ORDER — FENTANYL 50 MCG/HR TD PT72: 50.0000 ug | MEDICATED_PATCH | TRANSDERMAL | Status: DC

## 2015-08-09 NOTE — Assessment & Plan Note (Signed)
Blood pressure controlled. Continue present medications. 

## 2015-08-09 NOTE — Assessment & Plan Note (Signed)
Continue present dose of Lasix. 

## 2015-08-09 NOTE — Assessment & Plan Note (Signed)
We have elected not to pursue follow-up carotid Dopplers as she would not ever wish to proceed with intervention.

## 2015-08-09 NOTE — Assessment & Plan Note (Signed)
Continue aspirin. She does occasionally have chest tightness. We discussed further evaluation today. She clearly does not want any procedures or testing which I think is appropriate given her age.

## 2015-08-09 NOTE — Assessment & Plan Note (Signed)
Plan to continue beta blocker for rate control. Continue aspirin. We discussed the risks and benefits of anticoagulation and we feel the risk outweighs the benefit given age and instability. Also with history of falls.

## 2015-08-09 NOTE — Telephone Encounter (Signed)
Rxs printed. Pts daughter has been informed that rxs are ready for pick-up

## 2015-08-09 NOTE — Assessment & Plan Note (Signed)
Continue statin. 

## 2015-08-09 NOTE — Patient Instructions (Signed)
Your physician wants you to follow-up in: 6 MONTHS WITH DR CRENSHAW You will receive a reminder letter in the mail two months in advance. If you don't receive a letter, please call our office to schedule the follow-up appointment.   If you need a refill on your cardiac medications before your next appointment, please call your pharmacy.  

## 2015-08-20 ENCOUNTER — Other Ambulatory Visit: Payer: Self-pay | Admitting: Internal Medicine

## 2015-08-23 ENCOUNTER — Other Ambulatory Visit: Payer: Self-pay | Admitting: Internal Medicine

## 2015-08-23 NOTE — Telephone Encounter (Signed)
Rx fax back to [facility]...[name]

## 2015-08-23 NOTE — Telephone Encounter (Signed)
MD out of office this week. Pls advise on med refill...[name]

## 2015-08-23 NOTE — Telephone Encounter (Signed)
Done hardcopy to Corinne  

## 2015-08-27 ENCOUNTER — Ambulatory Visit (INDEPENDENT_AMBULATORY_CARE_PROVIDER_SITE_OTHER): Payer: Medicare Other | Admitting: Nurse Practitioner

## 2015-08-27 ENCOUNTER — Encounter: Payer: Self-pay | Admitting: Nurse Practitioner

## 2015-08-27 VITALS — BP 100/60 | HR 74 | Temp 98.3°F | Resp 16 | Wt 109.0 lb

## 2015-08-27 DIAGNOSIS — I2510 Atherosclerotic heart disease of native coronary artery without angina pectoris: Secondary | ICD-10-CM

## 2015-08-27 DIAGNOSIS — S81801A Unspecified open wound, right lower leg, initial encounter: Secondary | ICD-10-CM | POA: Diagnosis not present

## 2015-08-27 MED ORDER — DOXYCYCLINE HYCLATE 100 MG PO CAPS: 100.0000 mg | ORAL_CAPSULE | Freq: Two times a day (BID) | ORAL | Status: DC

## 2015-08-27 NOTE — Progress Notes (Signed)
Patient ID: Julia Black, female    DOB: 09-May-1919  Age: 82 y.o. MRN: [id number]  CC: Leg Injury   HPI Julia Black presents for CC of right leg wound x 2 weeks.  1) hit her leg on her walker 2 weeks ago  Her son is with her today and has been helping her with wound healing. He had been to the wound center in [place] in the past and had pads with seaweed and silvadene product on her leg, when they change it it looks bloody he reports. They keep it wrapped. Denies odor or fevers.   Denies other treatment to date. Has an appointment already for next Thursday with the Wound Center in [place]. Advised to keep [facility]- no appointments until March 9th.   History Julia Black has a past medical history of SPINAL STENOSIS; LOW BACK PAIN; FRACTURE, PELVIS, RIGHT (12/2009); HIP FRACTURE, LEFT (09/2009); ALLERGIC RHINITIS; Carpal tunnel syndrome; Hypertension; HYPERLIPIDEMIA; GERD; Arthritis; Osteoporosis; Atrial fibrillation (HCC); DEPRESSION; CORONARY ARTERY DISEASE (2004); Systolic congestive heart failure (HCC); Cardiomyopathy (HCC); H/O Doppler ultrasound; Carotid artery occlusion; and Anemia.   She has past surgical history that includes (L) hip hemiarthroplasty (09/2009); Appendectomy (1940); Eye surgery; Coronary artery bypass graft (2004); Carpal tunnel release (2004); Ulnar nerve transposition (2009); Vein ligation; Colonoscopy; and I&D extremity (Right, 12/26/2012).   Her family history includes Arthritis in her father, maternal grandfather, maternal grandmother, mother, paternal grandfather, and paternal grandmother; Breast cancer in her daughter and mother; Cancer (age of onset: 58) in her mother; Emphysema (age of onset: 80) in her father; Heart attack (age of onset: 64) in her brother; Heart disease in her father and mother; Hypertension in her father and mother.She reports that she has quit smoking. Her smoking use included Cigarettes. She has never used smokeless tobacco. She reports that she does  not drink alcohol or use illicit drugs.  Outpatient Prescriptions Prior to Visit  Medication Sig Dispense Refill  . ALPRAZolam (XANAX) 0.25 MG tablet Take 1 tablet (0.25 mg total) by mouth 2 (two) times daily as needed for anxiety. 30 tablet 5  . aspirin 81 MG tablet Take 81 mg by mouth at bedtime.     . Cholecalciferol (VITAMIN D PO) Take 500 Units by mouth daily.    [name] docusate sodium (DOK) 100 MG capsule Take 1 capsule (100 mg total) by mouth daily. 60 capsule 11  . fentaNYL (DURAGESIC) 25 MCG/HR patch Place 1 patch (25 mcg total) onto the skin every 3 (three) days. In addition to 50 mcg patch 10 patch 0  . fentaNYL (DURAGESIC) 50 MCG/HR Place 1 patch (50 mcg total) onto the skin every 3 (three) days. 10 patch 0  . furosemide (LASIX) 20 MG tablet TAKE 40 MG BY MOUTH DAILY 60 tablet 5  . HYDROcodone-acetaminophen (NORCO/VICODIN) 5-325 MG tablet Take 2 tablets by mouth every 4 (four) hours as needed. 180 tablet 0  . LYRICA 50 MG capsule TAKE ONE CAPSULE THREE TIMES DAILY 270 capsule 0  . metoprolol tartrate (LOPRESSOR) 25 MG tablet Take 0.5 tablets (12.5 mg total) by mouth 2 (two) times daily. 30 tablet 6  . Multiple Vitamin (MULTIVITAMIN WITH MINERALS) TABS Take 1 tablet by mouth daily.    . nitroGLYCERIN (NITROSTAT) 0.4 MG SL tablet Place 1 tablet (0.4 mg total) under the tongue every 5 (five) minutes as needed for chest pain (upt o 3 doses). 25 tablet 4  . Omega-3 Fatty Acids (FISH OIL) 1000 MG CAPS Take 1 capsule by  mouth daily.    . pantoprazole (PROTONIX) 40 MG tablet Take 1 tablet (40 mg total) by mouth daily. 30 tablet 6  . potassium chloride SA (K-DUR,KLOR-CON) 20 MEQ tablet Take 1 tablet (20 mEq total) by mouth daily. 90 tablet 3  . pravastatin (PRAVACHOL) 40 MG tablet Take 1 tablet (40 mg total) by mouth every evening. 90 tablet 1  . PROCTOSOL HC 2.5 % rectal cream APPLY RECTALLY TWICE DAILY 28.35 g 3  . senna (SENOKOT) 8.6 MG TABS tablet Take 2 tablets (17.2 mg total) by mouth at  bedtime. 120 each 0  . venlafaxine XR (EFFEXOR-XR) 75 MG 24 hr capsule TAKE ONE CAPSULE EACH DAY WITH BREAKFAST 90 capsule 1  . vitamin C (ASCORBIC ACID) 500 MG tablet Take 500 mg by mouth daily.    [name] zolpidem (AMBIEN) 5 MG tablet TAKE ONE TABLET AT BEDTIME AS NEEDED FOR SLEEP 30 tablet 0   No facility-administered medications prior to visit.    ROS Review of Systems  Constitutional: Negative for fever, chills, diaphoresis and fatigue.  Respiratory: Negative for cough, chest tightness, shortness of breath and wheezing.   Cardiovascular: Negative for chest pain, palpitations and leg swelling.  Skin: Positive for wound. Negative for color change, pallor and rash.    Objective:  BP 100/60 mmHg  Pulse 74  Temp(Src) 98.3 F (36.8 C) (Oral)  Resp 16  Wt 109 lb (49.442 kg)  SpO2   Physical Exam  Constitutional: She is oriented to person, place, and time. She appears well-developed and well-nourished. No distress.  HENT:  Head: Normocephalic and atraumatic.  Right Ear: External ear normal.  Left Ear: External ear normal.  Cardiovascular: Normal rate, regular rhythm and normal heart sounds.  Exam reveals no gallop and no friction rub.   No murmur heard. Not in Afib today  Pulmonary/Chest: Effort normal and breath sounds normal. No respiratory distress. She has no wheezes. She has no rales. She exhibits no tenderness.  Musculoskeletal:       Legs: 1-1.25 inch in diameter poorly healing wound. Granulation tissue visualized, rolled edges, no purulent drainage or odor. Non-tender to palpation, erythema is the same bilaterally of LE, blanching with brisk refill of area around the wound.   Neurological: She is alert and oriented to person, place, and time.  Using rolling walker  Skin: Skin is warm and dry. No rash noted. She is not diaphoretic.  Psychiatric: She has a normal mood and affect. Her behavior is normal. Judgment and thought content normal.   Assessment & Plan:   Julia Black was  seen today for leg injury.  Diagnoses and all orders for this visit:  Wound of right leg, initial encounter  Other orders -     doxycycline (VIBRAMYCIN) 100 MG capsule; Take 1 capsule (100 mg total) by mouth 2 (two) times daily.  I am having Julia Black start on doxycycline. I am also having her maintain her aspirin, multivitamin with minerals, potassium chloride SA, ALPRAZolam, metoprolol tartrate, nitroGLYCERIN, pantoprazole, pravastatin, PROCTOSOL HC, Cholecalciferol (VITAMIN D PO), vitamin C, senna, docusate sodium, venlafaxine XR, furosemide, HYDROcodone-acetaminophen, fentaNYL, fentaNYL, Fish Oil, zolpidem, and LYRICA.  Meds ordered this encounter  Medications  . doxycycline (VIBRAMYCIN) 100 MG capsule    Sig: Take 1 capsule (100 mg total) by mouth 2 (two) times daily.    Dispense:  10 capsule    Refill:  0    Order Specific Question:  Supervising Provider    Answer:  Duncan Dull L [[id number]]  Follow-up: Return if symptoms worsen or fail to improve.

## 2015-08-27 NOTE — Progress Notes (Signed)
Pre visit review using our clinic review tool, if applicable. No additional management support is needed unless otherwise documented below in the visit note. 

## 2015-08-27 NOTE — Patient Instructions (Addendum)
[facility] wound products at [facility]  Keep the [facility] wound center appointment

## 2015-08-27 NOTE — Assessment & Plan Note (Signed)
New onset Undressed and redressed with gauze and telfa by CMA Advised son to try [facility] wound care products (comparable to [facility]) found at [facility]. Advised to keep wound care center appointment for next Thursday at [facility].  Doxycyline script printed and given to son to fill if odor, purulent drainage, fever ect...  FU prn worsening/failure to improve.

## 2015-09-02 ENCOUNTER — Telehealth: Payer: Self-pay | Admitting: Emergency Medicine

## 2015-09-02 MED ORDER — HYDROCODONE-ACETAMINOPHEN 5-325 MG PO TABS: 2.0000 | ORAL_TABLET | ORAL | Status: DC | PRN

## 2015-09-02 MED ORDER — FENTANYL 50 MCG/HR TD PT72: 50.0000 ug | MEDICATED_PATCH | TRANSDERMAL | Status: DC

## 2015-09-02 MED ORDER — FENTANYL 25 MCG/HR TD PT72: 25.0000 ug | MEDICATED_PATCH | TRANSDERMAL | Status: DC

## 2015-09-02 NOTE — Telephone Encounter (Signed)
rx's printed 

## 2015-09-02 NOTE — Telephone Encounter (Signed)
LVM informing pts daughter rxs were ready for pick-up

## 2015-09-02 NOTE — Telephone Encounter (Signed)
Pts daughter called requesting refills for Hydrocodone and Fentanyl patches. Pt will run out on Sunday of meds. Please advise.

## 2015-09-17 ENCOUNTER — Other Ambulatory Visit: Payer: Self-pay | Admitting: Internal Medicine

## 2015-09-17 NOTE — Telephone Encounter (Signed)
Faxed RX to [facility] 

## 2015-09-29 ENCOUNTER — Other Ambulatory Visit: Payer: Self-pay | Admitting: Internal Medicine

## 2015-09-29 ENCOUNTER — Telehealth: Payer: Self-pay | Admitting: Emergency Medicine

## 2015-09-29 ENCOUNTER — Other Ambulatory Visit (INDEPENDENT_AMBULATORY_CARE_PROVIDER_SITE_OTHER): Payer: Medicare Other

## 2015-09-29 DIAGNOSIS — R300 Dysuria: Secondary | ICD-10-CM

## 2015-09-29 LAB — URINALYSIS, ROUTINE W REFLEX MICROSCOPIC
BILIRUBIN URINE: NEGATIVE
KETONES UR: NEGATIVE
NITRITE: NEGATIVE
PH: 6.5 (ref 5.0–8.0)
SPECIFIC GRAVITY, URINE: 1.01 (ref 1.000–1.030)
UROBILINOGEN UA: 0.2 (ref 0.0–1.0)
Urine Glucose: NEGATIVE

## 2015-09-29 MED ORDER — CIPROFLOXACIN HCL 250 MG PO TABS: 250.0000 mg | ORAL_TABLET | Freq: Two times a day (BID) | ORAL | Status: DC

## 2015-09-29 NOTE — Telephone Encounter (Signed)
Pts daughter called to see if an order could be placed for a UA, pt thinks she has UTI. Pt saw Naomie DeanCarrie Doss in Feb and last saw us in Nov.

## 2015-09-29 NOTE — Telephone Encounter (Signed)
ordered

## 2015-09-29 NOTE — Telephone Encounter (Signed)
Spoke with pts daughter to inform.  

## 2015-10-01 LAB — URINE CULTURE: Colony Count: >=100000

## 2015-10-06 ENCOUNTER — Other Ambulatory Visit: Payer: Self-pay | Admitting: Emergency Medicine

## 2015-10-06 MED ORDER — FENTANYL 25 MCG/HR TD PT72: 25.0000 ug | MEDICATED_PATCH | TRANSDERMAL | Status: DC

## 2015-10-06 MED ORDER — FENTANYL 50 MCG/HR TD PT72: 50.0000 ug | MEDICATED_PATCH | TRANSDERMAL | Status: AC

## 2015-10-06 MED ORDER — HYDROCODONE-ACETAMINOPHEN 5-325 MG PO TABS: 2.0000 | ORAL_TABLET | ORAL | Status: AC | PRN

## 2015-10-06 NOTE — Telephone Encounter (Signed)
Pts daughter called in for refills of Hydrocodone and patches. Please advise. Last reflll 09/02/2015

## 2015-10-06 NOTE — Telephone Encounter (Signed)
Spoke with pts daughter to inform RX is read.

## 2015-10-16 ENCOUNTER — Other Ambulatory Visit: Payer: Self-pay | Admitting: Internal Medicine

## 2015-10-18 ENCOUNTER — Telehealth: Payer: Self-pay

## 2015-10-18 NOTE — Telephone Encounter (Signed)
PA initiated via CoverMyMeds Key [phone] [id number]

## 2015-10-18 NOTE — Telephone Encounter (Signed)
PA APPROVED through 10/17/2016 

## 2015-10-18 NOTE — Telephone Encounter (Signed)
RX faxed to pharm  

## 2015-10-19 ENCOUNTER — Telehealth: Payer: Self-pay | Admitting: Emergency Medicine

## 2015-10-19 ENCOUNTER — Other Ambulatory Visit (INDEPENDENT_AMBULATORY_CARE_PROVIDER_SITE_OTHER): Payer: Medicare Other

## 2015-10-19 ENCOUNTER — Other Ambulatory Visit: Payer: Self-pay | Admitting: Internal Medicine

## 2015-10-19 DIAGNOSIS — R300 Dysuria: Secondary | ICD-10-CM

## 2015-10-19 LAB — URINALYSIS, ROUTINE W REFLEX MICROSCOPIC
Bilirubin Urine: NEGATIVE
Ketones, ur: NEGATIVE
NITRITE: NEGATIVE
Specific Gravity, Urine: 1.01 (ref 1.000–1.030)
Urine Glucose: NEGATIVE
Urobilinogen, UA: 0.2 (ref 0.0–1.0)
pH: 6.5 (ref 5.0–8.0)

## 2015-10-19 MED ORDER — NITROFURANTOIN MONOHYD MACRO 100 MG PO CAPS: 100.0000 mg | ORAL_CAPSULE | Freq: Two times a day (BID) | ORAL | Status: AC

## 2015-10-19 NOTE — Telephone Encounter (Signed)
Spoke with pts daughter to inform.  

## 2015-10-19 NOTE — Telephone Encounter (Signed)
Pts daughter called in to inform that pt thinks she has another UTI. She was up 5xs during the night last night and has a burning sensation when she urinates. She would like to know if she could bring in a specimen for this or does she needs a OV? Please advise.

## 2015-10-19 NOTE — Telephone Encounter (Signed)
Ok to bring in specimen.  Ua, Ucx ordered

## 2015-10-21 LAB — URINE CULTURE: Colony Count: >=100000

## 2015-10-23 ENCOUNTER — Emergency Department (HOSPITAL_COMMUNITY): Payer: Medicare Other

## 2015-10-23 ENCOUNTER — Inpatient Hospital Stay (HOSPITAL_COMMUNITY)
Admission: EM | Admit: 2015-10-23 | Discharge: 2015-11-08 | DRG: 125 | Disposition: E | Payer: Medicare Other | Attending: General Surgery | Admitting: General Surgery

## 2015-10-23 ENCOUNTER — Encounter (HOSPITAL_COMMUNITY): Payer: Self-pay | Admitting: *Deleted

## 2015-10-23 DIAGNOSIS — E785 Hyperlipidemia, unspecified: Secondary | ICD-10-CM | POA: Diagnosis present

## 2015-10-23 DIAGNOSIS — S12000A Unspecified displaced fracture of first cervical vertebra, initial encounter for closed fracture: Secondary | ICD-10-CM | POA: Diagnosis present

## 2015-10-23 DIAGNOSIS — R0902 Hypoxemia: Secondary | ICD-10-CM | POA: Diagnosis present

## 2015-10-23 DIAGNOSIS — R680 Hypothermia, not associated with low environmental temperature: Secondary | ICD-10-CM | POA: Diagnosis present

## 2015-10-23 DIAGNOSIS — H3530 Unspecified macular degeneration: Secondary | ICD-10-CM | POA: Diagnosis present

## 2015-10-23 DIAGNOSIS — M4800 Spinal stenosis, site unspecified: Secondary | ICD-10-CM | POA: Diagnosis present

## 2015-10-23 DIAGNOSIS — Z7982 Long term (current) use of aspirin: Secondary | ICD-10-CM

## 2015-10-23 DIAGNOSIS — M810 Age-related osteoporosis without current pathological fracture: Secondary | ICD-10-CM | POA: Diagnosis present

## 2015-10-23 DIAGNOSIS — D62 Acute posthemorrhagic anemia: Secondary | ICD-10-CM | POA: Diagnosis present

## 2015-10-23 DIAGNOSIS — Z951 Presence of aortocoronary bypass graft: Secondary | ICD-10-CM

## 2015-10-23 DIAGNOSIS — I2510 Atherosclerotic heart disease of native coronary artery without angina pectoris: Secondary | ICD-10-CM | POA: Diagnosis present

## 2015-10-23 DIAGNOSIS — R296 Repeated falls: Secondary | ICD-10-CM

## 2015-10-23 DIAGNOSIS — I429 Cardiomyopathy, unspecified: Secondary | ICD-10-CM | POA: Diagnosis present

## 2015-10-23 DIAGNOSIS — Z87891 Personal history of nicotine dependence: Secondary | ICD-10-CM

## 2015-10-23 DIAGNOSIS — S32591A Other specified fracture of right pubis, initial encounter for closed fracture: Secondary | ICD-10-CM | POA: Diagnosis present

## 2015-10-23 DIAGNOSIS — I5022 Chronic systolic (congestive) heart failure: Secondary | ICD-10-CM | POA: Diagnosis present

## 2015-10-23 DIAGNOSIS — Z9841 Cataract extraction status, right eye: Secondary | ICD-10-CM | POA: Diagnosis not present

## 2015-10-23 DIAGNOSIS — M47812 Spondylosis without myelopathy or radiculopathy, cervical region: Secondary | ICD-10-CM | POA: Diagnosis present

## 2015-10-23 DIAGNOSIS — J9811 Atelectasis: Secondary | ICD-10-CM | POA: Diagnosis not present

## 2015-10-23 DIAGNOSIS — I4891 Unspecified atrial fibrillation: Secondary | ICD-10-CM | POA: Diagnosis present

## 2015-10-23 DIAGNOSIS — S0993XA Unspecified injury of face, initial encounter: Secondary | ICD-10-CM | POA: Diagnosis present

## 2015-10-23 DIAGNOSIS — I252 Old myocardial infarction: Secondary | ICD-10-CM

## 2015-10-23 DIAGNOSIS — K219 Gastro-esophageal reflux disease without esophagitis: Secondary | ICD-10-CM | POA: Diagnosis present

## 2015-10-23 DIAGNOSIS — W19XXXA Unspecified fall, initial encounter: Secondary | ICD-10-CM | POA: Diagnosis present

## 2015-10-23 DIAGNOSIS — J9601 Acute respiratory failure with hypoxia: Secondary | ICD-10-CM

## 2015-10-23 DIAGNOSIS — Z79899 Other long term (current) drug therapy: Secondary | ICD-10-CM | POA: Diagnosis not present

## 2015-10-23 DIAGNOSIS — E876 Hypokalemia: Secondary | ICD-10-CM | POA: Diagnosis present

## 2015-10-23 DIAGNOSIS — Z23 Encounter for immunization: Secondary | ICD-10-CM

## 2015-10-23 DIAGNOSIS — M545 Low back pain, unspecified: Secondary | ICD-10-CM | POA: Diagnosis present

## 2015-10-23 DIAGNOSIS — F329 Major depressive disorder, single episode, unspecified: Secondary | ICD-10-CM | POA: Diagnosis present

## 2015-10-23 DIAGNOSIS — Z789 Other specified health status: Secondary | ICD-10-CM | POA: Diagnosis not present

## 2015-10-23 DIAGNOSIS — W1830XA Fall on same level, unspecified, initial encounter: Secondary | ICD-10-CM | POA: Diagnosis present

## 2015-10-23 DIAGNOSIS — Z6820 Body mass index (BMI) 20.0-20.9, adult: Secondary | ICD-10-CM | POA: Diagnosis not present

## 2015-10-23 DIAGNOSIS — Z96642 Presence of left artificial hip joint: Secondary | ICD-10-CM | POA: Diagnosis present

## 2015-10-23 DIAGNOSIS — Z515 Encounter for palliative care: Secondary | ICD-10-CM | POA: Diagnosis not present

## 2015-10-23 DIAGNOSIS — I5030 Unspecified diastolic (congestive) heart failure: Secondary | ICD-10-CM | POA: Diagnosis present

## 2015-10-23 DIAGNOSIS — R4182 Altered mental status, unspecified: Secondary | ICD-10-CM | POA: Diagnosis not present

## 2015-10-23 DIAGNOSIS — Z9842 Cataract extraction status, left eye: Secondary | ICD-10-CM | POA: Diagnosis not present

## 2015-10-23 DIAGNOSIS — E440 Moderate protein-calorie malnutrition: Secondary | ICD-10-CM | POA: Diagnosis present

## 2015-10-23 DIAGNOSIS — I5031 Acute diastolic (congestive) heart failure: Secondary | ICD-10-CM | POA: Diagnosis not present

## 2015-10-23 DIAGNOSIS — S0990XA Unspecified injury of head, initial encounter: Secondary | ICD-10-CM | POA: Diagnosis present

## 2015-10-23 DIAGNOSIS — S0231XB Fracture of orbital floor, right side, initial encounter for open fracture: Secondary | ICD-10-CM | POA: Diagnosis present

## 2015-10-23 DIAGNOSIS — S022XXA Fracture of nasal bones, initial encounter for closed fracture: Secondary | ICD-10-CM | POA: Diagnosis present

## 2015-10-23 DIAGNOSIS — Z8249 Family history of ischemic heart disease and other diseases of the circulatory system: Secondary | ICD-10-CM

## 2015-10-23 DIAGNOSIS — Z66 Do not resuscitate: Secondary | ICD-10-CM | POA: Diagnosis present

## 2015-10-23 DIAGNOSIS — I110 Hypertensive heart disease with heart failure: Secondary | ICD-10-CM | POA: Diagnosis present

## 2015-10-23 LAB — COMPREHENSIVE METABOLIC PANEL
ALBUMIN: 2.8 g/dL — AB (ref 3.5–5.0)
ALT: 25 U/L (ref 14–54)
AST: 42 U/L — AB (ref 15–41)
Alkaline Phosphatase: 144 U/L — ABNORMAL HIGH (ref 38–126)
Anion gap: 12 (ref 5–15)
BILIRUBIN TOTAL: 0.8 mg/dL (ref 0.3–1.2)
BUN: 26 mg/dL — AB (ref 6–20)
CO2: 31 mmol/L (ref 22–32)
CREATININE: 0.66 mg/dL (ref 0.44–1.00)
Calcium: 8.7 mg/dL — ABNORMAL LOW (ref 8.9–10.3)
Chloride: 100 mmol/L — ABNORMAL LOW (ref 101–111)
GFR calc Af Amer: >60 mL/min (ref >60)
GFR calc non Af Amer: >60 mL/min (ref >60)
GLUCOSE: 116 mg/dL — AB (ref 65–99)
POTASSIUM: 3.4 mmol/L — AB (ref 3.5–5.1)
Sodium: 143 mmol/L (ref 135–145)
TOTAL PROTEIN: 6.4 g/dL — AB (ref 6.5–8.1)

## 2015-10-23 LAB — URINALYSIS, ROUTINE W REFLEX MICROSCOPIC
Bilirubin Urine: NEGATIVE
Glucose, UA: NEGATIVE mg/dL
HGB URINE DIPSTICK: NEGATIVE
Ketones, ur: 15 mg/dL — AB
LEUKOCYTES UA: NEGATIVE
NITRITE: NEGATIVE
PROTEIN: NEGATIVE mg/dL
SPECIFIC GRAVITY, URINE: 1.013 (ref 1.005–1.030)
pH: 6 (ref 5.0–8.0)

## 2015-10-23 LAB — CBC WITH DIFFERENTIAL/PLATELET
Basophils Absolute: 0 10*3/uL (ref 0.0–0.1)
Basophils Relative: 0 %
EOS ABS: 0 10*3/uL (ref 0.0–0.7)
EOS PCT: 0 %
HCT: 35.8 % — ABNORMAL LOW (ref 36.0–46.0)
HEMOGLOBIN: 11.1 g/dL — AB (ref 12.0–15.0)
Lymphocytes Relative: 4 %
Lymphs Abs: 0.3 10*3/uL — ABNORMAL LOW (ref 0.7–4.0)
MCH: 27.8 pg (ref 26.0–34.0)
MCHC: 31 g/dL (ref 30.0–36.0)
MCV: 89.5 fL (ref 78.0–100.0)
MONO ABS: 0.7 10*3/uL (ref 0.1–1.0)
MONOS PCT: 8 %
Neutro Abs: 7.5 10*3/uL (ref 1.7–7.7)
Neutrophils Relative %: 88 %
PLATELETS: 143 10*3/uL — AB (ref 150–400)
RBC: 4 MIL/uL (ref 3.87–5.11)
RDW: 14.5 % (ref 11.5–15.5)
WBC: 8.5 10*3/uL (ref 4.0–10.5)

## 2015-10-23 LAB — TROPONIN I: TROPONIN I: 0.03 ng/mL (ref <0.031)

## 2015-10-23 LAB — I-STAT CG4 LACTIC ACID, ED
LACTIC ACID, VENOUS: 2.33 mmol/L — AB (ref 0.5–2.0)
Lactic Acid, Venous: 1.4 mmol/L (ref 0.5–2.0)

## 2015-10-23 LAB — POC OCCULT BLOOD, ED: Fecal Occult Bld: POSITIVE — AB

## 2015-10-23 LAB — CK: CK TOTAL: 93 U/L (ref 38–234)

## 2015-10-23 MED ORDER — VANCOMYCIN HCL 500 MG IV SOLR
500.0000 mg | INTRAVENOUS | Status: DC
  Administered 2015-10-24: 500 mg via INTRAVENOUS
  Filled 2015-10-23 (×2): qty 500

## 2015-10-23 MED ORDER — NITROGLYCERIN 0.4 MG SL SUBL: 0.4000 mg | SUBLINGUAL_TABLET | SUBLINGUAL | Status: DC | PRN

## 2015-10-23 MED ORDER — PREGABALIN 50 MG PO CAPS
50.0000 mg | ORAL_CAPSULE | Freq: Three times a day (TID) | ORAL | Status: DC
  Administered 2015-10-23 – 2015-10-26 (×8): 50 mg via ORAL
  Filled 2015-10-23 (×8): qty 1

## 2015-10-23 MED ORDER — BISACODYL 10 MG RE SUPP: 10.0000 mg | Freq: Every day | RECTAL | Status: DC | PRN

## 2015-10-23 MED ORDER — SODIUM CHLORIDE 0.9 % IV SOLN
INTRAVENOUS | Status: DC
  Administered 2015-10-23: 22:00:00 via INTRAVENOUS
  Administered 2015-10-24: 40 mL/h via INTRAVENOUS
  Administered 2015-10-26: via INTRAVENOUS

## 2015-10-23 MED ORDER — FENTANYL 25 MCG/HR TD PT72
50.0000 ug | MEDICATED_PATCH | TRANSDERMAL | Status: DC
  Administered 2015-10-23: 50 ug via TRANSDERMAL
  Filled 2015-10-23: qty 1

## 2015-10-23 MED ORDER — SODIUM CHLORIDE 0.9 % IV BOLUS (SEPSIS): 500.0000 mL | INTRAVENOUS | Status: AC

## 2015-10-23 MED ORDER — FENTANYL 50 MCG/HR TD PT72: 50.0000 ug | MEDICATED_PATCH | TRANSDERMAL | Status: DC

## 2015-10-23 MED ORDER — DOCUSATE SODIUM 100 MG PO CAPS
100.0000 mg | ORAL_CAPSULE | Freq: Two times a day (BID) | ORAL | Status: DC
  Administered 2015-10-23 – 2015-10-25 (×5): 100 mg via ORAL
  Filled 2015-10-23 (×6): qty 1

## 2015-10-23 MED ORDER — POTASSIUM CHLORIDE 10 MEQ/100ML IV SOLN
10.0000 meq | Freq: Once | INTRAVENOUS | Status: AC
  Administered 2015-10-23: 10 meq via INTRAVENOUS
  Filled 2015-10-23: qty 100

## 2015-10-23 MED ORDER — TETANUS-DIPHTH-ACELL PERTUSSIS 5-2.5-18.5 LF-MCG/0.5 IM SUSP
0.5000 mL | Freq: Once | INTRAMUSCULAR | Status: AC
  Administered 2015-10-23: 0.5 mL via INTRAMUSCULAR
  Filled 2015-10-23: qty 0.5

## 2015-10-23 MED ORDER — POTASSIUM CHLORIDE CRYS ER 20 MEQ PO TBCR
20.0000 meq | EXTENDED_RELEASE_TABLET | Freq: Every day | ORAL | Status: DC
  Administered 2015-10-23 – 2015-10-25 (×3): 20 meq via ORAL
  Filled 2015-10-23 (×4): qty 1

## 2015-10-23 MED ORDER — HYDROCODONE-ACETAMINOPHEN 5-325 MG PO TABS
1.0000 | ORAL_TABLET | ORAL | Status: DC | PRN
  Administered 2015-10-24: 1 via ORAL
  Filled 2015-10-23: qty 1

## 2015-10-23 MED ORDER — ACETAMINOPHEN 325 MG PO TABS: 650.0000 mg | ORAL_TABLET | ORAL | Status: DC | PRN

## 2015-10-23 MED ORDER — IOPAMIDOL (ISOVUE-300) INJECTION 61%
INTRAVENOUS | Status: AC
Start: 2015-10-23 — End: 2015-10-23
  Administered 2015-10-23: 80 mL
  Filled 2015-10-23: qty 100

## 2015-10-23 MED ORDER — SENNA 8.6 MG PO TABS
2.0000 | ORAL_TABLET | Freq: Every day | ORAL | Status: DC
  Administered 2015-10-23 – 2015-10-25 (×3): 17.2 mg via ORAL
  Filled 2015-10-23 (×4): qty 2

## 2015-10-23 MED ORDER — FUROSEMIDE 20 MG PO TABS
20.0000 mg | ORAL_TABLET | Freq: Every day | ORAL | Status: DC
  Administered 2015-10-25 – 2015-10-26 (×2): 20 mg via ORAL
  Filled 2015-10-23 (×2): qty 1

## 2015-10-23 MED ORDER — FUROSEMIDE 20 MG PO TABS: 20.0000 mg | ORAL_TABLET | Freq: Every day | ORAL | Status: DC

## 2015-10-23 MED ORDER — LIDOCAINE-EPINEPHRINE (PF) 2 %-1:200000 IJ SOLN
10.0000 mL | Freq: Once | INTRAMUSCULAR | Status: AC
  Administered 2015-10-23: 10 mL via INTRADERMAL
  Filled 2015-10-23: qty 20

## 2015-10-23 MED ORDER — VENLAFAXINE HCL ER 75 MG PO CP24
75.0000 mg | ORAL_CAPSULE | Freq: Every day | ORAL | Status: DC
  Administered 2015-10-24 – 2015-10-25 (×2): 75 mg via ORAL
  Filled 2015-10-23 (×3): qty 1

## 2015-10-23 MED ORDER — PIPERACILLIN-TAZOBACTAM 3.375 G IVPB 30 MIN
3.3750 g | Freq: Once | INTRAVENOUS | Status: AC
  Administered 2015-10-23: 3.375 g via INTRAVENOUS
  Filled 2015-10-23: qty 50

## 2015-10-23 MED ORDER — SODIUM CHLORIDE 0.9 % IV SOLN
Freq: Once | INTRAVENOUS | Status: AC
  Administered 2015-10-23: 100 mL/h via INTRAVENOUS

## 2015-10-23 MED ORDER — ENOXAPARIN SODIUM 40 MG/0.4ML ~~LOC~~ SOLN
40.0000 mg | SUBCUTANEOUS | Status: DC
  Administered 2015-10-24 – 2015-10-25 (×2): 40 mg via SUBCUTANEOUS
  Filled 2015-10-23 (×2): qty 0.4

## 2015-10-23 MED ORDER — VANCOMYCIN HCL IN DEXTROSE 1-5 GM/200ML-% IV SOLN
1000.0000 mg | Freq: Once | INTRAVENOUS | Status: AC
  Administered 2015-10-23: 1000 mg via INTRAVENOUS
  Filled 2015-10-23: qty 200

## 2015-10-23 MED ORDER — PREGABALIN 50 MG PO CAPS: 50.0000 mg | ORAL_CAPSULE | Freq: Three times a day (TID) | ORAL | Status: DC

## 2015-10-23 MED ORDER — HYDROCODONE-ACETAMINOPHEN 5-325 MG PO TABS
2.0000 | ORAL_TABLET | ORAL | Status: DC | PRN
  Administered 2015-10-23 – 2015-10-24 (×3): 2 via ORAL
  Filled 2015-10-23 (×3): qty 2

## 2015-10-23 MED ORDER — MORPHINE SULFATE (PF) 2 MG/ML IV SOLN
2.0000 mg | INTRAVENOUS | Status: DC | PRN
  Administered 2015-10-23 – 2015-10-26 (×5): 2 mg via INTRAVENOUS
  Filled 2015-10-23 (×5): qty 1

## 2015-10-23 MED ORDER — POTASSIUM CHLORIDE CRYS ER 20 MEQ PO TBCR
20.0000 meq | EXTENDED_RELEASE_TABLET | Freq: Every day | ORAL | Status: DC
Start: 2015-10-23 — End: 2015-10-23

## 2015-10-23 MED ORDER — ONDANSETRON HCL 4 MG PO TABS: 4.0000 mg | ORAL_TABLET | Freq: Four times a day (QID) | ORAL | Status: DC | PRN

## 2015-10-23 MED ORDER — ONDANSETRON HCL 4 MG/2ML IJ SOLN
4.0000 mg | Freq: Four times a day (QID) | INTRAMUSCULAR | Status: DC | PRN
  Administered 2015-10-24: 4 mg via INTRAVENOUS
  Filled 2015-10-23: qty 2

## 2015-10-23 MED ORDER — FENTANYL CITRATE (PF) 100 MCG/2ML IJ SOLN
100.0000 ug | Freq: Once | INTRAMUSCULAR | Status: AC
  Administered 2015-10-23: 100 ug via INTRAVENOUS
  Filled 2015-10-23: qty 2

## 2015-10-23 MED ORDER — PIPERACILLIN-TAZOBACTAM 3.375 G IVPB
3.3750 g | Freq: Three times a day (TID) | INTRAVENOUS | Status: DC
Start: 2015-10-23 — End: 2015-10-25
  Administered 2015-10-23 – 2015-10-25 (×5): 3.375 g via INTRAVENOUS
  Filled 2015-10-23 (×8): qty 50

## 2015-10-23 MED ORDER — SODIUM CHLORIDE 0.9 % IV BOLUS (SEPSIS)
1000.0000 mL | Freq: Once | INTRAVENOUS | Status: AC
  Administered 2015-10-23: 1000 mL via INTRAVENOUS

## 2015-10-23 MED ORDER — METOPROLOL TARTRATE 12.5 MG HALF TABLET
12.5000 mg | ORAL_TABLET | Freq: Two times a day (BID) | ORAL | Status: DC
  Administered 2015-10-23 – 2015-10-25 (×4): 12.5 mg via ORAL
  Filled 2015-10-23 (×6): qty 1

## 2015-10-23 NOTE — ED Notes (Signed)
89 yof from [place] presents via EMS after a fall. Staff went to check on her this morning and found her on the floor in the bathroom with dried blood on face. Unknown downtime.  +lace to right eyebrow, contusions and swelling right periorbital region. Ambulatory on scene with ems.  Glucose 130.

## 2015-10-23 NOTE — H&P (Signed)
Julia [name] is an 82 y.o. female.   Chief Complaint: fall HPI: 58 yof who has history of a couple falls and uses walker at baseline presents after fall in bathroom sometime last night.  She was found in hall trying to reach her call bell she lives in independent living. She was checked on this am and found down  She has significant facial edema and complains of pain. She has multiple comorbidities including cad and spinal stenosis for which she is on duragesic patch and hydrocodone. She also has recent utis that has been treated for.    Past Medical History  Diagnosis Date  . SPINAL STENOSIS   . LOW BACK PAIN     chronic, follows with Nsurg for ESI  . FRACTURE, PELVIS, RIGHT 12/2009  . HIP FRACTURE, LEFT 09/2009    s/p L THA  . ALLERGIC RHINITIS   . Carpal tunnel syndrome   . Hypertension   . HYPERLIPIDEMIA   . GERD   . Arthritis   . Osteoporosis   . Atrial fibrillation (HCC)     no anticoag due to fall risk/age  . DEPRESSION   . CORONARY ARTERY DISEASE 2004    a.  LHC (01/2003):  3 v CAD => s/p CABG.;   b.  Myoview (02/2013):  Anterior scar; no ischemia; EF 49%.    . Systolic congestive heart failure (HCC)   . 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 Echo (02/2013):  EF 25-30%.;   b.  Echo (04/04/13):  Mild focal basal septal hypertrophy, EF 60-65%, no RWMA, Gr 2 DD, mild to mod MAC, mild bileaflet MVP, mild to mod MR, mild to mod LAE, mild RAE, PASP 48.  [name] H/O Doppler ultrasound     Renal Artery [place] (09/2010):  No RAS.  . Carotid artery occlusion     Carotid US (05/2010):  RICA 70-99%; LICA 0-49%  . Anemia     Past Surgical History  Procedure Laterality Date  . (l) hip hemiarthroplasty  09/2009  . Appendectomy  1940  . Eye surgery      both cataracts  . Coronary artery bypass graft  2004  . Carpal tunnel release  2004    right and left-dsc  . Ulnar nerve transposition  2009    lt   . Vein ligation      legs  . Colonoscopy    . I&d extremity Right 12/26/2012     Procedure: IRRIGATION AND DEBRIDEMENT OF RIGHT LEG, SURGICAL PREP PLACEMENT OF A CELL AND VAC   ;  Surgeon: Wayland Denis, DO;  Location: [facility];  Service: Plastics;  Laterality: Right;    Family History  Problem Relation Age of Onset  . Arthritis Mother   . Breast cancer Mother   . Heart disease Mother   . Hypertension Mother   . Arthritis Father   . Heart disease Father   . Hypertension Father   . Arthritis Maternal Grandmother   . Arthritis Maternal Grandfather   . Arthritis Paternal Grandmother   . Arthritis Paternal Grandfather   . Breast cancer Daughter   . Cancer Mother 37  . Emphysema Father 31  . Heart attack Brother 68   Social History:  reports that she has quit smoking. Her smoking use included Cigarettes. She has never used smokeless tobacco. She reports that she does not drink alcohol or use illicit drugs.  Allergies:  Allergies  Allergen Reactions  . Amoxicillin Nausea Only  Upset stomach with high dose  . Sporanox [Itraconazole] Hives  . Sulfa Antibiotics Nausea Only    Meds reviewed   Results for orders placed or performed during the hospital encounter of 10/14/2015 (from the past 48 hour(s))  POC occult blood, ED Provider will collect     Status: Abnormal   Collection Time: 11/01/2015  9:56 AM  Result Value Ref Range   Fecal Occult Bld POSITIVE (A) NEGATIVE  Comprehensive metabolic panel     Status: Abnormal   Collection Time: 11/03/2015 10:54 AM  Result Value Ref Range   Sodium 143 135 - 145 mmol/L   Potassium 3.4 (L) 3.5 - 5.1 mmol/L   Chloride 100 (L) 101 - 111 mmol/L   CO2 31 22 - 32 mmol/L   Glucose, Bld 116 (H) 65 - 99 mg/dL   BUN 26 (H) 6 - 20 mg/dL   Creatinine, Ser 8.90 0.44 - 1.00 mg/dL   Calcium 8.7 (L) 8.9 - 10.3 mg/dL   Total Protein 6.4 (L) 6.5 - 8.1 g/dL   Albumin 2.8 (L) 3.5 - 5.0 g/dL   AST 42 (H) 15 - 41 U/L   ALT 25 14 - 54 U/L   Alkaline Phosphatase 144 (H) 38 - 126 U/L   Total Bilirubin 0.8 0.3 - 1.2 mg/dL    GFR calc non Af Amer >60 >60 mL/min   GFR calc Af Amer >60 >60 mL/min    Comment: (NOTE) The eGFR has been calculated using the CKD EPI equation. This calculation has not been validated in all clinical situations. eGFR's persistently <60 mL/min signify possible Chronic Kidney Disease.    Anion gap 12 5 - 15  CBC with Differential/Platelet     Status: Abnormal   Collection Time: 10/16/2015 10:54 AM  Result Value Ref Range   WBC 8.5 4.0 - 10.5 K/uL    Comment: WHITE COUNT CONFIRMED ON SMEAR   RBC 4.00 3.87 - 5.11 MIL/uL   Hemoglobin 11.1 (L) 12.0 - 15.0 g/dL   HCT 91.2 (L) 71.0 - 41.4 %   MCV 89.5 78.0 - 100.0 fL   MCH 27.8 26.0 - 34.0 pg   MCHC 31.0 30.0 - 36.0 g/dL   RDW 12.0 55.8 - 79.7 %   Platelets 143 (L) 150 - 400 K/uL   Neutrophils Relative % 88 %   Lymphocytes Relative 4 %   Monocytes Relative 8 %   Eosinophils Relative 0 %   Basophils Relative 0 %   Neutro Abs 7.5 1.7 - 7.7 K/uL   Lymphs Abs 0.3 (L) 0.7 - 4.0 K/uL   Monocytes Absolute 0.7 0.1 - 1.0 K/uL   Eosinophils Absolute 0.0 0.0 - 0.7 K/uL   Basophils Absolute 0.0 0.0 - 0.1 K/uL   Smear Review MORPHOLOGY UNREMARKABLE   Troponin I     Status: None   Collection Time: 11/01/2015 10:54 AM  Result Value Ref Range   Troponin I 0.03 <0.031 ng/mL    Comment:        NO INDICATION OF MYOCARDIAL INJURY.   CK     Status: None   Collection Time: 11/05/2015 10:54 AM  Result Value Ref Range   Total CK 93 38 - 234 U/L  I-Stat CG4 Lactic Acid, ED     Status: Abnormal   Collection Time: 11/02/2015 11:35 AM  Result Value Ref Range   Lactic Acid, Venous 2.33 (HH) 0.5 - 2.0 mmol/L   Comment NOTIFIED PHYSICIAN   Urinalysis, Routine w reflex microscopic (not at [facility] ([facility]))  Status: Abnormal   Collection Time: 10/27/2015 11:45 AM  Result Value Ref Range   Color, Urine YELLOW YELLOW   APPearance CLEAR CLEAR   Specific Gravity, Urine 1.013 1.005 - 1.030   pH 6.0 5.0 - 8.0   Glucose, UA NEGATIVE NEGATIVE mg/dL   Hgb urine dipstick  NEGATIVE NEGATIVE   Bilirubin Urine NEGATIVE NEGATIVE   Ketones, ur 15 (A) NEGATIVE mg/dL   Protein, ur NEGATIVE NEGATIVE mg/dL   Nitrite NEGATIVE NEGATIVE   Leukocytes, UA NEGATIVE NEGATIVE    Comment: MICROSCOPIC NOT DONE ON URINES WITH NEGATIVE PROTEIN, BLOOD, LEUKOCYTES, NITRITE, OR GLUCOSE <1000 mg/dL.  I-Stat CG4 Lactic Acid, ED  (not at  [facility])     Status: None   Collection Time: 10/20/2015  2:51 PM  Result Value Ref Range   Lactic Acid, Venous 1.40 0.5 - 2.0 mmol/L   Dg Chest 1 View  10/10/2015  CLINICAL DATA:  82 year old female found down in the bathroom EXAM: CHEST 1 VIEW COMPARISON:  Prior chest x-ray 03/16/2015 FINDINGS: Stable borderline cardiomegaly. Patient is status post median sternotomy with evidence of multivessel CABG. Atherosclerotic calcifications again noted within the transverse aorta. Levoconvex and rotary lumbar scoliosis with the apex at L2-L3. No evidence of focal airspace consolidation, pulmonary edema, pleural effusion or pneumothorax. Persistent lingular atelectasis versus scarring, central airway thickening and mild interstitial prominence. IMPRESSION: 1. Stable chest x-ray without evidence of acute cardiopulmonary process. Electronically Signed   By: Jacqulynn Cadet M.D.   On: 11/03/2015 11:14   Dg Shoulder Right  11/03/2015  CLINICAL DATA:  Right shoulder pain and bruising following a fall on the bathroom floor. EXAM: RIGHT SHOULDER - 2+ VIEW COMPARISON:  None. FINDINGS: The positioning and is suboptimal, making it difficult to assess the relationship between the humeral head and glenoid. If there a clinical concern for dislocation, an axillary view would be recommended. No fracture is seen. IMPRESSION: No fracture or gross dislocation seen. If there is a clinical concern for dislocation, an axillary view would be recommended. Electronically Signed   By: Claudie Revering M.D.   On: 10/25/2015 11:17   Dg Elbow Complete Right  10/13/2015  CLINICAL DATA:  Right elbow  pain and laceration after falling on the bathroom floor. EXAM: RIGHT ELBOW - COMPLETE 3+ VIEW COMPARISON:  None. FINDINGS: Soft tissue irregularity laterally with overlying bandage material. No fracture, dislocation or effusion seen. Mild degenerative changes. IMPRESSION: 1. No fracture. 2. Mild degenerative changes. Electronically Signed   By: Claudie Revering M.D.   On: 10/14/2015 11:18   Ct Head Wo Contrast  11/04/2015  CLINICAL DATA:  82 year old female with history of trauma from a fall with severe bruising to the face, lips and head. EXAM: CT HEAD WITHOUT CONTRAST CT MAXILLOFACIAL WITHOUT CONTRAST CT CERVICAL SPINE WITHOUT CONTRAST TECHNIQUE: Multidetector CT imaging of the head, cervical spine, and maxillofacial structures were performed using the standard protocol without intravenous contrast. Multiplanar CT image reconstructions of the cervical spine and maxillofacial structures were also generated. COMPARISON:  No priors. FINDINGS: CT HEAD FINDINGS High attenuation fluid collection in the subcutaneous fat of the right infraorbital region, presumably a posttraumatic hematoma. Periorbital soft tissue swelling on the right side. Right-sided orbital floor fracture (discussed below), with high attenuation in the right maxillary sinus, compatible with hemo sinus. Right-sided proptosis. No other acute displaced skull fractures are identified. No acute intracranial abnormality. Specifically, no evidence of acute post-traumatic intracranial hemorrhage, no definite regions of acute/subacute cerebral ischemia, no focal mass, mass effect, hydrocephalus  or abnormal intra or extra-axial fluid collections. Mild cerebral atrophy. Patchy and confluent areas of decreased attenuation are noted throughout the deep and periventricular white matter of the cerebral hemispheres bilaterally, compatible with chronic microvascular ischemic disease. CT MAXILLOFACIAL FINDINGS In the subcutaneous fat anterior to the right maxillary  sinus there is a 13 x 24 mm high attenuation lesion, compatible with a posttraumatic hematoma. Adjacent soft tissue swelling is noted. Proptosis of the right lobe. Displaced right-sided orbital floor fracture with high attenuation material in the right maxillary sinus, compatible with hemo sinus. There is some inferior herniation of right-sided orbital fat, but no herniation of extraocular muscles is identified at this time. Pterygoid plates are intact. Mandible is intact, and the mandibular condyles are located bilaterally. Probable nondisplaced nasal bone fractures bilaterally. CT CERVICAL SPINE FINDINGS Acute fracture through the anterior and posterior arches of C1. The anterior arch of C1 is remarkably thin, likely secondary to chronic degenerative changes and pannus at the atlantoaxial articulation. There is approximately 7.5 mm of separation between the left and right sides of the anterior arch of C1. The fracture through the posterior arch of C1 is on the left side, and is approximately 1.3 mm displaced. No other acute displaced fracture of the cervical spine is noted. There is some dystrophic calcification in the soft tissues posterior to the lamina at C3, presumably related to remote trauma. 3 mm of anterolisthesis of C4 upon C5. Alignment is otherwise anatomic. Prevertebral soft tissues are normal. Severe multilevel degenerative disc disease throughout the cervical spine, most pronounced at C4-C5, C5-C6 and C6-C7. Multilevel facet arthropathy. Visualized portions of the upper thorax demonstrate emphysematous changes and some septal thickening. IMPRESSION: 1. Acute displaced fractures through the anterior and posterior arches of C1, as discussed above. 2. Displaced right orbital floor fracture. At this time there is some downward herniation of extraconal fat, but no entrapment of extraocular muscles. Large amount of hemosinus in the right maxillary sinus. 3. Right globe proptosis. 4. Large hematoma in the  subcutaneous fat in the right infraorbital region. 5. Nondisplaced nasal bone fractures bilaterally. 6. No other skull fractures or signs of significant acute intracranial trauma. 7. Mild cerebral atrophy with chronic microvascular ischemic changes in the cerebral white matter, as above. 8. Multilevel degenerative disc disease and cervical spondylosis, as above. These results were called by telephone at the time of interpretation on 10/18/2015 at 2:06 pm to Dr. Orlie Dakin, who verbally acknowledged these results. Electronically Signed   By: Vinnie Langton M.D.   On: 10/09/2015 14:08   Ct Cervical Spine Wo Contrast  10/25/2015  CLINICAL DATA:  82 year old female with history of trauma from a fall with severe bruising to the face, lips and head. EXAM: CT HEAD WITHOUT CONTRAST CT MAXILLOFACIAL WITHOUT CONTRAST CT CERVICAL SPINE WITHOUT CONTRAST TECHNIQUE: Multidetector CT imaging of the head, cervical spine, and maxillofacial structures were performed using the standard protocol without intravenous contrast. Multiplanar CT image reconstructions of the cervical spine and maxillofacial structures were also generated. COMPARISON:  No priors. FINDINGS: CT HEAD FINDINGS High attenuation fluid collection in the subcutaneous fat of the right infraorbital region, presumably a posttraumatic hematoma. Periorbital soft tissue swelling on the right side. Right-sided orbital floor fracture (discussed below), with high attenuation in the right maxillary sinus, compatible with hemo sinus. Right-sided proptosis. No other acute displaced skull fractures are identified. No acute intracranial abnormality. Specifically, no evidence of acute post-traumatic intracranial hemorrhage, no definite regions of acute/subacute cerebral ischemia, no focal mass, mass effect,  hydrocephalus or abnormal intra or extra-axial fluid collections. Mild cerebral atrophy. Patchy and confluent areas of decreased attenuation are noted throughout the deep  and periventricular white matter of the cerebral hemispheres bilaterally, compatible with chronic microvascular ischemic disease. CT MAXILLOFACIAL FINDINGS In the subcutaneous fat anterior to the right maxillary sinus there is a 13 x 24 mm high attenuation lesion, compatible with a posttraumatic hematoma. Adjacent soft tissue swelling is noted. Proptosis of the right lobe. Displaced right-sided orbital floor fracture with high attenuation material in the right maxillary sinus, compatible with hemo sinus. There is some inferior herniation of right-sided orbital fat, but no herniation of extraocular muscles is identified at this time. Pterygoid plates are intact. Mandible is intact, and the mandibular condyles are located bilaterally. Probable nondisplaced nasal bone fractures bilaterally. CT CERVICAL SPINE FINDINGS Acute fracture through the anterior and posterior arches of C1. The anterior arch of C1 is remarkably thin, likely secondary to chronic degenerative changes and pannus at the atlantoaxial articulation. There is approximately 7.5 mm of separation between the left and right sides of the anterior arch of C1. The fracture through the posterior arch of C1 is on the left side, and is approximately 1.3 mm displaced. No other acute displaced fracture of the cervical spine is noted. There is some dystrophic calcification in the soft tissues posterior to the lamina at C3, presumably related to remote trauma. 3 mm of anterolisthesis of C4 upon C5. Alignment is otherwise anatomic. Prevertebral soft tissues are normal. Severe multilevel degenerative disc disease throughout the cervical spine, most pronounced at C4-C5, C5-C6 and C6-C7. Multilevel facet arthropathy. Visualized portions of the upper thorax demonstrate emphysematous changes and some septal thickening. IMPRESSION: 1. Acute displaced fractures through the anterior and posterior arches of C1, as discussed above. 2. Displaced right orbital floor fracture. At  this time there is some downward herniation of extraconal fat, but no entrapment of extraocular muscles. Large amount of hemosinus in the right maxillary sinus. 3. Right globe proptosis. 4. Large hematoma in the subcutaneous fat in the right infraorbital region. 5. Nondisplaced nasal bone fractures bilaterally. 6. No other skull fractures or signs of significant acute intracranial trauma. 7. Mild cerebral atrophy with chronic microvascular ischemic changes in the cerebral white matter, as above. 8. Multilevel degenerative disc disease and cervical spondylosis, as above. These results were called by telephone at the time of interpretation on 11/04/2015 at 2:06 pm to Dr. Orlie Dakin, who verbally acknowledged these results. Electronically Signed   By: Vinnie Langton M.D.   On: 10/27/2015 14:08   Ct Abdomen Pelvis W Contrast  10/21/2015  ADDENDUM REPORT: 10/30/2015 15:02 ADDENDUM: Mentioned in the initial report in the body of the report, but not discussed in the conclusions were the morphologic changes in the liver suggestive of cirrhosis, and the new hypervascular lesion in segment 2 of the liver. While the lesion in segment 2 of the liver is favored to represent a cavernous hemangioma, consideration for nonemergent follow-up with MRI of the abdomen with and without IV gadolinium is recommended (if clinically appropriate) given the cirrhotic appearance of the liver, as an aggressive lesion such as a hepatocellular carcinoma is not excluded. Electronically Signed   By: Vinnie Langton M.D.   On: 10/22/2015 15:02  10/30/2015  CLINICAL DATA:  82 year old female with history of trauma from a fall. Bruising on the face, lips and head. EXAM: CT ABDOMEN AND PELVIS WITH CONTRAST TECHNIQUE: Multidetector CT imaging of the abdomen and pelvis was performed using the standard  protocol following bolus administration of intravenous contrast. CONTRAST:  23m ISOVUE-300 IOPAMIDOL (ISOVUE-300) INJECTION 61% COMPARISON:  CT  of the abdomen and pelvis 09/29/2009. FINDINGS: Lower chest: There is a dependent opacity in the posterior aspect of the left lower lobe which is somewhat nodular in appearance measuring 11 x 16 mm (image 4 of series 15). Additional linear opacities likely reflect areas of subsegmental atelectasis and/or scarring. Mild cardiomegaly. Mitral annular calcifications. Atherosclerotic calcifications in the left anterior descending and right coronary arteries. Hepatobiliary: No evidence of significant acute traumatic injury to the liver. 2.4 x 1.4 cm hypervascular lesion in segment 2 of the liver is incompletely characterized. Liver has a slightly shrunken appearance and nodular contour, suggesting underlying cirrhosis. No intra or extrahepatic biliary ductal dilatation. Gallbladder is moderately distended. 1.9 cm gallstone lying dependently in the gallbladder. No inflammatory changes noted adjacent to the gallbladder. Pancreas: No signs of acute traumatic injury to the pancreas. No pancreatic mass. No pancreatic ductal dilatation. No pancreatic or peripancreatic fluid or inflammatory changes. Spleen: No evidence of acute traumatic injury to the spleen. Unremarkable. Adrenals/Urinary Tract: No evidence of acute traumatic injury to either kidney or adrenal gland. 2 cm simple cyst in the posterior aspect of the upper pole of the right kidney. Left kidney is normal in appearance. No hydroureteronephrosis. Urinary bladder is normal in appearance. Stomach/Bowel: The enhanced appearance of the stomach is normal. No pathologic dilatation of small bowel or colon. No findings to suggest acute traumatic injury to the hollow viscera. Numerous colonic diverticulae are noted, without definite surrounding inflammatory changes to suggest an acute diverticulitis at this time. The appendix is not confidently identified may be surgically absent. Regardless, there are no inflammatory changes noted adjacent to the cecum to suggest the  presence of an acute appendicitis at this time. Vascular/Lymphatic: Atherosclerosis throughout the abdominal and pelvic vasculature, without definite aneurysm or dissection. No lymphadenopathy noted in the abdomen or pelvis on today's noncontrast CT examination. Reproductive: Uterus and ovaries are atrophic. Other: No high attenuation fluid collection in the peritoneal cavity or retroperitoneum to suggest significant posttraumatic hemorrhage. No significant volume of ascites. No pneumoperitoneum. Musculoskeletal: Status post left hip arthroplasty. Old healed fractures of the superior and inferior pubic rami bilaterally. In addition, there is a mildly displaced fracture of the posterior aspect of the right inferior pubic ramus which extends to the ischial spine, best appreciated on coronal images (image 74 of series 17) There are no aggressive appearing lytic or blastic lesions noted in the visualized portions of the skeleton. IMPRESSION: 1. Mildly displaced fracture through the posterior aspect of the right inferior pubic ramus extending to the right ischial spine. 2. No other definite findings of significant acute traumatic injury to the abdomen or pelvis. 3. 1.6 x 1.1 cm nodular density in the posterior aspect of the left lower lobe. This may simply represent some atelectasis and/or scarring, as there are other areas of apparent scarring in the lung bases bilaterally. However, attention on short-term chest CT is recommended in 2-3 months, as the possibility of a neoplasm is not excluded. 4. Cholelithiasis without evidence of acute cholecystitis at this time. 5. Extensive colonic diverticulosis without evidence of acute diverticulitis at this time. 6. Extensive atherosclerosis, including multivessel coronary artery disease. 7. Additional incidental findings, as above. These results were called by telephone at the time of interpretation on 10/14/2015 at 2:28 pm to Dr. SOrlie Dakin who verbally acknowledged these  results. Electronically Signed: By: DVinnie LangtonM.D. On: 10/24/2015 14:28  Ct Maxillofacial Wo Cm  10/30/2015  CLINICAL DATA:  82 year old female with history of trauma from a fall with severe bruising to the face, lips and head. EXAM: CT HEAD WITHOUT CONTRAST CT MAXILLOFACIAL WITHOUT CONTRAST CT CERVICAL SPINE WITHOUT CONTRAST TECHNIQUE: Multidetector CT imaging of the head, cervical spine, and maxillofacial structures were performed using the standard protocol without intravenous contrast. Multiplanar CT image reconstructions of the cervical spine and maxillofacial structures were also generated. COMPARISON:  No priors. FINDINGS: CT HEAD FINDINGS High attenuation fluid collection in the subcutaneous fat of the right infraorbital region, presumably a posttraumatic hematoma. Periorbital soft tissue swelling on the right side. Right-sided orbital floor fracture (discussed below), with high attenuation in the right maxillary sinus, compatible with hemo sinus. Right-sided proptosis. No other acute displaced skull fractures are identified. No acute intracranial abnormality. Specifically, no evidence of acute post-traumatic intracranial hemorrhage, no definite regions of acute/subacute cerebral ischemia, no focal mass, mass effect, hydrocephalus or abnormal intra or extra-axial fluid collections. Mild cerebral atrophy. Patchy and confluent areas of decreased attenuation are noted throughout the deep and periventricular white matter of the cerebral hemispheres bilaterally, compatible with chronic microvascular ischemic disease. CT MAXILLOFACIAL FINDINGS In the subcutaneous fat anterior to the right maxillary sinus there is a 13 x 24 mm high attenuation lesion, compatible with a posttraumatic hematoma. Adjacent soft tissue swelling is noted. Proptosis of the right lobe. Displaced right-sided orbital floor fracture with high attenuation material in the right maxillary sinus, compatible with hemo sinus. There is  some inferior herniation of right-sided orbital fat, but no herniation of extraocular muscles is identified at this time. Pterygoid plates are intact. Mandible is intact, and the mandibular condyles are located bilaterally. Probable nondisplaced nasal bone fractures bilaterally. CT CERVICAL SPINE FINDINGS Acute fracture through the anterior and posterior arches of C1. The anterior arch of C1 is remarkably thin, likely secondary to chronic degenerative changes and pannus at the atlantoaxial articulation. There is approximately 7.5 mm of separation between the left and right sides of the anterior arch of C1. The fracture through the posterior arch of C1 is on the left side, and is approximately 1.3 mm displaced. No other acute displaced fracture of the cervical spine is noted. There is some dystrophic calcification in the soft tissues posterior to the lamina at C3, presumably related to remote trauma. 3 mm of anterolisthesis of C4 upon C5. Alignment is otherwise anatomic. Prevertebral soft tissues are normal. Severe multilevel degenerative disc disease throughout the cervical spine, most pronounced at C4-C5, C5-C6 and C6-C7. Multilevel facet arthropathy. Visualized portions of the upper thorax demonstrate emphysematous changes and some septal thickening. IMPRESSION: 1. Acute displaced fractures through the anterior and posterior arches of C1, as discussed above. 2. Displaced right orbital floor fracture. At this time there is some downward herniation of extraconal fat, but no entrapment of extraocular muscles. Large amount of hemosinus in the right maxillary sinus. 3. Right globe proptosis. 4. Large hematoma in the subcutaneous fat in the right infraorbital region. 5. Nondisplaced nasal bone fractures bilaterally. 6. No other skull fractures or signs of significant acute intracranial trauma. 7. Mild cerebral atrophy with chronic microvascular ischemic changes in the cerebral white matter, as above. 8. Multilevel  degenerative disc disease and cervical spondylosis, as above. These results were called by telephone at the time of interpretation on 10/24/2015 at 2:06 pm to Dr. Orlie Dakin, who verbally acknowledged these results. Electronically Signed   By: Vinnie Langton M.D.   On: 11/01/2015 14:08  Review of Systems  Constitutional: Negative for fever and chills.  Respiratory: Negative for shortness of breath.   Cardiovascular: Negative for chest pain.  Gastrointestinal: Negative for abdominal pain.  Musculoskeletal: Positive for neck pain.    Blood pressure 134/64, pulse 92, temperature 97.9 F (36.6 C), temperature source Rectal, resp. rate 14, height '5\' 2"'$  (1.575 m), weight 49.896 kg (110 lb), SpO2 90 %. Physical Exam  Vitals reviewed. Constitutional: She is oriented to person, place, and time.  HENT:  Right Ear: External ear normal.  Left Ear: External ear normal.  Mouth/Throat: Oropharynx is clear and moist.  Right eye with significant edema surrounding and laceration closed by Dr Katy Fitch.  Significant right sided and edema of upper lip   Eyes:  Left eye intact, right examined by dr Katy Fitch see his note  Neck:  c collar in place  Cardiovascular: Normal rate, regular rhythm and normal heart sounds.   Respiratory: Effort normal and breath sounds normal.  GI: Soft. There is no tenderness.  Musculoskeletal: She exhibits tenderness (multiple new and old ecchymoses, no specific area that is tender right now).  Neurological: She is alert and oriented to person, place, and time.     Assessment/Plan Fall  c1 fracture- seen by Dr Annette Stable, collar for a couple months Proptosis/orbital fracture/laceration repaired by Dr Katy Fitch, nothing further needed, facial team will see for fractures Pelvic fracture should be nonoperative, will get up and around tomorrow with pt Pt consult in am Lovenox, scds for proph Palliative consult to discuss goals, I think she will do well with this but her independent  status will certainly change.   Rolm Bookbinder, MD 10/14/2015, 4:30 PM

## 2015-10-23 NOTE — ED Provider Notes (Addendum)
CSN: [id number]     Arrival date & time 2016/04/27  0900 History   First MD Initiated Contact with Patient 02017/10/19 [phone]     Chief Complaint  Patient presents with  . Head Injury     (Consider location/radiation/quality/duration/timing/severity/associated sxs/prior Treatment) HPI History is obtained from patient's daughter, from patient and from records coming patient. Patient lives in independent living she fell on the floor sometime during the night and was checked on by an agency this morning known as "[facility]" who found patient lying on the floor. Patient has no recall of the event. She complains of facial pain. Denies other complaint. She walks with a walker at baseline. No treatment prior to coming here. She was reportedly ambulatory at the scene. EMS checked CBG which was 130 and she sustained multiple lacerations and skin tears as result fall. Past Medical History  Diagnosis Date  . SPINAL STENOSIS   . LOW BACK PAIN     chronic, follows with Nsurg for ESI  . FRACTURE, PELVIS, RIGHT 12/2009  . HIP FRACTURE, LEFT 09/2009    s/p L THA  . ALLERGIC RHINITIS   . Carpal tunnel syndrome   . Hypertension   . HYPERLIPIDEMIA   . GERD   . Arthritis   . Osteoporosis   . Atrial fibrillation (HCC)     no anticoag due to fall risk/age  . DEPRESSION   . CORONARY ARTERY DISEASE 2004    a.  LHC (01/2003):  3 v CAD => s/p CABG.;   b.  Myoview (02/2013):  Anterior scar; no ischemia; EF 49%.    . Systolic congestive heart failure (HCC)   . 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 Echo (02/2013):  EF 25-30%.;   b.  Echo (04/04/13):  Mild focal basal septal hypertrophy, EF 60-65%, no RWMA, Gr 2 DD, mild to mod MAC, mild bileaflet MVP, mild to mod MR, mild to mod LAE, mild RAE, PASP 48.  [name]. H/O Doppler ultrasound     Renal Artery US (09/2010):  No RAS.  . Carotid artery occlusion     Carotid US (05/2010):  RICA 70-99%; LICA 0-49%  . Anemia    Past Surgical History  Procedure Laterality  Date  . (l) hip hemiarthroplasty  09/2009  . Appendectomy  1940  . Eye surgery      both cataracts  . Coronary artery bypass graft  2004  . Carpal tunnel release  2004    right and left-dsc  . Ulnar nerve transposition  2009    lt   . Vein ligation      legs  . Colonoscopy    . I&d extremity Right 12/26/2012    Procedure: IRRIGATION AND DEBRIDEMENT OF RIGHT LEG, SURGICAL PREP PLACEMENT OF A CELL AND VAC   ;  Surgeon: Wayland Denislaire Sanger, DO;  Location: [facility];  Service: Plastics;  Laterality: Right;   Family History  Problem Relation Age of Onset  . Arthritis Mother   . Breast cancer Mother   . Heart disease Mother   . Hypertension Mother   . Arthritis Father   . Heart disease Father   . Hypertension Father   . Arthritis Maternal Grandmother   . Arthritis Maternal Grandfather   . Arthritis Paternal Grandmother   . Arthritis Paternal Grandfather   . Breast cancer Daughter   . Cancer Mother [phone]  . Emphysema Father [phone]  . Heart attack Brother [phone]   Social History  Substance Use Topics  . Smoking status: Former Smoker    Types: Cigarettes  . Smokeless tobacco: Never Used  . Alcohol Use: No   OB History    Gravida Para Term Preterm AB TAB SAB Ectopic Multiple Living   0 0 0 0 0 0 0 0       Review of Systems  HENT: Positive for facial swelling.   Genitourinary:       Recently treated for urinary tract infection, 2 different courses of antibiotics  Musculoskeletal: Positive for gait problem.       Walks with walker  Skin: Positive for wound.  All other systems reviewed and are negative.     Allergies  Amoxicillin; Sporanox; and Sulfa antibiotics  Home Medications   Prior to Admission medications   Medication Sig Start Date End Date Taking? Authorizing Provider  ALPRAZolam (XANAX) 0.25 MG tablet Take 1 tablet (0.25 mg total) by mouth 2 (two) times daily as needed for anxiety. 03/16/15   Newt Lukes, MD  aspirin 81 MG tablet Take 81 mg by mouth at  bedtime.     Historical Provider, MD  Cholecalciferol (VITAMIN D PO) Take 500 Units by mouth daily.    Historical Provider, MD  ciprofloxacin (CIPRO) 250 MG tablet Take 1 tablet (250 mg total) by mouth 2 (two) times daily. 09/29/15   Pincus Sanes, MD  docusate sodium (DOK) 100 MG capsule Take 1 capsule (100 mg total) by mouth daily. 06/09/15   Pincus Sanes, MD  doxycycline (VIBRAMYCIN) 100 MG capsule Take 1 capsule (100 mg total) by mouth 2 (two) times daily. 08/27/15   Carollee Leitz, NP  fentaNYL (DURAGESIC) 25 MCG/HR patch Place 1 patch (25 mcg total) onto the skin every 3 (three) days. In addition to 50 mcg patch 10/06/15   Pincus Sanes, MD  fentaNYL (DURAGESIC) 50 MCG/HR Place 1 patch (50 mcg total) onto the skin every 3 (three) days. 10/06/15 10/03/16  Pincus Sanes, MD  furosemide (LASIX) 20 MG tablet TAKE 40 MG BY MOUTH DAILY 07/31/15   Pincus Sanes, MD  HYDROcodone-acetaminophen (NORCO/VICODIN) 5-325 MG tablet Take 2 tablets by mouth every 4 (four) hours as needed. 10/06/15   Pincus Sanes, MD  LYRICA 50 MG capsule TAKE ONE CAPSULE THREE TIMES DAILY 08/23/15   Corwin Levins, MD  metoprolol tartrate (LOPRESSOR) 25 MG tablet Take 0.5 tablets (12.5 mg total) by mouth 2 (two) times daily. 03/16/15   Newt Lukes, MD  Multiple Vitamin (MULTIVITAMIN WITH MINERALS) TABS Take 1 tablet by mouth daily.    Historical Provider, MD  nitrofurantoin, macrocrystal-monohydrate, (MACROBID) 100 MG capsule Take 1 capsule (100 mg total) by mouth 2 (two) times daily. 10/19/15   Pincus Sanes, MD  nitroGLYCERIN (NITROSTAT) 0.4 MG SL tablet Place 1 tablet (0.4 mg total) under the tongue every 5 (five) minutes as needed for chest pain (upt o 3 doses). 03/16/15   Newt Lukes, MD  Omega-3 Fatty Acids (FISH OIL) 1000 MG CAPS Take 1 capsule by mouth daily. 12/15/10   Historical Provider, MD  pantoprazole (PROTONIX) 40 MG tablet Take 1 tablet (40 mg total) by mouth daily. 03/16/15   Newt Lukes, MD  potassium  chloride SA (K-DUR,KLOR-CON) 20 MEQ tablet Take 1 tablet (20 mEq total) by mouth daily. 12/28/14   Newt Lukes, MD  pravastatin (PRAVACHOL) 40 MG tablet Take 1 tablet (40 mg total) by mouth every evening. 03/16/15   Newt Lukes,  MD  PROCTOSOL HC 2.5 % rectal cream APPLY RECTALLY TWICE DAILY 03/18/15   Myrlene Broker, MD  senna (SENOKOT) 8.6 MG TABS tablet Take 2 tablets (17.2 mg total) by mouth at bedtime. 05/05/15   Pincus Sanes, MD  venlafaxine XR (EFFEXOR-XR) 75 MG 24 hr capsule TAKE ONE CAPSULE EACH DAY WITH BREAKFAST 06/22/15   Pincus Sanes, MD  vitamin C (ASCORBIC ACID) 500 MG tablet Take 500 mg by mouth daily.    Historical Provider, MD  zolpidem (AMBIEN) 5 MG tablet TAKE ONE TABLET AT BEDTIME AS NEEDED FOR SLEEP 10/18/15   Pincus Sanes, MD   There were no vitals taken for this visit. Physical Exam  Constitutional: She is oriented to person, place, and time.  Frail chronically ill-appearing  HENT:  Right eye swollen shut there is a 3 cm superficial laceration at the right forehead. Ecchymotic and swollen and tender over right periorbital area upper lip is swollen and ecchymotic. There is a laceration mucosal surface of upper lip. No trismus.  Eyes: Conjunctivae are normal. Pupils are equal, round, and reactive to light.  Neck: Neck supple. No tracheal deviation present. No thyromegaly present.  Superficial laceration at right anterolateral medial neck. Trachea midline. No point tenderness  Cardiovascular: Normal rate and regular rhythm.   No murmur heard. Pulmonary/Chest: Effort normal and breath sounds normal.  Sternotomy scar. Tender anteriorly no crepitance  Abdominal: She exhibits no distension. There is tenderness.  Diffusely tender no ecchymosis  Genitourinary: Guaiac positive stool.  Rectum normal tone and brown stool. No gross blood  Musculoskeletal: She exhibits no edema or tenderness.  Right upper extremity with 2 skin tears at elbow. Shoulder is ecchymotic  and tender. Radial pulse 2+. Bilateral lower extremities with ecchymoses at shinss. All otherextremities neurovascular intact. Entire spine nontender. Pelvis stable nontender.  Neurological: She is alert and oriented to person, place, and time. Coordination normal.  Follows simple commands moves all extremities  Skin: Skin is warm and dry. No rash noted.  Psychiatric: She has a normal mood and affect.  Nursing note and vitals reviewed.   ED Course  Procedures (including critical care time) Labs Review Labs Reviewed  COMPREHENSIVE METABOLIC PANEL  CBC WITH DIFFERENTIAL/PLATELET  URINALYSIS, ROUTINE W REFLEX MICROSCOPIC (NOT AT [facility])  TROPONIN I  CK  POC OCCULT BLOOD, ED    Imaging Review No results found. I have personally reviewed and evaluated these images and lab results as part of my medical decision-making.   EKG Interpretation   Date/Time:  Saturday October 23 2015 10:04:04 EDT Ventricular Rate:  87 PR Interval:  239 QRS Duration: 90 QT Interval:  403 QTC Calculation: 485 R Axis:   59 Text Interpretation:  Age not entered, assumed to be  82 years old for  purpose of ECG interpretation Sinus rhythm Prolonged PR interval  Borderline prolonged QT interval Confirmed by Ethelda Chick  MD, Iza Preston [id number])  on 11/03/2015 11:03:44 AM     Nursing was unable to perform phlebotomy. I performed phlebotomy from right femoral vein. Timeout performed. Skin prepped with chlorhexidine used a 21-gauge needle and syringe easily drew 10 mL of heart blood. No resultant hematoma. Patient tolerated procedure well. Cervical collar placed on patient Bear hugger placed on patient for hypothermia  2:25 PM patient requested pain medicine for facial pain. She remains alert Glasgow Coma Score 15 Fentanyl IV ordered Results for orders placed or performed during the hospital encounter of 11/04/2015  Comprehensive metabolic panel  Result Value Ref Range  Sodium 143 135 - 145 mmol/L   Potassium 3.4 (L) 3.5  - 5.1 mmol/L   Chloride 100 (L) 101 - 111 mmol/L   CO2 31 22 - 32 mmol/L   Glucose, Bld 116 (H) 65 - 99 mg/dL   BUN 26 (H) 6 - 20 mg/dL   Creatinine, Ser 1.61 0.44 - 1.00 mg/dL   Calcium 8.7 (L) 8.9 - 10.3 mg/dL   Total Protein 6.4 (L) 6.5 - 8.1 g/dL   Albumin 2.8 (L) 3.5 - 5.0 g/dL   AST 42 (H) 15 - 41 U/L   ALT 25 14 - 54 U/L   Alkaline Phosphatase 144 (H) 38 - 126 U/L   Total Bilirubin 0.8 0.3 - 1.2 mg/dL   GFR calc non Af Amer >60 >60 mL/min   GFR calc Af Amer >60 >60 mL/min   Anion gap 12 5 - 15  CBC with Differential/Platelet  Result Value Ref Range   WBC 8.5 4.0 - 10.5 K/uL   RBC 4.00 3.87 - 5.11 MIL/uL   Hemoglobin 11.1 (L) 12.0 - 15.0 g/dL   HCT 09.6 (L) 04.5 - 40.9 %   MCV 89.5 78.0 - 100.0 fL   MCH 27.8 26.0 - 34.0 pg   MCHC 31.0 30.0 - 36.0 g/dL   RDW 81.1 91.4 - 78.2 %   Platelets 143 (L) 150 - 400 K/uL   Neutrophils Relative % 88 %   Lymphocytes Relative 4 %   Monocytes Relative 8 %   Eosinophils Relative 0 %   Basophils Relative 0 %   Neutro Abs 7.5 1.7 - 7.7 K/uL   Lymphs Abs 0.3 (L) 0.7 - 4.0 K/uL   Monocytes Absolute 0.7 0.1 - 1.0 K/uL   Eosinophils Absolute 0.0 0.0 - 0.7 K/uL   Basophils Absolute 0.0 0.0 - 0.1 K/uL   Smear Review MORPHOLOGY UNREMARKABLE   Urinalysis, Routine w reflex microscopic (not at [facility])  Result Value Ref Range   Color, Urine YELLOW YELLOW   APPearance CLEAR CLEAR   Specific Gravity, Urine 1.013 1.005 - 1.030   pH 6.0 5.0 - 8.0   Glucose, UA NEGATIVE NEGATIVE mg/dL   Hgb urine dipstick NEGATIVE NEGATIVE   Bilirubin Urine NEGATIVE NEGATIVE   Ketones, ur 15 (A) NEGATIVE mg/dL   Protein, ur NEGATIVE NEGATIVE mg/dL   Nitrite NEGATIVE NEGATIVE   Leukocytes, UA NEGATIVE NEGATIVE  Troponin I  Result Value Ref Range   Troponin I 0.03 <0.031 ng/mL  CK  Result Value Ref Range   Total CK 93 38 - 234 U/L  POC occult blood, ED Provider will collect  Result Value Ref Range   Fecal Occult Bld POSITIVE (A) NEGATIVE  I-Stat CG4  Lactic Acid, ED  Result Value Ref Range   Lactic Acid, Venous 2.33 (HH) 0.5 - 2.0 mmol/L   Comment NOTIFIED PHYSICIAN   I-Stat CG4 Lactic Acid, ED  (not at  [facility])  Result Value Ref Range   Lactic Acid, Venous 1.40 0.5 - 2.0 mmol/L   Dg Chest 1 View  05-Nov-2015  CLINICAL DATA:  82 year old female found down in the bathroom EXAM: CHEST 1 VIEW COMPARISON:  Prior chest x-ray 03/16/2015 FINDINGS: Stable borderline cardiomegaly. Patient is status post median sternotomy with evidence of multivessel CABG. Atherosclerotic calcifications again noted within the transverse aorta. Levoconvex and rotary lumbar scoliosis with the apex at L2-L3. No evidence of focal airspace consolidation, pulmonary edema, pleural effusion or pneumothorax. Persistent lingular atelectasis versus scarring, central airway thickening and mild interstitial prominence.  IMPRESSION: 1. Stable chest x-ray without evidence of acute cardiopulmonary process. Electronically Signed   By: Malachy Moan M.D.   On: 11/06/15 11:14   Dg Shoulder Right  November 06, 2015  CLINICAL DATA:  Right shoulder pain and bruising following a fall on the bathroom floor. EXAM: RIGHT SHOULDER - 2+ VIEW COMPARISON:  None. FINDINGS: The positioning and is suboptimal, making it difficult to assess the relationship between the humeral head and glenoid. If there a clinical concern for dislocation, an axillary view would be recommended. No fracture is seen. IMPRESSION: No fracture or gross dislocation seen. If there is a clinical concern for dislocation, an axillary view would be recommended. Electronically Signed   By: Beckie Salts M.D.   On: 2015/11/06 11:17   Dg Elbow Complete Right  06-Nov-2015  CLINICAL DATA:  Right elbow pain and laceration after falling on the bathroom floor. EXAM: RIGHT ELBOW - COMPLETE 3+ VIEW COMPARISON:  None. FINDINGS: Soft tissue irregularity laterally with overlying bandage material. No fracture, dislocation or effusion seen. Mild degenerative  changes. IMPRESSION: 1. No fracture. 2. Mild degenerative changes. Electronically Signed   By: Beckie Salts M.D.   On: 2015-11-06 11:18   Ct Head Wo Contrast  11-06-2015  CLINICAL DATA:  82 year old female with history of trauma from a fall with severe bruising to the face, lips and head. EXAM: CT HEAD WITHOUT CONTRAST CT MAXILLOFACIAL WITHOUT CONTRAST CT CERVICAL SPINE WITHOUT CONTRAST TECHNIQUE: Multidetector CT imaging of the head, cervical spine, and maxillofacial structures were performed using the standard protocol without intravenous contrast. Multiplanar CT image reconstructions of the cervical spine and maxillofacial structures were also generated. COMPARISON:  No priors. FINDINGS: CT HEAD FINDINGS High attenuation fluid collection in the subcutaneous fat of the right infraorbital region, presumably a posttraumatic hematoma. Periorbital soft tissue swelling on the right side. Right-sided orbital floor fracture (discussed below), with high attenuation in the right maxillary sinus, compatible with hemo sinus. Right-sided proptosis. No other acute displaced skull fractures are identified. No acute intracranial abnormality. Specifically, no evidence of acute post-traumatic intracranial hemorrhage, no definite regions of acute/subacute cerebral ischemia, no focal mass, mass effect, hydrocephalus or abnormal intra or extra-axial fluid collections. Mild cerebral atrophy. Patchy and confluent areas of decreased attenuation are noted throughout the deep and periventricular white matter of the cerebral hemispheres bilaterally, compatible with chronic microvascular ischemic disease. CT MAXILLOFACIAL FINDINGS In the subcutaneous fat anterior to the right maxillary sinus there is a 13 x 24 mm high attenuation lesion, compatible with a posttraumatic hematoma. Adjacent soft tissue swelling is noted. Proptosis of the right lobe. Displaced right-sided orbital floor fracture with high attenuation material in the right  maxillary sinus, compatible with hemo sinus. There is some inferior herniation of right-sided orbital fat, but no herniation of extraocular muscles is identified at this time. Pterygoid plates are intact. Mandible is intact, and the mandibular condyles are located bilaterally. Probable nondisplaced nasal bone fractures bilaterally. CT CERVICAL SPINE FINDINGS Acute fracture through the anterior and posterior arches of C1. The anterior arch of C1 is remarkably thin, likely secondary to chronic degenerative changes and pannus at the atlantoaxial articulation. There is approximately 7.5 mm of separation between the left and right sides of the anterior arch of C1. The fracture through the posterior arch of C1 is on the left side, and is approximately 1.3 mm displaced. No other acute displaced fracture of the cervical spine is noted. There is some dystrophic calcification in the soft tissues posterior to the lamina at C3,  presumably related to remote trauma. 3 mm of anterolisthesis of C4 upon C5. Alignment is otherwise anatomic. Prevertebral soft tissues are normal. Severe multilevel degenerative disc disease throughout the cervical spine, most pronounced at C4-C5, C5-C6 and C6-C7. Multilevel facet arthropathy. Visualized portions of the upper thorax demonstrate emphysematous changes and some septal thickening. IMPRESSION: 1. Acute displaced fractures through the anterior and posterior arches of C1, as discussed above. 2. Displaced right orbital floor fracture. At this time there is some downward herniation of extraconal fat, but no entrapment of extraocular muscles. Large amount of hemosinus in the right maxillary sinus. 3. Right globe proptosis. 4. Large hematoma in the subcutaneous fat in the right infraorbital region. 5. Nondisplaced nasal bone fractures bilaterally. 6. No other skull fractures or signs of significant acute intracranial trauma. 7. Mild cerebral atrophy with chronic microvascular ischemic changes in  the cerebral white matter, as above. 8. Multilevel degenerative disc disease and cervical spondylosis, as above. These results were called by telephone at the time of interpretation on 10/29/2015 at 2:06 pm to Dr. Doug Sou, who verbally acknowledged these results. Electronically Signed   By: Trudie Reed M.D.   On: 10/09/2015 14:08   Ct Cervical Spine Wo Contrast  10/25/2015  CLINICAL DATA:  82 year old female with history of trauma from a fall with severe bruising to the face, lips and head. EXAM: CT HEAD WITHOUT CONTRAST CT MAXILLOFACIAL WITHOUT CONTRAST CT CERVICAL SPINE WITHOUT CONTRAST TECHNIQUE: Multidetector CT imaging of the head, cervical spine, and maxillofacial structures were performed using the standard protocol without intravenous contrast. Multiplanar CT image reconstructions of the cervical spine and maxillofacial structures were also generated. COMPARISON:  No priors. FINDINGS: CT HEAD FINDINGS High attenuation fluid collection in the subcutaneous fat of the right infraorbital region, presumably a posttraumatic hematoma. Periorbital soft tissue swelling on the right side. Right-sided orbital floor fracture (discussed below), with high attenuation in the right maxillary sinus, compatible with hemo sinus. Right-sided proptosis. No other acute displaced skull fractures are identified. No acute intracranial abnormality. Specifically, no evidence of acute post-traumatic intracranial hemorrhage, no definite regions of acute/subacute cerebral ischemia, no focal mass, mass effect, hydrocephalus or abnormal intra or extra-axial fluid collections. Mild cerebral atrophy. Patchy and confluent areas of decreased attenuation are noted throughout the deep and periventricular white matter of the cerebral hemispheres bilaterally, compatible with chronic microvascular ischemic disease. CT MAXILLOFACIAL FINDINGS In the subcutaneous fat anterior to the right maxillary sinus there is a 13 x 24 mm high  attenuation lesion, compatible with a posttraumatic hematoma. Adjacent soft tissue swelling is noted. Proptosis of the right lobe. Displaced right-sided orbital floor fracture with high attenuation material in the right maxillary sinus, compatible with hemo sinus. There is some inferior herniation of right-sided orbital fat, but no herniation of extraocular muscles is identified at this time. Pterygoid plates are intact. Mandible is intact, and the mandibular condyles are located bilaterally. Probable nondisplaced nasal bone fractures bilaterally. CT CERVICAL SPINE FINDINGS Acute fracture through the anterior and posterior arches of C1. The anterior arch of C1 is remarkably thin, likely secondary to chronic degenerative changes and pannus at the atlantoaxial articulation. There is approximately 7.5 mm of separation between the left and right sides of the anterior arch of C1. The fracture through the posterior arch of C1 is on the left side, and is approximately 1.3 mm displaced. No other acute displaced fracture of the cervical spine is noted. There is some dystrophic calcification in the soft tissues posterior to the lamina at  C3, presumably related to remote trauma. 3 mm of anterolisthesis of C4 upon C5. Alignment is otherwise anatomic. Prevertebral soft tissues are normal. Severe multilevel degenerative disc disease throughout the cervical spine, most pronounced at C4-C5, C5-C6 and C6-C7. Multilevel facet arthropathy. Visualized portions of the upper thorax demonstrate emphysematous changes and some septal thickening. IMPRESSION: 1. Acute displaced fractures through the anterior and posterior arches of C1, as discussed above. 2. Displaced right orbital floor fracture. At this time there is some downward herniation of extraconal fat, but no entrapment of extraocular muscles. Large amount of hemosinus in the right maxillary sinus. 3. Right globe proptosis. 4. Large hematoma in the subcutaneous fat in the right  infraorbital region. 5. Nondisplaced nasal bone fractures bilaterally. 6. No other skull fractures or signs of significant acute intracranial trauma. 7. Mild cerebral atrophy with chronic microvascular ischemic changes in the cerebral white matter, as above. 8. Multilevel degenerative disc disease and cervical spondylosis, as above. These results were called by telephone at the time of interpretation on 11-14-15 at 2:06 pm to Dr. Doug Sou, who verbally acknowledged these results. Electronically Signed   By: Trudie Reed M.D.   On: 11/14/15 14:08   Ct Abdomen Pelvis W Contrast  11-14-2015  ADDENDUM REPORT: 11-14-2015 15:02 ADDENDUM: Mentioned in the initial report in the body of the report, but not discussed in the conclusions were the morphologic changes in the liver suggestive of cirrhosis, and the new hypervascular lesion in segment 2 of the liver. While the lesion in segment 2 of the liver is favored to represent a cavernous hemangioma, consideration for nonemergent follow-up with MRI of the abdomen with and without IV gadolinium is recommended (if clinically appropriate) given the cirrhotic appearance of the liver, as an aggressive lesion such as a hepatocellular carcinoma is not excluded. Electronically Signed   By: Trudie Reed M.D.   On: Nov 14, 2015 15:02  Nov 14, 2015  CLINICAL DATA:  82 year old female with history of trauma from a fall. Bruising on the face, lips and head. EXAM: CT ABDOMEN AND PELVIS WITH CONTRAST TECHNIQUE: Multidetector CT imaging of the abdomen and pelvis was performed using the standard protocol following bolus administration of intravenous contrast. CONTRAST:  80mL ISOVUE-300 IOPAMIDOL (ISOVUE-300) INJECTION 61% COMPARISON:  CT of the abdomen and pelvis 09/29/2009. FINDINGS: Lower chest: There is a dependent opacity in the posterior aspect of the left lower lobe which is somewhat nodular in appearance measuring 11 x 16 mm (image 4 of series 15). Additional linear  opacities likely reflect areas of subsegmental atelectasis and/or scarring. Mild cardiomegaly. Mitral annular calcifications. Atherosclerotic calcifications in the left anterior descending and right coronary arteries. Hepatobiliary: No evidence of significant acute traumatic injury to the liver. 2.4 x 1.4 cm hypervascular lesion in segment 2 of the liver is incompletely characterized. Liver has a slightly shrunken appearance and nodular contour, suggesting underlying cirrhosis. No intra or extrahepatic biliary ductal dilatation. Gallbladder is moderately distended. 1.9 cm gallstone lying dependently in the gallbladder. No inflammatory changes noted adjacent to the gallbladder. Pancreas: No signs of acute traumatic injury to the pancreas. No pancreatic mass. No pancreatic ductal dilatation. No pancreatic or peripancreatic fluid or inflammatory changes. Spleen: No evidence of acute traumatic injury to the spleen. Unremarkable. Adrenals/Urinary Tract: No evidence of acute traumatic injury to either kidney or adrenal gland. 2 cm simple cyst in the posterior aspect of the upper pole of the right kidney. Left kidney is normal in appearance. No hydroureteronephrosis. Urinary bladder is normal in appearance. Stomach/Bowel: The enhanced  appearance of the stomach is normal. No pathologic dilatation of small bowel or colon. No findings to suggest acute traumatic injury to the hollow viscera. Numerous colonic diverticulae are noted, without definite surrounding inflammatory changes to suggest an acute diverticulitis at this time. The appendix is not confidently identified may be surgically absent. Regardless, there are no inflammatory changes noted adjacent to the cecum to suggest the presence of an acute appendicitis at this time. Vascular/Lymphatic: Atherosclerosis throughout the abdominal and pelvic vasculature, without definite aneurysm or dissection. No lymphadenopathy noted in the abdomen or pelvis on today's noncontrast  CT examination. Reproductive: Uterus and ovaries are atrophic. Other: No high attenuation fluid collection in the peritoneal cavity or retroperitoneum to suggest significant posttraumatic hemorrhage. No significant volume of ascites. No pneumoperitoneum. Musculoskeletal: Status post left hip arthroplasty. Old healed fractures of the superior and inferior pubic rami bilaterally. In addition, there is a mildly displaced fracture of the posterior aspect of the right inferior pubic ramus which extends to the ischial spine, best appreciated on coronal images (image 74 of series 17) There are no aggressive appearing lytic or blastic lesions noted in the visualized portions of the skeleton. IMPRESSION: 1. Mildly displaced fracture through the posterior aspect of the right inferior pubic ramus extending to the right ischial spine. 2. No other definite findings of significant acute traumatic injury to the abdomen or pelvis. 3. 1.6 x 1.1 cm nodular density in the posterior aspect of the left lower lobe. This may simply represent some atelectasis and/or scarring, as there are other areas of apparent scarring in the lung bases bilaterally. However, attention on short-term chest CT is recommended in 2-3 months, as the possibility of a neoplasm is not excluded. 4. Cholelithiasis without evidence of acute cholecystitis at this time. 5. Extensive colonic diverticulosis without evidence of acute diverticulitis at this time. 6. Extensive atherosclerosis, including multivessel coronary artery disease. 7. Additional incidental findings, as above. These results were called by telephone at the time of interpretation on 10/20/2015 at 2:28 pm to Dr. Doug Sou, who verbally acknowledged these results. Electronically Signed: By: Trudie Reed M.D. On: 10/24/2015 14:28   Ct Maxillofacial Wo Cm  10/09/2015  CLINICAL DATA:  82 year old female with history of trauma from a fall with severe bruising to the face, lips and head. EXAM: CT  HEAD WITHOUT CONTRAST CT MAXILLOFACIAL WITHOUT CONTRAST CT CERVICAL SPINE WITHOUT CONTRAST TECHNIQUE: Multidetector CT imaging of the head, cervical spine, and maxillofacial structures were performed using the standard protocol without intravenous contrast. Multiplanar CT image reconstructions of the cervical spine and maxillofacial structures were also generated. COMPARISON:  No priors. FINDINGS: CT HEAD FINDINGS High attenuation fluid collection in the subcutaneous fat of the right infraorbital region, presumably a posttraumatic hematoma. Periorbital soft tissue swelling on the right side. Right-sided orbital floor fracture (discussed below), with high attenuation in the right maxillary sinus, compatible with hemo sinus. Right-sided proptosis. No other acute displaced skull fractures are identified. No acute intracranial abnormality. Specifically, no evidence of acute post-traumatic intracranial hemorrhage, no definite regions of acute/subacute cerebral ischemia, no focal mass, mass effect, hydrocephalus or abnormal intra or extra-axial fluid collections. Mild cerebral atrophy. Patchy and confluent areas of decreased attenuation are noted throughout the deep and periventricular white matter of the cerebral hemispheres bilaterally, compatible with chronic microvascular ischemic disease. CT MAXILLOFACIAL FINDINGS In the subcutaneous fat anterior to the right maxillary sinus there is a 13 x 24 mm high attenuation lesion, compatible with a posttraumatic hematoma. Adjacent soft tissue swelling is  noted. Proptosis of the right lobe. Displaced right-sided orbital floor fracture with high attenuation material in the right maxillary sinus, compatible with hemo sinus. There is some inferior herniation of right-sided orbital fat, but no herniation of extraocular muscles is identified at this time. Pterygoid plates are intact. Mandible is intact, and the mandibular condyles are located bilaterally. Probable nondisplaced nasal  bone fractures bilaterally. CT CERVICAL SPINE FINDINGS Acute fracture through the anterior and posterior arches of C1. The anterior arch of C1 is remarkably thin, likely secondary to chronic degenerative changes and pannus at the atlantoaxial articulation. There is approximately 7.5 mm of separation between the left and right sides of the anterior arch of C1. The fracture through the posterior arch of C1 is on the left side, and is approximately 1.3 mm displaced. No other acute displaced fracture of the cervical spine is noted. There is some dystrophic calcification in the soft tissues posterior to the lamina at C3, presumably related to remote trauma. 3 mm of anterolisthesis of C4 upon C5. Alignment is otherwise anatomic. Prevertebral soft tissues are normal. Severe multilevel degenerative disc disease throughout the cervical spine, most pronounced at C4-C5, C5-C6 and C6-C7. Multilevel facet arthropathy. Visualized portions of the upper thorax demonstrate emphysematous changes and some septal thickening. IMPRESSION: 1. Acute displaced fractures through the anterior and posterior arches of C1, as discussed above. 2. Displaced right orbital floor fracture. At this time there is some downward herniation of extraconal fat, but no entrapment of extraocular muscles. Large amount of hemosinus in the right maxillary sinus. 3. Right globe proptosis. 4. Large hematoma in the subcutaneous fat in the right infraorbital region. 5. Nondisplaced nasal bone fractures bilaterally. 6. No other skull fractures or signs of significant acute intracranial trauma. 7. Mild cerebral atrophy with chronic microvascular ischemic changes in the cerebral white matter, as above. 8. Multilevel degenerative disc disease and cervical spondylosis, as above. These results were called by telephone at the time of interpretation on 11/07/2015 at 2:06 pm to Dr. Doug Sou, who verbally acknowledged these results. Electronically Signed   By: Trudie Reed M.D.   On: 10/25/2015 14:08    MDM  00 5 PM Code sepsis called based on Sirs criteria of hypothermia and lactic acidosis. Source of infection unknown. Final diagnoses:  None   I consulted with Dr. Dione Booze from ophthalmology who came to evaluate the patient and feels that she be managed here from his viewpoint. I also consulted with Dr. Kelly Splinter from maxillofacial service who will see patient while in hospital. I also consulted with Dr. Dutch Quint from neurosurgery who recommends Hard c collar for 2 months. He will see patient while in the hospital. I also consulted with Dr. Neale Burly from palliative care service who will see patient while in the hospital. Dr. Dwain Sarna from trauma surgery was consulted and will see patient here and arrange for admission. Lacerations do not require repair Diagnoses #1 fall #2 orbital blowout fracture right side #3 C1 fracture #4 hypothermia #5 closed pelvic fracture #6 hypokalemia #7 multiple contusions #8 multiple lacerations CRITICAL CARE Performed by: Doug Sou Total critical care time: 60 minutes Critical care time was exclusive of separately billable procedures and treating other patients. Critical care was necessary to treat or prevent imminent or life-threatening deterioration. Critical care was time spent personally by me on the following activities: development of treatment plan with patient and/or surrogate as well as nursing, discussions with consultants, evaluation of patient's response to treatment, examination of patient, obtaining history from patient or  surrogate, ordering and performing treatments and interventions, ordering and review of laboratory studies, ordering and review of radiographic studies, pulse oximetry and re-evaluation of patient's condition.    Doug Sou, MD Nov 11, 2015 1506  Doug Sou, MD Nov 11, 2015 1511

## 2015-10-23 NOTE — ED Notes (Signed)
CODE SEPSIS ACTIVATED 

## 2015-10-23 NOTE — Progress Notes (Signed)
Pharmacy Antibiotic Note  Julia [name] is a 82 y.o. female admitted on 11/04/2015 with sepsis.  Pharmacy has been consulted for Zosyn and vancomycin dosing. Given one time doses of Zosyn and vancomycin in the ED. Hypothermic and WBC wnl. SCr stable, CrCl ~8730ml/min.  Plan: Start Zosyn 3.375 gm IV q8h (4 hour infusion) Start vancomycin 500mg  IV Q24 Monitor clinical picture, renal function, VT prn F/U C&S, abx deescalation / LOT   Height: 5\' 2"  (157.5 cm) Weight: 110 lb (49.896 kg) IBW/kg (Calculated) : 50.1  Temp (24hrs), Avg:96.1 F (35.6 C), Min:95.6 F (35.3 C), Max:96.5 F (35.8 C)   Recent Labs Lab 10/25/2015 1054 11/05/2015 1135  WBC 8.5  --   CREATININE 0.66  --   LATICACIDVEN  --  2.33*    Estimated Creatinine Clearance: 33.1 mL/min (by C-G formula based on Cr of 0.66).    Allergies  Allergen Reactions  . Amoxicillin Nausea Only    Upset stomach with high dose  . Sporanox [Itraconazole] Hives  . Sulfa Antibiotics Nausea Only    Antimicrobials this admission: Zosyn 4/15 >>  Vancomycin 4/15 >>   Dose adjustments this admission: n/a  Microbiology results: 4/15 BCx: sent 4/15 UCx: sent   Thank you for allowing pharmacy to be a part of this patient's care.  Enzo BiNathan Jamel Holzmann, PharmD, BCPS Clinical Pharmacist Pager [phone] 10/16/2015 12:06 PM

## 2015-10-23 NOTE — Consult Note (Signed)
Ophthalmology Initial Consult Note  [name],[name], 82 y.o. female Date of Service:  10/16/2015  Requesting physician: Julia Sou, MD  Information Obtained from: son Chief Complaint: right orbital fracture, laceration  HPI/Discussion:  Julia [name] is a 82 y.o. female who is s/p unwitnessed fall found down this AM with amnesia to the episode in which she sustained a large right orbital floor blowout fracture and upper lid/brow laceration. She has a POHx of advanced ARMD (macular degeneration) and has gotten numerous anti-VEGF agents in the past (Dr. Gwendalyn [name] at [facility]). Her right eye is not visible due to swelling, but she has no vision complaints when the lids are held open. She has no pain with eye movement.  Past Ocular Hx:  ARMD, pseudophakia OU Ocular Meds:  None Family ocular history: Noncontributory  Past Medical History  Diagnosis Date  . SPINAL STENOSIS   . LOW BACK PAIN     chronic, follows with Nsurg for ESI  . FRACTURE, PELVIS, RIGHT 12/2009  . HIP FRACTURE, LEFT 09/2009    s/p L THA  . ALLERGIC RHINITIS   . Carpal tunnel syndrome   . Hypertension   . HYPERLIPIDEMIA   . GERD   . Arthritis   . Osteoporosis   . Atrial fibrillation (HCC)     no anticoag due to fall risk/age  . DEPRESSION   . CORONARY ARTERY DISEASE 2004    a.  LHC (01/2003):  3 v CAD => s/p CABG.;   b.  Myoview (02/2013):  Anterior scar; no ischemia; EF 49%.    . Systolic congestive heart failure (HCC)   . 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 Echo (02/2013):  EF 25-30%.;   b.  Echo (04/04/13):  Mild focal basal septal hypertrophy, EF 60-65%, no RWMA, Gr 2 DD, mild to mod MAC, mild bileaflet MVP, mild to mod MR, mild to mod LAE, mild RAE, PASP 48.  [name] H/O Doppler ultrasound     Renal Artery [place] (09/2010):  No RAS.  . Carotid artery occlusion     Carotid US (05/2010):  RICA 70-99%; LICA 0-49%  . Anemia    Past Surgical History  Procedure Laterality Date  . (l) hip hemiarthroplasty   09/2009  . Appendectomy  1940  . Eye surgery      both cataracts  . Coronary artery bypass graft  2004  . Carpal tunnel release  2004    right and left-dsc  . Ulnar nerve transposition  2009    lt   . Vein ligation      legs  . Colonoscopy    . I&d extremity Right 12/26/2012    Procedure: IRRIGATION AND DEBRIDEMENT OF RIGHT LEG, SURGICAL PREP PLACEMENT OF A CELL AND VAC   ;  Surgeon: Julia Denis, DO;  Location: [facility];  Service: Plastics;  Laterality: Right;    Prior to Admission Meds:  (Not in a hospital admission)  Inpatient Meds: See med rec  Allergies  Allergen Reactions  . Amoxicillin Nausea Only    Upset stomach with high dose  . Sporanox [Itraconazole] Hives  . Sulfa Antibiotics Nausea Only   Social History  Substance Use Topics  . Smoking status: Former Smoker    Types: Cigarettes  . Smokeless tobacco: Never Used  . Alcohol Use: No   Family History  Problem Relation Age of Onset  . Arthritis Mother   . Breast cancer Mother   . Heart disease Mother   .  Hypertension Mother   . Arthritis Father   . Heart disease Father   . Hypertension Father   . Arthritis Maternal Grandmother   . Arthritis Maternal Grandfather   . Arthritis Paternal Grandmother   . Arthritis Paternal Grandfather   . Breast cancer Daughter   . Cancer Mother [id number]  . Emphysema Father [phone]  . Heart attack Brother 65    ROS: Other than ROS in the HPI, all other systems were negative.  Exam: Temp: 97.9 F (36.6 C) Pulse Rate: 92 BP: 134/64 mmHg Resp: 14 SpO2: 90 %  Visual Acuity:  New Julia [name]   OD CF 2 feet (at least)   OS CF 2 feet (at least)     OD OS  Confr Vis Fields Grossly full to fingers Grossly ful to fingers  EOM (Primary) -1 limitation in all gazes full  Lids/Lashes Upper lid laceration about 3 cm horizontal directly below brow line; 2+ ecchymosis and edema lower worse than upper Normal  Conjunctiva - Bulbar 2-3+ chemosis worse temporally White, quiet  Pupils   2.5 --> 2, sluggish, no rAPD 2.5 --> 2, sluggish, no rAPD  Cornea  Clear Clear  Anterior Chamber Formed, no hyphema Formed  Lens:  IOL centered IOL  IOP 21 19  Fundus - Dilated? YES   Optic Disc - C:D Ratio 0.3 0.3  Post Seg:  Retina                    Vessels Normal caliber Normal caliber                  Vitreous  Clear, no hemorrhage Clear                  Macula Geographic atrophy Geographic atrophy                  Periphery Normal Normal       Neuro:  Oriented to person, place, and time:  Yes Psychiatric:  Mood and Affect Appropriate:  Yes  Labs/imaging:   A/P:  82 y.o. female with right orbital floor blowout fracture and lid laceration 2/2 unwitnessed fall.  1) Right orbital floor blowout fracture - Large with fat protrusion but no radiographic or clinical evidence of entrapment. - Mild gaze limitation in all directions is c/w inflammation. - Defer to face/trauma team regarding repair. - Recommend cold compresses x20 minutes 3-4x/day for 2 days, then warm compresses thereafter.  2) Right upper lid laceration - S/p repair by me at bedside (see procedure note) - Recommend bacitracin TID.  3) Orbital contusion, right - As above  Pt given my card for f/u as needed. I am happy to see her in my clinic in 2 weeks if she wishes.  [address] [address] [place], [place] [address]  R Julia SharpScott Lititia Sen, MD 10/28/2015, 4:05 PM

## 2015-10-23 NOTE — ED Notes (Signed)
Julia ChickJacubowitz, MD & this Rn went to radiology to obtain labs, MD performed Femoral stick for labs, blood sent to main lab

## 2015-10-23 NOTE — Progress Notes (Signed)
Confirmed DNR status with daughter who has gold form.  Also confirmed with pt who still wishes to be Do Not Resuscitate. Called and spoke with Dr. Dwain SarnaWakefield.

## 2015-10-23 NOTE — Op Note (Signed)
PROCEDURE NOTE - OPHTHALMOLOGY  [name] 10/26/1918 82 year old female  Date of Surgery: 10/18/2015 Surgeon: Baldwin [place] Scott Aquan Kope, MD  Diagnosis: right upper lid 3 cm laceration Procedure: right upper lid 3 cm laceration repair  Background/Indications: Ms. [name] is a 82 year old woman s/p unwitnessed fall in which she sustained a large right orbital floor fracture and brow laceration. The laceration was immediately under (~0.5 cm below) browline and deep. There was concern due to the location and depth that it needed to be closed. The decision was therefore made to close the laceration at the bedside.  Anesthesia: Local Complications: None   Description: The eyelid was anesthetized with 5 mL lidocaine (2% with epinephrine) and then cleaned with sterile saline and Betadine. The laceration was then closed with about 8 sutures (5-0 Vicryl) in simple interrupted fashion. Care was taken to only close the superficial layers and skin in order to prevent cicatrization with lagophthalmos. Bacitracin ointment was then placed on the lesion.  Instructions: Pt okay for discharge from ophthalmology's perspective once deemed stable by ER/hospitalist/trauma teams. Pt will need to be discharged on bacitracin ointment TID for 1 week. Card given to family with contact information and instructions to f/u in 2 weeks in my clinic if they wish.   R Fabian SharpScott Darrick Greenlaw, MD [address] [place]  [address]  [place], [place] [address]

## 2015-10-24 ENCOUNTER — Encounter (HOSPITAL_COMMUNITY): Payer: Self-pay | Admitting: Plastic Surgery

## 2015-10-24 ENCOUNTER — Inpatient Hospital Stay (HOSPITAL_COMMUNITY): Payer: Medicare Other

## 2015-10-24 DIAGNOSIS — J9601 Acute respiratory failure with hypoxia: Secondary | ICD-10-CM

## 2015-10-24 DIAGNOSIS — I5031 Acute diastolic (congestive) heart failure: Secondary | ICD-10-CM

## 2015-10-24 DIAGNOSIS — R296 Repeated falls: Secondary | ICD-10-CM

## 2015-10-24 DIAGNOSIS — W19XXXA Unspecified fall, initial encounter: Secondary | ICD-10-CM

## 2015-10-24 DIAGNOSIS — Z515 Encounter for palliative care: Secondary | ICD-10-CM | POA: Insufficient documentation

## 2015-10-24 DIAGNOSIS — S0993XA Unspecified injury of face, initial encounter: Secondary | ICD-10-CM | POA: Diagnosis present

## 2015-10-24 LAB — BASIC METABOLIC PANEL
Anion gap: 8 (ref 5–15)
BUN: 19 mg/dL (ref 6–20)
CHLORIDE: 105 mmol/L (ref 101–111)
CO2: 31 mmol/L (ref 22–32)
Calcium: 8.3 mg/dL — ABNORMAL LOW (ref 8.9–10.3)
Creatinine, Ser: 0.72 mg/dL (ref 0.44–1.00)
GFR calc Af Amer: >60 mL/min (ref >60)
GFR calc non Af Amer: >60 mL/min (ref >60)
GLUCOSE: 90 mg/dL (ref 65–99)
POTASSIUM: 3.6 mmol/L (ref 3.5–5.1)
Sodium: 144 mmol/L (ref 135–145)

## 2015-10-24 LAB — CBC
HEMATOCRIT: 32.8 % — AB (ref 36.0–46.0)
HEMOGLOBIN: 9.9 g/dL — AB (ref 12.0–15.0)
MCH: 27 pg (ref 26.0–34.0)
MCHC: 30.2 g/dL (ref 30.0–36.0)
MCV: 89.6 fL (ref 78.0–100.0)
PLATELETS: 150 10*3/uL (ref 150–400)
RBC: 3.66 MIL/uL — ABNORMAL LOW (ref 3.87–5.11)
RDW: 14.9 % (ref 11.5–15.5)
WBC: 6.5 10*3/uL (ref 4.0–10.5)

## 2015-10-24 LAB — MRSA PCR SCREENING: MRSA by PCR: NEGATIVE

## 2015-10-24 LAB — URINE CULTURE: Culture: NO GROWTH

## 2015-10-24 MED ORDER — PANTOPRAZOLE SODIUM 40 MG PO TBEC
40.0000 mg | DELAYED_RELEASE_TABLET | Freq: Every day | ORAL | Status: DC
  Administered 2015-10-24 – 2015-10-25 (×2): 40 mg via ORAL
  Filled 2015-10-24 (×3): qty 1

## 2015-10-24 MED ORDER — SENNA 8.6 MG PO TABS: 2.0000 | ORAL_TABLET | Freq: Every day | ORAL | Status: DC

## 2015-10-24 MED ORDER — FUROSEMIDE 10 MG/ML IJ SOLN
20.0000 mg | Freq: Once | INTRAMUSCULAR | Status: AC
  Administered 2015-10-24: 20 mg via INTRAVENOUS
  Filled 2015-10-24: qty 2

## 2015-10-24 NOTE — Consult Note (Signed)
Reason for Consult:Facial trauma Referring Physician: Dr. Lawanna Kobus Julia Black is an 82 y.o. female.  HPI: The patient is a 82 yrs old wf here with two sons and their family.  She was at a long term care facility when she fell and was down for an unknown period of time.  She was brought to the ED and CT scans done.  She does not remember falling but remembers being down and calling for help.  She has multiple medical problems including a recent UTI but otherwise things are stable.  She sustained a right orbital floor fracture.  There is significant bruising and swelling on the face and shoulder.  The C collar is in place.  Her upper dentition is in place and the lower is being cleaned.  She denies eye pain but has facial pain.  She has macular degeneration in the right eye with known effected vision.  No entrapment.  Past Medical History  Diagnosis Date  . SPINAL STENOSIS   . LOW BACK PAIN     chronic, follows with Nsurg for ESI  . FRACTURE, PELVIS, RIGHT 12/2009  . HIP FRACTURE, LEFT 09/2009    s/p L THA  . ALLERGIC RHINITIS   . Carpal tunnel syndrome   . Hypertension   . HYPERLIPIDEMIA   . GERD   . Arthritis   . Osteoporosis   . Atrial fibrillation ([place])     no anticoag due to fall risk/age  . DEPRESSION   . CORONARY ARTERY DISEASE 2004    a.  LHC (01/2003):  3 v CAD => s/p CABG.;   b.  Myoview (02/2013):  Anterior scar; no ischemia; EF 49%.    . Systolic congestive heart failure ([place])   . Cardiomyopathy ([place])     probable [place] CM - a. Echo (02/2013):  EF 25-30%.;   b.  Echo (04/04/13):  Mild focal basal septal hypertrophy, EF 60-65%, no RWMA, Gr 2 DD, mild to mod MAC, mild bileaflet MVP, mild to mod MR, mild to mod LAE, mild RAE, PASP 48.  [name] H/O Doppler ultrasound     Renal Artery [place] (09/2010):  No RAS.  . Carotid artery occlusion     Carotid US (05/2010):  RICA 70-26%; LICA 3-78%  . Anemia     Past Surgical History  Procedure Laterality Date  . (l) hip hemiarthroplasty   09/2009  . Appendectomy  1940  . Eye surgery      both cataracts  . Coronary artery bypass graft  2004  . Carpal tunnel release  2004    right and left-dsc  . Ulnar nerve transposition  2009    lt   . Vein ligation      legs  . Colonoscopy    . I&d extremity Right 12/26/2012    Procedure: IRRIGATION AND DEBRIDEMENT OF RIGHT LEG, SURGICAL PREP PLACEMENT OF A CELL AND VAC   ;  Surgeon: Theodoro Kos, DO;  Location: [facility];  Service: Plastics;  Laterality: Right;    Family History  Problem Relation Age of Onset  . Arthritis Mother   . Breast cancer Mother   . Heart disease Mother   . Hypertension Mother   . Arthritis Father   . Heart disease Father   . Hypertension Father   . Arthritis Maternal Grandmother   . Arthritis Maternal Grandfather   . Arthritis Paternal Grandmother   . Arthritis Paternal Grandfather   . Breast cancer Daughter   . Cancer  Mother 40  . Emphysema Father 62  . Heart attack Brother 52    Social History:  reports that she has quit smoking. Her smoking use included Cigarettes. She has never used smokeless tobacco. She reports that she does not drink alcohol or use illicit drugs.  Allergies:  Allergies  Allergen Reactions  . Amoxicillin Nausea Only    Upset stomach with high dose  . Sporanox [Itraconazole] Hives  . Sulfa Antibiotics Nausea Only    Medications: I have reviewed the patient's current medications.  Results for orders placed or performed during the hospital encounter of 10/12/2015 (from the past 48 hour(s))  POC occult blood, ED Provider will collect     Status: Abnormal   Collection Time: 10/27/2015  9:56 AM  Result Value Ref Range   Fecal Occult Bld POSITIVE (A) NEGATIVE  Comprehensive metabolic panel     Status: Abnormal   Collection Time: 10/22/2015 10:54 AM  Result Value Ref Range   Sodium 143 135 - 145 mmol/L   Potassium 3.4 (L) 3.5 - 5.1 mmol/L   Chloride 100 (L) 101 - 111 mmol/L   CO2 31 22 - 32 mmol/L    Glucose, Bld 116 (H) 65 - 99 mg/dL   BUN 26 (H) 6 - 20 mg/dL   Creatinine, Ser 9.91 0.44 - 1.00 mg/dL   Calcium 8.7 (L) 8.9 - 10.3 mg/dL   Total Protein 6.4 (L) 6.5 - 8.1 g/dL   Albumin 2.8 (L) 3.5 - 5.0 g/dL   AST 42 (H) 15 - 41 U/L   ALT 25 14 - 54 U/L   Alkaline Phosphatase 144 (H) 38 - 126 U/L   Total Bilirubin 0.8 0.3 - 1.2 mg/dL   GFR calc non Af Amer >60 >60 mL/min   GFR calc Af Amer >60 >60 mL/min    Comment: (NOTE) The eGFR has been calculated using the CKD EPI equation. This calculation has not been validated in all clinical situations. eGFR's persistently <60 mL/min signify possible Chronic Kidney Disease.    Anion gap 12 5 - 15  CBC with Differential/Platelet     Status: Abnormal   Collection Time: 10/16/2015 10:54 AM  Result Value Ref Range   WBC 8.5 4.0 - 10.5 K/uL    Comment: WHITE COUNT CONFIRMED ON SMEAR   RBC 4.00 3.87 - 5.11 MIL/uL   Hemoglobin 11.1 (L) 12.0 - 15.0 g/dL   HCT 44.4 (L) 58.4 - 83.5 %   MCV 89.5 78.0 - 100.0 fL   MCH 27.8 26.0 - 34.0 pg   MCHC 31.0 30.0 - 36.0 g/dL   RDW 07.5 73.2 - 25.6 %   Platelets 143 (L) 150 - 400 K/uL   Neutrophils Relative % 88 %   Lymphocytes Relative 4 %   Monocytes Relative 8 %   Eosinophils Relative 0 %   Basophils Relative 0 %   Neutro Abs 7.5 1.7 - 7.7 K/uL   Lymphs Abs 0.3 (L) 0.7 - 4.0 K/uL   Monocytes Absolute 0.7 0.1 - 1.0 K/uL   Eosinophils Absolute 0.0 0.0 - 0.7 K/uL   Basophils Absolute 0.0 0.0 - 0.1 K/uL   Smear Review MORPHOLOGY UNREMARKABLE   Troponin I     Status: None   Collection Time: 11/04/2015 10:54 AM  Result Value Ref Range   Troponin I 0.03 <0.031 ng/mL    Comment:        NO INDICATION OF MYOCARDIAL INJURY.   CK     Status: None   Collection  Time: 10/27/2015 10:54 AM  Result Value Ref Range   Total CK 93 38 - 234 U/L  I-Stat CG4 Lactic Acid, ED     Status: Abnormal   Collection Time: 10/18/2015 11:35 AM  Result Value Ref Range   Lactic Acid, Venous 2.33 (HH) 0.5 - 2.0 mmol/L   Comment  NOTIFIED PHYSICIAN   Urinalysis, Routine w reflex microscopic (not at [facility])     Status: Abnormal   Collection Time: 11/04/2015 11:45 AM  Result Value Ref Range   Color, Urine YELLOW YELLOW   APPearance CLEAR CLEAR   Specific Gravity, Urine 1.013 1.005 - 1.030   pH 6.0 5.0 - 8.0   Glucose, UA NEGATIVE NEGATIVE mg/dL   Hgb urine dipstick NEGATIVE NEGATIVE   Bilirubin Urine NEGATIVE NEGATIVE   Ketones, ur 15 (A) NEGATIVE mg/dL   Protein, ur NEGATIVE NEGATIVE mg/dL   Nitrite NEGATIVE NEGATIVE   Leukocytes, UA NEGATIVE NEGATIVE    Comment: MICROSCOPIC NOT DONE ON URINES WITH NEGATIVE PROTEIN, BLOOD, LEUKOCYTES, NITRITE, OR GLUCOSE <1000 mg/dL.  I-Stat CG4 Lactic Acid, ED  (not at  [facility])     Status: None   Collection Time: 11/01/2015  2:51 PM  Result Value Ref Range   Lactic Acid, Venous 1.40 0.5 - 2.0 mmol/L  CBC     Status: Abnormal   Collection Time: 10/24/15  3:53 AM  Result Value Ref Range   WBC 6.5 4.0 - 10.5 K/uL   RBC 3.66 (L) 3.87 - 5.11 MIL/uL   Hemoglobin 9.9 (L) 12.0 - 15.0 g/dL   HCT 32.8 (L) 36.0 - 46.0 %   MCV 89.6 78.0 - 100.0 fL   MCH 27.0 26.0 - 34.0 pg   MCHC 30.2 30.0 - 36.0 g/dL   RDW 14.9 11.5 - 15.5 %   Platelets 150 150 - 400 K/uL  Basic metabolic panel     Status: Abnormal   Collection Time: 10/24/15  3:53 AM  Result Value Ref Range   Sodium 144 135 - 145 mmol/L   Potassium 3.6 3.5 - 5.1 mmol/L   Chloride 105 101 - 111 mmol/L   CO2 31 22 - 32 mmol/L   Glucose, Bld 90 65 - 99 mg/dL   BUN 19 6 - 20 mg/dL   Creatinine, Ser 0.72 0.44 - 1.00 mg/dL   Calcium 8.3 (L) 8.9 - 10.3 mg/dL   GFR calc non Af Amer >60 >60 mL/min   GFR calc Af Amer >60 >60 mL/min    Comment: (NOTE) The eGFR has been calculated using the CKD EPI equation. This calculation has not been validated in all clinical situations. eGFR's persistently <60 mL/min signify possible Chronic Kidney Disease.    Anion gap 8 5 - 15    Dg Chest 1 View  10/29/2015  CLINICAL DATA:  82 year old female  found down in the bathroom EXAM: CHEST 1 VIEW COMPARISON:  Prior chest x-ray 03/16/2015 FINDINGS: Stable borderline cardiomegaly. Patient is status post median sternotomy with evidence of multivessel CABG. Atherosclerotic calcifications again noted within the transverse aorta. Levoconvex and rotary lumbar scoliosis with the apex at L2-L3. No evidence of focal airspace consolidation, pulmonary edema, pleural effusion or pneumothorax. Persistent lingular atelectasis versus scarring, central airway thickening and mild interstitial prominence. IMPRESSION: 1. Stable chest x-ray without evidence of acute cardiopulmonary process. Electronically Signed   By: Jacqulynn Cadet M.D.   On: 10/24/2015 11:14   Dg Shoulder Right  11/05/2015  CLINICAL DATA:  Right shoulder pain and bruising following a fall on  the bathroom floor. EXAM: RIGHT SHOULDER - 2+ VIEW COMPARISON:  None. FINDINGS: The positioning and is suboptimal, making it difficult to assess the relationship between the humeral head and glenoid. If there a clinical concern for dislocation, an axillary view would be recommended. No fracture is seen. IMPRESSION: No fracture or gross dislocation seen. If there is a clinical concern for dislocation, an axillary view would be recommended. Electronically Signed   By: Claudie Revering M.D.   On: 11/03/2015 11:17   Dg Elbow Complete Right  10/24/2015  CLINICAL DATA:  Right elbow pain and laceration after falling on the bathroom floor. EXAM: RIGHT ELBOW - COMPLETE 3+ VIEW COMPARISON:  None. FINDINGS: Soft tissue irregularity laterally with overlying bandage material. No fracture, dislocation or effusion seen. Mild degenerative changes. IMPRESSION: 1. No fracture. 2. Mild degenerative changes. Electronically Signed   By: Claudie Revering M.D.   On: 10/24/2015 11:18   Ct Head Wo Contrast  10/21/2015  CLINICAL DATA:  82 year old female with history of trauma from a fall with severe bruising to the face, lips and head. EXAM: CT  HEAD WITHOUT CONTRAST CT MAXILLOFACIAL WITHOUT CONTRAST CT CERVICAL SPINE WITHOUT CONTRAST TECHNIQUE: Multidetector CT imaging of the head, cervical spine, and maxillofacial structures were performed using the standard protocol without intravenous contrast. Multiplanar CT image reconstructions of the cervical spine and maxillofacial structures were also generated. COMPARISON:  No priors. FINDINGS: CT HEAD FINDINGS High attenuation fluid collection in the subcutaneous fat of the right infraorbital region, presumably a posttraumatic hematoma. Periorbital soft tissue swelling on the right side. Right-sided orbital floor fracture (discussed below), with high attenuation in the right maxillary sinus, compatible with hemo sinus. Right-sided proptosis. No other acute displaced skull fractures are identified. No acute intracranial abnormality. Specifically, no evidence of acute post-traumatic intracranial hemorrhage, no definite regions of acute/subacute cerebral ischemia, no focal mass, mass effect, hydrocephalus or abnormal intra or extra-axial fluid collections. Mild cerebral atrophy. Patchy and confluent areas of decreased attenuation are noted throughout the deep and periventricular white matter of the cerebral hemispheres bilaterally, compatible with chronic microvascular ischemic disease. CT MAXILLOFACIAL FINDINGS In the subcutaneous fat anterior to the right maxillary sinus there is a 13 x 24 mm high attenuation lesion, compatible with a posttraumatic hematoma. Adjacent soft tissue swelling is noted. Proptosis of the right lobe. Displaced right-sided orbital floor fracture with high attenuation material in the right maxillary sinus, compatible with hemo sinus. There is some inferior herniation of right-sided orbital fat, but no herniation of extraocular muscles is identified at this time. Pterygoid plates are intact. Mandible is intact, and the mandibular condyles are located bilaterally. Probable nondisplaced nasal  bone fractures bilaterally. CT CERVICAL SPINE FINDINGS Acute fracture through the anterior and posterior arches of C1. The anterior arch of C1 is remarkably thin, likely secondary to chronic degenerative changes and pannus at the atlantoaxial articulation. There is approximately 7.5 mm of separation between the left and right sides of the anterior arch of C1. The fracture through the posterior arch of C1 is on the left side, and is approximately 1.3 mm displaced. No other acute displaced fracture of the cervical spine is noted. There is some dystrophic calcification in the soft tissues posterior to the lamina at C3, presumably related to remote trauma. 3 mm of anterolisthesis of C4 upon C5. Alignment is otherwise anatomic. Prevertebral soft tissues are normal. Severe multilevel degenerative disc disease throughout the cervical spine, most pronounced at C4-C5, C5-C6 and C6-C7. Multilevel facet arthropathy. Visualized portions of the  upper thorax demonstrate emphysematous changes and some septal thickening. IMPRESSION: 1. Acute displaced fractures through the anterior and posterior arches of C1, as discussed above. 2. Displaced right orbital floor fracture. At this time there is some downward herniation of extraconal fat, but no entrapment of extraocular muscles. Large amount of hemosinus in the right maxillary sinus. 3. Right globe proptosis. 4. Large hematoma in the subcutaneous fat in the right infraorbital region. 5. Nondisplaced nasal bone fractures bilaterally. 6. No other skull fractures or signs of significant acute intracranial trauma. 7. Mild cerebral atrophy with chronic microvascular ischemic changes in the cerebral white matter, as above. 8. Multilevel degenerative disc disease and cervical spondylosis, as above. These results were called by telephone at the time of interpretation on 11/01/2015 at 2:06 pm to Dr. Orlie Dakin, who verbally acknowledged these results. Electronically Signed   By: Vinnie Langton M.D.   On: 10/30/2015 14:08   Ct Cervical Spine Wo Contrast  10/29/2015  CLINICAL DATA:  82 year old female with history of trauma from a fall with severe bruising to the face, lips and head. EXAM: CT HEAD WITHOUT CONTRAST CT MAXILLOFACIAL WITHOUT CONTRAST CT CERVICAL SPINE WITHOUT CONTRAST TECHNIQUE: Multidetector CT imaging of the head, cervical spine, and maxillofacial structures were performed using the standard protocol without intravenous contrast. Multiplanar CT image reconstructions of the cervical spine and maxillofacial structures were also generated. COMPARISON:  No priors. FINDINGS: CT HEAD FINDINGS High attenuation fluid collection in the subcutaneous fat of the right infraorbital region, presumably a posttraumatic hematoma. Periorbital soft tissue swelling on the right side. Right-sided orbital floor fracture (discussed below), with high attenuation in the right maxillary sinus, compatible with hemo sinus. Right-sided proptosis. No other acute displaced skull fractures are identified. No acute intracranial abnormality. Specifically, no evidence of acute post-traumatic intracranial hemorrhage, no definite regions of acute/subacute cerebral ischemia, no focal mass, mass effect, hydrocephalus or abnormal intra or extra-axial fluid collections. Mild cerebral atrophy. Patchy and confluent areas of decreased attenuation are noted throughout the deep and periventricular white matter of the cerebral hemispheres bilaterally, compatible with chronic microvascular ischemic disease. CT MAXILLOFACIAL FINDINGS In the subcutaneous fat anterior to the right maxillary sinus there is a 13 x 24 mm high attenuation lesion, compatible with a posttraumatic hematoma. Adjacent soft tissue swelling is noted. Proptosis of the right lobe. Displaced right-sided orbital floor fracture with high attenuation material in the right maxillary sinus, compatible with hemo sinus. There is some inferior herniation of  right-sided orbital fat, but no herniation of extraocular muscles is identified at this time. Pterygoid plates are intact. Mandible is intact, and the mandibular condyles are located bilaterally. Probable nondisplaced nasal bone fractures bilaterally. CT CERVICAL SPINE FINDINGS Acute fracture through the anterior and posterior arches of C1. The anterior arch of C1 is remarkably thin, likely secondary to chronic degenerative changes and pannus at the atlantoaxial articulation. There is approximately 7.5 mm of separation between the left and right sides of the anterior arch of C1. The fracture through the posterior arch of C1 is on the left side, and is approximately 1.3 mm displaced. No other acute displaced fracture of the cervical spine is noted. There is some dystrophic calcification in the soft tissues posterior to the lamina at C3, presumably related to remote trauma. 3 mm of anterolisthesis of C4 upon C5. Alignment is otherwise anatomic. Prevertebral soft tissues are normal. Severe multilevel degenerative disc disease throughout the cervical spine, most pronounced at C4-C5, C5-C6 and C6-C7. Multilevel facet arthropathy. Visualized portions of  the upper thorax demonstrate emphysematous changes and some septal thickening. IMPRESSION: 1. Acute displaced fractures through the anterior and posterior arches of C1, as discussed above. 2. Displaced right orbital floor fracture. At this time there is some downward herniation of extraconal fat, but no entrapment of extraocular muscles. Large amount of hemosinus in the right maxillary sinus. 3. Right globe proptosis. 4. Large hematoma in the subcutaneous fat in the right infraorbital region. 5. Nondisplaced nasal bone fractures bilaterally. 6. No other skull fractures or signs of significant acute intracranial trauma. 7. Mild cerebral atrophy with chronic microvascular ischemic changes in the cerebral white matter, as above. 8. Multilevel degenerative disc disease and  cervical spondylosis, as above. These results were called by telephone at the time of interpretation on 10/29/2015 at 2:06 pm to Dr. Orlie Dakin, who verbally acknowledged these results. Electronically Signed   By: Vinnie Langton M.D.   On: 10/25/2015 14:08   Ct Abdomen Pelvis W Contrast  10/22/2015  ADDENDUM REPORT: 11/06/2015 15:02 ADDENDUM: Mentioned in the initial report in the body of the report, but not discussed in the conclusions were the morphologic changes in the liver suggestive of cirrhosis, and the new hypervascular lesion in segment 2 of the liver. While the lesion in segment 2 of the liver is favored to represent a cavernous hemangioma, consideration for nonemergent follow-up with MRI of the abdomen with and without IV gadolinium is recommended (if clinically appropriate) given the cirrhotic appearance of the liver, as an aggressive lesion such as a hepatocellular carcinoma is not excluded. Electronically Signed   By: Vinnie Langton M.D.   On: 10/31/2015 15:02  10/27/2015  CLINICAL DATA:  82 year old female with history of trauma from a fall. Bruising on the face, lips and head. EXAM: CT ABDOMEN AND PELVIS WITH CONTRAST TECHNIQUE: Multidetector CT imaging of the abdomen and pelvis was performed using the standard protocol following bolus administration of intravenous contrast. CONTRAST:  11m ISOVUE-300 IOPAMIDOL (ISOVUE-300) INJECTION 61% COMPARISON:  CT of the abdomen and pelvis 09/29/2009. FINDINGS: Lower chest: There is a dependent opacity in the posterior aspect of the left lower lobe which is somewhat nodular in appearance measuring 11 x 16 mm (image 4 of series 15). Additional linear opacities likely reflect areas of subsegmental atelectasis and/or scarring. Mild cardiomegaly. Mitral annular calcifications. Atherosclerotic calcifications in the left anterior descending and right coronary arteries. Hepatobiliary: No evidence of significant acute traumatic injury to the liver. 2.4 x 1.4  cm hypervascular lesion in segment 2 of the liver is incompletely characterized. Liver has a slightly shrunken appearance and nodular contour, suggesting underlying cirrhosis. No intra or extrahepatic biliary ductal dilatation. Gallbladder is moderately distended. 1.9 cm gallstone lying dependently in the gallbladder. No inflammatory changes noted adjacent to the gallbladder. Pancreas: No signs of acute traumatic injury to the pancreas. No pancreatic mass. No pancreatic ductal dilatation. No pancreatic or peripancreatic fluid or inflammatory changes. Spleen: No evidence of acute traumatic injury to the spleen. Unremarkable. Adrenals/Urinary Tract: No evidence of acute traumatic injury to either kidney or adrenal gland. 2 cm simple cyst in the posterior aspect of the upper pole of the right kidney. Left kidney is normal in appearance. No hydroureteronephrosis. Urinary bladder is normal in appearance. Stomach/Bowel: The enhanced appearance of the stomach is normal. No pathologic dilatation of small bowel or colon. No findings to suggest acute traumatic injury to the hollow viscera. Numerous colonic diverticulae are noted, without definite surrounding inflammatory changes to suggest an acute diverticulitis at this time. The appendix  is not confidently identified may be surgically absent. Regardless, there are no inflammatory changes noted adjacent to the cecum to suggest the presence of an acute appendicitis at this time. Vascular/Lymphatic: Atherosclerosis throughout the abdominal and pelvic vasculature, without definite aneurysm or dissection. No lymphadenopathy noted in the abdomen or pelvis on today's noncontrast CT examination. Reproductive: Uterus and ovaries are atrophic. Other: No high attenuation fluid collection in the peritoneal cavity or retroperitoneum to suggest significant posttraumatic hemorrhage. No significant volume of ascites. No pneumoperitoneum. Musculoskeletal: Status post left hip arthroplasty.  Old healed fractures of the superior and inferior pubic rami bilaterally. In addition, there is a mildly displaced fracture of the posterior aspect of the right inferior pubic ramus which extends to the ischial spine, best appreciated on coronal images (image 74 of series 17) There are no aggressive appearing lytic or blastic lesions noted in the visualized portions of the skeleton. IMPRESSION: 1. Mildly displaced fracture through the posterior aspect of the right inferior pubic ramus extending to the right ischial spine. 2. No other definite findings of significant acute traumatic injury to the abdomen or pelvis. 3. 1.6 x 1.1 cm nodular density in the posterior aspect of the left lower lobe. This may simply represent some atelectasis and/or scarring, as there are other areas of apparent scarring in the lung bases bilaterally. However, attention on short-term chest CT is recommended in 2-3 months, as the possibility of a neoplasm is not excluded. 4. Cholelithiasis without evidence of acute cholecystitis at this time. 5. Extensive colonic diverticulosis without evidence of acute diverticulitis at this time. 6. Extensive atherosclerosis, including multivessel coronary artery disease. 7. Additional incidental findings, as above. These results were called by telephone at the time of interpretation on 11/01/2015 at 2:28 pm to Dr. Orlie Dakin, who verbally acknowledged these results. Electronically Signed: By: Vinnie Langton M.D. On: 10/14/2015 14:28   Ct Maxillofacial Wo Cm  10/14/2015  CLINICAL DATA:  82 year old female with history of trauma from a fall with severe bruising to the face, lips and head. EXAM: CT HEAD WITHOUT CONTRAST CT MAXILLOFACIAL WITHOUT CONTRAST CT CERVICAL SPINE WITHOUT CONTRAST TECHNIQUE: Multidetector CT imaging of the head, cervical spine, and maxillofacial structures were performed using the standard protocol without intravenous contrast. Multiplanar CT image reconstructions of the  cervical spine and maxillofacial structures were also generated. COMPARISON:  No priors. FINDINGS: CT HEAD FINDINGS High attenuation fluid collection in the subcutaneous fat of the right infraorbital region, presumably a posttraumatic hematoma. Periorbital soft tissue swelling on the right side. Right-sided orbital floor fracture (discussed below), with high attenuation in the right maxillary sinus, compatible with hemo sinus. Right-sided proptosis. No other acute displaced skull fractures are identified. No acute intracranial abnormality. Specifically, no evidence of acute post-traumatic intracranial hemorrhage, no definite regions of acute/subacute cerebral ischemia, no focal mass, mass effect, hydrocephalus or abnormal intra or extra-axial fluid collections. Mild cerebral atrophy. Patchy and confluent areas of decreased attenuation are noted throughout the deep and periventricular white matter of the cerebral hemispheres bilaterally, compatible with chronic microvascular ischemic disease. CT MAXILLOFACIAL FINDINGS In the subcutaneous fat anterior to the right maxillary sinus there is a 13 x 24 mm high attenuation lesion, compatible with a posttraumatic hematoma. Adjacent soft tissue swelling is noted. Proptosis of the right lobe. Displaced right-sided orbital floor fracture with high attenuation material in the right maxillary sinus, compatible with hemo sinus. There is some inferior herniation of right-sided orbital fat, but no herniation of extraocular muscles is identified at this time. Pterygoid  plates are intact. Mandible is intact, and the mandibular condyles are located bilaterally. Probable nondisplaced nasal bone fractures bilaterally. CT CERVICAL SPINE FINDINGS Acute fracture through the anterior and posterior arches of C1. The anterior arch of C1 is remarkably thin, likely secondary to chronic degenerative changes and pannus at the atlantoaxial articulation. There is approximately 7.5 mm of separation  between the left and right sides of the anterior arch of C1. The fracture through the posterior arch of C1 is on the left side, and is approximately 1.3 mm displaced. No other acute displaced fracture of the cervical spine is noted. There is some dystrophic calcification in the soft tissues posterior to the lamina at C3, presumably related to remote trauma. 3 mm of anterolisthesis of C4 upon C5. Alignment is otherwise anatomic. Prevertebral soft tissues are normal. Severe multilevel degenerative disc disease throughout the cervical spine, most pronounced at C4-C5, C5-C6 and C6-C7. Multilevel facet arthropathy. Visualized portions of the upper thorax demonstrate emphysematous changes and some septal thickening. IMPRESSION: 1. Acute displaced fractures through the anterior and posterior arches of C1, as discussed above. 2. Displaced right orbital floor fracture. At this time there is some downward herniation of extraconal fat, but no entrapment of extraocular muscles. Large amount of hemosinus in the right maxillary sinus. 3. Right globe proptosis. 4. Large hematoma in the subcutaneous fat in the right infraorbital region. 5. Nondisplaced nasal bone fractures bilaterally. 6. No other skull fractures or signs of significant acute intracranial trauma. 7. Mild cerebral atrophy with chronic microvascular ischemic changes in the cerebral white matter, as above. 8. Multilevel degenerative disc disease and cervical spondylosis, as above. These results were called by telephone at the time of interpretation on 10/20/2015 at 2:06 pm to Dr. Orlie Dakin, who verbally acknowledged these results. Electronically Signed   By: Vinnie Langton M.D.   On: 11/06/2015 14:08    Review of Systems  Constitutional: Negative.   HENT: Negative.   Eyes: Negative.   Respiratory: Negative.   Cardiovascular: Negative.   Gastrointestinal: Negative.   Genitourinary:       Recent UTI  Musculoskeletal: Negative.   Skin: Negative.    Psychiatric/Behavioral: Negative.    Blood pressure 107/53, pulse 86, temperature 97.7 F (36.5 C), temperature source Axillary, resp. rate 16, height '5\' 2"'$  (1.575 m), weight 49.896 kg (110 lb), SpO2 85 %. Physical Exam  Constitutional: She appears well-developed.  HENT:  Head: Normocephalic.  Eyes: EOM are normal. Pupils are equal, round, and reactive to light.  Cardiovascular: Normal rate.   Respiratory: Effort normal. No respiratory distress.  GI: Soft. She exhibits no distension.  Neurological: She is alert.  Skin: Skin is warm.  Psychiatric: She has a normal mood and affect. Her behavior is normal. Judgment and thought content normal.    Assessment/Plan: Instructed not to blow her nose.  Drink cold drinks and can use ice on the area.  Head of bed elevation is helpful.  Surgery not mandatory.  Family will think things over.  Can be discharge and follow up in next 1 -2 weeks at my office.  Julia Black 10/24/2015, 11:00 AM

## 2015-10-24 NOTE — Consult Note (Signed)
Name: Julia Black MRN: [id number] DOB: 09/10/1918    ADMISSION DATE:  10/28/2015 CONSULTATION DATE:  4/16  REFERRING MD : Trauma  CHIEF COMPLAINT: Hypoxia  BRIEF PATIENT DESCRIPTION: 82 yo with facial trauma from fall  SIGNIFICANT EVENTS  4/15 fall  STUDIES:     HISTORY OF PRESENT ILLNESS:   82 yo wf with a history of falls, uses walker who fell 4/15 while in route to bathroom. Found on right side with bloody face. [facility] CT face reveals blow out fx rt orbit, she has generalized facial ecchymosis , mildly stertorous respirations with poor O2 movent via nasal canals. CT neck revealed c 1 non displaced fx treated with collar. She has extensive cardiac hx and is [facility]. 4/16 O2 sats were noted to be low and PCCM was consulted. WE arrived in room, changed her from nasal cannula to oral O2 with sats promptly increased to 96% on 5 l/m flow. PCCM will follow in SDU, pulmonary toilet as tolerated but coughing should be minimal with blow out orbital fx. Question why she is on Vanc/Zoysn  PAST MEDICAL HISTORY :   has a past medical history of SPINAL STENOSIS; LOW BACK PAIN; FRACTURE, PELVIS, RIGHT (12/2009); HIP FRACTURE, LEFT (09/2009); ALLERGIC RHINITIS; Carpal tunnel syndrome; Hypertension; HYPERLIPIDEMIA; GERD; Arthritis; Osteoporosis; Atrial fibrillation (HCC); DEPRESSION; CORONARY ARTERY DISEASE (2004); Systolic congestive heart failure (HCC); Cardiomyopathy (HCC); H/O Doppler ultrasound; Carotid artery occlusion; and Anemia.  has past surgical history that includes (L) hip hemiarthroplasty (09/2009); Appendectomy (1940); Eye surgery; Coronary artery bypass graft (2004); Carpal tunnel release (2004); Ulnar nerve transposition (2009); Vein ligation; Colonoscopy; and I&D extremity (Right, 12/26/2012). Prior to Admission medications   Medication Sig Start Date End Date Taking? Authorizing Provider  ALPRAZolam (XANAX) 0.25 MG tablet Take 1 tablet (0.25 mg  total) by mouth 2 (two) times daily as needed for anxiety. 03/16/15  Yes Newt LukesValerie A Leschber, MD  aspirin 81 MG tablet Take 81 mg by mouth at bedtime.    Yes Historical Provider, MD  Cholecalciferol (VITAMIN D PO) Take 500 Units by mouth daily.   Yes Historical Provider, MD  docusate sodium (DOK) 100 MG capsule Take 1 capsule (100 mg total) by mouth daily. 06/09/15  Yes Pincus SanesStacy J Burns, MD  fentaNYL (DURAGESIC) 50 MCG/HR Place 1 patch (50 mcg total) onto the skin every 3 (three) days. 10/06/15 10/03/16 Yes Pincus SanesStacy J Burns, MD  furosemide (LASIX) 20 MG tablet TAKE 40 MG BY MOUTH DAILY 07/31/15  Yes Pincus SanesStacy J Burns, MD  HYDROcodone-acetaminophen (NORCO/VICODIN) 5-325 MG tablet Take 2 tablets by mouth every 4 (four) hours as needed. 10/06/15  Yes Pincus SanesStacy J Burns, MD  LYRICA 50 MG capsule TAKE ONE CAPSULE THREE TIMES DAILY 08/23/15  Yes Corwin LevinsJames W John, MD  metoprolol tartrate (LOPRESSOR) 25 MG tablet Take 0.5 tablets (12.5 mg total) by mouth 2 (two) times daily. 03/16/15  Yes Newt LukesValerie A Leschber, MD  Multiple Vitamin (MULTIVITAMIN WITH MINERALS) TABS Take 1 tablet by mouth daily.   Yes Historical Provider, MD  nitrofurantoin, macrocrystal-monohydrate, (MACROBID) 100 MG capsule Take 1 capsule (100 mg total) by mouth 2 (two) times daily. 10/19/15  Yes Pincus SanesStacy J Burns, MD  nitroGLYCERIN (NITROSTAT) 0.4 MG SL tablet Place 1 tablet (0.4 mg total) under the tongue every 5 (five) minutes as needed for chest pain (upt o 3 doses). 03/16/15  Yes Newt LukesValerie A Leschber, MD  pantoprazole (PROTONIX) 40 MG tablet Take 1 tablet (40 mg total) by mouth daily.  03/16/15  Yes Newt Lukes, MD  potassium chloride SA (K-DUR,KLOR-CON) 20 MEQ tablet Take 1 tablet (20 mEq total) by mouth daily. 12/28/14  Yes Newt Lukes, MD  pravastatin (PRAVACHOL) 40 MG tablet Take 1 tablet (40 mg total) by mouth every evening. 03/16/15  Yes Newt Lukes, MD  senna (SENOKOT) 8.6 MG TABS tablet Take 2 tablets (17.2 mg total) by mouth at bedtime. 05/05/15  Yes  Pincus Sanes, MD  venlafaxine XR (EFFEXOR-XR) 75 MG 24 hr capsule TAKE ONE CAPSULE EACH DAY WITH BREAKFAST 06/22/15  Yes Pincus Sanes, MD  vitamin C (ASCORBIC ACID) 500 MG tablet Take 500 mg by mouth daily.   Yes Historical Provider, MD  zolpidem (AMBIEN) 5 MG tablet TAKE ONE TABLET AT BEDTIME AS NEEDED FOR SLEEP 10/18/15  Yes Pincus Sanes, MD   Allergies  Allergen Reactions  . Amoxicillin Nausea Only    Upset stomach with high dose  . Sporanox [Itraconazole] Hives  . Sulfa Antibiotics Nausea Only    FAMILY HISTORY:  family history includes Arthritis in her father, maternal grandfather, maternal grandmother, mother, paternal grandfather, and paternal grandmother; Breast cancer in her daughter and mother; Cancer (age of onset: 78) in her mother; Emphysema (age of onset: 30) in her father; Heart attack (age of onset: 30) in her brother; Heart disease in her father and mother; Hypertension in her father and mother. SOCIAL HISTORY:  reports that she has quit smoking. Her smoking use included Cigarettes. She has never used smokeless tobacco. She reports that she does not drink alcohol or use illicit drugs.  REVIEW OF SYSTEMS:   10 point review of system taken, please see HPI for positives and negatives.   SUBJECTIVE:   VITAL SIGNS: Temp:  [94.1 F (34.5 C)-99.5 F (37.5 C)] 97.7 F (36.5 C) (04/16 1049) Pulse Rate:  [70-111] 86 (04/16 1049) Resp:  [14-24] 16 (04/16 1049) BP: (107-165)/(53-88) 107/53 mmHg (04/16 1049) SpO2:  [85 %-100 %] 85 % (04/16 1049)  PHYSICAL EXAMINATION: General:  Facial fxs and ecchymosis noted Neuro:  Intact follows command HEENT:  Bloody oral cavity, right eye with scleral hemophage, pupil reactive. No jvd Cardiovascular:  HSIR Lungs: decrease air movement Abdomen: +bs Musculoskeletal:  Intact Skin:  Warm and dry   Recent Labs Lab 10/10/2015 1054 10/24/15 0353  NA 143 144  K 3.4* 3.6  CL 100* 105  CO2 31 31  BUN 26* 19  CREATININE 0.66 0.72    GLUCOSE 116* 90    Recent Labs Lab 10/21/2015 1054 10/24/15 0353  HGB 11.1* 9.9*  HCT 35.8* 32.8*  WBC 8.5 6.5  PLT 143* 150   Dg Chest 1 View  10/10/2015  CLINICAL DATA:  82 year old female found down in the bathroom EXAM: CHEST 1 VIEW COMPARISON:  Prior chest x-ray 03/16/2015 FINDINGS: Stable borderline cardiomegaly. Patient is status post median sternotomy with evidence of multivessel CABG. Atherosclerotic calcifications again noted within the transverse aorta. Levoconvex and rotary lumbar scoliosis with the apex at L2-L3. No evidence of focal airspace consolidation, pulmonary edema, pleural effusion or pneumothorax. Persistent lingular atelectasis versus scarring, central airway thickening and mild interstitial prominence. IMPRESSION: 1. Stable chest x-ray without evidence of acute cardiopulmonary process. Electronically Signed   By: Malachy Moan M.D.   On: 10/09/2015 11:14   Dg Shoulder Right  10/15/2015  CLINICAL DATA:  Right shoulder pain and bruising following a fall on the bathroom floor. EXAM: RIGHT SHOULDER - 2+ VIEW COMPARISON:  None. FINDINGS: The positioning  and is suboptimal, making it difficult to assess the relationship between the humeral head and glenoid. If there a clinical concern for dislocation, an axillary view would be recommended. No fracture is seen. IMPRESSION: No fracture or gross dislocation seen. If there is a clinical concern for dislocation, an axillary view would be recommended. Electronically Signed   By: Beckie Salts M.D.   On: 10/28/2015 11:17   Dg Elbow Complete Right  10/21/2015  CLINICAL DATA:  Right elbow pain and laceration after falling on the bathroom floor. EXAM: RIGHT ELBOW - COMPLETE 3+ VIEW COMPARISON:  None. FINDINGS: Soft tissue irregularity laterally with overlying bandage material. No fracture, dislocation or effusion seen. Mild degenerative changes. IMPRESSION: 1. No fracture. 2. Mild degenerative changes. Electronically Signed   By: Beckie Salts M.D.   On: 10/28/2015 11:18   Ct Head Wo Contrast  10/11/2015  CLINICAL DATA:  82 year old female with history of trauma from a fall with severe bruising to the face, lips and head. EXAM: CT HEAD WITHOUT CONTRAST CT MAXILLOFACIAL WITHOUT CONTRAST CT CERVICAL SPINE WITHOUT CONTRAST TECHNIQUE: Multidetector CT imaging of the head, cervical spine, and maxillofacial structures were performed using the standard protocol without intravenous contrast. Multiplanar CT image reconstructions of the cervical spine and maxillofacial structures were also generated. COMPARISON:  No priors. FINDINGS: CT HEAD FINDINGS High attenuation fluid collection in the subcutaneous fat of the right infraorbital region, presumably a posttraumatic hematoma. Periorbital soft tissue swelling on the right side. Right-sided orbital floor fracture (discussed below), with high attenuation in the right maxillary sinus, compatible with hemo sinus. Right-sided proptosis. No other acute displaced skull fractures are identified. No acute intracranial abnormality. Specifically, no evidence of acute post-traumatic intracranial hemorrhage, no definite regions of acute/subacute cerebral ischemia, no focal mass, mass effect, hydrocephalus or abnormal intra or extra-axial fluid collections. Mild cerebral atrophy. Patchy and confluent areas of decreased attenuation are noted throughout the deep and periventricular white matter of the cerebral hemispheres bilaterally, compatible with chronic microvascular ischemic disease. CT MAXILLOFACIAL FINDINGS In the subcutaneous fat anterior to the right maxillary sinus there is a 13 x 24 mm high attenuation lesion, compatible with a posttraumatic hematoma. Adjacent soft tissue swelling is noted. Proptosis of the right lobe. Displaced right-sided orbital floor fracture with high attenuation material in the right maxillary sinus, compatible with hemo sinus. There is some inferior herniation of right-sided orbital  fat, but no herniation of extraocular muscles is identified at this time. Pterygoid plates are intact. Mandible is intact, and the mandibular condyles are located bilaterally. Probable nondisplaced nasal bone fractures bilaterally. CT CERVICAL SPINE FINDINGS Acute fracture through the anterior and posterior arches of C1. The anterior arch of C1 is remarkably thin, likely secondary to chronic degenerative changes and pannus at the atlantoaxial articulation. There is approximately 7.5 mm of separation between the left and right sides of the anterior arch of C1. The fracture through the posterior arch of C1 is on the left side, and is approximately 1.3 mm displaced. No other acute displaced fracture of the cervical spine is noted. There is some dystrophic calcification in the soft tissues posterior to the lamina at C3, presumably related to remote trauma. 3 mm of anterolisthesis of C4 upon C5. Alignment is otherwise anatomic. Prevertebral soft tissues are normal. Severe multilevel degenerative disc disease throughout the cervical spine, most pronounced at C4-C5, C5-C6 and C6-C7. Multilevel facet arthropathy. Visualized portions of the upper thorax demonstrate emphysematous changes and some septal thickening. IMPRESSION: 1. Acute displaced fractures through  the anterior and posterior arches of C1, as discussed above. 2. Displaced right orbital floor fracture. At this time there is some downward herniation of extraconal fat, but no entrapment of extraocular muscles. Large amount of hemosinus in the right maxillary sinus. 3. Right globe proptosis. 4. Large hematoma in the subcutaneous fat in the right infraorbital region. 5. Nondisplaced nasal bone fractures bilaterally. 6. No other skull fractures or signs of significant acute intracranial trauma. 7. Mild cerebral atrophy with chronic microvascular ischemic changes in the cerebral white matter, as above. 8. Multilevel degenerative disc disease and cervical spondylosis,  as above. These results were called by telephone at the time of interpretation on 11-20-15 at 2:06 pm to Dr. Doug Sou, who verbally acknowledged these results. Electronically Signed   By: Trudie Reed M.D.   On: 11-20-2015 14:08   Ct Cervical Spine Wo Contrast  11-20-2015  CLINICAL DATA:  82 year old female with history of trauma from a fall with severe bruising to the face, lips and head. EXAM: CT HEAD WITHOUT CONTRAST CT MAXILLOFACIAL WITHOUT CONTRAST CT CERVICAL SPINE WITHOUT CONTRAST TECHNIQUE: Multidetector CT imaging of the head, cervical spine, and maxillofacial structures were performed using the standard protocol without intravenous contrast. Multiplanar CT image reconstructions of the cervical spine and maxillofacial structures were also generated. COMPARISON:  No priors. FINDINGS: CT HEAD FINDINGS High attenuation fluid collection in the subcutaneous fat of the right infraorbital region, presumably a posttraumatic hematoma. Periorbital soft tissue swelling on the right side. Right-sided orbital floor fracture (discussed below), with high attenuation in the right maxillary sinus, compatible with hemo sinus. Right-sided proptosis. No other acute displaced skull fractures are identified. No acute intracranial abnormality. Specifically, no evidence of acute post-traumatic intracranial hemorrhage, no definite regions of acute/subacute cerebral ischemia, no focal mass, mass effect, hydrocephalus or abnormal intra or extra-axial fluid collections. Mild cerebral atrophy. Patchy and confluent areas of decreased attenuation are noted throughout the deep and periventricular white matter of the cerebral hemispheres bilaterally, compatible with chronic microvascular ischemic disease. CT MAXILLOFACIAL FINDINGS In the subcutaneous fat anterior to the right maxillary sinus there is a 13 x 24 mm high attenuation lesion, compatible with a posttraumatic hematoma. Adjacent soft tissue swelling is noted.  Proptosis of the right lobe. Displaced right-sided orbital floor fracture with high attenuation material in the right maxillary sinus, compatible with hemo sinus. There is some inferior herniation of right-sided orbital fat, but no herniation of extraocular muscles is identified at this time. Pterygoid plates are intact. Mandible is intact, and the mandibular condyles are located bilaterally. Probable nondisplaced nasal bone fractures bilaterally. CT CERVICAL SPINE FINDINGS Acute fracture through the anterior and posterior arches of C1. The anterior arch of C1 is remarkably thin, likely secondary to chronic degenerative changes and pannus at the atlantoaxial articulation. There is approximately 7.5 mm of separation between the left and right sides of the anterior arch of C1. The fracture through the posterior arch of C1 is on the left side, and is approximately 1.3 mm displaced. No other acute displaced fracture of the cervical spine is noted. There is some dystrophic calcification in the soft tissues posterior to the lamina at C3, presumably related to remote trauma. 3 mm of anterolisthesis of C4 upon C5. Alignment is otherwise anatomic. Prevertebral soft tissues are normal. Severe multilevel degenerative disc disease throughout the cervical spine, most pronounced at C4-C5, C5-C6 and C6-C7. Multilevel facet arthropathy. Visualized portions of the upper thorax demonstrate emphysematous changes and some septal thickening. IMPRESSION: 1. Acute displaced fractures  through the anterior and posterior arches of C1, as discussed above. 2. Displaced right orbital floor fracture. At this time there is some downward herniation of extraconal fat, but no entrapment of extraocular muscles. Large amount of hemosinus in the right maxillary sinus. 3. Right globe proptosis. 4. Large hematoma in the subcutaneous fat in the right infraorbital region. 5. Nondisplaced nasal bone fractures bilaterally. 6. No other skull fractures or  signs of significant acute intracranial trauma. 7. Mild cerebral atrophy with chronic microvascular ischemic changes in the cerebral white matter, as above. 8. Multilevel degenerative disc disease and cervical spondylosis, as above. These results were called by telephone at the time of interpretation on 25-Oct-2015 at 2:06 pm to Dr. Doug Sou, who verbally acknowledged these results. Electronically Signed   By: Trudie Reed M.D.   On: October 25, 2015 14:08   Ct Abdomen Pelvis W Contrast  October 25, 2015  ADDENDUM REPORT: 2015/10/25 15:02 ADDENDUM: Mentioned in the initial report in the body of the report, but not discussed in the conclusions were the morphologic changes in the liver suggestive of cirrhosis, and the new hypervascular lesion in segment 2 of the liver. While the lesion in segment 2 of the liver is favored to represent a cavernous hemangioma, consideration for nonemergent follow-up with MRI of the abdomen with and without IV gadolinium is recommended (if clinically appropriate) given the cirrhotic appearance of the liver, as an aggressive lesion such as a hepatocellular carcinoma is not excluded. Electronically Signed   By: Trudie Reed M.D.   On: October 25, 2015 15:02  10/25/2015  CLINICAL DATA:  82 year old female with history of trauma from a fall. Bruising on the face, lips and head. EXAM: CT ABDOMEN AND PELVIS WITH CONTRAST TECHNIQUE: Multidetector CT imaging of the abdomen and pelvis was performed using the standard protocol following bolus administration of intravenous contrast. CONTRAST:  80mL ISOVUE-300 IOPAMIDOL (ISOVUE-300) INJECTION 61% COMPARISON:  CT of the abdomen and pelvis 09/29/2009. FINDINGS: Lower chest: There is a dependent opacity in the posterior aspect of the left lower lobe which is somewhat nodular in appearance measuring 11 x 16 mm (image 4 of series 15). Additional linear opacities likely reflect areas of subsegmental atelectasis and/or scarring. Mild cardiomegaly. Mitral  annular calcifications. Atherosclerotic calcifications in the left anterior descending and right coronary arteries. Hepatobiliary: No evidence of significant acute traumatic injury to the liver. 2.4 x 1.4 cm hypervascular lesion in segment 2 of the liver is incompletely characterized. Liver has a slightly shrunken appearance and nodular contour, suggesting underlying cirrhosis. No intra or extrahepatic biliary ductal dilatation. Gallbladder is moderately distended. 1.9 cm gallstone lying dependently in the gallbladder. No inflammatory changes noted adjacent to the gallbladder. Pancreas: No signs of acute traumatic injury to the pancreas. No pancreatic mass. No pancreatic ductal dilatation. No pancreatic or peripancreatic fluid or inflammatory changes. Spleen: No evidence of acute traumatic injury to the spleen. Unremarkable. Adrenals/Urinary Tract: No evidence of acute traumatic injury to either kidney or adrenal gland. 2 cm simple cyst in the posterior aspect of the upper pole of the right kidney. Left kidney is normal in appearance. No hydroureteronephrosis. Urinary bladder is normal in appearance. Stomach/Bowel: The enhanced appearance of the stomach is normal. No pathologic dilatation of small bowel or colon. No findings to suggest acute traumatic injury to the hollow viscera. Numerous colonic diverticulae are noted, without definite surrounding inflammatory changes to suggest an acute diverticulitis at this time. The appendix is not confidently identified may be surgically absent. Regardless, there are no inflammatory changes noted  adjacent to the cecum to suggest the presence of an acute appendicitis at this time. Vascular/Lymphatic: Atherosclerosis throughout the abdominal and pelvic vasculature, without definite aneurysm or dissection. No lymphadenopathy noted in the abdomen or pelvis on today's noncontrast CT examination. Reproductive: Uterus and ovaries are atrophic. Other: No high attenuation fluid  collection in the peritoneal cavity or retroperitoneum to suggest significant posttraumatic hemorrhage. No significant volume of ascites. No pneumoperitoneum. Musculoskeletal: Status post left hip arthroplasty. Old healed fractures of the superior and inferior pubic rami bilaterally. In addition, there is a mildly displaced fracture of the posterior aspect of the right inferior pubic ramus which extends to the ischial spine, best appreciated on coronal images (image 74 of series 17) There are no aggressive appearing lytic or blastic lesions noted in the visualized portions of the skeleton. IMPRESSION: 1. Mildly displaced fracture through the posterior aspect of the right inferior pubic ramus extending to the right ischial spine. 2. No other definite findings of significant acute traumatic injury to the abdomen or pelvis. 3. 1.6 x 1.1 cm nodular density in the posterior aspect of the left lower lobe. This may simply represent some atelectasis and/or scarring, as there are other areas of apparent scarring in the lung bases bilaterally. However, attention on short-term chest CT is recommended in 2-3 months, as the possibility of a neoplasm is not excluded. 4. Cholelithiasis without evidence of acute cholecystitis at this time. 5. Extensive colonic diverticulosis without evidence of acute diverticulitis at this time. 6. Extensive atherosclerosis, including multivessel coronary artery disease. 7. Additional incidental findings, as above. These results were called by telephone at the time of interpretation on 11-10-15 at 2:28 pm to Dr. Doug Sou, who verbally acknowledged these results. Electronically Signed: By: Trudie Reed M.D. On: Nov 10, 2015 14:28   Ct Maxillofacial Wo Cm  11/10/15  CLINICAL DATA:  82 year old female with history of trauma from a fall with severe bruising to the face, lips and head. EXAM: CT HEAD WITHOUT CONTRAST CT MAXILLOFACIAL WITHOUT CONTRAST CT CERVICAL SPINE WITHOUT CONTRAST  TECHNIQUE: Multidetector CT imaging of the head, cervical spine, and maxillofacial structures were performed using the standard protocol without intravenous contrast. Multiplanar CT image reconstructions of the cervical spine and maxillofacial structures were also generated. COMPARISON:  No priors. FINDINGS: CT HEAD FINDINGS High attenuation fluid collection in the subcutaneous fat of the right infraorbital region, presumably a posttraumatic hematoma. Periorbital soft tissue swelling on the right side. Right-sided orbital floor fracture (discussed below), with high attenuation in the right maxillary sinus, compatible with hemo sinus. Right-sided proptosis. No other acute displaced skull fractures are identified. No acute intracranial abnormality. Specifically, no evidence of acute post-traumatic intracranial hemorrhage, no definite regions of acute/subacute cerebral ischemia, no focal mass, mass effect, hydrocephalus or abnormal intra or extra-axial fluid collections. Mild cerebral atrophy. Patchy and confluent areas of decreased attenuation are noted throughout the deep and periventricular white matter of the cerebral hemispheres bilaterally, compatible with chronic microvascular ischemic disease. CT MAXILLOFACIAL FINDINGS In the subcutaneous fat anterior to the right maxillary sinus there is a 13 x 24 mm high attenuation lesion, compatible with a posttraumatic hematoma. Adjacent soft tissue swelling is noted. Proptosis of the right lobe. Displaced right-sided orbital floor fracture with high attenuation material in the right maxillary sinus, compatible with hemo sinus. There is some inferior herniation of right-sided orbital fat, but no herniation of extraocular muscles is identified at this time. Pterygoid plates are intact. Mandible is intact, and the mandibular condyles are located bilaterally. Probable nondisplaced  nasal bone fractures bilaterally. CT CERVICAL SPINE FINDINGS Acute fracture through the anterior  and posterior arches of C1. The anterior arch of C1 is remarkably thin, likely secondary to chronic degenerative changes and pannus at the atlantoaxial articulation. There is approximately 7.5 mm of separation between the left and right sides of the anterior arch of C1. The fracture through the posterior arch of C1 is on the left side, and is approximately 1.3 mm displaced. No other acute displaced fracture of the cervical spine is noted. There is some dystrophic calcification in the soft tissues posterior to the lamina at C3, presumably related to remote trauma. 3 mm of anterolisthesis of C4 upon C5. Alignment is otherwise anatomic. Prevertebral soft tissues are normal. Severe multilevel degenerative disc disease throughout the cervical spine, most pronounced at C4-C5, C5-C6 and C6-C7. Multilevel facet arthropathy. Visualized portions of the upper thorax demonstrate emphysematous changes and some septal thickening. IMPRESSION: 1. Acute displaced fractures through the anterior and posterior arches of C1, as discussed above. 2. Displaced right orbital floor fracture. At this time there is some downward herniation of extraconal fat, but no entrapment of extraocular muscles. Large amount of hemosinus in the right maxillary sinus. 3. Right globe proptosis. 4. Large hematoma in the subcutaneous fat in the right infraorbital region. 5. Nondisplaced nasal bone fractures bilaterally. 6. No other skull fractures or signs of significant acute intracranial trauma. 7. Mild cerebral atrophy with chronic microvascular ischemic changes in the cerebral white matter, as above. 8. Multilevel degenerative disc disease and cervical spondylosis, as above. These results were called by telephone at the time of interpretation on 11/03/2015 at 2:06 pm to Dr. Doug Sou, who verbally acknowledged these results. Electronically Signed   By: Trudie Reed M.D.   On: 10/28/2015 14:08    ASSESSMENT:    Hypoxia (HCC) secondary to facial  trauma    Facial trauma with right orbital blow out fx. Plastic and Opthalmolgy following    C1 fx neuro surgery following, in cervical collar   Coronary atherosclerosis   GERD   Spinal stenosis   LOW BACK PAIN   Diastolic dysfunction with heart failure (HCC)   Fall   Frequent falls     Discussion: 82 yo wf with a history of falls, uses walker who fell 4/15 while in route to bathroom. Found on right side with bloody face. Transported to [facility] ED and CT face reveals blow out fx rt orbit, she has generalized facial ecchymosis , mildly stertorous respirations with poor O2 movent via nasal canals. CT neck revealed c 1 non displaced fx treated with collar. She has extensive cardiac hx and is followed by [facility] cardiology. 4/16 O2 sats were noted to be low and PCCM was consulted. We arrived in room, changed her from nasal cannula to oral O2 with sats promptly increased to 96% on 5 l/m flow. PCCM will follow in SDU, pulmonary toilet as tolerated but coughing should be minimal with blow out orbital fx. Question why she is on Vanc/Zoysn  PLAN:  Change to Venti mask Wean O2 for sats >95 % Mobilize as allowed. Question why she is on Vancomycin and Zoysn. Was ordered in ER.  Brett Canales Minor ACNP Adolph Pollack PCCM Pager [phone] till 3 pm If no answer page [phone] 10/24/2015, 11:25 AM   Attending Note:  I have examined patient, reviewed labs, studies and notes. I have discussed the case with S Minor, and I agree with the data and plans as amended above. Pleasant 82 yo woman  s/p recent fall with severe facial injury and C1 fx. She is hypoxemic, suspect multifactorial > atelectasis from immobility, inability to breathe thru nasopharynx, superimposed on her chronic ischemic CHF. She has gained some wt over last few days, but her CXR does not suggest overt cardiogenic edema. Agree with extra dose lasix today and then continue her usual daily dose. Will convert her O2 to by mouth temporarily. Need to attempt  pulm hygiene but must be careful given her facial injuries. Should at least try to get OOB to chair. She is on broad spectrum abx, ? Due to her facial injuries. Unclearto me whether she needs to continue these. She was recently treated for enterococcal UTI, should not be a contributor here. We will follow her with you.  Levy Pupa, MD, PhD 10/24/2015, 11:45 AM [facility] [phone] or if no answer [phone]

## 2015-10-24 NOTE — Progress Notes (Signed)
Orthopedic Tech Progress Note Patient Details:  Julia Black 05/17/1919 [id number]  Patient ID: Julia Black, female   DOB: 06/24/1919, 82 y.o.   MRN: [id number] Called in bio-tech brace order; spoke with Julia NeighboursLarry  Julia Black 10/24/2015, 9:18 AM

## 2015-10-24 NOTE — Consult Note (Signed)
Consultation Note Date: 10/24/2015   Patient Name: Julia Black  DOB: 1918-12-02  MRN: [id number]  Age / Sex: 82 y.o., female  PCP: Binnie Rail, MD Referring Physician: Trauma Md, MD  Reason for Consultation: Establishing goals of care, Non pain symptom management, Pain control and Psychosocial/spiritual support    Clinical Assessment/Narrative: Patient is a 82 year old female who fell 4-15 while on route to the bathroom. She was found on her right side with the bloody face after being down for an unknown period of time. She has generalized facial bruising fractured nose, pelvic fracture, and CT of the face reveals a blowout fracture of the right orbit. She also has a non-displaced  cervical fracture and is currently wearing a hard collar She is having some difficulty breathing and she 2/2 nasal fracture and thus cannot use a nasal cannula. When I see her today she has the cannula in her mouth. She is in the process of being moved to an intermediate care unit because of concerns over her respiratory status. Patient has a significant history of chronic pain secondary to spinal stenosis, history of prior pelvic fracture history of hip fracture in 2011, and has been seen by palliative care provider in the independent facility where she resides for chronic pain management. She is currently on fentanyl transdermal 50 g every 72 hours and Vicodin 1-2 tabs every 4 hours when necessary. She is currently denying facial pain and states she is having some back pain. Met with patient's 2 sons and daughter-in-law's who share that they have been concerned about patient living in independent living for a while now. She has had several falls without injury prior to this event. Patient at baseline uses a walker and is compliant with this. She also has an emergency life alert necklace, but this was draped across her walker and was not within  reach  Contacts/Participants in Discussion: Primary Decision Maker: Pt at this point   Relationship to Patient n/a HCPOA: yes  Patient has designated her son Bonney Roussel and her daughter Manuela Schwartz (who was not present for our meeting today) as her healthcare power of attorney's  SUMMARY OF RECOMMENDATIONS Patient could benefit from further pain medication evaluation given her chronic pain condition and now acute pain. Palliative medicine team to follow up 4/17 to evaluate her current pain regimen. My concern now with acute on chronic pain that her previous regimen may not be effective for relief of pain especially if she is to participate in any level of rehabilitation At this point Family wishes to pursue skilled nursing facility for further rehabilitation after she is medically stabilized Family very clear that patient wishes to remain DO NOT RESUSCITATE, DO NOT INTUBATE and patient reiterates this I did review with family my concern over multiple falls and how this fall  with injury could  herald the beginning of clinical decline especially in someone 82 years old with a previous medical history of coronary artery disease, CABG, systolic heart failure, cardiomyopathy, hypertension, atrial fib. Did introduce the concept of hospice care in the future to support patient and family  Code Status/Advance Care Planning: DNR    Code Status Orders        Start     Ordered   11/01/2015 1831  Do not attempt resuscitation (DNR)   Continuous    Question Answer Comment  In the event of cardiac or respiratory ARREST Do not call a "code blue"   In the event of cardiac or respiratory ARREST Do not  perform Intubation, CPR, defibrillation or ACLS   In the event of cardiac or respiratory ARREST Use medication by any route, position, wound care, and other measures to relive pain and suffering. May use oxygen, suction and manual treatment of airway obstruction as needed for comfort.      10/13/2015 1831    Code Status  History    Date Active Date Inactive Code Status Order ID Comments User Context   11/07/2015  4:30 PM 10/17/2015  6:30 PM Full Code [id number]  Rolm Bookbinder, MD ED   06/10/2014 12:37 AM 06/15/2014  4:29 PM DNR [id number]  Rise Patience, MD Inpatient    Advance Directive Documentation        Most Recent Value   Type of Advance Directive  Out of facility DNR (pink MOST or yellow form)   Pre-existing out of facility DNR order (yellow form or pink MOST form)     "MOST" Form in Place?        Other Directives:None  Symptom Management:  Pain: Continue fentanyl patch at 50 g every 72 hours, and Vicodin 1-2 tabs every 4 hours when necessary. She has been on this medication regimen for approximately one year. PMT will follow-up for need for pain management recommendations Anxiety: Patient is requesting Xanax. Per patient and family she is on this chronically at Xanax 0.25 mg every 8 when necessary. Will add this low dose back to her medication regimen Constipation: We'll schedule senna 2 tabs daily at bedtime. Patient at high risk for constipation secondary to chronic opioid usage and now decreased mobility Palliative Prophylaxis:   Aspiration, Bowel Regimen, Delirium Protocol, Frequent Pain Assessment, Oral Care and Turn Reposition  Additional Recommendations (Limitations, Scope, Preferences):  No Artificial Feeding, No Hemodialysis, No Radiation and No Tracheostomy   Psycho-social/Spiritual:  Support System: Strong Desire for further Chaplaincy support:no  Prognosis: Unable to determine  Discharge Planning: [place] for rehab with Palliative care service follow-up   Chief Complaint/ Primary Diagnoses: Present on Admission:  . Fall . Coronary atherosclerosis . GERD . Spinal stenosis . LOW BACK PAIN . Diastolic dysfunction with heart failure (HCC)  I have reviewed the medical record, interviewed the patient and family, and examined the patient. The following  aspects are pertinent.  Past Medical History  Diagnosis Date  . SPINAL STENOSIS   . LOW BACK PAIN     chronic, follows with Nsurg for ESI  . FRACTURE, PELVIS, RIGHT 12/2009  . HIP FRACTURE, LEFT 09/2009    s/p L THA  . ALLERGIC RHINITIS   . Carpal tunnel syndrome   . Hypertension   . HYPERLIPIDEMIA   . GERD   . Arthritis   . Osteoporosis   . Atrial fibrillation ([place])     no anticoag due to fall risk/age  . DEPRESSION   . CORONARY ARTERY DISEASE 2004    a.  LHC (01/2003):  3 v CAD => s/p CABG.;   b.  Myoview (02/2013):  Anterior scar; no ischemia; EF 49%.    . Systolic congestive heart failure ([place])   . Cardiomyopathy ([place])     probable [place] CM - a. Echo (02/2013):  EF 25-30%.;   b.  Echo (04/04/13):  Mild focal basal septal hypertrophy, EF 60-65%, no RWMA, Gr 2 DD, mild to mod MAC, mild bileaflet MVP, mild to mod MR, mild to mod LAE, mild RAE, PASP 48.  [name] H/O Doppler ultrasound     Renal Artery [place] (09/2010):  No RAS.  [name]  Carotid artery occlusion     Carotid US (05/2010):  RICA 16-10%; LICA 9-60%  . Anemia    Social History   Social History  . Marital Status: Divorced    Spouse Name: N/A  . Number of Children: N/A  . Years of Education: N/A   Social History Main Topics  . Smoking status: Former Smoker    Types: Cigarettes  . Smokeless tobacco: Never Used  . Alcohol Use: No  . Drug Use: No  . Sexual Activity: No   Other Topics Concern  . None   Social History Narrative   ** Merged History Encounter **       ACP - HCPOA Tonette Bihari (c) [phone]; No CPR,  No mechanical ventilation.    Family History  Problem Relation Age of Onset  . Arthritis Mother   . Breast cancer Mother   . Heart disease Mother   . Hypertension Mother   . Arthritis Father   . Heart disease Father   . Hypertension Father   . Arthritis Maternal Grandmother   . Arthritis Maternal Grandfather   . Arthritis Paternal Grandmother   . Arthritis Paternal Grandfather   . Breast cancer  Daughter   . Cancer Mother 68  . Emphysema Father 50  . Heart attack Brother 65   Scheduled Meds: . docusate sodium  100 mg Oral BID  . enoxaparin (LOVENOX) injection  40 mg Subcutaneous Q24H  . fentaNYL  50 mcg Transdermal Q72H  . furosemide  20 mg Oral Daily  . metoprolol tartrate  12.5 mg Oral BID  . pantoprazole  40 mg Oral Daily  . piperacillin-tazobactam (ZOSYN)  IV  3.375 g Intravenous Q8H  . potassium chloride SA  20 mEq Oral Daily  . pregabalin  50 mg Oral TID  . senna  2 tablet Oral QHS  . vancomycin  500 mg Intravenous Q24H  . venlafaxine XR  75 mg Oral Q breakfast   Continuous Infusions: . sodium chloride 40 mL/hr (10/24/15 1425)   PRN Meds:.acetaminophen, bisacodyl, HYDROcodone-acetaminophen, HYDROcodone-acetaminophen, morphine injection, nitroGLYCERIN, ondansetron **OR** ondansetron (ZOFRAN) IV Medications Prior to Admission:  Prior to Admission medications   Medication Sig Start Date End Date Taking? Authorizing Provider  ALPRAZolam (XANAX) 0.25 MG tablet Take 1 tablet (0.25 mg total) by mouth 2 (two) times daily as needed for anxiety. 03/16/15  Yes Rowe Clack, MD  aspirin 81 MG tablet Take 81 mg by mouth at bedtime.    Yes Historical Provider, MD  Cholecalciferol (VITAMIN D PO) Take 500 Units by mouth daily.   Yes Historical Provider, MD  docusate sodium (DOK) 100 MG capsule Take 1 capsule (100 mg total) by mouth daily. 06/09/15  Yes Binnie Rail, MD  fentaNYL (DURAGESIC) 50 MCG/HR Place 1 patch (50 mcg total) onto the skin every 3 (three) days. 10/06/15 10/03/16 Yes Binnie Rail, MD  furosemide (LASIX) 20 MG tablet TAKE 40 MG BY MOUTH DAILY 07/31/15  Yes Binnie Rail, MD  HYDROcodone-acetaminophen (NORCO/VICODIN) 5-325 MG tablet Take 2 tablets by mouth every 4 (four) hours as needed. 10/06/15  Yes Binnie Rail, MD  LYRICA 50 MG capsule TAKE ONE CAPSULE THREE TIMES DAILY 08/23/15  Yes Biagio Borg, MD  metoprolol tartrate (LOPRESSOR) 25 MG tablet Take 0.5  tablets (12.5 mg total) by mouth 2 (two) times daily. 03/16/15  Yes Rowe Clack, MD  Multiple Vitamin (MULTIVITAMIN WITH MINERALS) TABS Take 1 tablet by mouth daily.   Yes Historical Provider, MD  nitrofurantoin, macrocrystal-monohydrate, (MACROBID) 100 MG capsule Take 1 capsule (100 mg total) by mouth 2 (two) times daily. 10/19/15  Yes Pincus Sanes, MD  nitroGLYCERIN (NITROSTAT) 0.4 MG SL tablet Place 1 tablet (0.4 mg total) under the tongue every 5 (five) minutes as needed for chest pain (upt o 3 doses). 03/16/15  Yes Newt Lukes, MD  pantoprazole (PROTONIX) 40 MG tablet Take 1 tablet (40 mg total) by mouth daily. 03/16/15  Yes Newt Lukes, MD  potassium chloride SA (K-DUR,KLOR-CON) 20 MEQ tablet Take 1 tablet (20 mEq total) by mouth daily. 12/28/14  Yes Newt Lukes, MD  pravastatin (PRAVACHOL) 40 MG tablet Take 1 tablet (40 mg total) by mouth every evening. 03/16/15  Yes Newt Lukes, MD  senna (SENOKOT) 8.6 MG TABS tablet Take 2 tablets (17.2 mg total) by mouth at bedtime. 05/05/15  Yes Pincus Sanes, MD  venlafaxine XR (EFFEXOR-XR) 75 MG 24 hr capsule TAKE ONE CAPSULE EACH DAY WITH BREAKFAST 06/22/15  Yes Pincus Sanes, MD  vitamin C (ASCORBIC ACID) 500 MG tablet Take 500 mg by mouth daily.   Yes Historical Provider, MD  zolpidem (AMBIEN) 5 MG tablet TAKE ONE TABLET AT BEDTIME AS NEEDED FOR SLEEP 10/18/15  Yes Pincus Sanes, MD   Allergies  Allergen Reactions  . Amoxicillin Nausea Only    Upset stomach with high dose  . Sporanox [Itraconazole] Hives  . Sulfa Antibiotics Nausea Only    Review of Systems  HENT: Negative.   Eyes: Negative.   Respiratory: Negative.   Cardiovascular: Negative.   Endocrine: Negative.   Genitourinary: Negative.   Musculoskeletal: Positive for back pain.       I hurt all over for years  Allergic/Immunologic: Negative.   Neurological: Positive for weakness.  Hematological: Negative.   Psychiatric/Behavioral: Negative.      Physical Exam  Constitutional: She is oriented to person, place, and time.  Cachetic frail elderly female with severe facial bruising and trauma  Respiratory: Effort normal.  Neurological: She is alert and oriented to person, place, and time.  Skin:  Multiple areas of bruising to shoulders, face hip and legs  Psychiatric:  Feels anxious    Vital Signs: BP 108/43 mmHg  Pulse 94  Temp(Src) 97.6 F (36.4 C) (Oral)  Resp 17  Ht 5\' 3"  (1.6 m)  Wt 51.4 kg (113 lb 5.1 oz)  BMI 20.08 kg/m2  SpO2 98%  SpO2: SpO2: 98 % O2 Device:SpO2: 98 % O2 Flow Rate: .O2 Flow Rate (L/min): 3.5 L/min  IO: Intake/output summary:  Intake/Output Summary (Last 24 hours) at 10/24/15 1435 Last data filed at 10/24/15 1037  Gross per 24 hour  Intake    480 ml  Output    200 ml  Net    280 ml    LBM: Last BM Date: 10/22/15 Baseline Weight: Weight: 49.896 kg (110 lb) Most recent weight: Weight: 51.4 kg (113 lb 5.1 oz)      Palliative Assessment/Data:  Flowsheet Rows        Most Recent Value   Intake Tab    Clinical Assessment    Palliative Performance Scale Score  30%   Pain Max last 24 hours  8   Pain Min Last 24 hours  3   Dyspnea Max Last 24 Hours  Not able to report   Dyspnea Min Last 24 hours  Not able to report   Nausea Max Last 24 Hours  0   Nausea Min Last 24  Hours  0   Anxiety Max Last 24 Hours  7   Anxiety Min Last 24 Hours  3   Psychosocial & Spiritual Assessment    Palliative Care Outcomes    Palliative Care Outcomes  Clarified goals of care, Counseled regarding hospice, Provided psychosocial or spiritual support   Patient/Family wishes: Interventions discontinued/not started   Mechanical Ventilation, NIPPV, Trach, PEG, Hemodialysis, Vasopressors, Tube feedings/TPN   Palliative Care follow-up planned  Yes, Facility      Additional Data Reviewed:  CBC:    Component Value Date/Time   WBC 6.5 10/24/2015 0353   HGB 9.9* 10/24/2015 0353   HCT 32.8* 10/24/2015 0353   PLT  150 10/24/2015 0353   MCV 89.6 10/24/2015 0353   NEUTROABS 7.5 10/20/2015 1054   LYMPHSABS 0.3* 11/02/2015 1054   MONOABS 0.7 10/19/2015 1054   EOSABS 0.0 10/17/2015 1054   BASOSABS 0.0 10/24/2015 1054   Comprehensive Metabolic Panel:    Component Value Date/Time   NA 144 10/24/2015 0353   K 3.6 10/24/2015 0353   CL 105 10/24/2015 0353   CO2 31 10/24/2015 0353   BUN 19 10/24/2015 0353   CREATININE 0.72 10/24/2015 0353   CREATININE 0.74 09/22/2014 1103   GLUCOSE 90 10/24/2015 0353   CALCIUM 8.3* 10/24/2015 0353   AST 42* 11/01/2015 1054   ALT 25 10/15/2015 1054   ALKPHOS 144* 10/13/2015 1054   BILITOT 0.8 10/16/2015 1054   PROT 6.4* 10/25/2015 1054   ALBUMIN 2.8* 10/18/2015 1054     Time In: 1300 Time Out: 1420 Time Total: 80 min Greater than 50%  of this time was spent counseling and coordinating care related to the above assessment and plan.  Signed by: Dory Horn, NP  Dory Horn, NP  10/24/2015, 2:35 PM  Please contact Palliative Medicine Team phone at [phone] for questions and concerns.

## 2015-10-24 NOTE — Progress Notes (Addendum)
Subjective: Stable and alert.  Daughter at bedside Oriented Says she hurts everywhere  Appreciate evaluation by Dr. Dione Booze and laceration repair. C1 fracture discussed last night with Dr. Jordan Likes who says she will need a collar for 2 months.  We'll need to clarify whether she can move to a soft collar. Dr. Ulice Bold to evaluate facial fractures. PT to see today.  Objective: Vital signs in last 24 hours: Temp:  [94.1 F (34.5 C)-99.5 F (37.5 C)] 99.5 F (37.5 C) (04/15 2109) Pulse Rate:  [70-111] 96 (04/15 2109) Resp:  [14-24] 17 (04/15 2109) BP: (114-165)/(53-88) 135/56 mmHg (04/15 2109) SpO2:  [90 %-100 %] 98 % (04/15 2109) Weight:  [49.896 kg (110 lb)] 49.896 kg (110 lb) (04/15 0959)    Intake/Output from previous day: 04/15 0701 - 04/16 0700 In: 250 [I.V.:250] Out: 200 [Urine:200] Intake/Output this shift:    General appearance: Alert and oriented.  Minimal distress considering injuries. Head: Normocephalic, without obvious abnormality, atraumatic, Right orbit with significant periorbital edema.  Right eye intact globe.  Counts fingers.  Left eye intact.  Extraocular movements intact.  No disconjugate gaze.  Both pupils 2-3 mm and react.  Laceration repaired. Neck: Hard collar in place but poor fit.  trachea midline, thyroid not enlarged, symmetric, no tenderness/mass/nodules and No significant neck swelling.  Nontender posteriorly.  She has a hurts a little when she flexes her neck.  Hard collar still in place. Resp: clear to auscultation bilaterally GI: soft, non-tender; bowel sounds normal; no masses,  no organomegaly.  Old midline scar well-healed. Extremities: Ecchymoses right shoulder.  She moves all 4 extr.to command Neurologic: Grossly normal.  Alert.  Oriented 4.  Moves all 4 activities to command without focal deficit.  Lab Results:   Recent Labs  10/15/2015 1054 10/24/15 0353  WBC 8.5 6.5  HGB 11.1* 9.9*  HCT 35.8* 32.8*  PLT 143* 150   BMET  Recent  Labs  10/20/2015 1054 10/24/15 0353  NA 143 144  K 3.4* 3.6  CL 100* 105  CO2 31 31  GLUCOSE 116* 90  BUN 26* 19  CREATININE 0.66 0.72  CALCIUM 8.7* 8.3*   PT/INR No results for input(s): LABPROT, INR in the last 72 hours. ABG No results for input(s): PHART, HCO3 in the last 72 hours.  Invalid input(s): PCO2, PO2  Studies/Results: Dg Chest 1 View  11/05/2015  CLINICAL DATA:  82 year old female found down in the bathroom EXAM: CHEST 1 VIEW COMPARISON:  Prior chest x-ray 03/16/2015 FINDINGS: Stable borderline cardiomegaly. Patient is status post median sternotomy with evidence of multivessel CABG. Atherosclerotic calcifications again noted within the transverse aorta. Levoconvex and rotary lumbar scoliosis with the apex at L2-L3. No evidence of focal airspace consolidation, pulmonary edema, pleural effusion or pneumothorax. Persistent lingular atelectasis versus scarring, central airway thickening and mild interstitial prominence. IMPRESSION: 1. Stable chest x-ray without evidence of acute cardiopulmonary process. Electronically Signed   By: Malachy Moan M.D.   On: 10/16/2015 11:14   Dg Shoulder Right  10/25/2015  CLINICAL DATA:  Right shoulder pain and bruising following a fall on the bathroom floor. EXAM: RIGHT SHOULDER - 2+ VIEW COMPARISON:  None. FINDINGS: The positioning and is suboptimal, making it difficult to assess the relationship between the humeral head and glenoid. If there a clinical concern for dislocation, an axillary view would be recommended. No fracture is seen. IMPRESSION: No fracture or gross dislocation seen. If there is a clinical concern for dislocation, an axillary view would be recommended. Electronically Signed  By: Beckie Salts M.D.   On: 10/20/2015 11:17   Dg Elbow Complete Right  10/19/2015  CLINICAL DATA:  Right elbow pain and laceration after falling on the bathroom floor. EXAM: RIGHT ELBOW - COMPLETE 3+ VIEW COMPARISON:  None. FINDINGS: Soft tissue  irregularity laterally with overlying bandage material. No fracture, dislocation or effusion seen. Mild degenerative changes. IMPRESSION: 1. No fracture. 2. Mild degenerative changes. Electronically Signed   By: Beckie Salts M.D.   On: 11/01/2015 11:18   Ct Head Wo Contrast  10/19/2015  CLINICAL DATA:  82 year old female with history of trauma from a fall with severe bruising to the face, lips and head. EXAM: CT HEAD WITHOUT CONTRAST CT MAXILLOFACIAL WITHOUT CONTRAST CT CERVICAL SPINE WITHOUT CONTRAST TECHNIQUE: Multidetector CT imaging of the head, cervical spine, and maxillofacial structures were performed using the standard protocol without intravenous contrast. Multiplanar CT image reconstructions of the cervical spine and maxillofacial structures were also generated. COMPARISON:  No priors. FINDINGS: CT HEAD FINDINGS High attenuation fluid collection in the subcutaneous fat of the right infraorbital region, presumably a posttraumatic hematoma. Periorbital soft tissue swelling on the right side. Right-sided orbital floor fracture (discussed below), with high attenuation in the right maxillary sinus, compatible with hemo sinus. Right-sided proptosis. No other acute displaced skull fractures are identified. No acute intracranial abnormality. Specifically, no evidence of acute post-traumatic intracranial hemorrhage, no definite regions of acute/subacute cerebral ischemia, no focal mass, mass effect, hydrocephalus or abnormal intra or extra-axial fluid collections. Mild cerebral atrophy. Patchy and confluent areas of decreased attenuation are noted throughout the deep and periventricular white matter of the cerebral hemispheres bilaterally, compatible with chronic microvascular ischemic disease. CT MAXILLOFACIAL FINDINGS In the subcutaneous fat anterior to the right maxillary sinus there is a 13 x 24 mm high attenuation lesion, compatible with a posttraumatic hematoma. Adjacent soft tissue swelling is noted.  Proptosis of the right lobe. Displaced right-sided orbital floor fracture with high attenuation material in the right maxillary sinus, compatible with hemo sinus. There is some inferior herniation of right-sided orbital fat, but no herniation of extraocular muscles is identified at this time. Pterygoid plates are intact. Mandible is intact, and the mandibular condyles are located bilaterally. Probable nondisplaced nasal bone fractures bilaterally. CT CERVICAL SPINE FINDINGS Acute fracture through the anterior and posterior arches of C1. The anterior arch of C1 is remarkably thin, likely secondary to chronic degenerative changes and pannus at the atlantoaxial articulation. There is approximately 7.5 mm of separation between the left and right sides of the anterior arch of C1. The fracture through the posterior arch of C1 is on the left side, and is approximately 1.3 mm displaced. No other acute displaced fracture of the cervical spine is noted. There is some dystrophic calcification in the soft tissues posterior to the lamina at C3, presumably related to remote trauma. 3 mm of anterolisthesis of C4 upon C5. Alignment is otherwise anatomic. Prevertebral soft tissues are normal. Severe multilevel degenerative disc disease throughout the cervical spine, most pronounced at C4-C5, C5-C6 and C6-C7. Multilevel facet arthropathy. Visualized portions of the upper thorax demonstrate emphysematous changes and some septal thickening. IMPRESSION: 1. Acute displaced fractures through the anterior and posterior arches of C1, as discussed above. 2. Displaced right orbital floor fracture. At this time there is some downward herniation of extraconal fat, but no entrapment of extraocular muscles. Large amount of hemosinus in the right maxillary sinus. 3. Right globe proptosis. 4. Large hematoma in the subcutaneous fat in the right infraorbital region.  5. Nondisplaced nasal bone fractures bilaterally. 6. No other skull fractures or  signs of significant acute intracranial trauma. 7. Mild cerebral atrophy with chronic microvascular ischemic changes in the cerebral white matter, as above. 8. Multilevel degenerative disc disease and cervical spondylosis, as above. These results were called by telephone at the time of interpretation on 10/12/2015 at 2:06 pm to Dr. Doug Sou, who verbally acknowledged these results. Electronically Signed   By: Trudie Reed M.D.   On: 10/10/2015 14:08   Ct Cervical Spine Wo Contrast  10/25/2015  CLINICAL DATA:  82 year old female with history of trauma from a fall with severe bruising to the face, lips and head. EXAM: CT HEAD WITHOUT CONTRAST CT MAXILLOFACIAL WITHOUT CONTRAST CT CERVICAL SPINE WITHOUT CONTRAST TECHNIQUE: Multidetector CT imaging of the head, cervical spine, and maxillofacial structures were performed using the standard protocol without intravenous contrast. Multiplanar CT image reconstructions of the cervical spine and maxillofacial structures were also generated. COMPARISON:  No priors. FINDINGS: CT HEAD FINDINGS High attenuation fluid collection in the subcutaneous fat of the right infraorbital region, presumably a posttraumatic hematoma. Periorbital soft tissue swelling on the right side. Right-sided orbital floor fracture (discussed below), with high attenuation in the right maxillary sinus, compatible with hemo sinus. Right-sided proptosis. No other acute displaced skull fractures are identified. No acute intracranial abnormality. Specifically, no evidence of acute post-traumatic intracranial hemorrhage, no definite regions of acute/subacute cerebral ischemia, no focal mass, mass effect, hydrocephalus or abnormal intra or extra-axial fluid collections. Mild cerebral atrophy. Patchy and confluent areas of decreased attenuation are noted throughout the deep and periventricular white matter of the cerebral hemispheres bilaterally, compatible with chronic microvascular ischemic disease.  CT MAXILLOFACIAL FINDINGS In the subcutaneous fat anterior to the right maxillary sinus there is a 13 x 24 mm high attenuation lesion, compatible with a posttraumatic hematoma. Adjacent soft tissue swelling is noted. Proptosis of the right lobe. Displaced right-sided orbital floor fracture with high attenuation material in the right maxillary sinus, compatible with hemo sinus. There is some inferior herniation of right-sided orbital fat, but no herniation of extraocular muscles is identified at this time. Pterygoid plates are intact. Mandible is intact, and the mandibular condyles are located bilaterally. Probable nondisplaced nasal bone fractures bilaterally. CT CERVICAL SPINE FINDINGS Acute fracture through the anterior and posterior arches of C1. The anterior arch of C1 is remarkably thin, likely secondary to chronic degenerative changes and pannus at the atlantoaxial articulation. There is approximately 7.5 mm of separation between the left and right sides of the anterior arch of C1. The fracture through the posterior arch of C1 is on the left side, and is approximately 1.3 mm displaced. No other acute displaced fracture of the cervical spine is noted. There is some dystrophic calcification in the soft tissues posterior to the lamina at C3, presumably related to remote trauma. 3 mm of anterolisthesis of C4 upon C5. Alignment is otherwise anatomic. Prevertebral soft tissues are normal. Severe multilevel degenerative disc disease throughout the cervical spine, most pronounced at C4-C5, C5-C6 and C6-C7. Multilevel facet arthropathy. Visualized portions of the upper thorax demonstrate emphysematous changes and some septal thickening. IMPRESSION: 1. Acute displaced fractures through the anterior and posterior arches of C1, as discussed above. 2. Displaced right orbital floor fracture. At this time there is some downward herniation of extraconal fat, but no entrapment of extraocular muscles. Large amount of hemosinus  in the right maxillary sinus. 3. Right globe proptosis. 4. Large hematoma in the subcutaneous fat in the right  infraorbital region. 5. Nondisplaced nasal bone fractures bilaterally. 6. No other skull fractures or signs of significant acute intracranial trauma. 7. Mild cerebral atrophy with chronic microvascular ischemic changes in the cerebral white matter, as above. 8. Multilevel degenerative disc disease and cervical spondylosis, as above. These results were called by telephone at the time of interpretation on 03-23-16 at 2:06 pm to Dr. Doug SouSAM JACUBOWITZ, who verbally acknowledged these results. Electronically Signed   By: Trudie Reedaniel  Entrikin M.D.   On: 009-14-17 14:08   Ct Abdomen Pelvis W Contrast  03-23-16  ADDENDUM REPORT: 009-14-17 15:02 ADDENDUM: Mentioned in the initial report in the body of the report, but not discussed in the conclusions were the morphologic changes in the liver suggestive of cirrhosis, and the new hypervascular lesion in segment 2 of the liver. While the lesion in segment 2 of the liver is favored to represent a cavernous hemangioma, consideration for nonemergent follow-up with MRI of the abdomen with and without IV gadolinium is recommended (if clinically appropriate) given the cirrhotic appearance of the liver, as an aggressive lesion such as a hepatocellular carcinoma is not excluded. Electronically Signed   By: Trudie Reedaniel  Entrikin M.D.   On: 009-14-17 15:02  03-23-16  CLINICAL DATA:  82 year old female with history of trauma from a fall. Bruising on the face, lips and head. EXAM: CT ABDOMEN AND PELVIS WITH CONTRAST TECHNIQUE: Multidetector CT imaging of the abdomen and pelvis was performed using the standard protocol following bolus administration of intravenous contrast. CONTRAST:  80mL ISOVUE-300 IOPAMIDOL (ISOVUE-300) INJECTION 61% COMPARISON:  CT of the abdomen and pelvis 09/29/2009. FINDINGS: Lower chest: There is a dependent opacity in the posterior aspect of the left lower  lobe which is somewhat nodular in appearance measuring 11 x 16 mm (image 4 of series 15). Additional linear opacities likely reflect areas of subsegmental atelectasis and/or scarring. Mild cardiomegaly. Mitral annular calcifications. Atherosclerotic calcifications in the left anterior descending and right coronary arteries. Hepatobiliary: No evidence of significant acute traumatic injury to the liver. 2.4 x 1.4 cm hypervascular lesion in segment 2 of the liver is incompletely characterized. Liver has a slightly shrunken appearance and nodular contour, suggesting underlying cirrhosis. No intra or extrahepatic biliary ductal dilatation. Gallbladder is moderately distended. 1.9 cm gallstone lying dependently in the gallbladder. No inflammatory changes noted adjacent to the gallbladder. Pancreas: No signs of acute traumatic injury to the pancreas. No pancreatic mass. No pancreatic ductal dilatation. No pancreatic or peripancreatic fluid or inflammatory changes. Spleen: No evidence of acute traumatic injury to the spleen. Unremarkable. Adrenals/Urinary Tract: No evidence of acute traumatic injury to either kidney or adrenal gland. 2 cm simple cyst in the posterior aspect of the upper pole of the right kidney. Left kidney is normal in appearance. No hydroureteronephrosis. Urinary bladder is normal in appearance. Stomach/Bowel: The enhanced appearance of the stomach is normal. No pathologic dilatation of small bowel or colon. No findings to suggest acute traumatic injury to the hollow viscera. Numerous colonic diverticulae are noted, without definite surrounding inflammatory changes to suggest an acute diverticulitis at this time. The appendix is not confidently identified may be surgically absent. Regardless, there are no inflammatory changes noted adjacent to the cecum to suggest the presence of an acute appendicitis at this time. Vascular/Lymphatic: Atherosclerosis throughout the abdominal and pelvic vasculature,  without definite aneurysm or dissection. No lymphadenopathy noted in the abdomen or pelvis on today's noncontrast CT examination. Reproductive: Uterus and ovaries are atrophic. Other: No high attenuation fluid collection in the peritoneal cavity  or retroperitoneum to suggest significant posttraumatic hemorrhage. No significant volume of ascites. No pneumoperitoneum. Musculoskeletal: Status post left hip arthroplasty. Old healed fractures of the superior and inferior pubic rami bilaterally. In addition, there is a mildly displaced fracture of the posterior aspect of the right inferior pubic ramus which extends to the ischial spine, best appreciated on coronal images (image 74 of series 17) There are no aggressive appearing lytic or blastic lesions noted in the visualized portions of the skeleton. IMPRESSION: 1. Mildly displaced fracture through the posterior aspect of the right inferior pubic ramus extending to the right ischial spine. 2. No other definite findings of significant acute traumatic injury to the abdomen or pelvis. 3. 1.6 x 1.1 cm nodular density in the posterior aspect of the left lower lobe. This may simply represent some atelectasis and/or scarring, as there are other areas of apparent scarring in the lung bases bilaterally. However, attention on short-term chest CT is recommended in 2-3 months, as the possibility of a neoplasm is not excluded. 4. Cholelithiasis without evidence of acute cholecystitis at this time. 5. Extensive colonic diverticulosis without evidence of acute diverticulitis at this time. 6. Extensive atherosclerosis, including multivessel coronary artery disease. 7. Additional incidental findings, as above. These results were called by telephone at the time of interpretation on 11-10-2015 at 2:28 pm to Dr. Doug Sou, who verbally acknowledged these results. Electronically Signed: By: Trudie Reed M.D. On: 10-Nov-2015 14:28   Ct Maxillofacial Wo Cm  Nov 10, 2015  CLINICAL DATA:   82 year old female with history of trauma from a fall with severe bruising to the face, lips and head. EXAM: CT HEAD WITHOUT CONTRAST CT MAXILLOFACIAL WITHOUT CONTRAST CT CERVICAL SPINE WITHOUT CONTRAST TECHNIQUE: Multidetector CT imaging of the head, cervical spine, and maxillofacial structures were performed using the standard protocol without intravenous contrast. Multiplanar CT image reconstructions of the cervical spine and maxillofacial structures were also generated. COMPARISON:  No priors. FINDINGS: CT HEAD FINDINGS High attenuation fluid collection in the subcutaneous fat of the right infraorbital region, presumably a posttraumatic hematoma. Periorbital soft tissue swelling on the right side. Right-sided orbital floor fracture (discussed below), with high attenuation in the right maxillary sinus, compatible with hemo sinus. Right-sided proptosis. No other acute displaced skull fractures are identified. No acute intracranial abnormality. Specifically, no evidence of acute post-traumatic intracranial hemorrhage, no definite regions of acute/subacute cerebral ischemia, no focal mass, mass effect, hydrocephalus or abnormal intra or extra-axial fluid collections. Mild cerebral atrophy. Patchy and confluent areas of decreased attenuation are noted throughout the deep and periventricular white matter of the cerebral hemispheres bilaterally, compatible with chronic microvascular ischemic disease. CT MAXILLOFACIAL FINDINGS In the subcutaneous fat anterior to the right maxillary sinus there is a 13 x 24 mm high attenuation lesion, compatible with a posttraumatic hematoma. Adjacent soft tissue swelling is noted. Proptosis of the right lobe. Displaced right-sided orbital floor fracture with high attenuation material in the right maxillary sinus, compatible with hemo sinus. There is some inferior herniation of right-sided orbital fat, but no herniation of extraocular muscles is identified at this time. Pterygoid plates  are intact. Mandible is intact, and the mandibular condyles are located bilaterally. Probable nondisplaced nasal bone fractures bilaterally. CT CERVICAL SPINE FINDINGS Acute fracture through the anterior and posterior arches of C1. The anterior arch of C1 is remarkably thin, likely secondary to chronic degenerative changes and pannus at the atlantoaxial articulation. There is approximately 7.5 mm of separation between the left and right sides of the anterior arch of C1.  The fracture through the posterior arch of C1 is on the left side, and is approximately 1.3 mm displaced. No other acute displaced fracture of the cervical spine is noted. There is some dystrophic calcification in the soft tissues posterior to the lamina at C3, presumably related to remote trauma. 3 mm of anterolisthesis of C4 upon C5. Alignment is otherwise anatomic. Prevertebral soft tissues are normal. Severe multilevel degenerative disc disease throughout the cervical spine, most pronounced at C4-C5, C5-C6 and C6-C7. Multilevel facet arthropathy. Visualized portions of the upper thorax demonstrate emphysematous changes and some septal thickening. IMPRESSION: 1. Acute displaced fractures through the anterior and posterior arches of C1, as discussed above. 2. Displaced right orbital floor fracture. At this time there is some downward herniation of extraconal fat, but no entrapment of extraocular muscles. Large amount of hemosinus in the right maxillary sinus. 3. Right globe proptosis. 4. Large hematoma in the subcutaneous fat in the right infraorbital region. 5. Nondisplaced nasal bone fractures bilaterally. 6. No other skull fractures or signs of significant acute intracranial trauma. 7. Mild cerebral atrophy with chronic microvascular ischemic changes in the cerebral white matter, as above. 8. Multilevel degenerative disc disease and cervical spondylosis, as above. These results were called by telephone at the time of interpretation on 10/22/2015  at 2:06 pm to Dr. Doug Sou, who verbally acknowledged these results. Electronically Signed   By: Trudie Reed M.D.   On: 10/12/2015 14:08    Anti-infectives: Anti-infectives    Start     Dose/Rate Route Frequency Ordered Stop   10/24/15 1300  vancomycin (VANCOCIN) 500 mg in sodium chloride 0.9 % 100 mL IVPB     500 mg 100 mL/hr over 60 Minutes Intravenous Every 24 hours 10/16/2015 1205     10/30/2015 1830  piperacillin-tazobactam (ZOSYN) IVPB 3.375 g     3.375 g 12.5 mL/hr over 240 Minutes Intravenous Every 8 hours 11/03/2015 1205     11/04/2015 1200  piperacillin-tazobactam (ZOSYN) IVPB 3.375 g     3.375 g 100 mL/hr over 30 Minutes Intravenous  Once 10/11/2015 1159 10/25/2015 1312   10/09/2015 1200  vancomycin (VANCOCIN) IVPB 1000 mg/200 mL premix     1,000 mg 200 mL/hr over 60 Minutes Intravenous  Once 10/24/2015 1159 10/22/2015 1421      Assessment/Plan:   Ground-level fall. C1 fracture-acute minimally displaced fracture through anterior posterior arches of C1.  Will check with Dr. pulled to see if can transition to soft collar. Displaced right orbital floor fracture  with blood in right maxillary sinus.  Dr. Ulice Bold to evaluate today. Nondisplaced nasal bone fracture bilaterally Periorbital laceration, repaired by Dr. Alden Hipp Multilevel degenerative disc disease and cervical spondylosis. Right inferior pubic ramus fracture, minimally displaced No evidence of thoracic or abdominal visceral injury  CAD.  Status post CABG. Cardiomyopathy with EF 30% Systolic congestive heart failure Atrial fibrillation.  No anticoagulation due to fall risk Status post left total hip replacement Spinal stenosis, chronic   PT consult Case management consult.  Suspect she will eventually transition back to SNF or assisted living Palliative care consult Lovenox and SCDs Incentive spirometry   LOS: 1 day    Yuliet Needs M 10/24/2015

## 2015-10-24 NOTE — Progress Notes (Signed)
PT Cancellation Note  Patient Details Name: Julia Black MRN: [id number] DOB: 02/06/1919   Cancelled Treatment:    Reason Eval/Treat Not Completed: Medical issues which prohibited therapy   Spoke with Charge Nurse, who informed me that Julia Black's O2 sats are low, and they are looking to transfer her to Step-down or ICU;   Will hold PT eval for now, and follow up tomorrow; Thanks,   Van ClinesHolly Makaila Windle, PT  Acute Rehabilitation Services Pager [phone] Office [phone]    Van ClinesGarrigan, Nancie Bocanegra [facility] Hospitalamff 10/24/2015, 10:45 AM

## 2015-10-25 ENCOUNTER — Inpatient Hospital Stay (HOSPITAL_COMMUNITY): Payer: Medicare Other

## 2015-10-25 DIAGNOSIS — J9811 Atelectasis: Secondary | ICD-10-CM

## 2015-10-25 LAB — BASIC METABOLIC PANEL
Anion gap: 7 (ref 5–15)
BUN: 16 mg/dL (ref 6–20)
CHLORIDE: 106 mmol/L (ref 101–111)
CO2: 34 mmol/L — AB (ref 22–32)
CREATININE: 0.88 mg/dL (ref 0.44–1.00)
Calcium: 8.5 mg/dL — ABNORMAL LOW (ref 8.9–10.3)
GFR calc Af Amer: >60 mL/min (ref >60)
GFR calc non Af Amer: 54 mL/min — ABNORMAL LOW (ref >60)
Glucose, Bld: 106 mg/dL — ABNORMAL HIGH (ref 65–99)
Potassium: 4 mmol/L (ref 3.5–5.1)
Sodium: 147 mmol/L — ABNORMAL HIGH (ref 135–145)

## 2015-10-25 LAB — CBC
HEMATOCRIT: 37.5 % (ref 36.0–46.0)
HEMOGLOBIN: 10.9 g/dL — AB (ref 12.0–15.0)
MCH: 27.5 pg (ref 26.0–34.0)
MCHC: 29.1 g/dL — AB (ref 30.0–36.0)
MCV: 94.5 fL (ref 78.0–100.0)
Platelets: 162 10*3/uL (ref 150–400)
RBC: 3.97 MIL/uL (ref 3.87–5.11)
RDW: 15 % (ref 11.5–15.5)
WBC: 7.4 10*3/uL (ref 4.0–10.5)

## 2015-10-25 MED ORDER — BACITRACIN ZINC 500 UNIT/GM EX OINT
TOPICAL_OINTMENT | Freq: Two times a day (BID) | CUTANEOUS | Status: DC
  Administered 2015-10-25 (×2): via TOPICAL
  Filled 2015-10-25: qty 28.35

## 2015-10-25 MED ORDER — TRAMADOL HCL 50 MG PO TABS
100.0000 mg | ORAL_TABLET | Freq: Four times a day (QID) | ORAL | Status: DC
  Administered 2015-10-25 (×2): 100 mg via ORAL
  Filled 2015-10-25 (×4): qty 2

## 2015-10-25 MED ORDER — HYDROCODONE-ACETAMINOPHEN 10-325 MG PO TABS
0.5000 | ORAL_TABLET | ORAL | Status: DC | PRN
  Administered 2015-10-25 (×2): 2 via ORAL
  Administered 2015-10-26 (×2): 1 via ORAL
  Filled 2015-10-25: qty 2
  Filled 2015-10-25 (×2): qty 1
  Filled 2015-10-25: qty 2

## 2015-10-25 MED ORDER — NAPROXEN 250 MG PO TABS
500.0000 mg | ORAL_TABLET | Freq: Two times a day (BID) | ORAL | Status: DC
  Administered 2015-10-25 (×2): 500 mg via ORAL
  Filled 2015-10-25 (×2): qty 2

## 2015-10-25 MED ORDER — ENSURE ENLIVE PO LIQD
237.0000 mL | Freq: Three times a day (TID) | ORAL | Status: DC
  Administered 2015-10-25 (×3): 237 mL via ORAL

## 2015-10-25 NOTE — Progress Notes (Signed)
Initial Nutrition Assessment  DOCUMENTATION CODES:   Non-severe (moderate) malnutrition in context of social or environmental circumstances  INTERVENTION:    Ensure Enlive PO TID, each supplement provides 350 kcal and 20 grams of protein  NUTRITION DIAGNOSIS:   Malnutrition related to social / environmental circumstances (advanced age) as evidenced by severe depletion of muscle mass, mild depletion of body fat, moderate depletion of body fat.  GOAL:   Patient will meet greater than or equal to 90% of their needs  MONITOR:   PO intake, Supplement acceptance, Diet advancement, Skin, I & O's  REASON FOR ASSESSMENT:   Other (Comment) (RN reports poor intake, patient drinks Ensure at home)    ASSESSMENT:   82 year old female who fell 4-15 while on route to the bathroom. She was found on her right side with the bloody face after being down for an unknown period of time. She has generalized facial bruising fractured nose, pelvic fracture, and CT of the face reveals a blowout fracture of the right orbit. She also has a non-displaced cervical fracture and is currently wearing a hard collar.  Labs reviewed: sodium elevated. Patient sleeping during RD visit. Granddaughter in room reports patient has not eaten much since her fall. She drinks Ensure at home (ALF). Diet has been advanced to full liquids. Nutrition-Focused physical exam completed. Findings are mild-moderate fat depletion, severe muscle depletion, and mild edema. Patient with moderate PCM.    Diet Order:  Diet full liquid Room service appropriate?: Yes; Fluid consistency:: Thin  Skin:  Wound (see comment) (multiple facial lacerations )  Last BM:  4/14  Height:   Ht Readings from Last 1 Encounters:  10/24/15 5\' 3"  (1.6 m)    Weight:   Wt Readings from Last 1 Encounters:  10/24/15 113 lb 5.1 oz (51.4 kg)    Ideal Body Weight:  52.3 kg  BMI:  Body mass index is 20.08 kg/(m^2).  Estimated Nutritional Needs:    Kcal:  1300-1500  Protein:  70-80 gm  Fluid:  >/= 1.5 L  EDUCATION NEEDS:   No education needs identified at this time  Joaquin CourtsKimberly Harris, RD, LDN, CNSC Pager [phone] After Hours Pager [phone]

## 2015-10-25 NOTE — Evaluation (Signed)
Occupational Therapy Evaluation and Discharge Patient Details Name: Julia Black MRN: [id number] DOB: 1918-09-28 Today's Date: 10/25/2015    History of Present Illness HPI: 7 yof who has history of a couple falls and uses walker at baseline presents after fall in bathroom sometime last night.Right orbital fx, C1 fracture, pelvic fx (all not requiring sx).She was found in hall trying to reach her call bell she lives in independent living.  has a past medical history of SPINAL STENOSIS; LOW BACK PAIN; FRACTURE, PELVIS, RIGHT (12/2009); HIP FRACTURE, LEFT (09/2009); ; Hypertension; HYPERLIPIDEMIA;  Arthritis; Osteoporosis; Atrial fibrillation (HCC); DEPRESSION; CORONARY ARTERY DISEASE (2004); Systolic congestive heart failure (HCC); Cardiomyopathy (HCC);  Carotid artery occlusion; and Anemia.   Clinical Impression   This 82 yo female admitted for above presents to acute OT with deficits below ( see OT problem list) affecting her PLOF of independent with some basic ADLs. She will benefit from continued OT at SNF, acute OT will sign off.    Follow Up Recommendations  SNF    Equipment Recommendations   (TBD at next venue)       Precautions / Restrictions Precautions Precautions: Cervical;Fall Required Braces or Orthoses: Cervical Brace Cervical Brace: Hard collar;At all times Restrictions Weight Bearing Restrictions: No      Mobility Bed Mobility Overal bed mobility: Needs Assistance Bed Mobility: Sit to Supine       Sit to supine: Max assist      Transfers Overall transfer level: Needs assistance Equipment used:  (therapist in front with gait belt) Transfers: Sit to/from [employer] Transfers Sit to Stand: Mod assist Stand pivot transfers: Mod assist            Balance Overall balance assessment: Needs assistance Sitting-balance support: Feet supported;No upper extremity supported Sitting balance-Leahy Scale: Good     Standing balance support: Bilateral upper  extremity supported;During functional activity Standing balance-Leahy Scale: Poor Standing balance comment: reliant on having Bil UEs supported                            ADL Overall ADL's : Needs assistance/impaired Eating/Feeding: Maximal assistance (due to c-collar and facial fxs)   Grooming: Maximal assistance;Sitting (supported)   Upper Body Bathing: Moderate assistance;Sitting (supported)   Lower Body Bathing: Total assistance (mod A sit<>stand)   Upper Body Dressing : Total assistance;Sitting (supported)   Lower Body Dressing: Total assistance (mod A sit<>stand)   Toilet Transfer: Moderate assistance;Stand-pivot (recliner>bed going to her right)   Toileting- Clothing Manipulation and Hygiene: Total assistance (mod A sit<>stand (one to stand one to clean/clothing))                         Pertinent Vitals/Pain Pain Assessment: No/denies pain     Hand Dominance Right   Extremity/Trunk Assessment Upper Extremity Assessment Upper Extremity Assessment: Generalized weakness           Communication Communication Communication: HOH   Cognition Arousal/Alertness: Awake/alert Behavior During Therapy: WFL for tasks assessed/performed Overall Cognitive Status: Within Functional Limits for tasks assessed                                Home Living Family/patient expects to be discharged to:: Skilled nursing facility  Prior Functioning/Environment Level of Independence: Needs assistance  Gait / Transfers Assistance Needed: Mod I with RW ADL's / Homemaking Assistance Needed: Had A for all B/D; could toliet herself and undress herself at night   Comments: takes meals in dining room 1x/day, ambulates with 4RW    OT Diagnosis: Generalized weakness   OT Problem List: Decreased strength;Decreased range of motion;Impaired balance (sitting and/or standing);Decreased knowledge of use of  DME or AE;Impaired vision/perception   OT Treatment/Interventions:      OT Goals(Current goals can be found in the care plan section) Acute Rehab OT Goals Patient Stated Goal: family (to SNF then ALF at same facility)  OT Frequency:                End of Session Equipment Utilized During Treatment: Gait belt Nurse Communication:  (Pt back in bed)  Activity Tolerance: Patient tolerated treatment well Patient left: in bed;with call bell/phone within reach;with family/visitor present;with bed alarm set   Time: 1433-1500 OT Time Calculation (min): 27 min Charges:  OT General Charges $OT Visit: 1 Procedure OT Evaluation $OT Eval Moderate Complexity: 1 Procedure OT Treatments $Self Care/Home Management : 8-22 mins  Evette GeorgesLeonard, Bethenny Losee Eva [phone] 10/25/2015, 3:48 PM

## 2015-10-25 NOTE — Progress Notes (Signed)
Name: Julia Black MRN: [id number] DOB: 1919/06/22    ADMISSION DATE:  10/30/2015 CONSULTATION DATE:  4/16  REFERRING MD : Trauma  CHIEF COMPLAINT: Hypoxia   SUBJECTIVE: Sleeping in recliner. Per family at bedside, she has been sleeping a lot. Has tolerated nasal cannula, currently on 6L at 100%, unlabored breathing. Vanc/Zosyn d/c'd.  VITAL SIGNS: Temp:  [97.4 F (36.3 C)-98.6 F (37 C)] 98.2 F (36.8 C) (04/17 1153) Pulse Rate:  [64-98] 64 (04/17 1153) Resp:  [11-18] 18 (04/17 1153) BP: (93-138)/(41-59) 93/44 mmHg (04/17 1153) SpO2:  [100 %] 100 % (04/17 1153)  PHYSICAL EXAMINATION: General:  Frail, elderly female asleep in the recliner Neuro:  Awakens to voice, oriented to person / place, moves all extremities HEENT:  Pupils 3mm equal / reactive, sutured lac R eyebrow, diffuse facial ecchymosis, neck collar in place Cardiovascular:  S1S2 rrr, bilateral pulses +2 Lungs: non-labored, diminished bases, diffuse crackles  Abdomen: +bs, soft Musculoskeletal:  Intact, no acute deformities Skin:  Warm and dry, no edema    Recent Labs Lab 11/01/2015 1054 10/24/15 0353 10/25/15 0405  NA 143 144 147*  K 3.4* 3.6 4.0  CL 100* 105 106  CO2 31 31 34*  BUN 26* 19 16  CREATININE 0.66 0.72 0.88  GLUCOSE 116* 90 106*    Recent Labs Lab 11/04/2015 1054 10/24/15 0353 10/25/15 0405  HGB 11.1* 9.9* 10.9*  HCT 35.8* 32.8* 37.5  WBC 8.5 6.5 7.4  PLT 143* 150 162   Dg Chest Port 1 View  10/24/2015  CLINICAL DATA:  82 year old female with oxygen desaturations. Former smoker. Hypertension. EXAM: PORTABLE CHEST 1 VIEW COMPARISON:  Chest x-ray 10/31/2015. FINDINGS: Mild diffuse peribronchial cuffing. Bibasilar opacities favored to reflect areas of subsegmental atelectasis and/or scarring. However, there is also an ill-defined opacity in the left lower lobe the could reflect airspace consolidation. No definite pleural effusions. No evidence of pulmonary edema. Mild cardiomegaly. Upper  mediastinal contours are within normal limits. Atherosclerosis in the thoracic aorta. Status post median sternotomy for CABG. IMPRESSION: 1. Bibasilar opacities favored to predominantly reflect areas of subsegmental atelectasis and/or scarring. Additional focus of potential airspace consolidation in the left lower lobe. 2. Diffuse peribronchial cuffing, which could indicate a acute or chronic bronchitis. 3. Atherosclerosis. 4. Mild cardiomegaly. Electronically Signed   By: Trudie Reed M.D.   On: 10/24/2015 11:05    SIGNIFICANT EVENTS  4/15  Admit after mechanical fall  STUDIES:  CT Cervical spine 11/06/2015 >> Acute displaced fractures through the anterior and posterior arches of C1; multilevel degenerative disc disease and cervical spondylosis CT Head 10/22/2015 >> mild cerebral atrophy CT Maxillofacial >> displaced right orbital floor fracture, large amount of hemosinus in right maxillary sinus; right globe proptosis; nondisplaced nasal bone fractures bilaterally Port CXR 4/17 >> bibasilar opacities, potential airspace consolidation in left lower lobe; diffuse peribronchial cuffing; atherosclerosis; mild cardiomegaly  ANTIBIOTICS:  Vancomycin 4/15 >> 4/15 Zosyn 4/15 >> 4/17  CULTURES: Blood cultures x 2 4/15 >> Urine culture 4/15 >> negative   ASSESSMENT: Hypoxia (HCC) secondary to facial trauma Atelectasis  Facial trauma with right orbital blow out fx. Plastic and Opthalmolgy following C1 fx neuro surgery following, in cervical collar Mechanical Fall  Hx Frequent Falls Coronary atherosclerosis GERD Spinal stenosis Low Back Pain  Diastolic dysfunction with heart failure [facility])     Discussion: 82 yo wf with a history of falls, uses walker who fell 4/15 while in route to bathroom. Found on right side with bloody face. [facility] CT face reveals blow out fx of the rt orbit.  CT neck revealed c1 non displaced fx treated with collar. She has extensive cardiac hx and is  [facility]. 4/16 O2 sats were noted to be low and PCCM was consulted. Upon assessment, changed her from nasal cannula to oral O2 with sats promptly increased to 96% on 5 l/m flow.    PLAN: Change to face tent with humidity for O2 or VM for O2 delivery Wean O2 for sats > 95 % Pulmonary hygiene >> incentive spirometry Q2 while awake Monitor off abx  Minimize sedating medications as able  Intermittent CXR  Aspiration precautions    Canary BrimBrandi Husein Guedes, NP-C [facility] Pgr: [phone] or if no answer [phone] 10/25/2015, 4:06 PM

## 2015-10-25 NOTE — NC FL2 (Signed)
[place] MEDICAID FL2 LEVEL OF CARE SCREENING TOOL     IDENTIFICATION  Patient Name: Julia Black Birthdate: 1919-03-27 Sex: female Admission Date (Current Location): 11/20/2015  [facility] and [place] Number:  Producer, television/film/video and Address:  [facility] [place], [address] [place], [place] [address]      Provider Number: [id number]  Attending Physician Name and Address:  Trauma Md, MD  Relative Name and Phone Number:       Current Level of Care: Hospital Recommended Level of Care: Skilled Nursing Facility Prior Approval Number:    Date Approved/Denied:   PASRR Number: [id number]  Discharge Plan: SNF    Current Diagnoses: Patient Active Problem List   Diagnosis Date Noted  . Frequent falls 10/24/2015  .  hypoxia (HCC) 10/24/2015  . Facial trauma with right orbital blow out fx. Plastic and Opthalmolgy following 10/24/2015  . Palliative care encounter   . Fall Nov 20, 2015  . Wound of right leg 08/27/2015  . Loose stools 05/25/2015  . Pain in the chest 05/25/2015  . Leg edema, left 05/25/2015  . Lump of skin 05/05/2015  . Constipation 05/05/2015  . Diastolic dysfunction with heart failure (HCC) 05/05/2015  . Hyperglycemia 06/10/2014  . NSTEMI (non-ST elevated myocardial infarction) (HCC)   . Cerebrovascular disease 12/11/2013  . Knee osteoarthritis   . Macular degeneration 06/28/2011  . Atrial fibrillation (HCC) 09/27/2010  . LOW BACK PAIN 03/17/2010  . EDEMA 01/17/2010  . FRACTURE, PELVIS, RIGHT 01/03/2010  . HIP FRACTURE, LEFT 10/03/2009  . DEPRESSION 07/15/2009  . ALLERGIC RHINITIS 07/15/2009  . GERD 07/15/2009  . HYPERLIPIDEMIA 07/14/2009  . Essential hypertension 07/14/2009  . Coronary atherosclerosis 07/14/2009  . Spinal stenosis 07/14/2009    Orientation RESPIRATION BLADDER Height & Weight     Self, Time, Situation, Place  O2 (6L with plans to wean prior to discharge) Continent Weight: 113 lb 5.1 oz (51.4 kg) Height:    (160 cm)  BEHAVIORAL SYMPTOMS/MOOD NEUROLOGICAL BOWEL NUTRITION STATUS      Continent Diet (Full Liquid with plans to advance)  AMBULATORY STATUS COMMUNICATION OF NEEDS Skin   Extensive Assist Verbally Skin abrasions (Right Eye and Lip Laceration with sutures)                       Personal Care Assistance Level of Assistance  Bathing, Feeding, Dressing Bathing Assistance: Maximum assistance Feeding assistance: Limited assistance Dressing Assistance: Maximum assistance     Functional Limitations Info  Sight, Hearing, Speech Sight Info: Adequate Hearing Info: Adequate Speech Info: Adequate    SPECIAL CARE FACTORS FREQUENCY  PT (By licensed PT), OT (By licensed OT)     PT Frequency: 3 OT Frequency: 3            Contractures Contractures Info: Not present    Additional Factors Info  Code Status, Allergies Code Status Info: DNR Allergies Info: Amoxicillin, Sporanox, Sulfa Antibiotics           Current Medications (10/25/2015):  This is the current hospital active medication list Current Facility-Administered Medications  Medication Dose Route Frequency Provider Last Rate Last Dose  . 0.9 %  sodium chloride infusion   Intravenous Continuous Emelia Loron, MD 40 mL/hr at 10/24/15 1700    . acetaminophen (TYLENOL) tablet 650 mg  650 mg Oral Q4H PRN Emelia Loron, MD      . bacitracin ointment   Topical BID Freeman Caldron, PA-C      .  bisacodyl (DULCOLAX) suppository 10 mg  10 mg Rectal Daily PRN Emelia LoronMatthew Wakefield, MD      . docusate sodium (COLACE) capsule 100 mg  100 mg Oral BID Emelia LoronMatthew Wakefield, MD   100 mg at 10/25/15 1034  . enoxaparin (LOVENOX) injection 40 mg  40 mg Subcutaneous Q24H Emelia LoronMatthew Wakefield, MD   40 mg at 10/24/15 2153  . feeding supplement (ENSURE ENLIVE) (ENSURE ENLIVE) liquid 237 mL  237 mL Oral TID BM Freeman CaldronMichael J Jeffery, PA-C      . fentaNYL (DURAGESIC - dosed mcg/hr) 50 mcg  50 mcg Transdermal Q72H Emelia LoronMatthew Wakefield, MD   50 mcg at  11/07/2015 2133  . furosemide (LASIX) tablet 20 mg  20 mg Oral Daily Emelia LoronMatthew Wakefield, MD   20 mg at 10/25/15 1035  . HYDROcodone-acetaminophen (NORCO) 10-325 MG per tablet 0.5-2 tablet  0.5-2 tablet Oral Q4H PRN Freeman CaldronMichael J Jeffery, PA-C      . metoprolol tartrate (LOPRESSOR) tablet 12.5 mg  12.5 mg Oral BID Emelia LoronMatthew Wakefield, MD   12.5 mg at 10/25/15 1034  . morphine 2 MG/ML injection 2 mg  2 mg Intravenous Q2H PRN Emelia LoronMatthew Wakefield, MD   2 mg at 10/24/15 [id number]  . naproxen (NAPROSYN) tablet 500 mg  500 mg Oral BID WC Freeman CaldronMichael J Jeffery, PA-C   500 mg at 10/25/15 1034  . nitroGLYCERIN (NITROSTAT) SL tablet 0.4 mg  0.4 mg Sublingual Q5 min PRN Emelia LoronMatthew Wakefield, MD      . ondansetron [facility](ZOFRAN) tablet 4 mg  4 mg Oral Q6H PRN Emelia LoronMatthew Wakefield, MD       Or  . ondansetron [facility](ZOFRAN) injection 4 mg  4 mg Intravenous Q6H PRN Emelia LoronMatthew Wakefield, MD   4 mg at 10/24/15 0724  . pantoprazole (PROTONIX) EC tablet 40 mg  40 mg Oral Daily Axel FillerArmando Ramirez, MD   40 mg at 10/25/15 1034  . potassium chloride SA (K-DUR,KLOR-CON) CR tablet 20 mEq  20 mEq Oral Daily Emelia LoronMatthew Wakefield, MD   20 mEq at 10/25/15 1035  . pregabalin (LYRICA) capsule 50 mg  50 mg Oral TID Emelia LoronMatthew Wakefield, MD   50 mg at 10/25/15 1034  . senna (SENOKOT) tablet 17.2 mg  2 tablet Oral QHS Emelia LoronMatthew Wakefield, MD   17.2 mg at 10/24/15 2152  . traMADol (ULTRAM) tablet 100 mg  100 mg Oral 4 times per day Freeman CaldronMichael J Jeffery, PA-C      . venlafaxine XR (EFFEXOR-XR) 24 hr capsule 75 mg  75 mg Oral Q breakfast Emelia LoronMatthew Wakefield, MD   75 mg at 10/25/15 1034     Discharge Medications: Please see discharge summary for a list of discharge medications.  Relevant Imaging Results:  Relevant Lab Results:   Additional Information SSN [id number]  Macario GoldsJesse Bonifacio [place], [place] [phone]

## 2015-10-25 NOTE — Clinical Social Work Note (Signed)
Clinical Social Work Assessment  Patient Details  Name: Julia Black MRN: [id number] Date of Birth: 12/10/1918  Date of referral:  10/24/15               Reason for consult:  Facility Placement, Trauma                Permission sought to share information with:  Family Supports Permission granted to share information::  Yes, Verbal Permission Granted  Name::     Julia Black  Relationship::  Daughter Julia Black)  Contact Information:  [phone]  Housing/Transportation Living arrangements for the past 2 months:  [place] ([place]) Source of Information:  Adult Children Patient Interpreter Needed:  None Criminal Activity/Legal Involvement Pertinent to Current Situation/Hospitalization:  No - Comment as needed Significant Relationships:  Adult Children, Other Family Members Lives with:  Facility Resident, Self Do you feel safe going back to the place where you live?  No Need for family participation in patient care:  Yes (Comment)  Care giving concerns:  Patient adult children very clearly state that patient will not be able to return to [place] independently.  Patient has been to [facility] in the past and family is not opposed to return, however would like to explore all options.  Patient family expresses concerns regarding the potential need for long term placement either at SNF or ALF following rehab stay.  Brief conversation had, about obtaining Medicaid and the process when that time comes.   Social Worker assessment / plan:  Holiday representative met with patient family in 3S waiting area to offer support and discuss patient needs at discharge.  Patient children state that patient has been independent, with the use of a walker, for many years.  Patient family has been discussing moving patient to a higher level of care prior to the fall.  Patient family remains very involved in patient care.  CSW provided patient family with a list of facilities in the area  and they plan to do some research prior to patient discharge.  Patient family provided with necessary resources for short term and long term placement.  CSW to complete FL2 and initiate bed search once patient is more medically appropriate.  CSW remains available for support and to facilitate patient discharge needs once medically stable.  Employment status:  Retired Forensic scientist:  Medicare PT Recommendations:  Not assessed at this time [place] / Referral to community resources:  [place]  Patient/Family's Response to care:  Patient family verbalized understanding and appreciation for CSW concern and involvement.  Patient family aware of CSW role and the process for SNF placement.  Patient family very receptive and agreeable with placement for patient at this time.  Patient/Family's Understanding of and Emotional Response to Diagnosis, Current Treatment, and Prognosis:  Patient family is appropriately concerned and seeking what is in the best interest of the patient.  Patient family provide a good understanding of patient deficits and the need for a higher level of care.  Emotional Assessment Appearance:  Appears younger than stated age Attitude/Demeanor/Rapport:  Other (Patient transferring from the floor to SDU) Affect (typically observed):  Other (Patient transferring from floor to SDU) Orientation:  Oriented to Self, Oriented to Place, Oriented to  Time, Oriented to Situation Alcohol / Substance use:  Never Used Psych involvement (Current and /or in the community):  No (Comment)  Discharge Needs  Concerns to be addressed:  Discharge Planning Concerns, Adjustment to Illness, Care Coordination Readmission within the  last 30 days:  No Current discharge risk:  Physical Impairment Barriers to Discharge:  Continued Medical Work up  [employer], [place]

## 2015-10-25 NOTE — Evaluation (Signed)
Physical Therapy Evaluation Patient Details Name: TOMICKA LOVER MRN: [id number] DOB: 05/28/1919 Today's Date: 10/25/2015   History of Present Illness  HPI: 6 yof who has history of a couple falls and uses walker at baseline presents after fall in bathroom sometime last night. She was found in hall trying to reach her call bell she lives in independent living.  has a past medical history of SPINAL STENOSIS; LOW BACK PAIN; FRACTURE, PELVIS, RIGHT (12/2009); HIP FRACTURE, LEFT (09/2009); ; Hypertension; HYPERLIPIDEMIA;  Arthritis; Osteoporosis; Atrial fibrillation (HCC); DEPRESSION; CORONARY ARTERY DISEASE (2004); Systolic congestive heart failure (HCC); Cardiomyopathy (HCC);  Carotid artery occlusion; and Anemia.  Clinical Impression  Pt admitted with above diagnosis. Pt currently with functional limitations due to the deficits listed below (see PT Problem List). On eval, pt required mod assist for supine to sit and +2 mod assist for transfers.  Pt will benefit from skilled PT to increase their independence and safety with mobility to allow discharge to the venue listed below.  Pt appears motivated to regain her mobility.    Follow Up Recommendations SNF;Supervision/Assistance - 24 hour    Equipment Recommendations  Wheelchair (measurements PT);Wheelchair cushion (measurements PT)    Recommendations for Other Services       Precautions / Restrictions Precautions Precautions: Cervical;Fall Required Braces or Orthoses: Cervical Brace Cervical Brace: Hard collar;At all times Restrictions Weight Bearing Restrictions: Yes      Mobility  Bed Mobility Overal bed mobility: Needs Assistance Bed Mobility: Supine to Sit     Supine to sit: Mod assist     General bed mobility comments: verbal cues for sequencing, +rail  Transfers Overall transfer level: Needs assistance Equipment used: 2 person hand held assist Transfers: Sit to/from Stand;Stand Pivot Transfers Sit to Stand: +2  safety/equipment;Mod assist Stand pivot transfers: Mod assist;+2 safety/equipment       General transfer comment: verbal cues for sequencing, pivoted toward right  Ambulation/Gait                Stairs            Wheelchair Mobility    Modified Rankin (Stroke Patients Only)       Balance Overall balance assessment: Needs assistance Sitting-balance support: No upper extremity supported Sitting balance-Leahy Scale: Good Sitting balance - Comments: Pt sat EOB with min guard assist to drink water.   Standing balance support: Bilateral upper extremity supported;During functional activity Standing balance-Leahy Scale: Poor                               Pertinent Vitals/Pain Pain Assessment: Faces Faces Pain Scale: Hurts little more Pain Location: neck Pain Descriptors / Indicators: Grimacing;Guarding Pain Intervention(s): Monitored during session;Repositioned    Home Living Family/patient expects to be discharged to:: Private residence Living Arrangements: Alone Available Help at Discharge: Available PRN/intermittently;Family Type of Home: Independent living facility Home Access: Level entry     Home Layout: One level Home Equipment: Walker - 4 wheels;Grab bars - tub/shower;Bedside commode      Prior Function Level of Independence: Independent with assistive device(s)         Comments: takes meals in dining room 1x/day, ambulates with 4WW     Hand Dominance   Dominant Hand: Right    Extremity/Trunk Assessment   Upper Extremity Assessment: Defer to OT evaluation           Lower Extremity Assessment: Generalized weakness;RLE deficits/detail;LLE deficits/detail RLE Deficits / Details:  pelvic fx    Cervical / Trunk Assessment: Kyphotic  Communication   Communication: HOH  Cognition Arousal/Alertness: Awake/alert Behavior During Therapy: WFL for tasks assessed/performed Overall Cognitive Status: Within Functional Limits for  tasks assessed                      General Comments      Exercises        Assessment/Plan    PT Assessment Patient needs continued PT services  PT Diagnosis Difficulty walking;Generalized weakness;Acute pain   PT Problem List Decreased strength;Decreased activity tolerance;Decreased balance;Decreased mobility;Pain  PT Treatment Interventions Gait training;Functional mobility training;Therapeutic activities;Therapeutic exercise;Patient/family education;Balance training;Neuromuscular re-education;DME instruction   PT Goals (Current goals can be found in the Care Plan section) Acute Rehab PT Goals Patient Stated Goal: not stated PT Goal Formulation: With patient/family Time For Goal Achievement: 11/08/15 Potential to Achieve Goals: Fair    Frequency Min 3X/week   Barriers to discharge        Co-evaluation               End of Session Equipment Utilized During Treatment: Gait belt;Cervical collar Activity Tolerance: Patient tolerated treatment well Patient left: in chair;with call bell/phone within reach;with chair alarm set;with family/visitor present Nurse Communication: Mobility status         Time: [id number] PT Time Calculation (min) (ACUTE ONLY): 24 min   Charges:   PT Evaluation $PT Eval Moderate Complexity: 1 Procedure PT Treatments $Therapeutic Activity: 8-22 mins   PT G Codes:        Ilda FoilGarrow, Carrell Palmatier Rene 10/25/2015, 11:06 AM

## 2015-10-25 NOTE — Consult Note (Signed)
Reason for Consult: C1 fracture Referring Physician: Trauma  Julia Black is an 82 y.o. female.  HPI: 82 year old female with history of fall striking her left face. Workup demonstrates evidence of a nondisplaced anterior arch fracture C1. No neurologic deficits.  Past Medical History  Diagnosis Date  . SPINAL STENOSIS   . LOW BACK PAIN     chronic, follows with Nsurg for ESI  . FRACTURE, PELVIS, RIGHT 12/2009  . HIP FRACTURE, LEFT 09/2009    s/p L THA  . ALLERGIC RHINITIS   . Carpal tunnel syndrome   . Hypertension   . HYPERLIPIDEMIA   . GERD   . Arthritis   . Osteoporosis   . Atrial fibrillation ([place])     no anticoag due to fall risk/age  . DEPRESSION   . CORONARY ARTERY DISEASE 2004    a.  LHC (01/2003):  3 v CAD => s/p CABG.;   b.  Myoview (02/2013):  Anterior scar; no ischemia; EF 49%.    . Systolic congestive heart failure ([place])   . Cardiomyopathy ([place])     probable [place] CM - a. Echo (02/2013):  EF 25-30%.;   b.  Echo (04/04/13):  Mild focal basal septal hypertrophy, EF 60-65%, no RWMA, Gr 2 DD, mild to mod MAC, mild bileaflet MVP, mild to mod MR, mild to mod LAE, mild RAE, PASP 48.  [name] H/O Doppler ultrasound     Renal Artery [place] (09/2010):  No RAS.  . Carotid artery occlusion     Carotid US (05/2010):  RICA 37-10%; LICA 6-26%  . Anemia     Past Surgical History  Procedure Laterality Date  . (l) hip hemiarthroplasty  09/2009  . Appendectomy  1940  . Eye surgery      both cataracts  . Coronary artery bypass graft  2004  . Carpal tunnel release  2004    right and left-dsc  . Ulnar nerve transposition  2009    lt   . Vein ligation      legs  . Colonoscopy    . I&d extremity Right 12/26/2012    Procedure: IRRIGATION AND DEBRIDEMENT OF RIGHT LEG, SURGICAL PREP PLACEMENT OF A CELL AND VAC   ;  Surgeon: Theodoro Kos, DO;  Location: [facility];  Service: Plastics;  Laterality: Right;    Family History  Problem Relation Age of Onset  . Arthritis Mother    . Breast cancer Mother   . Heart disease Mother   . Hypertension Mother   . Arthritis Father   . Heart disease Father   . Hypertension Father   . Arthritis Maternal Grandmother   . Arthritis Maternal Grandfather   . Arthritis Paternal Grandmother   . Arthritis Paternal Grandfather   . Breast cancer Daughter   . Cancer Mother 22  . Emphysema Father 72  . Heart attack Brother 51    Social History:  reports that she has quit smoking. Her smoking use included Cigarettes. She has never used smokeless tobacco. She reports that she does not drink alcohol or use illicit drugs.  Allergies:  Allergies  Allergen Reactions  . Amoxicillin Nausea Only    Upset stomach with high dose  . Sporanox [Itraconazole] Hives  . Sulfa Antibiotics Nausea Only    Medications: I have reviewed the patient's current medications.  Results for orders placed or performed during the hospital encounter of 10/16/2015 (from the past 48 hour(s))  CBC     Status: Abnormal   Collection  Time: 10/24/15  3:53 AM  Result Value Ref Range   WBC 6.5 4.0 - 10.5 K/uL   RBC 3.66 (L) 3.87 - 5.11 MIL/uL   Hemoglobin 9.9 (L) 12.0 - 15.0 g/dL   HCT 32.8 (L) 36.0 - 46.0 %   MCV 89.6 78.0 - 100.0 fL   MCH 27.0 26.0 - 34.0 pg   MCHC 30.2 30.0 - 36.0 g/dL   RDW 14.9 11.5 - 15.5 %   Platelets 150 150 - 400 K/uL  Basic metabolic panel     Status: Abnormal   Collection Time: 10/24/15  3:53 AM  Result Value Ref Range   Sodium 144 135 - 145 mmol/L   Potassium 3.6 3.5 - 5.1 mmol/L   Chloride 105 101 - 111 mmol/L   CO2 31 22 - 32 mmol/L   Glucose, Bld 90 65 - 99 mg/dL   BUN 19 6 - 20 mg/dL   Creatinine, Ser 0.72 0.44 - 1.00 mg/dL   Calcium 8.3 (L) 8.9 - 10.3 mg/dL   GFR calc non Af Amer >60 >60 mL/min   GFR calc Af Amer >60 >60 mL/min    Comment: (NOTE) The eGFR has been calculated using the CKD EPI equation. This calculation has not been validated in all clinical situations. eGFR's persistently <60 mL/min signify possible  Chronic Kidney Disease.    Anion gap 8 5 - 15  MRSA PCR Screening     Status: None   Collection Time: 10/24/15  9:45 AM  Result Value Ref Range   MRSA by PCR NEGATIVE NEGATIVE    Comment:        The GeneXpert MRSA Assay (FDA approved for NASAL specimens only), is one component of a comprehensive MRSA colonization surveillance program. It is not intended to diagnose MRSA infection nor to guide or monitor treatment for MRSA infections.   CBC     Status: Abnormal   Collection Time: 10/25/15  4:05 AM  Result Value Ref Range   WBC 7.4 4.0 - 10.5 K/uL   RBC 3.97 3.87 - 5.11 MIL/uL   Hemoglobin 10.9 (L) 12.0 - 15.0 g/dL   HCT 37.5 36.0 - 46.0 %   MCV 94.5 78.0 - 100.0 fL   MCH 27.5 26.0 - 34.0 pg   MCHC 29.1 (L) 30.0 - 36.0 g/dL   RDW 15.0 11.5 - 15.5 %   Platelets 162 150 - 400 K/uL  Basic metabolic panel     Status: Abnormal   Collection Time: 10/25/15  4:05 AM  Result Value Ref Range   Sodium 147 (H) 135 - 145 mmol/L   Potassium 4.0 3.5 - 5.1 mmol/L   Chloride 106 101 - 111 mmol/L   CO2 34 (H) 22 - 32 mmol/L   Glucose, Bld 106 (H) 65 - 99 mg/dL   BUN 16 6 - 20 mg/dL   Creatinine, Ser 0.88 0.44 - 1.00 mg/dL   Calcium 8.5 (L) 8.9 - 10.3 mg/dL   GFR calc non Af Amer 54 (L) >60 mL/min   GFR calc Af Amer >60 >60 mL/min    Comment: (NOTE) The eGFR has been calculated using the CKD EPI equation. This calculation has not been validated in all clinical situations. eGFR's persistently <60 mL/min signify possible Chronic Kidney Disease.    Anion gap 7 5 - 15    Dg Chest Port 1 View  10/24/2015  CLINICAL DATA:  82 year old female with oxygen desaturations. Former smoker. Hypertension. EXAM: PORTABLE CHEST 1 VIEW COMPARISON:  Chest x-ray 10/16/2015.  FINDINGS: Mild diffuse peribronchial cuffing. Bibasilar opacities favored to reflect areas of subsegmental atelectasis and/or scarring. However, there is also an ill-defined opacity in the left lower lobe the could reflect airspace  consolidation. No definite pleural effusions. No evidence of pulmonary edema. Mild cardiomegaly. Upper mediastinal contours are within normal limits. Atherosclerosis in the thoracic aorta. Status post median sternotomy for CABG. IMPRESSION: 1. Bibasilar opacities favored to predominantly reflect areas of subsegmental atelectasis and/or scarring. Additional focus of potential airspace consolidation in the left lower lobe. 2. Diffuse peribronchial cuffing, which could indicate a acute or chronic bronchitis. 3. Atherosclerosis. 4. Mild cardiomegaly. Electronically Signed   By: Vinnie Langton M.D.   On: 10/24/2015 11:05    Pertinent items noted in HPI and remainder of comprehensive ROS otherwise negative. Blood pressure 93/44, pulse 64, temperature 98.2 F (36.8 C), temperature source Oral, resp. rate 18, height '5\' 3"'$  (1.6 m), weight 51.4 kg (113 lb 5.1 oz), SpO2 100 %. Patient is awake and alert. She is oriented and appropriate. Examination her cranial nerve function is intact. Motor and sensory function extremities intact. Examination her face demonstrates marked right facial swelling with some right proptosis. Remainder of her calvarium is intact. Oropharynx nasopharynx and external auditory canals are clear. Neck with minimal tenderness. No bony abnormality. Motor 5/5 bilaterally.  Assessment/Plan: C1 fracture. Plan immobilization for 2 months and aspirin brace. Mobilize ad lib.  Rollan Roger A 10/25/2015, 3:53 PM

## 2015-10-25 NOTE — Progress Notes (Signed)
Patient ID: Julia Black, female   DOB: 10/09/1918, 82 y.o.   MRN: [id number]   LOS: 2 days   Subjective: No new c/o. Getting ready to work with therapies.   Objective: Vital signs in last 24 hours: Temp:  [97.4 F (36.3 C)-98.6 F (37 C)] 98 F (36.7 C) (04/17 0746) Pulse Rate:  [70-98] 98 (04/17 0746) Resp:  [11-17] 12 (04/17 0746) BP: (96-138)/(41-59) 138/50 mmHg (04/17 0746) SpO2:  [85 %-100 %] 100 % (04/17 0746) Weight:  [51.4 kg (113 lb 5.1 oz)] 51.4 kg (113 lb 5.1 oz) (04/16 1411) Last BM Date: 10/22/15   Laboratory  CBC  Recent Labs  10/24/15 0353 10/25/15 0405  WBC 6.5 7.4  HGB 9.9* 10.9*  HCT 32.8* 37.5  PLT 150 162   BMET  Recent Labs  10/24/15 0353 10/25/15 0405  NA 144 147*  K 3.6 4.0  CL 105 106  CO2 31 34*  GLUCOSE 90 106*  BUN 19 16  CREATININE 0.72 0.88  CALCIUM 8.3* 8.5*    Physical Exam General appearance: alert and no distress Resp: clear to auscultation bilaterally Cardio: regular rate and rhythm with frequent premature supraventricular beats GI: normal findings: bowel sounds normal and soft, non-tender   Assessment/Plan: Ground-level fall. C1 fracture-acute minimally displaced fracture through anterior posterior arches of C1.Collar. Displaced right orbital floor fracture-- per Dr. Ulice Boldillingham Nondisplaced nasal bone fracture bilaterally Periorbital laceration, repaired by Dr. Dione BoozeGroat Multilevel degenerative disc disease and cervical spondylosis Right inferior pubic ramus fracture, minimally displaced ABL anemia -- Stable Hypoxia -- Appreciate pulmonary consult Multiple medical problems -- Home meds FEN -- D/C vanc/Zosyn, will increase Norco range, add scheduled tramadol and NSAID, watch renal fxn VTE -- SCD's, Lovenox Dispo -- PT/OT, anticipate SNF recommendation    Freeman CaldronMichael J. Cyara Devoto, PA-C Pager: [phone] General Trauma PA Pager: [phone]  10/25/2015

## 2015-10-26 ENCOUNTER — Inpatient Hospital Stay (HOSPITAL_COMMUNITY): Payer: Medicare Other

## 2015-10-26 DIAGNOSIS — Z789 Other specified health status: Secondary | ICD-10-CM

## 2015-10-26 DIAGNOSIS — R4182 Altered mental status, unspecified: Secondary | ICD-10-CM | POA: Diagnosis not present

## 2015-10-26 LAB — BASIC METABOLIC PANEL
Anion gap: 8 (ref 5–15)
BUN: 27 mg/dL — AB (ref 6–20)
CHLORIDE: 105 mmol/L (ref 101–111)
CO2: 33 mmol/L — AB (ref 22–32)
Calcium: 8.7 mg/dL — ABNORMAL LOW (ref 8.9–10.3)
Creatinine, Ser: 1.13 mg/dL — ABNORMAL HIGH (ref 0.44–1.00)
GFR calc Af Amer: 46 mL/min — ABNORMAL LOW (ref >60)
GFR calc non Af Amer: 40 mL/min — ABNORMAL LOW (ref >60)
GLUCOSE: 98 mg/dL (ref 65–99)
POTASSIUM: 4.4 mmol/L (ref 3.5–5.1)
Sodium: 146 mmol/L — ABNORMAL HIGH (ref 135–145)

## 2015-10-26 LAB — GLUCOSE, CAPILLARY: GLUCOSE-CAPILLARY: 92 mg/dL (ref 65–99)

## 2015-10-26 MED ORDER — ACETAMINOPHEN 650 MG RE SUPP: 650.0000 mg | Freq: Four times a day (QID) | RECTAL | Status: DC | PRN

## 2015-10-26 MED ORDER — BIOTENE DRY MOUTH MT LIQD: 15.0000 mL | OROMUCOSAL | Status: DC | PRN

## 2015-10-26 MED ORDER — MIDAZOLAM BOLUS VIA INFUSION (WITHDRAWAL LIFE SUSTAINING TX)
5.0000 mg | INTRAVENOUS | Status: DC | PRN
  Filled 2015-10-26: qty 20

## 2015-10-26 MED ORDER — GLYCOPYRROLATE 1 MG PO TABS
1.0000 mg | ORAL_TABLET | ORAL | Status: DC | PRN
  Filled 2015-10-26: qty 1

## 2015-10-26 MED ORDER — NALOXONE HCL 0.4 MG/ML IJ SOLN
0.4000 mg | Freq: Once | INTRAMUSCULAR | Status: AC
  Administered 2015-10-26: 0.4 mg via INTRAVENOUS
  Filled 2015-10-26: qty 1

## 2015-10-26 MED ORDER — SODIUM CHLORIDE 0.9 % IV SOLN
INTRAVENOUS | Status: DC
  Administered 2015-10-26: 18:00:00 via INTRAVENOUS

## 2015-10-26 MED ORDER — POLYVINYL ALCOHOL 1.4 % OP SOLN
1.0000 [drp] | Freq: Four times a day (QID) | OPHTHALMIC | Status: DC | PRN
  Filled 2015-10-26: qty 15

## 2015-10-26 MED ORDER — SODIUM CHLORIDE 0.9 % IV SOLN
25.0000 ug/h | INTRAVENOUS | Status: DC
  Administered 2015-10-26: 100 ug/h via INTRAVENOUS
  Filled 2015-10-26: qty 50

## 2015-10-26 MED ORDER — ACETAMINOPHEN 325 MG PO TABS
650.0000 mg | ORAL_TABLET | Freq: Four times a day (QID) | ORAL | Status: DC | PRN
Start: 2015-10-26 — End: 2015-10-27

## 2015-10-26 MED ORDER — GLYCOPYRROLATE 0.2 MG/ML IJ SOLN: 0.2000 mg | INTRAMUSCULAR | Status: DC | PRN

## 2015-10-26 MED ORDER — FENTANYL BOLUS VIA INFUSION
50.0000 ug | INTRAVENOUS | Status: DC | PRN
  Administered 2015-10-26: 50 ug via INTRAVENOUS
  Filled 2015-10-26: qty 200

## 2015-10-26 MED ORDER — SODIUM CHLORIDE 0.9 % IV SOLN
10.0000 mg/h | INTRAVENOUS | Status: DC
  Administered 2015-10-26: 10 mg/h via INTRAVENOUS
  Filled 2015-10-26: qty 10

## 2015-10-28 LAB — CULTURE, BLOOD (ROUTINE X 2)
Culture: NO GROWTH
Culture: NO GROWTH

## 2015-11-08 NOTE — Progress Notes (Signed)
   10/29/2015 1500  Clinical Encounter Type  Visited With Family;Patient and family together  Visit Type Patient actively dying;Initial;Psychological support;Social support  Referral From Physician  Spiritual Encounters  Spiritual Needs Prayer;Emotional;Grief support  Ch responded to consult from MD; CH met with RN and family and then offered spiritual support and prayer at pt bedside; CH available as needed. 3:10 PM Michael I Gisser  

## 2015-11-08 NOTE — Care Management Note (Signed)
Case Management Note  Patient Details  Name: Georgeanna Harrisonris A Curling MRN: [id number] Date of Birth: 05/02/1919  Subjective/Objective:    Patient expired.                Action/Plan:   Expected Discharge Date:                  Expected Discharge Plan:  Skilled Nursing Facility  In-House Referral:     Discharge planning Services  CM Consult  Post Acute Care Choice:    Choice offered to:     DME Arranged:    DME Agency:     HH Arranged:    HH Agency:     Status of Service:  Completed, signed off  Medicare Important Message Given:  Other (see comment) Date Medicare IM Given:    Medicare IM give by:    Date Additional Medicare IM Given:    Additional Medicare Important Message give by:     If discussed at Long Length of Stay Meetings, dates discussed:    Additional Comments:  Leone Havenaylor, Rubie Ficco Clinton, RN 10/27/2015, 11:55 AM

## 2015-11-08 NOTE — Progress Notes (Signed)
Pt lethargic when woken up this morning.  Unable to follow commands, speak, or maintain eye contact.  Pt was responsive last night, A&O x3, confused current year with birth date.  Dr. Janee Mornhompson on call notified on Pt decreased responsiveness.  MD will assess this AM.  Daughter is bedside.  Will continue to monitor.

## 2015-11-08 NOTE — Progress Notes (Signed)
Patient ID: Georgeanna HarrisonIris A Kratochvil, female   DOB: 02/15/1919, [age] y.o.   MRN: [id number]   LOS: 3 days   Subjective: Much less responsive this morning. Family reports she was normal last night.   Objective: Vital signs in last 24 hours: Temp:  [97.3 F (36.3 C)-98.2 F (36.8 C)] 98.1 F (36.7 C) (04/18 0326) Pulse Rate:  [64-98] 77 (04/18 0715) Resp:  [10-21] 21 (04/18 0715) BP: (76-138)/(35-50) 95/43 mmHg (04/18 0715) SpO2:  [97 %-100 %] 97 % (04/18 0715) Last BM Date: 10/22/15   Laboratory  CBC  Recent Labs  10/24/15 0353 10/25/15 0405  WBC 6.5 7.4  HGB 9.9* 10.9*  HCT 32.8* 37.5  PLT 150 162   BMET  Recent Labs  10/25/15 0405 Nov 06, 2015 0440  NA 147* 146*  K 4.0 4.4  CL 106 105  CO2 34* 33*  GLUCOSE 106* 98  BUN 16 27*  CREATININE 0.88 1.13*  CALCIUM 8.5* 8.7*    Physical Exam General appearance: no distress Resp: wheezes RUL Cardio: regular rate and rhythm GI: normal findings: bowel sounds normal and soft, non-tender  Neuro: E3V2M4=9, pupils = but minimally reactive if at all   Assessment/Plan: Ground-level fall. C1 fracture-acute minimally displaced fracture through anterior posterior arches of C1.Collar. Displaced right orbital floor fracture-- per Dr. Ulice Boldillingham Nondisplaced nasal bone fracture bilaterally Periorbital laceration, repaired by Dr. Dione BoozeGroat Multilevel degenerative disc disease and cervical spondylosis Right inferior pubic ramus fracture, minimally displaced ABL anemia -- Improved Hypoxia -- Appreciate pulmonary consult Altered mental status -- Will give Narcan though given her tolerance narcosis seems unlikely. Check UA and CXR (pt prone to UTI's, just finished treatment for one at start of this admission). Stop tramadol (usually doesn't cause catatonia but pt has hallucinated on it in the past per family). If those measures fail to turn up anything will check head CT though I see no lateralizing signs. Multiple medical problems -- Home meds FEN  -- Renal fxn worsened a bit, increase IVF. RN reports pt not been voiding, will place foley given that and AMS. VTE -- SCD's, Lovenox Dispo -- PT/OT --> SNF. Continue SDU with AMS.    Freeman CaldronMichael J. Violet Seabury, PA-C Pager: [phone] General Trauma PA Pager: [phone]  06-29-16

## 2015-11-08 NOTE — Progress Notes (Signed)
Patient's time of death pronounced at 1835 by Sherene SiresBettie Jo Antonieta Slaven, RN and Buckner Maltaracie Davis, RN. Patient had her family surrounding her as she died. Dr. Corliss Skainssuei was paged along with palliative care to notify of death. Dr. Corliss Skainssuei returned page and reported that Dr. Lindie SpruceWyatt will be signing the death certificate tomorrow.

## 2015-11-08 NOTE — Progress Notes (Signed)
Patient has been experiencing pain. Family request for comfort measures. Pt's oxygen saturation dropped to 68-76% with nasal canula at 4L. Pt's family reported they did not want anything further than the nasal canula. Notified trauma PA and also paged palliative team's NP.

## 2015-11-08 NOTE — Progress Notes (Addendum)
Pt has continued to struggle today with decreased mental status, agitation, and hypoxia. Her children discussed amongst themselves and would like to pursue comfort care only. I discussed with them what this would look like and the expected death of their mother in a few days. They all think that this is the best for her and are all in agreement. I have instituted the withdrawal of care order set and will transfer her to the palliative care floor.    [name], PA-C Pager: [phone] General Trauma PA Pager: [phone] Confusion secondary to delirium, possible metabolic.  Family concerned.  This patient has been seen and I agree with the findings and treatment plan.  Marta LamasJames O. Gae BonWyatt, III, MD, FACS [phone] (pager) [phone] (direct pager) Trauma Surgeon

## 2015-11-08 NOTE — Progress Notes (Addendum)
IV fluids Fentanyl and Versed wasted in sink with Beryle QuantMatt Damauri Minion, RN  Witnessed by Beryle QuantMatt Javon Hupfer, RN

## 2015-11-08 NOTE — Progress Notes (Signed)
Daily Progress Note   Patient Name: Julia HarrisonIris A Black       Date: 2015/12/03 DOB: 06/12/1919  Age: 82 y.o. MRN#: [id number] Attending Physician: Trauma Md, MD Primary Care Physician: Pincus SanesStacy J Burns, MD Admit Date: 10/18/2015  Reason for Consultation/Follow-up: Terminal Care- Acute decline overnight Length of Stay: 3 days  Palliative Performance Scale: 10%   Vital Signs: BP 95/43 mmHg  Pulse 77  Temp(Src) 99.2 F (37.3 C) (Axillary)  Resp 21  Ht 5\' 3"  (1.6 m)  Wt 51.4 kg (113 lb 5.1 oz)  BMI 20.08 kg/m2  SpO2 97% SpO2: SpO2: 97 % O2 Device: O2 Device: Nasal Cannula O2 Flow Rate: O2 Flow Rate (L/min): 3 L/min  Intake/output summary:  Intake/Output Summary (Last 24 hours) at 2015/09/24 1627 Last data filed at 2015/09/24 0700  Gross per 24 hour  Intake   1120 ml  Output      0 ml  Net   1120 ml   LBM:   Baseline Weight: Weight: 49.896 kg (110 lb) Most recent weight: Weight: 51.4 kg (113 lb 5.1 oz)  Physical Exam: Diffuse Echymoses, Unresponsive, periods of apnea          Additional Data Reviewed: Recent Labs     10/24/15  0353  10/25/15  0405  2015/09/24  0440  WBC  6.5  7.4   --   HGB  9.9*  10.9*   --   PLT  150  162   --   NA  144  147*  146*  BUN  19  16  27*  CREATININE  0.72  0.88  1.13*     Problem List:  Patient Active Problem List   Diagnosis Date Noted  . Altered mental status 02017/05/26  . Frequent falls 10/24/2015  .  hypoxia (HCC) 10/24/2015  . Facial trauma with right orbital blow out fx. Plastic and Opthalmolgy following 10/24/2015  . Palliative care encounter   . Fall 11/07/2015  . Wound of right leg 08/27/2015  . Loose stools 05/25/2015  . Pain in the chest 05/25/2015  . Leg edema, left 05/25/2015  . Lump of skin 05/05/2015  . Constipation 05/05/2015  . Diastolic dysfunction with heart failure (HCC) 05/05/2015  . Hyperglycemia 06/10/2014  . NSTEMI (non-ST elevated myocardial infarction) (HCC)   . Cerebrovascular disease 12/11/2013  . Knee  osteoarthritis   . Macular degeneration 06/28/2011  . Atrial fibrillation (HCC) 09/27/2010  . LOW BACK PAIN 03/17/2010  . EDEMA 01/17/2010  . FRACTURE, PELVIS, RIGHT 01/03/2010  . HIP FRACTURE, LEFT 10/03/2009  . DEPRESSION 07/15/2009  . ALLERGIC RHINITIS 07/15/2009  . GERD 07/15/2009  . HYPERLIPIDEMIA 07/14/2009  . Essential hypertension 07/14/2009  . Coronary atherosclerosis 07/14/2009  . Spinal stenosis 07/14/2009     Palliative Care Assessment & Plan   Received call from RN requesting follow-up for acute decline overnight. Upon my arrival patient has been transitioned to full comfort care by primary team. I provided support and answered all of their questions. Additionally I have adjusted EOL meds and will use the EOL /Palliative order set.  This patient is too unstable to transport- >30 second periods of apnea- high risk for death in transit incident.   ANTICIPATE HOSPITAL DEATH- HOURS, less likely days.  Time In: 4PM Time Out: 430PM Total Time 30 Prolonged Time Billed no    Greater than 50%  of this time was spent counseling and coordinating care related to the above assessment and plan.   Edsel PetrinElizabeth L Riyan Gavina, DO  10/13/2015, 4:27 PM  Please contact Palliative Medicine Team phone at [phone] for questions and concerns.

## 2015-11-08 NOTE — Clinical Documentation Improvement (Signed)
Trauma  Can the Nutritional diagnosis of Malnutrition be further specified?   Document Severity - Severe(third degree), Moderate (second degree), Mild (first degree)  Other condition  Unable to clinically determine  Document any associated diagnoses/conditions  Supporting Information: :  10/25/15: Nutrition eval..[name]."Non-severe (moderate) malnutrition in context of social or environmental circumstances"..."INTERVENTION: Ensure Enlive PO TID, each supplement provides 350 kcal and 20 grams of protein"...  Please exercise your independent, professional judgment when responding. A specific answer is not anticipated or expected.  Thank You, Toribio Harbourphelia R Eathen Budreau, RN, BSN, CCDS Certified Clinical Documentation Specialist [place]: Health Information Management [phone]

## 2015-11-08 NOTE — Discharge Summary (Signed)
Physician Death Summary  Patient ID: Georgeanna Harrisonris A Mcelwain MRN: [id number] DOB/AGE: 80/03/1919 82 y.o.  Admit date: Oct 05, 2015 Discharge date: 10/15/2015  Discharge Diagnoses Patient Active Problem List   Diagnosis Date Noted  . Altered mental status 10/31/2015  . Frequent falls 10/24/2015  .  hypoxia (HCC) 10/24/2015  . Facial trauma with right orbital blow out fx. Plastic and Opthalmolgy following 10/24/2015  . Palliative care encounter   . Fall 0Mar 28, 2017  . Wound of right leg 08/27/2015  . Loose stools 05/25/2015  . Pain in the chest 05/25/2015  . Leg edema, left 05/25/2015  . Lump of skin 05/05/2015  . Constipation 05/05/2015  . Diastolic dysfunction with heart failure (HCC) 05/05/2015  . Hyperglycemia 06/10/2014  . NSTEMI (non-ST elevated myocardial infarction) (HCC)   . Cerebrovascular disease 12/11/2013  . Knee osteoarthritis   . Macular degeneration 06/28/2011  . Atrial fibrillation (HCC) 09/27/2010  . LOW BACK PAIN 03/17/2010  . EDEMA 01/17/2010  . FRACTURE, PELVIS, RIGHT 01/03/2010  . HIP FRACTURE, LEFT 10/03/2009  . DEPRESSION 07/15/2009  . ALLERGIC RHINITIS 07/15/2009  . GERD 07/15/2009  . HYPERLIPIDEMIA 07/14/2009  . Essential hypertension 07/14/2009  . Coronary atherosclerosis 07/14/2009  . Spinal stenosis 07/14/2009    Consultants Dr. Fabian SharpScott Groat for ophthalmology  Dr. Foster Simpsonlaire Dillingham for plastic surgery  Dr. Levy Pupaobert Byrum for critical care medicine  Dr. Romie MinusGene Freeman for palliative care  Dr. Altamease OilerAndy Pool for neurosurgery   Procedures 4/15 -- Repair of right eyelid lacerations by Dr. Dione BoozeGroat   HPI: Ladon presented after a fall in her bathroom sometime during the night. She was found in the hall trying to reach her call bell; she lives in an independent living facility. She was checked on in the morning and found down.She had significant facial edema and complaints of pain. She had multiple comorbidities including coronary artery disease and spinal stenosis for  which she was on a Duragesic patch and hydrocodone. She also had recent urinary tract infections that had been treated. His workup included CT scans of the head, face, cervical spine, abdomen, and pelvis as well as extremity x-rays which showed the above-mentioned injuries. She was admitted by the trauma service and neurosurgery and ophthalmology were consulted. He repaired her eyelid laceration in the ED.    Hospital Course: Facial surgery was also consulted. Surgery was offered but was not considered absolutely necessary. Neurosurgery recommended a cervical collar for her neck fracture. The day after admission she began to have problems with her oxygen saturations and critical care medicine was consulted. This improved and she briefly seemed to make progress but her mental status declined the next day. This responded somewhat to Narcan and it was thought to be a medication issue, at least in part. Further workup was under way but faced with the prospect of further suffering while attempting to balance pain control and mental status the family decided to just make the patient comfortable. She went on to die later that day.    Signed: Freeman CaldronMichael J. Eviana Sibilia, PA-C Pager: [phone] General Trauma [place] Pager: [phone] 11/05/2015, 1:42 PM

## 2015-11-08 NOTE — Care Management Important Message (Signed)
Important Message  Patient Details  Name: Julia Black MRN: [id number] Date of Birth: 04/19/1919   Medicare Important Message Given:  Other (see comment)    [name] 2016/03/23, 11:05 AM

## 2015-11-08 NOTE — Clinical Documentation Improvement (Signed)
Trauma Please update your documentation within the medical record to reflect your response to this query. Thank you  Can the diagnosis of altered mental status be further specified?   Confusion/delirium (including drug induced)  Encephalopathy - Alcoholic, Anoxic/Hypoxia, Drug Induced/Toxic (specify drug), Hepatic, Hypertensive, Hypoglycemic, Metabolic/Septic, Traumatic/post concussive, Wernicke, Other  Other  Clinically Undetermined  Document any associated diagnoses/conditions.  Supporting Information: 10/23/2015 prog note.[name].[name]."Altered mental status -- Will give Narcan though given her tolerance narcosis seems unlikely..." Noted attestation.[name].[name]."Better after given Narcan. Probably overly treated for pain.".[name].[name].  Please exercise your independent, professional judgment when responding. A specific answer is not anticipated or expected.  Thank You,  Toribio Harbourphelia R Kysean Sweet, RN, BSN, CCDS Certified Clinical Documentation Specialist [place]: Health Information Management [phone]

## 2015-11-08 DEATH — deceased

## 2016-07-21 NOTE — Telephone Encounter (Signed)
Made in error
